# Patient Record
Sex: Male | Born: 1950 | Race: Black or African American | Hispanic: No | Marital: Single | Smoking: Current every day smoker
Health system: Southern US, Community
[De-identification: ages and names within clinical notes are randomized; demographics above are authoritative.]

## PROBLEM LIST (undated history)

## (undated) DIAGNOSIS — E119 Type 2 diabetes mellitus without complications: Secondary | ICD-10-CM

## (undated) DIAGNOSIS — I639 Cerebral infarction, unspecified: Secondary | ICD-10-CM

## (undated) DIAGNOSIS — I10 Essential (primary) hypertension: Secondary | ICD-10-CM

---

## 2020-03-28 ENCOUNTER — Encounter: Payer: Self-pay | Admitting: Critical Care Medicine

## 2020-03-29 NOTE — Progress Notes (Unsigned)
Patient ID: Reginald Rodriguez, male   DOB: February 06, 1951, 69 y.o.   MRN: [id number] This is a 69 year old male who is seen in the [facility] and is a new patient to our area.  [facility] the [facility] ago.  He moved up from [place] where his primary care provider is Dr. Tammi Klippel and he also has a diabetologist Pecola Leisure at [facility].  This patient has had longstanding diabetes hypertension previous stroke.  This patient states his current medications include Tresiba 60 units daily, Humalog 20 units 3 times daily as needed for blood sugars greater than 250, amlodipine 5 mg daily, Lipitor 10 mg daily, hydrocal thiazide 25 mg daily, gabapentin 100 mg twice daily, lactulose 15 mL daily, fenofibrate 54 mg daily.  The patient does not have a glucometer at this time.  He is allergic to penicillin.  He states he has had some headaches and some degree of constipation at this time.  On exam blood sugar is greater than 400 on meter saturation 98% pulse 79 blood pressure is 150/100  Chest is clear abdomen benign benign cardiac exam was unremarkable  Blood sugar greater than 400 on CBG  Impression is that of prior stroke, hypertension, diabetes type 2  Plan will be [facility] and I will obtain records from his clinic and send a record release which she signed at this visit we will also attempt a get him into our clinic for further follow-up

## 2020-04-15 ENCOUNTER — Ambulatory Visit: Payer: Medicare HMO | Attending: Critical Care Medicine | Admitting: Critical Care Medicine

## 2020-04-15 ENCOUNTER — Other Ambulatory Visit: Payer: Self-pay | Admitting: Critical Care Medicine

## 2020-04-15 ENCOUNTER — Ambulatory Visit (HOSPITAL_BASED_OUTPATIENT_CLINIC_OR_DEPARTMENT_OTHER): Payer: Medicare HMO | Admitting: Pharmacist

## 2020-04-15 ENCOUNTER — Encounter: Payer: Self-pay | Admitting: Critical Care Medicine

## 2020-04-15 ENCOUNTER — Other Ambulatory Visit: Payer: Self-pay

## 2020-04-15 VITALS — BP 125/86 | HR 94 | Temp 98.4°F | Resp 16 | Ht 67.0 in | Wt 231.6 lb

## 2020-04-15 DIAGNOSIS — E1165 Type 2 diabetes mellitus with hyperglycemia: Secondary | ICD-10-CM | POA: Diagnosis not present

## 2020-04-15 DIAGNOSIS — K089 Disorder of teeth and supporting structures, unspecified: Secondary | ICD-10-CM | POA: Diagnosis not present

## 2020-04-15 DIAGNOSIS — I10 Essential (primary) hypertension: Secondary | ICD-10-CM | POA: Insufficient documentation

## 2020-04-15 DIAGNOSIS — I6930 Unspecified sequelae of cerebral infarction: Secondary | ICD-10-CM | POA: Insufficient documentation

## 2020-04-15 DIAGNOSIS — Z1159 Encounter for screening for other viral diseases: Secondary | ICD-10-CM

## 2020-04-15 DIAGNOSIS — Z23 Encounter for immunization: Secondary | ICD-10-CM

## 2020-04-15 LAB — POCT GLYCOSYLATED HEMOGLOBIN (HGB A1C): HbA1c, POC (controlled diabetic range): 7.7 % — AB (ref 0.0–7.0)

## 2020-04-15 LAB — GLUCOSE, POCT (MANUAL RESULT ENTRY): POC Glucose: 138 mg/dl — AB (ref 70–99)

## 2020-04-15 MED ORDER — AMLODIPINE BESYLATE 5 MG PO TABS
5.0000 mg | ORAL_TABLET | Freq: Every day | ORAL | 1 refills | Status: DC
Start: 2020-04-15 — End: 2020-04-30

## 2020-04-15 MED ORDER — NOVOLOG FLEXPEN 100 UNIT/ML ~~LOC~~ SOPN
PEN_INJECTOR | SUBCUTANEOUS | Status: DC
Start: 2020-04-15 — End: 2020-05-15

## 2020-04-15 MED ORDER — ATORVASTATIN CALCIUM 10 MG PO TABS
10.0000 mg | ORAL_TABLET | Freq: Every day | ORAL | 2 refills | Status: DC
Start: 2020-04-15 — End: 2020-06-27

## 2020-04-15 MED ORDER — GABAPENTIN 100 MG PO CAPS
100.0000 mg | ORAL_CAPSULE | Freq: Two times a day (BID) | ORAL | 1 refills | Status: DC
Start: 2020-04-15 — End: 2020-06-05

## 2020-04-15 MED ORDER — CHLORTHALIDONE 25 MG PO TABS
25.0000 mg | ORAL_TABLET | Freq: Every day | ORAL | 1 refills | Status: DC
Start: 2020-04-15 — End: 2020-04-30

## 2020-04-15 MED ORDER — FENOFIBRATE 54 MG PO TABS
54.0000 mg | ORAL_TABLET | Freq: Every day | ORAL | 1 refills | Status: DC
Start: 2020-04-15 — End: 2020-06-27

## 2020-04-15 NOTE — Assessment & Plan Note (Signed)
Uncontrolled type 2 diabetes with hyperglycemia we will increase lunch dose to 24 units of NovoLog continue 20 units in the morning in the evening also continue Tresiba 60 units daily

## 2020-04-15 NOTE — Assessment & Plan Note (Signed)
Patient given resources for dental care

## 2020-04-15 NOTE — Progress Notes (Signed)
[id number]

## 2020-04-15 NOTE — Patient Instructions (Addendum)
Future refills on all your medicines will go to our pharmacy  Complete primary care blood draw done today and also urine for microalbumin  Change novolog to 20 units at breakfast, 24 units at lunch  20 units at dinner  Take tresiba at bedtime 60 units  Follow a healthy diet and reduce your tobacco intake  A Rollator will be obtained  Flu vaccine and pneumonia vaccine was given at this visit  Your Covid booster with Pfizer vaccine will be due in mid November  Return to see Dr. Joya Gaskins in this clinic 2 months   Tobacco Use Disorder Tobacco use disorder (TUD) occurs when a person craves, seeks, and uses tobacco, regardless of the consequences. This disorder can cause problems with mental and physical health. It can affect your ability to have healthy relationships, and it can keep you from meeting your responsibilities at work, home, or school. Tobacco may be:  Smoked as a cigarette or cigar.  Inhaled using e-cigarettes.  Smoked in a pipe or hookah.  Chewed as smokeless tobacco.  Inhaled into the nostrils as snuff. Tobacco products contain a dangerous chemical called nicotine, which is very addictive. Nicotine triggers hormones that make the body feel stimulated and works on areas of the brain that make you feel good. These effects can make it hard for people to quit nicotine. Tobacco contains many other unsafe chemicals that can damage almost every organ in the body. Smoking tobacco also puts others in danger due to fire risk and possible health problems caused by breathing in secondhand smoke. What are the signs or symptoms? Symptoms of TUD may include:  Being unable to slow down or stop your tobacco use.  Spending an abnormal amount of time getting or using tobacco.  Craving tobacco. Cravings may last for up to 6 months after quitting.  Tobacco use that: ? Interferes with your work, school, or home life. ? Interferes with your personal and social relationships. ? Makes  you give up activities that you once enjoyed or found important.  Using tobacco even though you know that it is: ? Dangerous or bad for your health or someone else's health. ? Causing problems in your life.  Needing more and more of the substance to get the same effect (developing tolerance).  Experiencing unpleasant symptoms if you do not use the substance (withdrawal). Withdrawal symptoms may include: ? Depressed, anxious, or irritable mood. ? Difficulty concentrating. ? Increased appetite. ? Restlessness or trouble sleeping.  Using the substance to avoid withdrawal. How is this diagnosed? This condition may be diagnosed based on:  Your current and past tobacco use. Your health care provider may ask questions about how your tobacco use affects your life.  A physical exam. You may be diagnosed with TUD if you have at least two symptoms within a 59-month period. How is this treated? This condition is treated by stopping tobacco use. Many people are unable to quit on their own and need help. Treatment may include:  Nicotine replacement therapy (NRT). NRT provides nicotine without the other harmful chemicals in tobacco. NRT gradually lowers the dosage of nicotine in the body and reduces withdrawal symptoms. NRT is available as: ? Over-the-counter gums, lozenges, and skin patches. ? Prescription mouth inhalers and nasal sprays.  Medicine that acts on the brain to reduce cravings and withdrawal symptoms.  A type of talk therapy that examines your triggers for tobacco use, how to avoid them, and how to cope with cravings (behavioral therapy).  Hypnosis. This may  help with withdrawal symptoms.  Joining a support group for others coping with TUD. The best treatment for TUD is usually a combination of medicine, talk therapy, and support groups. Recovery can be a long process. Many people start using tobacco again after stopping (relapse). If you relapse, it does not mean that treatment  will not work. Follow these instructions at home:  Lifestyle  Do not use any products that contain nicotine or tobacco, such as cigarettes and e-cigarettes.  Avoid things that trigger tobacco use as much as you can. Triggers include people and situations that usually cause you to use tobacco.  Avoid drinks that contain caffeine, including coffee. These may worsen some withdrawal symptoms.  Find ways to manage stress. Wanting to smoke may cause stress, and stress can make you want to smoke. Relaxation techniques such as deep breathing, meditation, and yoga may help.  Attend support groups as needed. These groups are an important part of long-term recovery for many people. General instructions  Take over-the-counter and prescription medicines only as told by your health care provider.  Check with your health care provider before taking any new prescription or over-the-counter medicines.  Decide on a friend, family member, or smoking quit-line (such as 1-800-QUIT-NOW in the [place] that you can call or text when you feel the urge to smoke or when you need help coping with cravings.  Keep all follow-up visits as told by your health care provider and therapist. This is important. Contact a health care provider if:  You are not able to take your medicines as prescribed.  Your symptoms get worse, even with treatment. Summary  Tobacco use disorder (TUD) occurs when a person craves, seeks, and uses tobacco regardless of the consequences.  This condition may be diagnosed based on your current and past tobacco use and a physical exam.  Many people are unable to quit on their own and need help. Recovery can be a long process.  The most effective treatment for TUD is usually a combination of medicine, talk therapy, and support groups. This information is not intended to replace advice given to you by your health care provider. Make sure you discuss any questions you have with your health care  provider. Document Revised: 06/23/2017 Document Reviewed: 06/23/2017 Elsevier Patient Education  [place].  Diabetes Mellitus and Nutrition, Adult When you have diabetes (diabetes mellitus), it is very important to have healthy eating habits because your blood sugar (glucose) levels are greatly affected by what you eat and drink. Eating healthy foods in the appropriate amounts, at about the same times every day, can help you:  Control your blood glucose.  Lower your risk of heart disease.  Improve your blood pressure.  Reach or maintain a healthy weight. Every person with diabetes is different, and each person has different needs for a meal plan. Your health care provider may recommend that you work with a diet and nutrition specialist (dietitian) to make a meal plan that is best for you. Your meal plan may vary depending on factors such as:  The calories you need.  The medicines you take.  Your weight.  Your blood glucose, blood pressure, and cholesterol levels.  Your activity level.  Other health conditions you have, such as heart or kidney disease. How do carbohydrates affect me? Carbohydrates, also called carbs, affect your blood glucose level more than any other type of food. Eating carbs naturally raises the amount of glucose in your blood. Carb counting is a method for  keeping track of how many carbs you eat. Counting carbs is important to keep your blood glucose at a healthy level, especially if you use insulin or take certain oral diabetes medicines. It is important to know how many carbs you can safely have in each meal. This is different for every person. Your dietitian can help you calculate how many carbs you should have at each meal and for each snack. Foods that contain carbs include:  Bread, cereal, rice, pasta, and crackers.  Potatoes and corn.  Peas, beans, and lentils.  Milk and yogurt.  Fruit and juice.  Desserts, such as cakes, cookies, ice  cream, and candy. How does alcohol affect me? Alcohol can cause a sudden decrease in blood glucose (hypoglycemia), especially if you use insulin or take certain oral diabetes medicines. Hypoglycemia can be a life-threatening condition. Symptoms of hypoglycemia (sleepiness, dizziness, and confusion) are similar to symptoms of having too much alcohol. If your health care provider says that alcohol is safe for you, follow these guidelines:  Limit alcohol intake to no more than 1 drink per day for nonpregnant women and 2 drinks per day for men. One drink equals 12 oz of beer, 5 oz of wine, or 1 oz of hard liquor.  Do not drink on an empty stomach.  Keep yourself hydrated with water, diet soda, or unsweetened iced tea.  Keep in mind that regular soda, juice, and other mixers may contain a lot of sugar and must be counted as carbs. What are tips for following this plan?  Reading food labels  Start by checking the serving size on the "Nutrition Facts" label of packaged foods and drinks. The amount of calories, carbs, fats, and other nutrients listed on the label is based on one serving of the item. Many items contain more than one serving per package.  Check the total grams (g) of carbs in one serving. You can calculate the number of servings of carbs in one serving by dividing the total carbs by 15. For example, if a food has 30 g of total carbs, it would be equal to 2 servings of carbs.  Check the number of grams (g) of saturated and trans fats in one serving. Choose foods that have low or no amount of these fats.  Check the number of milligrams (mg) of salt (sodium) in one serving. Most people should limit total sodium intake to less than 2,300 mg per day.  Always check the nutrition information of foods labeled as "low-fat" or "nonfat". These foods may be higher in added sugar or refined carbs and should be avoided.  Talk to your dietitian to identify your daily goals for nutrients listed on  the label. Shopping  Avoid buying canned, premade, or processed foods. These foods tend to be high in fat, sodium, and added sugar.  Shop around the outside edge of the grocery store. This includes fresh fruits and vegetables, bulk grains, fresh meats, and fresh dairy. Cooking  Use low-heat cooking methods, such as baking, instead of high-heat cooking methods like deep frying.  Cook using healthy oils, such as olive, canola, or sunflower oil.  Avoid cooking with butter, cream, or high-fat meats. Meal planning  Eat meals and snacks regularly, preferably at the same times every day. Avoid going long periods of time without eating.  Eat foods high in fiber, such as fresh fruits, vegetables, beans, and whole grains. Talk to your dietitian about how many servings of carbs you can eat at each meal.  Eat 4-6 ounces (oz) of lean protein each day, such as lean meat, chicken, fish, eggs, or tofu. One oz of lean protein is equal to: ? 1 oz of meat, chicken, or fish. ? 1 egg. ?  cup of tofu.  Eat some foods each day that contain healthy fats, such as avocado, nuts, seeds, and fish. Lifestyle  Check your blood glucose regularly.  Exercise regularly as told by your health care provider. This may include: ? 150 minutes of moderate-intensity or vigorous-intensity exercise each week. This could be brisk walking, biking, or water aerobics. ? Stretching and doing strength exercises, such as yoga or weightlifting, at least 2 times a week.  Take medicines as told by your health care provider.  Do not use any products that contain nicotine or tobacco, such as cigarettes and e-cigarettes. If you need help quitting, ask your health care provider.  Work with a Social worker or diabetes educator to identify strategies to manage stress and any emotional and social challenges. Questions to ask a health care provider  Do I need to meet with a diabetes educator?  Do I need to meet with a dietitian?  What  number can I call if I have questions?  When are the best times to check my blood glucose? Where to find more information:  American Diabetes Association: diabetes.org  Academy of Nutrition and Dietetics: www.eatright.[employer] of Diabetes and Digestive and Kidney Diseases (NIH): [employer] Summary  A healthy meal plan will help you control your blood glucose and maintain a healthy lifestyle.  Working with a diet and nutrition specialist (dietitian) can help you make a meal plan that is best for you.  Keep in mind that carbohydrates (carbs) and alcohol have immediate effects on your blood glucose levels. It is important to count carbs and to use alcohol carefully. This information is not intended to replace advice given to you by your health care provider. Make sure you discuss any questions you have with your health care provider. Document Revised: 06/18/2017 Document Reviewed: 08/10/2016 Elsevier Patient Education  [place].  Pneumococcal Polysaccharide Vaccine (PPSV23): What You Need to Know 1. Why get vaccinated? Pneumococcal polysaccharide vaccine (PPSV23) can prevent pneumococcal disease. Pneumococcal disease refers to any illness caused by pneumococcal bacteria. These bacteria can cause many types of illnesses, including pneumonia, which is an infection of the lungs. Pneumococcal bacteria are one of the most common causes of pneumonia. Besides pneumonia, pneumococcal bacteria can also cause:  Ear infections  Sinus infections  Meningitis (infection of the tissue covering the brain and spinal cord)  Bacteremia (bloodstream infection) Anyone can get pneumococcal disease, but children under 48 years of age, people with certain medical conditions, adults 72 years or older, and cigarette smokers are at the highest risk. Most pneumococcal infections are mild. However, some can result in long-term problems, such as brain damage or hearing loss.  Meningitis, bacteremia, and pneumonia caused by pneumococcal disease can be fatal. 2. PPSV23 PPSV23 protects against 23 types of bacteria that cause pneumococcal disease. PPSV23 is recommended for:  All adults 13 years or older,  Anyone 2 years or older with certain medical conditions that can lead to an increased risk for pneumococcal disease. Most people need only one dose of PPSV23. A second dose of PPSV23, and another type of pneumococcal vaccine called PCV13, are recommended for certain high-risk groups. Your health care provider can give you more information. People 65 years or older should get a dose of PPSV23 even if  they have already gotten one or more doses of the vaccine before they turned 73. 3. Talk with your health care provider Tell your vaccine provider if the person getting the vaccine:  Has had an allergic reaction after a previous dose of PPSV23, or has any severe, life-threatening allergies. In some cases, your health care provider may decide to postpone PPSV23 vaccination to a future visit. People with minor illnesses, such as a cold, may be vaccinated. People who are moderately or severely ill should usually wait until they recover before getting PPSV23. Your health care provider can give you more information. 4. Risks of a vaccine reaction  Redness or pain where the shot is given, feeling tired, fever, or muscle aches can happen after PPSV23. People sometimes faint after medical procedures, including vaccination. Tell your provider if you feel dizzy or have vision changes or ringing in the ears. As with any medicine, there is a very remote chance of a vaccine causing a severe allergic reaction, other serious injury, or death. 5. What if there is a serious problem? An allergic reaction could occur after the vaccinated person leaves the clinic. If you see signs of a severe allergic reaction (hives, swelling of the face and throat, difficulty breathing, a fast heartbeat,  dizziness, or weakness), call 9-1-1 and get the person to the nearest hospital. For other signs that concern you, call your health care provider. Adverse reactions should be reported to the Vaccine Adverse Event Reporting System (VAERS). Your health care provider will usually file this report, or you can do it yourself. Visit the VAERS website at www.vaers.[employer].es or call 559-261-6610. VAERS is only for reporting reactions, and VAERS staff do not give medical advice. 6. How can I learn more?  Ask your health care provider.  Call your local or state health department.  Contact the Centers for Disease Control and Prevention (CDC): ? Call (712)838-7553 (1-800-CDC-INFO) or ? Visit CDC's website at http://hunter.com/ CDC Vaccine Information Statement PPSV23 Vaccine (05/18/2018) This information is not intended to replace advice given to you by your health care provider. Make sure you discuss any questions you have with your health care provider. Document Revised: 10/25/2018 Document Reviewed: 02/15/2018 Elsevier Patient Education  [place].   Influenza Virus Vaccine injection (Fluarix) What is this medicine? INFLUENZA VIRUS VACCINE (in floo EN zuh VAHY ruhs vak SEEN) helps to reduce the risk of getting influenza also known as the flu. This medicine may be used for other purposes; ask your health care provider or pharmacist if you have questions. COMMON BRAND NAME(S): Fluarix, Fluzone What should I tell my health care provider before I take this medicine? They need to know if you have any of these conditions: bleeding disorder like hemophilia fever or infection Guillain-Barre syndrome or other neurological problems immune system problems infection with the human immunodeficiency virus (HIV) or AIDS low blood platelet counts multiple sclerosis an unusual or allergic reaction to influenza virus vaccine, eggs, chicken proteins, latex, gentamicin, other medicines, foods, dyes or  preservatives pregnant or trying to get pregnant breast-feeding How should I use this medicine? This vaccine is for injection into a muscle. It is given by a health care professional. A copy of Vaccine Information Statements will be given before each vaccination. Read this sheet carefully each time. The sheet may change frequently. Talk to your pediatrician regarding the use of this medicine in children. Special care may be needed. Overdosage: If you think you have taken too much of this medicine contact a  poison control center or emergency room at once. NOTE: This medicine is only for you. Do not share this medicine with others. What if I miss a dose? This does not apply. What may interact with this medicine? chemotherapy or radiation therapy medicines that lower your immune system like etanercept, anakinra, infliximab, and adalimumab medicines that treat or prevent blood clots like warfarin phenytoin steroid medicines like prednisone or cortisone theophylline vaccines This list may not describe all possible interactions. Give your health care provider a list of all the medicines, herbs, non-prescription drugs, or dietary supplements you use. Also tell them if you smoke, drink alcohol, or use illegal drugs. Some items may interact with your medicine. What should I watch for while using this medicine? Report any side effects that do not go away within 3 days to your doctor or health care professional. Call your health care provider if any unusual symptoms occur within 6 weeks of receiving this vaccine. You may still catch the flu, but the illness is not usually as bad. You cannot get the flu from the vaccine. The vaccine will not protect against colds or other illnesses that may cause fever. The vaccine is needed every year. What side effects may I notice from receiving this medicine? Side effects that you should report to your doctor or health care professional as soon as possible: allergic  reactions like skin rash, itching or hives, swelling of the face, lips, or tongue Side effects that usually do not require medical attention (report to your doctor or health care professional if they continue or are bothersome): fever headache muscle aches and pains pain, tenderness, redness, or swelling at site where injected weak or tired This list may not describe all possible side effects. Call your doctor for medical advice about side effects. You may report side effects to FDA at 1-800-FDA-1088. Where should I keep my medicine? This vaccine is only given in a clinic, pharmacy, doctor's office, or other health care setting and will not be stored at home. NOTE: This sheet is a summary. It may not cover all possible information. If you have questions about this medicine, talk to your doctor, pharmacist, or health care provider.  2020 Elsevier/Gold Standard (2008-02-01 09:30:40)

## 2020-04-15 NOTE — Assessment & Plan Note (Signed)
Hypertension well-controlled this time continue current therapy

## 2020-04-15 NOTE — Progress Notes (Signed)
Subjective:    Patient ID: Reginald Rodriguez, male    DOB: [birth year], 68 y.o.   MRN: [id number]  03/28/20 This is a 69 year old male who is seen in the [facility] and is a new patient to our area.  He just arrived at the shelter 2 weeks ago.  He moved up from [place] where his primary care provider is Dr. Tammi Klippel and he also has a diabetologist Pecola Leisure at the [facility].  This patient has had longstanding diabetes hypertension previous stroke.  This patient states his current medications include Tresiba 60 units daily, Humalog 20 units 3 times daily as needed for blood sugars greater than 250, amlodipine 5 mg daily, Lipitor 10 mg daily, hydrocal thiazide 25 mg daily, gabapentin 100 mg twice daily, lactulose 15 mL daily, fenofibrate 54 mg daily.  The patient does not have a glucometer at this time.  He is allergic to penicillin.  He states he has had some headaches and some degree of constipation at this time.  On exam blood sugar is greater than 400 on meter saturation 98% pulse 79 blood pressure is 150/100  Chest is clear abdomen benign benign cardiac exam was unremarkable  Blood sugar greater than 400 on CBG  Impression is that of prior stroke, hypertension, diabetes type 2  Plan will be [facility] and I will obtain records from his clinic and send a record release which she signed at this visit we will also attempt a get him into our clinic for further follow-up  04/15/2020 Patient is seen today in the clinic for the first time having just moved from [place].  On arrival A1c is 7.7 blood sugar 138.  Longstanding history of hypertension diabetes prior stroke 3 years ago with residual defect with right upper and lower extremity weakness.  On arrival blood pressure is good at 125/86.  Patient uses a cane to ambulate is requesting a Rollator possible.  Patient needs primary care assessments including  hepatitis C assay.  He did have a colonoscopy 3 years ago was negative.  He smokes a pack every 2 days at the shelter.  He has no other new complaints.  History reviewed. No pertinent past medical history.   History reviewed. No pertinent family history.   Social History   Socioeconomic History  . Marital status: Single    Spouse name: Not on file  . Number of children: Not on file  . Years of education: Not on file  . Highest education level: Not on file  Occupational History  . Not on file  Tobacco Use  . Smoking status: Current Every Day Smoker  . Smokeless tobacco: Former Network engineer  . Vaping Use: Never used  Substance and Sexual Activity  . Alcohol use: Not Currently  . Drug use: Not Currently  . Sexual activity: Not on file  Other Topics Concern  . Not on file  Social History Narrative  . Not on file   Social Determinants of Health   Financial Resource Strain:   . Difficulty of Paying Living Expenses: Not on file  Food Insecurity:   . Worried About Charity fundraiser in the Last Year: Not on file  . Ran Out of Food in the Last Year: Not on file  Transportation Needs:   . Lack of Transportation (Medical): Not on file  . Lack of Transportation (Non-Medical): Not on file  Physical Activity:   .  Days of Exercise per Week: Not on file  . Minutes of Exercise per Session: Not on file  Stress:   . Feeling of Stress : Not on file  Social Connections:   . Frequency of Communication with Friends and Family: Not on file  . Frequency of Social Gatherings with Friends and Family: Not on file  . Attends Religious Services: Not on file  . Active Member of Clubs or Organizations: Not on file  . Attends Archivist Meetings: Not on file  . Marital Status: Not on file  Intimate Partner Violence:   . Fear of Current or Ex-Partner: Not on file  . Emotionally Abused: Not on file  . Physically Abused: Not on file  . Sexually Abused: Not on file     Allergies   Allergen Reactions  . Bee Venom Swelling  . Penicillins Rash     Outpatient Medications Prior to Visit  Medication Sig Dispense Refill  . ACCU-CHEK GUIDE test strip USE TO check blood sugar 2 TIMES DAILY    . Accu-Chek Softclix Lancets lancets 2 (two) times daily.    Marland Kitchen aspirin 81 MG EC tablet     . Blood Glucose Monitoring Suppl (ACCU-CHEK GUIDE) w/Device KIT SMARTSIG:Via Meter    . lactulose (CHRONULAC) 10 GM/15ML solution Take 10 g by mouth daily.    [name] FLEXTOUCH 100 UNIT/ML FlexTouch Pen Inject 60 Units into the skin at bedtime.     Marland Kitchen UNIFINE PENTIPS PLUS 32G X 4 MM MISC USE 4 TIMES DAILY AS DIRECTED    . amLODipine (NORVASC) 5 MG tablet Take 5 mg by mouth daily.    Marland Kitchen atorvastatin (LIPITOR) 10 MG tablet Take 10 mg by mouth daily.    . chlorthalidone (HYGROTON) 25 MG tablet     . fenofibrate 54 MG tablet Take 54 mg by mouth daily.    Marland Kitchen gabapentin (NEURONTIN) 100 MG capsule Take 100 mg by mouth 2 (two) times daily.    Marland Kitchen NOVOLOG FLEXPEN 100 UNIT/ML FlexPen Inject 20 Units into the skin 3 (three) times daily with meals.     . Dulaglutide (TRULICITY) 6.60 YT/0.1SW SOPN  (Patient not taking: Reported on 04/15/2020)    . hydrochlorothiazide (HYDRODIURIL) 25 MG tablet Take 25 mg by mouth daily. (Patient not taking: Reported on 04/15/2020)    . valsartan (DIOVAN) 320 MG tablet  (Patient not taking: Reported on 04/15/2020)     No facility-administered medications prior to visit.      Review of Systems Constitutional:   No  weight loss, night sweats,  Fevers, chills, fatigue, lassitude. HEENT:   No headaches,  Difficulty swallowing,  Tooth/dental problems,  Sore throat,                No sneezing, itching, ear ache, nasal congestion, post nasal drip,   CV:  No chest pain,  Orthopnea, PND, swelling in lower extremities, anasarca, dizziness, palpitations  GI  No heartburn, indigestion, abdominal pain, nausea, vomiting, diarrhea, change in bowel habits, loss of appetite  Resp: No  shortness of breath with exertion or at rest.  No excess mucus, no productive cough,  No non-productive cough,  No coughing up of blood.  No change in color of mucus.  No wheezing.  No chest wall deformity  Skin: no rash or lesions.  GU: no dysuria, change in color of urine, no urgency or frequency.  No flank pain.  MS:  No joint pain or swelling.  No decreased range of motion.  No back pain.  Psych:  No change in mood or affect. No depression or anxiety.  No memory loss.     Objective:   Physical Exam Vitals:   04/15/20 1031  BP: 125/86  Pulse: 94  Resp: 16  Temp: 98.4 F (36.9 C)  SpO2: 97%  Weight: 231 lb 9.6 oz (105.1 kg)  Height: _0  (1.702 m)    Gen: Pleasant, well-nourished, in no distress,  normal affect  ENT: No lesions,  mouth clear,  oropharynx clear, no postnasal drip  Neck: No JVD, no TMG, no carotid bruits  Lungs: No use of accessory muscles, no dullness to percussion, clear without rales or rhonchi  Cardiovascular: RRR, heart sounds normal, no murmur or gallops, no peripheral edema  Abdomen: soft and NT, no HSM,  BS normal  Musculoskeletal: No deformities, no cyanosis or clubbing  Neuro: alert, non focal  Skin: Warm, no lesions or rashes  No results found.        Assessment & Plan:  I personally reviewed all images and lab data in the [facility] system as well as any outside material available during this office visit and agree with the  radiology impressions.   Essential hypertension Hypertension well-controlled this time continue current therapy  Poor dentition Patient given resources for dental care  Uncontrolled type 2 diabetes mellitus with hyperglycemia ([place]) Uncontrolled type 2 diabetes with hyperglycemia we will increase lunch dose to 24 units of NovoLog continue 20 units in the morning in the evening also continue Tresiba 60 units daily  H/O: stroke with residual effects Still partial weakness on the right side will obtain a Rollator  assist with ambulation   Hope was seen today for new patient (initial visit).  Diagnoses and all orders for this visit:  Uncontrolled type 2 diabetes mellitus with hyperglycemia (HCC) -     POCT glucose (manual entry) -     POCT glycosylated hemoglobin (Hb A1C) -     Microalbumin / creatinine urine ratio -     Comprehensive metabolic panel -     CBC with Differential/Platelet  Need for hepatitis C screening test -     HCV Ab w/Rflx to Verification  H/O: stroke with residual effects -     For home use only DME Other see comment  Poor dentition  Essential hypertension  Other orders -     chlorthalidone (HYGROTON) 25 MG tablet; Take 1 tablet (25 mg total) by mouth daily. -     atorvastatin (LIPITOR) 10 MG tablet; Take 1 tablet (10 mg total) by mouth daily. -     gabapentin (NEURONTIN) 100 MG capsule; Take 1 capsule (100 mg total) by mouth 2 (two) times daily. -     amLODipine (NORVASC) 5 MG tablet; Take 1 tablet (5 mg total) by mouth daily. -     fenofibrate 54 MG tablet; Take 1 tablet (54 mg total) by mouth daily. -     NOVOLOG FLEXPEN 100 UNIT/ML FlexPen; Use 20 units at breakfast   24 units at lunch 20 units at dinner   Refills on all medications given hepatitis C assay given flu shot was also given  Patient is already received his Covid vaccine series

## 2020-04-15 NOTE — Assessment & Plan Note (Signed)
Still partial weakness on the right side will obtain a Rollator assist with ambulation

## 2020-04-15 NOTE — Progress Notes (Signed)
Patient presents for vaccination against strep pneumo and influenza per orders of Dr. Joya Gaskins. Consent given. Counseling provided. No contraindications exists. Vaccine administered without incident.   Benard Halsted, PharmD, [place] [phone]

## 2020-04-16 ENCOUNTER — Encounter: Payer: Self-pay | Admitting: Critical Care Medicine

## 2020-04-16 DIAGNOSIS — N183 Chronic kidney disease, stage 3 unspecified: Secondary | ICD-10-CM | POA: Insufficient documentation

## 2020-04-22 ENCOUNTER — Other Ambulatory Visit: Payer: Self-pay | Admitting: Critical Care Medicine

## 2020-04-22 LAB — COMPREHENSIVE METABOLIC PANEL
ALT: 24 IU/L (ref 0–44)
AST: 19 IU/L (ref 0–40)
Albumin/Globulin Ratio: 1.6 (ref 1.2–2.2)
Albumin: 4.5 g/dL (ref 3.8–4.8)
Alkaline Phosphatase: 51 IU/L (ref 44–121)
BUN/Creatinine Ratio: 14 (ref 10–24)
BUN: 20 mg/dL (ref 8–27)
Bilirubin Total: 0.2 mg/dL (ref 0.0–1.2)
CO2: 28 mmol/L (ref 20–29)
Calcium: 10.1 mg/dL (ref 8.6–10.2)
Chloride: 101 mmol/L (ref 96–106)
Creatinine, Ser: 1.46 mg/dL — ABNORMAL HIGH (ref 0.76–1.27)
GFR calc Af Amer: 57 mL/min/{1.73_m2} — ABNORMAL LOW (ref >59)
GFR calc non Af Amer: 49 mL/min/{1.73_m2} — ABNORMAL LOW (ref >59)
Globulin, Total: 2.8 g/dL (ref 1.5–4.5)
Glucose: 105 mg/dL — ABNORMAL HIGH (ref 65–99)
Potassium: 3.9 mmol/L (ref 3.5–5.2)
Sodium: 140 mmol/L (ref 134–144)
Total Protein: 7.3 g/dL (ref 6.0–8.5)

## 2020-04-22 LAB — CBC WITH DIFFERENTIAL/PLATELET
Basophils Absolute: 0.1 10*3/uL (ref 0.0–0.2)
Basos: 1 %
EOS (ABSOLUTE): 0.4 10*3/uL (ref 0.0–0.4)
Eos: 4 %
Hematocrit: 43.1 % (ref 37.5–51.0)
Hemoglobin: 14.7 g/dL (ref 13.0–17.7)
Immature Grans (Abs): 0 10*3/uL (ref 0.0–0.1)
Immature Granulocytes: 0 %
Lymphocytes Absolute: 4.4 10*3/uL — ABNORMAL HIGH (ref 0.7–3.1)
Lymphs: 43 %
MCH: 30.6 pg (ref 26.6–33.0)
MCHC: 34.1 g/dL (ref 31.5–35.7)
MCV: 90 fL (ref 79–97)
Monocytes Absolute: 0.9 10*3/uL (ref 0.1–0.9)
Monocytes: 9 %
Neutrophils Absolute: 4.4 10*3/uL (ref 1.4–7.0)
Neutrophils: 43 %
Platelets: 333 10*3/uL (ref 150–450)
RBC: 4.8 x10E6/uL (ref 4.14–5.80)
RDW: 13.8 % (ref 11.6–15.4)
WBC: 10.2 10*3/uL (ref 3.4–10.8)

## 2020-04-22 LAB — MICROALBUMIN / CREATININE URINE RATIO
Creatinine, Urine: 151.2 mg/dL
Microalb/Creat Ratio: 3 mg/g creat (ref 0–29)
Microalbumin, Urine: 5.1 ug/mL

## 2020-04-22 LAB — HCV AB W/RFLX TO VERIFICATION: HCV Ab: >11 s/co ratio — ABNORMAL HIGH (ref 0.0–0.9)

## 2020-04-22 LAB — HCV RNA NAA QUALITATIVE: HCV RNA NAA QUALITATIVE: NEGATIVE

## 2020-04-22 LAB — INTERPRETATION

## 2020-04-22 MED ORDER — ACCU-CHEK GUIDE VI STRP
ORAL_STRIP | 1 refills | Status: DC
Start: 2020-04-22 — End: 2020-06-05

## 2020-04-22 MED FILL — GABAPENTIN 100 MG CAPSULE: 100 | 30 days supply | Qty: 60 | Fill #0

## 2020-04-22 MED FILL — CHLORTHALIDONE 25 MG TAB: 25 | 90 days supply | Qty: 90 | Fill #0

## 2020-04-22 MED FILL — ACCU-CHEK GUIDE TEST STRIP: 50 days supply | Qty: 100 | Fill #0

## 2020-04-22 MED FILL — AMLODIPINE BESYLATE 5 MG TA: 5 | 90 days supply | Qty: 90 | Fill #0

## 2020-04-22 MED FILL — FENOFIBRATE 54 MG TABLET: 54 | 90 days supply | Qty: 90 | Fill #0

## 2020-04-22 MED FILL — ATORVASTATIN 10 MG TABLET: 10 | 90 days supply | Qty: 90 | Fill #0

## 2020-04-22 NOTE — Telephone Encounter (Signed)
Pt called and stated that he received refills for his meds but did not receive refill for ACCU-CHEK GUIDE test strip / Pt would like these to pick up today after 2pm with his other meds / please advise

## 2020-04-22 NOTE — Telephone Encounter (Signed)
Please send a currenty prescription by the current provider to pharmacy. Current prescription on file is by historical provider.   LOV  04/15/20  Next OV  06/19/20.

## 2020-04-26 ENCOUNTER — Other Ambulatory Visit: Payer: Self-pay | Admitting: Critical Care Medicine

## 2020-04-26 MED ORDER — TRESIBA FLEXTOUCH 100 UNIT/ML ~~LOC~~ SOPN
60.0000 [IU] | PEN_INJECTOR | Freq: Every day | SUBCUTANEOUS | 0 refills | Status: DC
Start: 2020-04-26 — End: 2020-06-05

## 2020-04-26 MED ORDER — ACCU-CHEK GUIDE W/DEVICE KIT
PACK | 0 refills | Status: DC
Start: 2020-04-26 — End: 2020-10-15

## 2020-04-26 MED FILL — ACCU-CHEK GUIDE W/DEVICE KI: W/DEVICE | 1 days supply | Qty: 1 | Fill #0

## 2020-04-26 MED FILL — TRESIBA FLEXTOUCH 100 UNITS: 100 | 20 days supply | Qty: 12 | Fill #0

## 2020-04-30 ENCOUNTER — Other Ambulatory Visit: Payer: Self-pay | Admitting: Critical Care Medicine

## 2020-04-30 MED ORDER — CHLORTHALIDONE 25 MG PO TABS
25.0000 mg | ORAL_TABLET | Freq: Every day | ORAL | 1 refills | Status: DC
Start: 2020-04-30 — End: 2020-06-27

## 2020-04-30 MED ORDER — AMLODIPINE BESYLATE 5 MG PO TABS
5.0000 mg | ORAL_TABLET | Freq: Every day | ORAL | 1 refills | Status: DC
Start: 2020-04-30 — End: 2020-06-27

## 2020-04-30 NOTE — Progress Notes (Signed)
refills  

## 2020-05-01 ENCOUNTER — Other Ambulatory Visit: Payer: Self-pay | Admitting: Critical Care Medicine

## 2020-05-01 ENCOUNTER — Encounter: Payer: Self-pay | Admitting: Critical Care Medicine

## 2020-05-01 MED ORDER — BUPROPION HCL ER (XL) 150 MG PO TB24: 150.0000 mg | ORAL_TABLET | Freq: Every day | ORAL | 1 refills | Status: DC

## 2020-05-01 MED ORDER — NICOTINE 21 MG/24HR TD PT24: 21.0000 mg | MEDICATED_PATCH | Freq: Every day | TRANSDERMAL | 0 refills | Status: DC

## 2020-05-01 MED FILL — BUPROPION HCL XL 150 MG TAB: 150 | 30 days supply | Qty: 30 | Fill #0

## 2020-05-01 NOTE — Progress Notes (Signed)
Patient ID: Reginald Rodriguez, male   DOB: 10-28-1950, 69 y.o.   MRN: [id number] This patient is seen briefly today at the [facility] clinic I went over his medications and medication reconciliation for him.  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

## 2020-05-13 ENCOUNTER — Encounter: Payer: Self-pay | Admitting: Critical Care Medicine

## 2020-05-13 DIAGNOSIS — B351 Tinea unguium: Secondary | ICD-10-CM | POA: Insufficient documentation

## 2020-05-13 DIAGNOSIS — M1990 Unspecified osteoarthritis, unspecified site: Secondary | ICD-10-CM | POA: Insufficient documentation

## 2020-05-13 DIAGNOSIS — G4733 Obstructive sleep apnea (adult) (pediatric): Secondary | ICD-10-CM | POA: Insufficient documentation

## 2020-05-13 DIAGNOSIS — K760 Fatty (change of) liver, not elsewhere classified: Secondary | ICD-10-CM | POA: Insufficient documentation

## 2020-05-13 DIAGNOSIS — I714 Abdominal aortic aneurysm, without rupture, unspecified: Secondary | ICD-10-CM | POA: Insufficient documentation

## 2020-05-13 DIAGNOSIS — E1169 Type 2 diabetes mellitus with other specified complication: Secondary | ICD-10-CM | POA: Insufficient documentation

## 2020-05-13 DIAGNOSIS — J439 Emphysema, unspecified: Secondary | ICD-10-CM | POA: Insufficient documentation

## 2020-05-13 DIAGNOSIS — F1411 Cocaine abuse, in remission: Secondary | ICD-10-CM | POA: Insufficient documentation

## 2020-05-13 DIAGNOSIS — N529 Male erectile dysfunction, unspecified: Secondary | ICD-10-CM | POA: Insufficient documentation

## 2020-05-13 DIAGNOSIS — G4731 Primary central sleep apnea: Secondary | ICD-10-CM | POA: Insufficient documentation

## 2020-05-15 ENCOUNTER — Other Ambulatory Visit: Payer: Self-pay | Admitting: Critical Care Medicine

## 2020-05-15 ENCOUNTER — Encounter: Payer: Self-pay | Admitting: Critical Care Medicine

## 2020-05-15 MED ORDER — UNIFINE PENTIPS PLUS 32G X 4 MM MISC
1 refills | Status: DC
Start: 2020-05-15 — End: 2020-05-29

## 2020-05-15 MED ORDER — NOVOLOG FLEXPEN 100 UNIT/ML ~~LOC~~ SOPN
PEN_INJECTOR | SUBCUTANEOUS | 3 refills | Status: DC
Start: 2020-05-15 — End: 2020-06-05

## 2020-05-16 MED FILL — UNIFINE PENTIPS 32GX5/32: 32G X 4 MM | 100 days supply | Qty: 100 | Fill #0

## 2020-05-16 MED FILL — NOVOLOG FLEXPEN SYRINGE: 100 | 23 days supply | Qty: 15 | Fill #0

## 2020-05-16 NOTE — Progress Notes (Signed)
Patient ID: Reginald Rodriguez, male   DOB: April 09, 1951, 69 y.o.   MRN: [id number] This is 69 year old male with type 2 diabetes hypertension previous stroke abdominal aneurysm fatty liver prior alcohol use today I seen in the [facility] clinic because he is out of pen needles and running out of insulin  I did not do a full exam on this patient but he has an [facility] for today I sent prescriptions for his insulin refills and pen needle refills

## 2020-05-29 ENCOUNTER — Encounter: Payer: Self-pay | Admitting: Critical Care Medicine

## 2020-05-29 ENCOUNTER — Other Ambulatory Visit (HOSPITAL_COMMUNITY): Payer: Self-pay | Admitting: Critical Care Medicine

## 2020-05-29 ENCOUNTER — Other Ambulatory Visit: Payer: Self-pay | Admitting: Critical Care Medicine

## 2020-05-29 MED ORDER — UNIFINE PENTIPS PLUS 32G X 4 MM MISC
1 refills | Status: DC
Start: 2020-05-29 — End: 2020-06-27

## 2020-05-29 MED FILL — UNIFINE PENTIPS 32GX5/32: 32G X 4 MM | 25 days supply | Qty: 100 | Fill #0

## 2020-05-29 NOTE — Progress Notes (Signed)
This is a 69 year old male with diabetes seen in the [place] clinic.  He states his blood glucoses have been running higher more recently.  He has been running 150 in the morning and 290 in the evening on arrival his blood glucose was 245 at this visit  On exam blood pressure is 120/80 pulse is 102 saturation 97% room air blood glucose is 245  Impression is that of type 2 diabetes with hyperglycemia  Plan is to increase his NovoLog dosing but continue Tresiba at its current dose level  The NovoLog will now be 25 units at breakfast, 30 units at lunch and 20 units at dinner [name] will maintain at 60 units daily no other changes in medications made

## 2020-06-05 ENCOUNTER — Other Ambulatory Visit: Payer: Self-pay | Admitting: Critical Care Medicine

## 2020-06-05 ENCOUNTER — Encounter: Payer: Self-pay | Admitting: Critical Care Medicine

## 2020-06-05 MED ORDER — ACCU-CHEK GUIDE VI STRP: ORAL_STRIP | 1 refills | Status: DC

## 2020-06-05 MED ORDER — TRESIBA FLEXTOUCH 100 UNIT/ML ~~LOC~~ SOPN: 60.0000 [IU] | PEN_INJECTOR | Freq: Every day | SUBCUTANEOUS | 0 refills | Status: DC

## 2020-06-05 MED ORDER — NOVOLOG FLEXPEN 100 UNIT/ML ~~LOC~~ SOPN: PEN_INJECTOR | SUBCUTANEOUS | 3 refills | Status: DC

## 2020-06-05 MED ORDER — GABAPENTIN 100 MG PO CAPS
100.0000 mg | ORAL_CAPSULE | Freq: Two times a day (BID) | ORAL | 1 refills | Status: DC
Start: 2020-06-05 — End: 2020-08-19

## 2020-06-05 MED FILL — GABAPENTIN 100 MG CAPSULE: 100 | 30 days supply | Qty: 60 | Fill #0

## 2020-06-05 MED FILL — TRESIBA FLEXTOUCH 100 UNITS: 100 | 20 days supply | Qty: 12 | Fill #0

## 2020-06-05 MED FILL — ACCU-CHEK GUIDE TEST STRIP: 50 days supply | Qty: 100 | Fill #0

## 2020-06-06 NOTE — Progress Notes (Signed)
This is a 69 year old male seen at the [facility] clinic here to follow-up on his glycemic control.  On exam blood pressure 130/96 glucose is checked and is greater than 200 he has just eaten lunch and had taken his insulin  Plan will be to refill his Accu-Chek strips [place] and gabapentin  Plan will also be to increase his insulin dosing to NovoLog 30 units at breakfast 30 units at lunch 20 units at dinner continue Tresiba at 60 units at bedtime  The patient's not been compliant with diet we gave him some Glucerna supplements and instructed him to not be eating candy and other high glycemic contact foods

## 2020-06-19 ENCOUNTER — Ambulatory Visit: Payer: Medicare HMO | Admitting: Critical Care Medicine

## 2020-06-19 ENCOUNTER — Other Ambulatory Visit: Payer: Self-pay | Admitting: Critical Care Medicine

## 2020-06-19 MED ORDER — NOVOLOG FLEXPEN 100 UNIT/ML ~~LOC~~ SOPN: PEN_INJECTOR | SUBCUTANEOUS | 3 refills | Status: DC

## 2020-06-19 MED FILL — NOVOLOG FLEXPEN SYRINGE: 100 | 18 days supply | Qty: 15 | Fill #0

## 2020-06-19 NOTE — Progress Notes (Deleted)
Subjective:    Patient ID: Reginald Rodriguez, male    DOB: August 04, 1950, 69 y.o.   MRN: [id number]  03/28/20 This is a 69 year old male who is seen in the [place] clinic and is a new patient to our area.  He just arrived at the shelter 2 weeks ago.  He moved up from [place] where his primary care provider is Dr. Tammi Klippel and he also has a diabetologist Pecola Leisure at the [facility].  This patient has had longstanding diabetes hypertension previous stroke.  This patient states his current medications include Tresiba 60 units daily, Humalog 20 units 3 times daily as needed for blood sugars greater than 250, amlodipine 5 mg daily, Lipitor 10 mg daily, hydrocal thiazide 25 mg daily, gabapentin 100 mg twice daily, lactulose 15 mL daily, fenofibrate 54 mg daily.  The patient does not have a glucometer at this time.  He is allergic to penicillin.  He states he has had some headaches and some degree of constipation at this time.  On exam blood sugar is greater than 400 on meter saturation 98% pulse 79 blood pressure is 150/100  Chest is clear abdomen benign benign cardiac exam was unremarkable  Blood sugar greater than 400 on CBG  Impression is that of prior stroke, hypertension, diabetes type 2  Plan will be [facility] and I will obtain records from his clinic and send a record release which she signed at this visit we will also attempt a get him into our clinic for further follow-up  04/15/2020 Patient is seen today in the clinic for the first time having just moved from [place].  On arrival A1c is 7.7 blood sugar 138.  Longstanding history of hypertension diabetes prior stroke 3 years ago with residual defect with right upper and lower extremity weakness.  On arrival blood pressure is good at 125/86.  Patient uses a cane to ambulate is requesting a Rollator possible.  Patient needs primary care assessments including  hepatitis C assay.  He did have a colonoscopy 3 years ago was negative.  He smokes a pack every 2 days at the shelter.  He has no other new complaints.  06/19/2020  Essential hypertension Hypertension well-controlled this time continue current therapy  Poor dentition Patient given resources for dental care  Uncontrolled type 2 diabetes mellitus with hyperglycemia ([place]) Uncontrolled type 2 diabetes with hyperglycemia we will increase lunch dose to 24 units of NovoLog continue 20 units in the morning in the evening also continue Tresiba 60 units daily  H/O: stroke with residual effects Still partial weakness on the right side will obtain a Rollator assist with ambulation   Xzayvion was seen today for new patient (initial visit).  Diagnoses and all orders for this visit:  Uncontrolled type 2 diabetes mellitus with hyperglycemia (HCC) -     POCT glucose (manual entry) -     POCT glycosylated hemoglobin (Hb A1C) -     Microalbumin / creatinine urine ratio -     Comprehensive metabolic panel -     CBC with Differential/Platelet  Need for hepatitis C screening test -     HCV Ab w/Rflx to Verification  H/O: stroke with residual effects -     For home use only DME Other see comment  Poor dentition  Essential hypertension  Other orders -     chlorthalidone (HYGROTON) 25 MG tablet; Take 1 tablet (25 mg total) by mouth daily. -  atorvastatin (LIPITOR) 10 MG tablet; Take 1 tablet (10 mg total) by mouth daily. -     gabapentin (NEURONTIN) 100 MG capsule; Take 1 capsule (100 mg total) by mouth 2 (two) times daily. -     amLODipine (NORVASC) 5 MG tablet; Take 1 tablet (5 mg total) by mouth daily. -     fenofibrate 54 MG tablet; Take 1 tablet (54 mg total) by mouth daily. -     NOVOLOG FLEXPEN 100 UNIT/ML FlexPen; Use 20 units at breakfast   24 units at lunch 20 units at dinner   Refills on all medications given hepatitis C assay given flu shot was also given  Patient is already  received his Covid vaccine series     No past medical history on file.   No family history on file.   Social History   Socioeconomic History  . Marital status: Single    Spouse name: Not on file  . Number of children: Not on file  . Years of education: Not on file  . Highest education level: Not on file  Occupational History  . Not on file  Tobacco Use  . Smoking status: Current Every Day Smoker  . Smokeless tobacco: Former Network engineer  . Vaping Use: Never used  Substance and Sexual Activity  . Alcohol use: Not Currently  . Drug use: Not Currently  . Sexual activity: Not on file  Other Topics Concern  . Not on file  Social History Narrative  . Not on file   Social Determinants of Health   Financial Resource Strain:   . Difficulty of Paying Living Expenses: Not on file  Food Insecurity:   . Worried About Charity fundraiser in the Last Year: Not on file  . Ran Out of Food in the Last Year: Not on file  Transportation Needs:   . Lack of Transportation (Medical): Not on file  . Lack of Transportation (Non-Medical): Not on file  Physical Activity:   . Days of Exercise per Week: Not on file  . Minutes of Exercise per Session: Not on file  Stress:   . Feeling of Stress : Not on file  Social Connections:   . Frequency of Communication with Friends and Family: Not on file  . Frequency of Social Gatherings with Friends and Family: Not on file  . Attends Religious Services: Not on file  . Active Member of Clubs or Organizations: Not on file  . Attends Archivist Meetings: Not on file  . Marital Status: Not on file  Intimate Partner Violence:   . Fear of Current or Ex-Partner: Not on file  . Emotionally Abused: Not on file  . Physically Abused: Not on file  . Sexually Abused: Not on file     Allergies  Allergen Reactions  . Bee Venom Swelling  . Penicillins Rash     Outpatient Medications Prior to Visit  Medication Sig Dispense Refill  .  ACCU-CHEK GUIDE test strip USE TO check blood sugar 2 TIMES DAILY 100 each 1  . Accu-Chek Softclix Lancets lancets 2 (two) times daily.    Marland Kitchen amLODipine (NORVASC) 5 MG tablet Take 1 tablet (5 mg total) by mouth daily. 90 tablet 1  . aspirin 81 MG EC tablet     . atorvastatin (LIPITOR) 10 MG tablet Take 1 tablet (10 mg total) by mouth daily. 90 tablet 2  . Blood Glucose Monitoring Suppl (ACCU-CHEK GUIDE) w/Device KIT Use to test blood sugar 1  kit 0  . chlorthalidone (HYGROTON) 25 MG tablet Take 1 tablet (25 mg total) by mouth daily. 90 tablet 1  . fenofibrate 54 MG tablet Take 1 tablet (54 mg total) by mouth daily. 90 tablet 1  . gabapentin (NEURONTIN) 100 MG capsule Take 1 capsule (100 mg total) by mouth 2 (two) times daily. 60 capsule 1  . lactulose (CHRONULAC) 10 GM/15ML solution Take 10 g by mouth daily.    . nicotine (NICODERM CQ - DOSED IN MG/24 HOURS) 21 mg/24hr patch Place 1 patch (21 mg total) onto the skin daily. 28 patch 0  . NOVOLOG FLEXPEN 100 UNIT/ML FlexPen Use 30 units at breakfast   30 units at lunch 20 units at dinner 15 mL 3  . TRESIBA FLEXTOUCH 100 UNIT/ML FlexTouch Pen Inject 60 Units into the skin at bedtime. 12 mL 0  . UNIFINE PENTIPS PLUS 32G X 4 MM MISC USE 4 TIMES DAILY AS DIRECTED 100 each 1   No facility-administered medications prior to visit.      Review of Systems Constitutional:   No  weight loss, night sweats,  Fevers, chills, fatigue, lassitude. HEENT:   No headaches,  Difficulty swallowing,  Tooth/dental problems,  Sore throat,                No sneezing, itching, ear ache, nasal congestion, post nasal drip,   CV:  No chest pain,  Orthopnea, PND, swelling in lower extremities, anasarca, dizziness, palpitations  GI  No heartburn, indigestion, abdominal pain, nausea, vomiting, diarrhea, change in bowel habits, loss of appetite  Resp: No shortness of breath with exertion or at rest.  No excess mucus, no productive cough,  No non-productive cough,  No  coughing up of blood.  No change in color of mucus.  No wheezing.  No chest wall deformity  Skin: no rash or lesions.  GU: no dysuria, change in color of urine, no urgency or frequency.  No flank pain.  MS:  No joint pain or swelling.  No decreased range of motion.  No back pain.  Psych:  No change in mood or affect. No depression or anxiety.  No memory loss.     Objective:   Physical Exam There were no vitals filed for this visit.  Gen: Pleasant, well-nourished, in no distress,  normal affect  ENT: No lesions,  mouth clear,  oropharynx clear, no postnasal drip  Neck: No JVD, no TMG, no carotid bruits  Lungs: No use of accessory muscles, no dullness to percussion, clear without rales or rhonchi  Cardiovascular: RRR, heart sounds normal, no murmur or gallops, no peripheral edema  Abdomen: soft and NT, no HSM,  BS normal  Musculoskeletal: No deformities, no cyanosis or clubbing  Neuro: alert, non focal  Skin: Warm, no lesions or rashes  No results found.        Assessment & Plan:  I personally reviewed all images and lab data in the [facility] system as well as any outside material available during this office visit and agree with the  radiology impressions.   No problem-specific Assessment & Plan notes found for this encounter.   There are no diagnoses linked to this encounter. Refills on all medications given hepatitis C assay given flu shot was also given  Patient is already received his Covid vaccine series

## 2020-06-19 NOTE — Progress Notes (Signed)
Refills novolog

## 2020-06-26 ENCOUNTER — Encounter: Payer: Self-pay | Admitting: Critical Care Medicine

## 2020-06-26 NOTE — Progress Notes (Signed)
Patient ID: Reginald Rodriguez, male   DOB: 12-11-1950, 69 y.o.   MRN: [id number] Patient is here today for blood pressure check with type 2 diabetes hypertension  On exam blood pressure 128/88 heart rate 108 saturation 98% room air  The patient had questions on transportation to his next appointment I informed her he will be picked up by [facility] for his appointment tomorrow which is December 9 and for future appointments he will be given transportation by [facility] transportation we will up finish his application when he comes to the clinic tomorrow

## 2020-06-27 ENCOUNTER — Other Ambulatory Visit: Payer: Self-pay | Admitting: Pharmacist

## 2020-06-27 ENCOUNTER — Other Ambulatory Visit: Payer: Self-pay

## 2020-06-27 ENCOUNTER — Other Ambulatory Visit: Payer: Self-pay | Admitting: Critical Care Medicine

## 2020-06-27 ENCOUNTER — Encounter: Payer: Self-pay | Admitting: Critical Care Medicine

## 2020-06-27 ENCOUNTER — Telehealth: Payer: Self-pay

## 2020-06-27 ENCOUNTER — Ambulatory Visit: Payer: Medicare HMO | Attending: Critical Care Medicine | Admitting: Critical Care Medicine

## 2020-06-27 VITALS — BP 123/83 | HR 90 | Wt 232.0 lb

## 2020-06-27 DIAGNOSIS — K089 Disorder of teeth and supporting structures, unspecified: Secondary | ICD-10-CM

## 2020-06-27 DIAGNOSIS — N1832 Chronic kidney disease, stage 3b: Secondary | ICD-10-CM

## 2020-06-27 DIAGNOSIS — I6930 Unspecified sequelae of cerebral infarction: Secondary | ICD-10-CM

## 2020-06-27 DIAGNOSIS — E1169 Type 2 diabetes mellitus with other specified complication: Secondary | ICD-10-CM

## 2020-06-27 DIAGNOSIS — I10 Essential (primary) hypertension: Secondary | ICD-10-CM | POA: Diagnosis not present

## 2020-06-27 DIAGNOSIS — Z720 Tobacco use: Secondary | ICD-10-CM | POA: Insufficient documentation

## 2020-06-27 DIAGNOSIS — E1165 Type 2 diabetes mellitus with hyperglycemia: Secondary | ICD-10-CM

## 2020-06-27 DIAGNOSIS — F17200 Nicotine dependence, unspecified, uncomplicated: Secondary | ICD-10-CM | POA: Diagnosis not present

## 2020-06-27 DIAGNOSIS — E785 Hyperlipidemia, unspecified: Secondary | ICD-10-CM

## 2020-06-27 DIAGNOSIS — B351 Tinea unguium: Secondary | ICD-10-CM

## 2020-06-27 LAB — POCT GLYCOSYLATED HEMOGLOBIN (HGB A1C): Hemoglobin A1C: 9.7 % — AB (ref 4.0–5.6)

## 2020-06-27 LAB — GLUCOSE, POCT (MANUAL RESULT ENTRY): POC Glucose: 155 mg/dl — AB (ref 70–99)

## 2020-06-27 MED ORDER — TRULICITY 0.75 MG/0.5ML ~~LOC~~ SOAJ: 0.7500 mg | SUBCUTANEOUS | 4 refills | Status: DC

## 2020-06-27 MED ORDER — CHLORTHALIDONE 25 MG PO TABS
25.0000 mg | ORAL_TABLET | Freq: Every day | ORAL | 1 refills | Status: DC
Start: 2020-06-27 — End: 2020-07-11

## 2020-06-27 MED ORDER — FENOFIBRATE 54 MG PO TABS
54.0000 mg | ORAL_TABLET | Freq: Every day | ORAL | 1 refills | Status: DC
Start: 2020-06-27 — End: 2020-07-11

## 2020-06-27 MED ORDER — UNIFINE PENTIPS PLUS 32G X 4 MM MISC: 1 refills | Status: DC

## 2020-06-27 MED ORDER — ATORVASTATIN CALCIUM 10 MG PO TABS
10.0000 mg | ORAL_TABLET | Freq: Every day | ORAL | 2 refills | Status: DC
Start: 2020-06-27 — End: 2020-07-11

## 2020-06-27 MED ORDER — AMLODIPINE BESYLATE 5 MG PO TABS: 5.0000 mg | ORAL_TABLET | Freq: Every day | ORAL | 1 refills | Status: DC

## 2020-06-27 MED FILL — UNIFINE PENTIPS 32GX5/32: 32G X 4 MM | 25 days supply | Qty: 100 | Fill #0

## 2020-06-27 MED FILL — TRULICITY 0.75 MG/0.5 ML PE: 0.75 | 28 days supply | Qty: 2 | Fill #0

## 2020-06-27 NOTE — Patient Instructions (Signed)
Begin Trulicity 1 weekly  No change in your other medications  Labs today to check on your diabetes status  Refills on your medications will be sent  Foot doctor referral is made  Return to see Dr. Joya Gaskins here in the clinic 3 months

## 2020-06-27 NOTE — Telephone Encounter (Signed)
Met with the patient when he was in the clinic today and assisted him with completing the SCAT application.   His transportation to and from the clinic was arranged by E[place]rea Hussey,RN/Congregational Nurse.  He explained that he is currently staying at [facility] and has been there since 03/2020.  He said that [facility] contacted him and he hopes to move into his own apartment by the end of this month.    SCAt application faxed to [employer] [place] eligibility

## 2020-06-27 NOTE — Assessment & Plan Note (Addendum)
Add Trulicity and continue [place] and NovoLog at current dose levels  Patient to obtain an eye exam

## 2020-06-27 NOTE — Assessment & Plan Note (Addendum)
Referral to podiatry made

## 2020-06-27 NOTE — Progress Notes (Signed)
Lost eye glasses

## 2020-06-27 NOTE — Assessment & Plan Note (Signed)
    Current smoking consumption amount: 1 pack every 2 days . Dicsussion on advise to quit smoking and smoking impacts: Cardiovascular effects  . Patient's willingness to quit: Not yet willing to quit  . Methods to quit smoking discussed: Behavioral modification  . Medication management of smoking session drugs discussed: Nicotine replacement  . Resources provided:  AVS   . Setting quit date not established  . Follow-up arranged 2 months   Time spent counseling the patient: 5 minutes

## 2020-06-27 NOTE — Assessment & Plan Note (Signed)
Continue lipid therapy and aspirin

## 2020-06-27 NOTE — Assessment & Plan Note (Signed)
Hypertension stable at this time continue current medications refills given

## 2020-06-27 NOTE — Assessment & Plan Note (Signed)
Patient encouraged to follow-up for dental care

## 2020-06-27 NOTE — Assessment & Plan Note (Signed)
Follow-up lab data

## 2020-06-27 NOTE — Progress Notes (Signed)
Subjective:    Patient ID: Reginald Rodriguez, male    DOB: 11/27/1950, 69 y.o.   MRN: [id number]  03/28/20 This is a 69 year old male who is seen in the [place] clinic and is a new patient to our area.  He just arrived at the shelter 2 weeks ago.  He moved up from [place] where his primary care provider is Dr. Tammi Klippel and he also has a diabetologist Pecola Leisure at the [facility].  This patient has had longstanding diabetes hypertension previous stroke.  This patient states his current medications include Tresiba 60 units daily, Humalog 20 units 3 times daily as needed for blood sugars greater than 250, amlodipine 5 mg daily, Lipitor 10 mg daily, hydrocal thiazide 25 mg daily, gabapentin 100 mg twice daily, lactulose 15 mL daily, fenofibrate 54 mg daily.  The patient does not have a glucometer at this time.  He is allergic to penicillin.  He states he has had some headaches and some degree of constipation at this time.  On exam blood sugar is greater than 400 on meter saturation 98% pulse 79 blood pressure is 150/100  Chest is clear abdomen benign benign cardiac exam was unremarkable  Blood sugar greater than 400 on CBG  Impression is that of prior stroke, hypertension, diabetes type 2  Plan [facility] and I will obtain records from his clinic and send a record release which she signed at this visit we will also attempt a get him into our clinic for further follow-up  04/15/2020 Patient is seen today in the clinic for the first time having just moved from [place].  On arrival A1c is 7.7 blood sugar 138.  Longstanding history of hypertension diabetes prior stroke 3 years ago with residual defect with right upper and lower extremity weakness.  On arrival blood pressure is good at 125/86.  Patient uses a cane to ambulate is requesting a Rollator possible.  Patient needs primary care assessments including  hepatitis C assay.  He did have a colonoscopy 3 years ago was negative.  He smokes a pack every 2 days at the shelter.  He has no other new complaints.  06/27/2020 This patient is seen in return follow-up is homeless has type 2 diabetes note his A1c is up to 9.7 he has been missing doses of insulin  Patient also lost his eyeglasses and will need a new set of glasses  He has no other specific complaints at this visit he is still smoking 1 pack every 2 days at the homeless shelter  He is due a Covid booster he states he will pursue  History reviewed. No pertinent past medical history.   History reviewed. No pertinent family history.   Social History   Socioeconomic History  . Marital status: Single    Spouse name: Not on file  . Number of children: Not on file  . Years of education: Not on file  . Highest education level: Not on file  Occupational History  . Not on file  Tobacco Use  . Smoking status: Current Every Day Smoker  . Smokeless tobacco: Former Network engineer  . Vaping Use: Never used  Substance and Sexual Activity  . Alcohol use: Not Currently  . Drug use: Not Currently  . Sexual activity: Not on file  Other Topics Concern  . Not on file  Social History Narrative  . Not on file   Social  Determinants of Health   Financial Resource Strain: Not on file  Food Insecurity: Not on file  Transportation Needs: Not on file  Physical Activity: Not on file  Stress: Not on file  Social Connections: Not on file  Intimate Partner Violence: Not on file     Allergies  Allergen Reactions  . Bee Venom Swelling  . Penicillins Rash     Outpatient Medications Prior to Visit  Medication Sig Dispense Refill  . ACCU-CHEK GUIDE test strip USE TO check blood sugar 2 TIMES DAILY 100 each 1  . Accu-Chek Softclix Lancets lancets 2 (two) times daily.    Marland Kitchen aspirin 81 MG EC tablet     . Blood Glucose Monitoring Suppl (ACCU-CHEK GUIDE) w/Device KIT Use to test blood sugar 1 kit 0   . gabapentin (NEURONTIN) 100 MG capsule Take 1 capsule (100 mg total) by mouth 2 (two) times daily. 60 capsule 1  . lactulose (CHRONULAC) 10 GM/15ML solution Take 10 g by mouth daily.    . nicotine (NICODERM CQ - DOSED IN MG/24 HOURS) 21 mg/24hr patch Place 1 patch (21 mg total) onto the skin daily. 28 patch 0  . NOVOLOG FLEXPEN 100 UNIT/ML FlexPen Use 30 units at breakfast   30 units at lunch 20 units at dinner 15 mL 3  . TRESIBA FLEXTOUCH 100 UNIT/ML FlexTouch Pen Inject 60 Units into the skin at bedtime. 12 mL 0  . amLODipine (NORVASC) 5 MG tablet Take 1 tablet (5 mg total) by mouth daily. 90 tablet 1  . atorvastatin (LIPITOR) 10 MG tablet Take 1 tablet (10 mg total) by mouth daily. 90 tablet 2  . chlorthalidone (HYGROTON) 25 MG tablet Take 1 tablet (25 mg total) by mouth daily. 90 tablet 1  . fenofibrate 54 MG tablet Take 1 tablet (54 mg total) by mouth daily. 90 tablet 1  . UNIFINE PENTIPS PLUS 32G X 4 MM MISC USE 4 TIMES DAILY AS DIRECTED 100 each 1   No facility-administered medications prior to visit.      Review of Systems  Constitutional: Negative.   HENT: Positive for dental problem.   Eyes: Positive for visual disturbance.  Cardiovascular: Negative for chest pain.  Gastrointestinal: Negative.   Endocrine: Negative.   Genitourinary: Negative.   Musculoskeletal: Negative.   Skin: Negative.   Neurological: Positive for facial asymmetry, speech difficulty and weakness.       Right leg weak s/p CVA  Psychiatric/Behavioral: The patient is nervous/anxious.    Constitutional:   No  weight loss, night sweats,  Fevers, chills, fatigue, lassitude. HEENT:   No headaches,  Difficulty swallowing,  Tooth/dental problems,  Sore throat,                No sneezing, itching, ear ache, nasal congestion, post nasal drip,   CV:  No chest pain,  Orthopnea, PND, swelling in lower extremities, anasarca, dizziness, palpitations  GI  No heartburn, indigestion, abdominal pain, nausea, vomiting,  diarrhea, change in bowel habits, loss of appetite  Resp: No shortness of breath with exertion or at rest.  No excess mucus, no productive cough,  No non-productive cough,  No coughing up of blood.  No change in color of mucus.  No wheezing.  No chest wall deformity  Skin: no rash or lesions.  GU: no dysuria, change in color of urine, no urgency or frequency.  No flank pain.  MS:  No joint pain or swelling.  No decreased range of motion.  No back pain.  Psych:  No change in mood or affect. No depression or anxiety.  No memory loss.     Objective:   Physical Exam  Vitals:   06/27/20 1108  BP: 123/83  Pulse: 90  SpO2: 99%  Weight: 232 lb (105.2 kg)    Gen: Pleasant, obese, in no distress,  normal affect  ENT: No lesions,  mouth clear,  oropharynx clear, no postnasal drip  Neck: No JVD, no TMG, no carotid bruits  Lungs: No use of accessory muscles, no dullness to percussion, clear without rales or rhonchi  Cardiovascular: RRR, heart sounds normal, no murmur or gallops, no peripheral edema  Abdomen: soft and NT, no HSM,  BS normal  Musculoskeletal: No deformities, no cyanosis or clubbing  Neuro: alert, weak in right upper extremity  Skin: Warm, no lesions or rashes Foot exam shows normal sensation severe onychomycosis bilaterally [place] toenails dry skin no other lesions seen     Assessment & Plan:  I personally reviewed all images and lab data in the [facility] system as well as any outside material available during this office visit and agree with the  radiology impressions.   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 Dicsussion on advise to quit smoking and smoking impacts: Cardiovascular effects  . Patient's willingness to quit: Not yet willing to quit  . Methods to quit smoking discussed: Behavioral modification  . Medication management of smoking session drugs discussed: Nicotine replacement  . Resources provided:  AVS   . Setting quit date not established  . Follow-up arranged 2 months   Time spent counseling the patient: 5 minutes    Deward was seen today for follow-up.  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 -     amLODipine (NORVASC) 5 MG tablet; Take 1 tablet (5 mg total) by mouth daily. -     fenofibrate 54 MG tablet; Take 1 tablet (54 mg total) by mouth daily. -     chlorthalidone (HYGROTON) 25 MG tablet; Take 1 tablet (25 mg total) by mouth daily. -  UNIFINE PENTIPS PLUS 32G X 4 MM MISC; USE 4 TIMES DAILY AS DIRECTED -     atorvastatin (LIPITOR)  10 MG tablet; Take 1 tablet (10 mg total) by mouth daily.

## 2020-06-28 LAB — LIPID PANEL
Chol/HDL Ratio: 4.3 ratio (ref 0.0–5.0)
Cholesterol, Total: 139 mg/dL (ref 100–199)
HDL: 32 mg/dL — ABNORMAL LOW (ref >39)
LDL Chol Calc (NIH): 74 mg/dL (ref 0–99)
Triglycerides: 193 mg/dL — ABNORMAL HIGH (ref 0–149)
VLDL Cholesterol Cal: 33 mg/dL (ref 5–40)

## 2020-06-28 LAB — CBC WITH DIFFERENTIAL/PLATELET
Basophils Absolute: 0.1 10*3/uL (ref 0.0–0.2)
Basos: 1 %
EOS (ABSOLUTE): 0.5 10*3/uL — ABNORMAL HIGH (ref 0.0–0.4)
Eos: 5 %
Hematocrit: 45.1 % (ref 37.5–51.0)
Hemoglobin: 14.9 g/dL (ref 13.0–17.7)
Immature Grans (Abs): 0 10*3/uL (ref 0.0–0.1)
Immature Granulocytes: 0 %
Lymphocytes Absolute: 4.6 10*3/uL — ABNORMAL HIGH (ref 0.7–3.1)
Lymphs: 48 %
MCH: 29.2 pg (ref 26.6–33.0)
MCHC: 33 g/dL (ref 31.5–35.7)
MCV: 88 fL (ref 79–97)
Monocytes Absolute: 0.9 10*3/uL (ref 0.1–0.9)
Monocytes: 10 %
Neutrophils Absolute: 3.3 10*3/uL (ref 1.4–7.0)
Neutrophils: 36 %
Platelets: 323 10*3/uL (ref 150–450)
RBC: 5.11 x10E6/uL (ref 4.14–5.80)
RDW: 13.4 % (ref 11.6–15.4)
WBC: 9.3 10*3/uL (ref 3.4–10.8)

## 2020-06-28 LAB — COMPREHENSIVE METABOLIC PANEL
ALT: 25 IU/L (ref 0–44)
AST: 21 IU/L (ref 0–40)
Albumin/Globulin Ratio: 1.5 (ref 1.2–2.2)
Albumin: 4.5 g/dL (ref 3.8–4.8)
Alkaline Phosphatase: 70 IU/L (ref 44–121)
BUN/Creatinine Ratio: 14 (ref 10–24)
BUN: 18 mg/dL (ref 8–27)
Bilirubin Total: 0.2 mg/dL (ref 0.0–1.2)
CO2: 27 mmol/L (ref 20–29)
Calcium: 10.1 mg/dL (ref 8.6–10.2)
Chloride: 97 mmol/L (ref 96–106)
Creatinine, Ser: 1.31 mg/dL — ABNORMAL HIGH (ref 0.76–1.27)
GFR calc Af Amer: 65 mL/min/{1.73_m2} (ref >59)
GFR calc non Af Amer: 56 mL/min/{1.73_m2} — ABNORMAL LOW (ref >59)
Globulin, Total: 3 g/dL (ref 1.5–4.5)
Glucose: 107 mg/dL — ABNORMAL HIGH (ref 65–99)
Potassium: 3.8 mmol/L (ref 3.5–5.2)
Sodium: 140 mmol/L (ref 134–144)
Total Protein: 7.5 g/dL (ref 6.0–8.5)

## 2020-06-28 LAB — MICROALBUMIN / CREATININE URINE RATIO
Creatinine, Urine: 50.2 mg/dL
Microalb/Creat Ratio: 13 mg/g creat (ref 0–29)
Microalbumin, Urine: 6.5 ug/mL

## 2020-07-02 ENCOUNTER — Telehealth: Payer: Self-pay | Admitting: Pediatric Intensive Care

## 2020-07-02 NOTE — Telephone Encounter (Signed)
Left HIPAA compliant message to return call regarding transportation. Lisette Abu RN BSN CNP [phone]

## 2020-07-03 ENCOUNTER — Encounter: Payer: Self-pay | Admitting: Critical Care Medicine

## 2020-07-03 NOTE — Progress Notes (Signed)
THe patient is seen at [facility] shelter clinic.  CBG 177   BP 138/90  P 110   Pt is ready to move out soon

## 2020-07-04 ENCOUNTER — Ambulatory Visit: Payer: Medicare HMO | Admitting: Podiatry

## 2020-07-10 ENCOUNTER — Telehealth: Payer: Self-pay | Admitting: Pediatric Intensive Care

## 2020-07-10 NOTE — Telephone Encounter (Signed)
Call from client via [facility]- client thought this CN had called him. He states he had gotten a call from [phone]. CN states that is the number for [facility]. Client states that he is low on insulin and "some other pill" he tried to pick up at his appointment on 12/15. CN advised client to call [facility] back. Lisette Abu RN BSN CNP [phone]

## 2020-07-10 NOTE — Telephone Encounter (Signed)
Call this patient and find out what med he is out of now

## 2020-07-11 ENCOUNTER — Telehealth: Payer: Self-pay | Admitting: Critical Care Medicine

## 2020-07-11 ENCOUNTER — Other Ambulatory Visit: Payer: Self-pay | Admitting: Critical Care Medicine

## 2020-07-11 MED ORDER — FENOFIBRATE 54 MG PO TABS
54.0000 mg | ORAL_TABLET | Freq: Every day | ORAL | 1 refills | Status: DC
Start: 2020-07-11 — End: 2020-08-27

## 2020-07-11 MED ORDER — AMLODIPINE BESYLATE 5 MG PO TABS: 5.0000 mg | ORAL_TABLET | Freq: Every day | ORAL | 1 refills | Status: DC

## 2020-07-11 MED ORDER — ATORVASTATIN CALCIUM 10 MG PO TABS
10.0000 mg | ORAL_TABLET | Freq: Every day | ORAL | 2 refills | Status: DC
Start: 2020-07-11 — End: 2020-08-27

## 2020-07-11 MED ORDER — TRESIBA FLEXTOUCH 100 UNIT/ML ~~LOC~~ SOPN
60.0000 [IU] | PEN_INJECTOR | Freq: Every day | SUBCUTANEOUS | 0 refills | Status: DC
Start: 2020-07-11 — End: 2020-08-06

## 2020-07-11 MED ORDER — CHLORTHALIDONE 25 MG PO TABS
25.0000 mg | ORAL_TABLET | Freq: Every day | ORAL | 1 refills | Status: DC
Start: 2020-07-11 — End: 2020-08-27

## 2020-07-11 MED FILL — FENOFIBRATE 54 MG TABLET: 54 | 90 days supply | Qty: 90 | Fill #0

## 2020-07-11 MED FILL — ATORVASTATIN 10 MG TABLET: 10 | 90 days supply | Qty: 90 | Fill #0

## 2020-07-11 MED FILL — CHLORTHALIDONE 25 MG TAB: 25 | 90 days supply | Qty: 90 | Fill #0

## 2020-07-11 MED FILL — TRESIBA FLEXTOUCH 100 UNITS: 100 | 20 days supply | Qty: 12 | Fill #0

## 2020-07-11 MED FILL — AMLODIPINE BESYLATE 5 MG TA: 5 | 90 days supply | Qty: 90 | Fill #0

## 2020-07-11 NOTE — Telephone Encounter (Signed)
Pt needs refills on medications. 

## 2020-07-11 NOTE — Telephone Encounter (Signed)
See other telephone note,  Refills sent in

## 2020-07-15 ENCOUNTER — Telehealth: Payer: Self-pay | Admitting: Pediatric Intensive Care

## 2020-07-15 NOTE — Telephone Encounter (Signed)
Call to client. Left HIPAA complaint message for call back. Shann Medal RN BSN CNP [phone]

## 2020-07-16 ENCOUNTER — Telehealth: Payer: Self-pay | Admitting: Pediatric Intensive Care

## 2020-07-16 NOTE — Telephone Encounter (Signed)
Call from client- CN advised client that ride will pick him up for appointment tomorrow between [phone]. CN will call client when she gets notification of car type. Client will call CN when he is finished with appointment. Shann Medal RN BSN CNP [phone]

## 2020-07-16 NOTE — Telephone Encounter (Signed)
Left message for apartment site manager to call back. Shann Medal Rn BSN CNP [phone]

## 2020-07-16 NOTE — Telephone Encounter (Signed)
Left HIPAA compliant message regarding call back to this CN regarding transportation to eye appointment. Shann Medal RN BSN CNP [phone]

## 2020-07-18 MED FILL — GABAPENTIN 100 MG CAPSULE: 100 | 30 days supply | Qty: 60 | Fill #1

## 2020-07-22 MED FILL — TRULICITY 0.75 MG/0.5 ML PE: 0.75 | 28 days supply | Qty: 2 | Fill #1

## 2020-07-22 MED FILL — NOVOLOG FLEXPEN SYRINGE: 100 | 18 days supply | Qty: 15 | Fill #1

## 2020-07-24 ENCOUNTER — Telehealth: Payer: Self-pay | Admitting: Critical Care Medicine

## 2020-07-24 NOTE — Telephone Encounter (Signed)
Called patient and LVM to schedule an appointment with Dr. Delford Field in the next 4-6 weeks per Delford Field. Advised patient to call [phone] to schedule.

## 2020-07-30 ENCOUNTER — Other Ambulatory Visit: Payer: Self-pay | Admitting: Critical Care Medicine

## 2020-07-30 ENCOUNTER — Telehealth: Payer: Self-pay | Admitting: Critical Care Medicine

## 2020-07-30 ENCOUNTER — Other Ambulatory Visit: Payer: Self-pay | Admitting: Pharmacist

## 2020-07-30 DIAGNOSIS — K089 Disorder of teeth and supporting structures, unspecified: Secondary | ICD-10-CM

## 2020-07-30 DIAGNOSIS — E1165 Type 2 diabetes mellitus with hyperglycemia: Secondary | ICD-10-CM

## 2020-07-30 MED ORDER — ACCU-CHEK SOFTCLIX LANCETS MISC: 2 refills | Status: DC

## 2020-07-30 MED FILL — ACCU-CHEK SOFTCLIX LANCETS: 50 days supply | Qty: 100 | Fill #0

## 2020-07-30 NOTE — Telephone Encounter (Signed)
Dental referral sent

## 2020-07-30 NOTE — Telephone Encounter (Signed)
Called pt made aware of MD message.

## 2020-07-30 NOTE — Telephone Encounter (Signed)
Patient is needing a dental referral. He stated he has [employer] and needs a referral to a dentist that is covered under [employer]. Patient would like a call back from Dr. Joya Gaskins. Please advise.

## 2020-08-06 ENCOUNTER — Other Ambulatory Visit: Payer: Self-pay | Admitting: Critical Care Medicine

## 2020-08-06 MED FILL — TRESIBA FLEXTOUCH 100 UNITS: 100 | 20 days supply | Qty: 12 | Fill #0

## 2020-08-13 MED FILL — ACCU-CHEK GUIDE TEST STRIP: 50 days supply | Qty: 100 | Fill #0

## 2020-08-13 MED FILL — TRULICITY 0.75 MG/0.5 ML PE: 0.75 | 28 days supply | Qty: 2 | Fill #2

## 2020-08-13 MED FILL — NOVOLOG FLEXPEN SYRINGE: 100 | 18 days supply | Qty: 15 | Fill #2

## 2020-08-19 ENCOUNTER — Other Ambulatory Visit: Payer: Self-pay | Admitting: Critical Care Medicine

## 2020-08-19 MED ORDER — UNIFINE PENTIPS PLUS 32G X 4 MM MISC: 1 refills | Status: DC

## 2020-08-19 MED ORDER — GABAPENTIN 100 MG PO CAPS: 100.0000 mg | ORAL_CAPSULE | Freq: Two times a day (BID) | ORAL | 1 refills | Status: DC

## 2020-08-19 MED FILL — GABAPENTIN 100 MG CAPSULE: 100 | 30 days supply | Qty: 60 | Fill #0

## 2020-08-19 MED FILL — TRUEPLUS PEN NDL 32GX5/32: 32G X 4 MM | 25 days supply | Qty: 100 | Fill #0

## 2020-08-19 NOTE — Progress Notes (Signed)
Med refills

## 2020-08-27 ENCOUNTER — Other Ambulatory Visit: Payer: Self-pay

## 2020-08-27 ENCOUNTER — Other Ambulatory Visit: Payer: Self-pay | Admitting: Critical Care Medicine

## 2020-08-27 ENCOUNTER — Ambulatory Visit: Payer: Medicare HMO | Attending: Critical Care Medicine | Admitting: Critical Care Medicine

## 2020-08-27 ENCOUNTER — Encounter: Payer: Self-pay | Admitting: Critical Care Medicine

## 2020-08-27 VITALS — BP 122/90 | HR 84 | Resp 16 | Wt 237.0 lb

## 2020-08-27 DIAGNOSIS — I714 Abdominal aortic aneurysm, without rupture, unspecified: Secondary | ICD-10-CM

## 2020-08-27 DIAGNOSIS — R6889 Other general symptoms and signs: Secondary | ICD-10-CM | POA: Diagnosis not present

## 2020-08-27 DIAGNOSIS — J432 Centrilobular emphysema: Secondary | ICD-10-CM

## 2020-08-27 DIAGNOSIS — Z720 Tobacco use: Secondary | ICD-10-CM | POA: Diagnosis not present

## 2020-08-27 DIAGNOSIS — E1165 Type 2 diabetes mellitus with hyperglycemia: Secondary | ICD-10-CM

## 2020-08-27 DIAGNOSIS — M79661 Pain in right lower leg: Secondary | ICD-10-CM | POA: Insufficient documentation

## 2020-08-27 DIAGNOSIS — I10 Essential (primary) hypertension: Secondary | ICD-10-CM | POA: Diagnosis not present

## 2020-08-27 LAB — GLUCOSE, POCT (MANUAL RESULT ENTRY): POC Glucose: 78 mg/dl (ref 70–99)

## 2020-08-27 MED ORDER — NOVOLOG FLEXPEN 100 UNIT/ML ~~LOC~~ SOPN
PEN_INJECTOR | SUBCUTANEOUS | 3 refills | Status: DC
Start: 2020-08-27 — End: 2020-10-15

## 2020-08-27 MED ORDER — TRESIBA FLEXTOUCH 100 UNIT/ML ~~LOC~~ SOPN
60.0000 [IU] | PEN_INJECTOR | Freq: Every day | SUBCUTANEOUS | 1 refills | Status: DC
Start: 2020-08-27 — End: 2020-10-15

## 2020-08-27 MED ORDER — AMLODIPINE BESYLATE 5 MG PO TABS: 5.0000 mg | ORAL_TABLET | Freq: Every day | ORAL | 1 refills | Status: DC

## 2020-08-27 MED ORDER — ATORVASTATIN CALCIUM 10 MG PO TABS: 10.0000 mg | ORAL_TABLET | Freq: Every day | ORAL | 2 refills | Status: DC

## 2020-08-27 MED ORDER — FENOFIBRATE 54 MG PO TABS: 54.0000 mg | ORAL_TABLET | Freq: Every day | ORAL | 1 refills | Status: DC

## 2020-08-27 MED ORDER — TRESIBA FLEXTOUCH 100 UNIT/ML ~~LOC~~ SOPN
60.0000 [IU] | PEN_INJECTOR | Freq: Every day | SUBCUTANEOUS | 0 refills | Status: DC
Start: 2020-08-27 — End: 2020-08-27

## 2020-08-27 MED ORDER — GABAPENTIN 100 MG PO CAPS: 100.0000 mg | ORAL_CAPSULE | Freq: Two times a day (BID) | ORAL | 1 refills | Status: DC

## 2020-08-27 MED ORDER — CHLORTHALIDONE 25 MG PO TABS: 25.0000 mg | ORAL_TABLET | Freq: Every day | ORAL | 1 refills | Status: DC

## 2020-08-27 MED ORDER — TRESIBA FLEXTOUCH 100 UNIT/ML ~~LOC~~ SOPN: 60.0000 [IU] | PEN_INJECTOR | Freq: Every day | SUBCUTANEOUS | 0 refills | Status: DC

## 2020-08-27 MED ORDER — ASPIRIN 81 MG PO TBEC: 81.0000 mg | DELAYED_RELEASE_TABLET | Freq: Every day | ORAL | 1 refills | Status: DC

## 2020-08-27 MED ORDER — TRULICITY 0.75 MG/0.5ML ~~LOC~~ SOAJ
0.7500 mg | SUBCUTANEOUS | 1 refills | Status: DC
Start: 2020-08-27 — End: 2020-11-25

## 2020-08-27 MED FILL — TRESIBA FLEXTOUCH 100 UNITS: 100 | 45 days supply | Qty: 27 | Fill #0

## 2020-08-27 NOTE — Assessment & Plan Note (Signed)
Hypertension good control no change in meds

## 2020-08-27 NOTE — Assessment & Plan Note (Signed)
History abdominal aneurysm we will watch for now

## 2020-08-27 NOTE — Assessment & Plan Note (Signed)
Improve glycemic control no change in diabetic meds

## 2020-08-27 NOTE — Patient Instructions (Signed)
Labs today as a D-dimer to check for blood clots in the right leg if positive will need to get an ultrasound of the leg  No change in your medications refills on Tresiba sent to our pharmacy today  We will have someone come out to your house to give you your Covid vaccine booster  Continue your healthy diet and your medications and will get refills sent to [facility] mail order  No other medication changes  Return to see Dr. Joya Gaskins 3 months

## 2020-08-27 NOTE — Assessment & Plan Note (Signed)
Right calf pain  Check D-dimer rule out DVT

## 2020-08-27 NOTE — Assessment & Plan Note (Signed)
Tobacco use has been reduced

## 2020-08-27 NOTE — Progress Notes (Signed)
Subjective:    Patient ID: Reginald Rodriguez, male    DOB: 1951-02-24, 70 y.o.   MRN: [id number]  03/28/20 This is a 70 year old male who is seen in the [place] clinic and is a new patient to our area.  He just arrived at the shelter 2 weeks ago.  He moved up from [place] where his primary care provider is Dr. Tammi Klippel and he also has a diabetologist Pecola Leisure at the [facility].  This patient has had longstanding diabetes hypertension previous stroke.  This patient states his current medications include Tresiba 60 units daily, Humalog 20 units 3 times daily as needed for blood sugars greater than 250, amlodipine 5 mg daily, Lipitor 10 mg daily, hydrocal thiazide 25 mg daily, gabapentin 100 mg twice daily, lactulose 15 mL daily, fenofibrate 54 mg daily.  The patient does not have a glucometer at this time.  He is allergic to penicillin.  He states he has had some headaches and some degree of constipation at this time.  On exam blood sugar is greater than 400 on meter saturation 98% pulse 79 blood pressure is 150/100  Chest is clear abdomen benign benign cardiac exam was unremarkable  Blood sugar greater than 400 on CBG  Impression is that of prior stroke, hypertension, diabetes type 2  Plan will [facility] and I will obtain records from his clinic and send a record release which she signed at this visit we will also attempt a get him into our clinic for further follow-up  04/15/2020 Patient is seen today in the clinic for the first time having just moved from [place].  On arrival A1c is 7.7 blood sugar 138.  Longstanding history of hypertension diabetes prior stroke 3 years ago with residual defect with right upper and lower extremity weakness.  On arrival blood pressure is good at 125/86.  Patient uses a cane to ambulate is requesting a Rollator possible.  Patient needs primary care assessments including  hepatitis C assay.  He did have a colonoscopy 3 years ago was negative.  He smokes a pack every 2 days at the shelter.  He has no other new complaints.  06/27/2020 This patient is seen in return follow-up is homeless has type 2 diabetes note his A1c is up to 9.7 he has been missing doses of insulin  Patient also lost his eyeglasses and will need a new set of glasses  He has no other specific complaints at this visit he is still smoking 1 pack every 2 days at the homeless shelter  He is due a Covid booster he states he will pursue  08/27/2020 Pt seen in f/u and doing well so far with diabetes.   The patient has lost some weight his glycemic control is better he brings his meter in blood sugar fasting is 105 postprandial in the 140s.  He now has glasses and able to see better with the glasses.  He complains of some right lower extremity weakness and left back pain.  No other complaints are voiced at this time Patient complains of right lower calf pain  No past medical history on file.   No family history on file.   Social History   Socioeconomic History  . Marital status: Single    Spouse name: Not on file  . Number of children: Not on file  . Years of education: Not on file  . Highest education level: Not on  file  Occupational History  . Not on file  Tobacco Use  . Smoking status: Current Every Day Smoker  . Smokeless tobacco: Former Network engineer  . Vaping Use: Never used  Substance and Sexual Activity  . Alcohol use: Not Currently  . Drug use: Not Currently  . Sexual activity: Not on file  Other Topics Concern  . Not on file  Social History Narrative  . Not on file   Social Determinants of Health   Financial Resource Strain: Not on file  Food Insecurity: Not on file  Transportation Needs: Not on file  Physical Activity: Not on file  Stress: Not on file  Social Connections: Not on file  Intimate Partner Violence: Not on file     Allergies  Allergen Reactions   . Bee Venom Swelling  . Penicillins Rash     Outpatient Medications Prior to Visit  Medication Sig Dispense Refill  . amLODipine (NORVASC) 5 MG tablet Take 1 tablet (5 mg total) by mouth daily. 90 tablet 1  . aspirin 81 MG EC tablet     . Dulaglutide (TRULICITY) 5.09 TO/6.7TI SOPN Inject 0.75 mg into the skin once a week. 2 mL 4  . fenofibrate 54 MG tablet Take 1 tablet (54 mg total) by mouth daily. 90 tablet 1  . gabapentin (NEURONTIN) 100 MG capsule Take 1 capsule (100 mg total) by mouth 2 (two) times daily. 180 capsule 1  . NOVOLOG FLEXPEN 100 UNIT/ML FlexPen Use 30 units at breakfast   30 units at lunch 20 units at dinner 15 mL 3  . TRESIBA FLEXTOUCH 100 UNIT/ML FlexTouch Pen INJECT 60 UNITS INTO THE SKIN AT BEDTIME. 12 mL 0  . ACCU-CHEK GUIDE test strip USE TO check blood sugar 2 TIMES DAILY 100 each 1  . Accu-Chek Softclix Lancets lancets Use to check blood sugar BID. 100 each 2  . atorvastatin (LIPITOR) 10 MG tablet Take 1 tablet (10 mg total) by mouth daily. 90 tablet 2  . Blood Glucose Monitoring Suppl (ACCU-CHEK GUIDE) w/Device KIT Use to test blood sugar 1 kit 0  . chlorthalidone (HYGROTON) 25 MG tablet Take 1 tablet (25 mg total) by mouth daily. 90 tablet 1  . lactulose (CHRONULAC) 10 GM/15ML solution Take 10 g by mouth daily. (Patient not taking: Reported on 08/27/2020)    . nicotine (NICODERM CQ - DOSED IN MG/24 HOURS) 21 mg/24hr patch Place 1 patch (21 mg total) onto the skin daily. 28 patch 0  . UNIFINE PENTIPS PLUS 32G X 4 MM MISC USE 4 TIMES DAILY AS DIRECTED 100 each 1   No facility-administered medications prior to visit.      Review of Systems  Constitutional: Negative.   HENT: Positive for dental problem.   Eyes: Negative for visual disturbance.  Cardiovascular: Negative for chest pain.  Gastrointestinal: Negative.   Endocrine: Negative.   Genitourinary: Negative.   Musculoskeletal:       Right calf pain  Skin: Negative.   Neurological: Positive for facial  asymmetry, speech difficulty and weakness.       Right leg weak s/p CVA  Psychiatric/Behavioral: The patient is not nervous/anxious.       Objective:   Physical Exam Vitals:   08/27/20 0824 08/27/20 0913  BP: (!) 141/98 122/90  Pulse: 84   Resp: 16   SpO2: 98%   Weight: 237 lb (107.5 kg)     Gen: Pleasant, obese, in no distress,  normal affect  ENT: No lesions,  mouth  clear,  oropharynx clear, no postnasal drip  Neck: No JVD, no TMG, no carotid bruits  Lungs: No use of accessory muscles, no dullness to percussion, clear without rales or rhonchi  Cardiovascular: RRR, heart sounds normal, no murmur or gallops, no peripheral edema  Abdomen: soft and NT, no HSM,  BS normal  Musculoskeletal: No deformities, no cyanosis or clubbing  Neuro: alert, weak in right upper extremity  Skin: Warm, no lesions or rashes      Assessment & Plan:  I personally reviewed all images and lab data in the [facility] system as well as any outside material available during this office visit and agree with the  radiology impressions.   AAA (abdominal aortic aneurysm) ([place]) History abdominal aneurysm we will watch for now  Essential hypertension Hypertension good control no change in meds  Uncontrolled type 2 diabetes mellitus with hyperglycemia (HCC) Improve glycemic control no change in diabetic meds  Tobacco use Tobacco use has been reduced  Right calf pain Right calf pain  Check D-dimer rule out DVT   Yomar was seen today for follow-up and medication refill.  Diagnoses and all orders for this visit:  Uncontrolled type 2 diabetes mellitus with hyperglycemia (HCC) -     Glucose (CBG)  Centrilobular emphysema (HCC)  Abdominal aortic aneurysm (AAA) without rupture (HCC)  Right calf pain -     D-dimer, quantitative (not at [facility])  Essential hypertension  Tobacco use  Other orders -     TRESIBA FLEXTOUCH 100 UNIT/ML FlexTouch Pen; Inject 60 Units into the skin at bedtime.  We  will set the patient up with a homebound COVID vaccine for booster

## 2020-08-27 NOTE — Progress Notes (Signed)
Medication RF [name]  New onset of lower back pain Notice x 2 days  Right leg pain

## 2020-08-28 DIAGNOSIS — R6889 Other general symptoms and signs: Secondary | ICD-10-CM | POA: Diagnosis not present

## 2020-08-28 LAB — D-DIMER, QUANTITATIVE: D-DIMER: 0.46 mg/L FEU (ref 0.00–0.49)

## 2020-09-02 DIAGNOSIS — R6889 Other general symptoms and signs: Secondary | ICD-10-CM | POA: Diagnosis not present

## 2020-09-10 ENCOUNTER — Other Ambulatory Visit: Payer: Self-pay | Admitting: Critical Care Medicine

## 2020-09-10 MED ORDER — ACCU-CHEK SOFTCLIX LANCETS MISC: 2 refills | Status: DC

## 2020-09-10 MED ORDER — UNIFINE PENTIPS PLUS 32G X 4 MM MISC: 1 refills | Status: DC

## 2020-09-10 MED ORDER — ACCU-CHEK GUIDE VI STRP: ORAL_STRIP | 1 refills | Status: DC

## 2020-09-11 ENCOUNTER — Ambulatory Visit: Payer: Medicare HMO | Attending: Critical Care Medicine

## 2020-09-11 ENCOUNTER — Other Ambulatory Visit: Payer: Self-pay

## 2020-09-11 DIAGNOSIS — Z23 Encounter for immunization: Secondary | ICD-10-CM

## 2020-09-11 MED FILL — ACCU-CHEK SOFTCLIX LANCETS: 50 days supply | Qty: 100 | Fill #0

## 2020-09-11 MED FILL — TRUEPLUS PEN NDL 32GX5/32: 32G X 4 MM | 25 days supply | Qty: 100 | Fill #0

## 2020-09-11 NOTE — Progress Notes (Signed)
   Covid-19 Vaccination Clinic  Name:  Reginald Rodriguez    MRN: [id number] DOB: [birth year]  09/11/2020  Mr. Sagan was observed post Covid-19 immunization for 15 minutes without incident. He was provided with Vaccine Information Sheet and instruction to access the V-Safe system.   Mr. Madariaga was instructed to call 911 with any severe reactions post vaccine: Marland Kitchen Difficulty breathing  . Swelling of face and throat  . A fast heartbeat  . A bad rash all over body  . Dizziness and weakness   Immunizations Administered    Name Date Dose VIS Date Route   PFIZER Comrnaty(Gray TOP) Covid-19 Vaccine 09/11/2020  3:04 PM 0.3 mL 06/27/2020 Intramuscular   Manufacturer: [place]   Lot: [id number]   NDC: [phone]

## 2020-09-12 ENCOUNTER — Ambulatory Visit: Payer: Medicare HMO

## 2020-09-16 ENCOUNTER — Emergency Department (HOSPITAL_COMMUNITY): Payer: Medicare HMO

## 2020-09-16 ENCOUNTER — Emergency Department (HOSPITAL_COMMUNITY)
Admission: EM | Admit: 2020-09-16 | Discharge: 2020-09-16 | Disposition: A | Discharge status: Home / Self Care | Payer: Medicare HMO | Attending: Emergency Medicine | Admitting: Emergency Medicine

## 2020-09-16 ENCOUNTER — Other Ambulatory Visit: Payer: Self-pay

## 2020-09-16 ENCOUNTER — Other Ambulatory Visit: Payer: Self-pay | Admitting: Emergency Medicine

## 2020-09-16 DIAGNOSIS — F17200 Nicotine dependence, unspecified, uncomplicated: Secondary | ICD-10-CM | POA: Insufficient documentation

## 2020-09-16 DIAGNOSIS — M79641 Pain in right hand: Secondary | ICD-10-CM | POA: Diagnosis not present

## 2020-09-16 DIAGNOSIS — Z794 Long term (current) use of insulin: Secondary | ICD-10-CM | POA: Diagnosis not present

## 2020-09-16 DIAGNOSIS — E1122 Type 2 diabetes mellitus with diabetic chronic kidney disease: Secondary | ICD-10-CM | POA: Insufficient documentation

## 2020-09-16 DIAGNOSIS — M1990 Unspecified osteoarthritis, unspecified site: Secondary | ICD-10-CM

## 2020-09-16 DIAGNOSIS — M79643 Pain in unspecified hand: Secondary | ICD-10-CM | POA: Diagnosis not present

## 2020-09-16 DIAGNOSIS — N183 Chronic kidney disease, stage 3 unspecified: Secondary | ICD-10-CM | POA: Insufficient documentation

## 2020-09-16 DIAGNOSIS — J449 Chronic obstructive pulmonary disease, unspecified: Secondary | ICD-10-CM | POA: Insufficient documentation

## 2020-09-16 DIAGNOSIS — M19041 Primary osteoarthritis, right hand: Secondary | ICD-10-CM | POA: Diagnosis not present

## 2020-09-16 DIAGNOSIS — Z79899 Other long term (current) drug therapy: Secondary | ICD-10-CM | POA: Diagnosis not present

## 2020-09-16 DIAGNOSIS — I10 Essential (primary) hypertension: Secondary | ICD-10-CM | POA: Diagnosis not present

## 2020-09-16 DIAGNOSIS — I129 Hypertensive chronic kidney disease with stage 1 through stage 4 chronic kidney disease, or unspecified chronic kidney disease: Secondary | ICD-10-CM | POA: Insufficient documentation

## 2020-09-16 DIAGNOSIS — Z7982 Long term (current) use of aspirin: Secondary | ICD-10-CM | POA: Insufficient documentation

## 2020-09-16 DIAGNOSIS — E161 Other hypoglycemia: Secondary | ICD-10-CM | POA: Diagnosis not present

## 2020-09-16 DIAGNOSIS — E162 Hypoglycemia, unspecified: Secondary | ICD-10-CM | POA: Diagnosis not present

## 2020-09-16 MED ORDER — LORAZEPAM 1 MG PO TABS
1.0000 mg | ORAL_TABLET | Freq: Once | ORAL | Status: AC
  Administered 2020-09-16: 1 mg via ORAL
  Filled 2020-09-16: qty 1

## 2020-09-16 MED ORDER — LORAZEPAM 0.5 MG PO TABS: 0.5000 mg | ORAL_TABLET | Freq: Three times a day (TID) | ORAL | 0 refills | Status: DC | PRN

## 2020-09-16 MED ORDER — TRAMADOL HCL 50 MG PO TABS: 50.0000 mg | ORAL_TABLET | Freq: Four times a day (QID) | ORAL | 0 refills | Status: DC | PRN

## 2020-09-16 MED ORDER — TRAMADOL HCL 50 MG PO TABS
50.0000 mg | ORAL_TABLET | Freq: Once | ORAL | Status: AC
  Administered 2020-09-16: 50 mg via ORAL
  Filled 2020-09-16: qty 1

## 2020-09-16 NOTE — Discharge Instructions (Signed)
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  Try using heat on the sore area 3-4 times a day.  We gave you 2 prescriptions, 1 for pain and 1 for muscle spasm.  Both can make you sleepy so do not drink alcohol, drive or climb ladders while taking this medicine.  See your doctor for a checkup in a week or 2.

## 2020-09-16 NOTE — ED Provider Notes (Signed)
[facility] EMERGENCY DEPARTMENT Provider Note   CSN: [id number] Arrival date & time: 09/16/20  1921     History Chief Complaint  Patient presents with  . Hand Pain    Reginald Rodriguez is a 70 y.o. male.  HPI He presents for evaluation of sudden onset of pain in his right hand, after grocery shopping today.  He has history of left brain stroke and right-sided weakness.  Pain comes and goes and is sharp and localized at his right second and CP and radiates to the fingers 2 and 3 on the right.  No known trauma.  He was transferred by EMS and during transfer received fentanyl, with partial improvement of his pain, but the pain has worsened.  There are no other known modifying factors.    No past medical history on file.  Patient Active Problem List   Diagnosis Date Noted  . Right calf pain 08/27/2020  . Tobacco use 06/27/2020  . History of cocaine abuse ([place]) 05/13/2020  . Fatty liver 05/13/2020  . 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 Arthritis:Knees, right shoulder 05/13/2020  . 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 Erectile dysfunction 05/13/2020  . OSA (obstructive sleep apnea) 05/13/2020  . Onychomycosis of multiple toenails with type 2 diabetes mellitus ([place]) 05/13/2020  . AAA (abdominal aortic aneurysm) ([place]) 05/13/2020  . CKD (chronic kidney disease) stage 3, GFR 30-59 ml/min (HCC) 04/16/2020  . Poor dentition 04/15/2020  . 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 Uncontrolled type 2 diabetes mellitus with hyperglycemia ([place]) 04/15/2020  . Essential hypertension 04/15/2020    No past surgical history on file.     No family history on file.  Social History   Tobacco Use  . Smoking status: Current Every Day Smoker  . Smokeless tobacco: Former Network engineer  . Vaping Use: Never used  Substance Use Topics  . Alcohol use: Not Currently  . Drug use: Not Currently    Home Medications Prior to Admission  medications   Medication Sig Start Date End Date Taking? Authorizing Provider  LORazepam (ATIVAN) 0.5 MG tablet Take 1 tablet (0.5 mg total) by mouth 3 (three) times daily as needed (Muscle spasm). 09/16/20  Yes Daleen Bo, MD  traMADol (ULTRAM) 50 MG tablet Take 1 tablet (50 mg total) by mouth every 6 (six) hours as needed for moderate pain. 09/16/20  Yes Daleen Bo, MD  ACCU-CHEK GUIDE test strip USE TO check blood sugar 2 TIMES DAILY 09/10/20   Elsie Stain, MD  Accu-Chek Softclix Lancets lancets Use to check blood sugar BID. 09/10/20   Elsie Stain, MD  amLODipine (NORVASC) 5 MG tablet Take 1 tablet (5 mg total) by mouth daily. 08/27/20   Elsie Stain, MD  aspirin 81 MG EC tablet Take 1 tablet (81 mg total) by mouth daily. 08/27/20   Elsie Stain, MD  atorvastatin (LIPITOR) 10 MG tablet Take 1 tablet (10 mg total) by mouth daily. 08/27/20   Elsie Stain, MD  Blood Glucose Monitoring Suppl (ACCU-CHEK GUIDE) w/Device KIT Use to test blood sugar 04/26/20   Elsie Stain, MD  chlorthalidone (HYGROTON) 25 MG tablet Take 1 tablet (25 mg total) by mouth daily. 08/27/20   Elsie Stain, MD  Dulaglutide (TRULICITY) 2.72 ZD/6.6YQ SOPN Inject 0.75 mg into the skin once a week. 08/27/20   Elsie Stain, MD  fenofibrate 54 MG tablet Take 1 tablet (54 mg total) by mouth  daily. 08/27/20   Elsie Stain, MD  gabapentin (NEURONTIN) 100 MG capsule Take 1 capsule (100 mg total) by mouth 2 (two) times daily. 08/27/20 11/25/20  Elsie Stain, MD  lactulose (CHRONULAC) 10 GM/15ML solution Take 10 g by mouth daily. Patient not taking: Reported on 08/27/2020 03/28/20   [provider]  nicotine (NICODERM CQ - DOSED IN MG/24 HOURS) 21 mg/24hr patch Place 1 patch (21 mg total) onto the skin daily. 05/01/20   Elsie Stain, MD  NOVOLOG FLEXPEN 100 UNIT/ML FlexPen Use 30 units at breakfast   30 units at lunch 20 units at dinner 08/27/20   Elsie Stain, MD  TRESIBA FLEXTOUCH 100  UNIT/ML FlexTouch Pen Inject 60 Units into the skin at bedtime. 08/27/20   Elsie Stain, MD  TRESIBA FLEXTOUCH 100 UNIT/ML FlexTouch Pen Inject 60 Units into the skin at bedtime. 08/27/20   Elsie Stain, MD  UNIFINE PENTIPS PLUS 32G X 4 MM MISC USE 4 TIMES DAILY AS DIRECTED 09/10/20   Elsie Stain, MD    Allergies    Bee venom and Penicillins  Review of Systems   Review of Systems  All other systems reviewed and are negative.   Physical Exam Updated Vital Signs BP (!) 124/93 (BP Location: Left Arm)   Pulse 92   Temp 97.9 F (36.6 C) (Oral)   Resp 16   Ht $R'5\' 7"'XD$  (1.702 m)   Wt 106.6 kg   SpO2 94%   BMI 36.81 kg/m   Physical Exam Vitals and nursing note reviewed.  Constitutional:      General: He is not in acute distress.    Appearance: He is well-developed and well-nourished. He is not ill-appearing, toxic-appearing or diaphoretic.  HENT:     Head: Normocephalic and atraumatic.     Right Ear: External ear normal.     Left Ear: External ear normal.  Eyes:     Extraocular Movements: EOM normal.     Conjunctiva/sclera: Conjunctivae normal.     Pupils: Pupils are equal, round, and reactive to light.  Neck:     Trachea: Phonation normal.  Cardiovascular:     Rate and Rhythm: Normal rate and regular rhythm.     Heart sounds: Normal heart sounds.  Pulmonary:     Effort: Pulmonary effort is normal.     Breath sounds: Normal breath sounds.  Chest:     Chest wall: No bony tenderness.  Abdominal:     Palpations: Abdomen is soft.     Tenderness: There is no abdominal tenderness.  Musculoskeletal:        General: Normal range of motion.     Cervical back: Normal range of motion and neck supple.  Skin:    General: Skin is warm, dry and intact.  Neurological:     Mental Status: He is alert and oriented to person, place, and time.     Cranial Nerves: No cranial nerve deficit.     Sensory: No sensory deficit.     Motor: No abnormal muscle tone.     Coordination:  Coordination normal.  Psychiatric:        Mood and Affect: Mood and affect normal.        Behavior: Behavior normal.        Thought Content: Thought content normal.        Judgment: Judgment normal.     ED Results / Procedures / Treatments   Labs (all labs ordered are listed, but only  abnormal results are displayed) Labs Reviewed - No data to display  EKG None  Radiology DG Hand Complete Right  Result Date: 09/16/2020 CLINICAL DATA:  Right hand pain radiating to elbow EXAM: RIGHT HAND - COMPLETE 3+ VIEW COMPARISON:  None. FINDINGS: Frontal, oblique, and lateral views of the right hand are obtained. No acute fracture, subluxation, or dislocation. Mild diffuse interphalangeal joint space narrowing consistent with osteoarthritis. Soft tissues are unremarkable. IMPRESSION: 1. Osteoarthritis.  No acute bony abnormality. Electronically Signed   By: Randa Ngo M.D.   On: 09/16/2020 20:31    Procedures Procedures   Medications Ordered in ED Medications  traMADol (ULTRAM) tablet 50 mg (50 mg Oral Given 09/16/20 2205)  LORazepam (ATIVAN) tablet 1 mg (1 mg Oral Given 09/16/20 2205)    ED Course  I have reviewed the triage vital signs and the nursing notes.  Pertinent labs & imaging results that were available during my care of the patient were reviewed by me and considered in my medical decision making (see chart for details).    MDM Rules/Calculators/A&P                           Patient Vitals for the past 24 hrs:  BP Temp Temp src Pulse Resp SpO2 Height Weight  09/16/20 2008 -- -- -- -- -- -- $Rem'5\' 7"'nxQT$  (1.702 m) 106.6 kg  09/16/20 1923 (!) 124/93 97.9 F (36.6 C) Oral 92 16 94 % -- --    11:05 PM Reevaluation with update and discussion. After initial assessment and treatment, an updated evaluation reveals right hip pain has resolved completely.  Findings discussed and questions answered. Daleen Bo   Medical Decision Making:  This patient is presenting for evaluation of  hand pain, without trauma, which does require a range of treatment options, and is a complaint that involves a moderate risk of morbidity and mortality. The differential diagnoses include spasm, arthritis, neuropathy. I decided to review old records, and in summary Ehly male presenting with atraumatic head pain, with history of left brain stroke and right-sided weakness I did not require additional historical information from anyone.  Radiologic Tests Ordered, included x-ray right hand.  I independently Visualized: Radiograph images, which show mild phalangeal arthritis   Critical Interventions-clinical evaluation, medication treatment, radiography, observation reassess  After These Interventions, the Patient was reevaluated and was found stable for discharge.  Nonspecific hand pain with arthritis, possibly spasm related and associated with prior weakness after stroke  CRITICAL CARE-no Performed by: Daleen Bo  Nursing Notes Reviewed/ Care Coordinated Applicable Imaging Reviewed Interpretation of Laboratory Data incorporated into ED treatment  The patient appears reasonably screened and/or stabilized for discharge and I doubt any other medical condition or other [facility] requiring further screening, evaluation, or treatment in the ED at this time prior to discharge.  Plan: Home Medications-continue usual medication; Home Treatments-heat to affected area; return here if the recommended treatment, does not improve the symptoms; Recommended follow up-PCP, as needed     Final Clinical Impression(s) / ED Diagnoses Final diagnoses:  Pain of right hand  Osteoarthritis, unspecified osteoarthritis type, unspecified site    Rx / DC Orders ED Discharge Orders         Ordered    traMADol (ULTRAM) 50 MG tablet  Every 6 hours PRN        09/16/20 2312    LORazepam (ATIVAN) 0.5 MG tablet  3 times daily PRN  09/16/20 2312           Daleen Bo, MD 09/17/20 [phone]

## 2020-09-16 NOTE — ED Triage Notes (Signed)
Pt reports sudden onset of right hand pain that was "shooting up his elbow," per EMS.  Gave 115mcg of fentanyl on the way to hospital.

## 2020-09-17 ENCOUNTER — Telehealth: Payer: Self-pay | Admitting: Critical Care Medicine

## 2020-09-17 MED FILL — traMADol HCL 50 MG TABS: 50 | 3 days supply | Qty: 15 | Fill #0

## 2020-09-17 NOTE — Telephone Encounter (Signed)
I spoke to the patient who was in the ER last night for hand pain it appears to be arthritis in nature and not a stroke  He was given a prescription for tramadol to our pharmacy I told him to pick that up they also wrote Ativan which would not carry I told the patient to hold off on that I asked for him to be seen sooner with me face-to-face

## 2020-09-17 NOTE — Telephone Encounter (Signed)
Scheduled soonest available double book (10/15/2020).

## 2020-09-18 ENCOUNTER — Telehealth: Payer: Self-pay

## 2020-09-18 NOTE — Telephone Encounter (Signed)
Met with the patient when he came to the clinic pharmacy today. He stated that he never heard from [facility] about transportation and when he contacted them they said they did not receive a referral Explained to him that the referral was faxed to [facility] 06/27/2020.  However, since the application was submitted he has a new phone number and address.  A copy of the application was faxed with the updated demographic information to [facility] [place] eligibility.

## 2020-09-26 ENCOUNTER — Telehealth: Payer: Self-pay | Admitting: Critical Care Medicine

## 2020-09-26 MED ORDER — ACCU-CHEK GUIDE VI STRP: ORAL_STRIP | 1 refills | Status: DC

## 2020-09-26 NOTE — Telephone Encounter (Signed)
Pt needs test strips.   Refilled to [facility]

## 2020-10-15 ENCOUNTER — Telehealth: Payer: Self-pay

## 2020-10-15 ENCOUNTER — Encounter: Payer: Self-pay | Admitting: Critical Care Medicine

## 2020-10-15 ENCOUNTER — Other Ambulatory Visit: Payer: Self-pay | Admitting: Critical Care Medicine

## 2020-10-15 ENCOUNTER — Ambulatory Visit: Payer: Medicare HMO | Attending: Critical Care Medicine | Admitting: Critical Care Medicine

## 2020-10-15 ENCOUNTER — Other Ambulatory Visit: Payer: Self-pay

## 2020-10-15 VITALS — BP 126/96 | HR 101 | Resp 20 | Ht 67.0 in | Wt 242.6 lb

## 2020-10-15 DIAGNOSIS — F17210 Nicotine dependence, cigarettes, uncomplicated: Secondary | ICD-10-CM

## 2020-10-15 DIAGNOSIS — E1165 Type 2 diabetes mellitus with hyperglycemia: Secondary | ICD-10-CM

## 2020-10-15 DIAGNOSIS — I10 Essential (primary) hypertension: Secondary | ICD-10-CM

## 2020-10-15 DIAGNOSIS — N1832 Chronic kidney disease, stage 3b: Secondary | ICD-10-CM

## 2020-10-15 DIAGNOSIS — Z720 Tobacco use: Secondary | ICD-10-CM

## 2020-10-15 LAB — POCT GLYCOSYLATED HEMOGLOBIN (HGB A1C): HbA1c, POC (controlled diabetic range): 8.5 % — AB (ref 0.0–7.0)

## 2020-10-15 LAB — GLUCOSE, POCT (MANUAL RESULT ENTRY): POC Glucose: 194 mg/dl — AB (ref 70–99)

## 2020-10-15 MED ORDER — UNIFINE PENTIPS PLUS 32G X 4 MM MISC: 1 refills | Status: DC

## 2020-10-15 MED ORDER — NOVOLOG FLEXPEN 100 UNIT/ML ~~LOC~~ SOPN
PEN_INJECTOR | SUBCUTANEOUS | 3 refills | Status: DC
Start: 2020-10-15 — End: 2020-11-28

## 2020-10-15 MED ORDER — ACCU-CHEK GUIDE VI STRP
ORAL_STRIP | 1 refills | Status: DC
Start: 2020-10-15 — End: 2020-11-20

## 2020-10-15 MED ORDER — NICOTINE 21 MG/24HR TD PT24
21.0000 mg | MEDICATED_PATCH | Freq: Every day | TRANSDERMAL | 0 refills | Status: DC
Start: 2020-10-15 — End: 2020-12-17

## 2020-10-15 MED FILL — BD PEN NDL NANO 32GX5/32: 32G X 4 MM | 25 days supply | Qty: 100 | Fill #0

## 2020-10-15 NOTE — Patient Instructions (Signed)
Increase NovoLog to 35 units at breakfast and lunch 20 units at dinner will stay the same  No other medication changes  Pick up your testing supplies when you need them scripts were sent to our pharmacy  Nicotine patch was sent to apply daily to our pharmacy  New supply of pen needles sent  Return to see Dr. Joya Gaskins 2 months

## 2020-10-15 NOTE — Assessment & Plan Note (Signed)
  .   Current smoking consumption amount: Several cigarettes a day  . Dicsussion on advise to quit smoking and smoking impacts: Cardiovascular impact  . Patient's willingness to quit: Willing to quit  . Methods to quit smoking discussed: Nicotine replacement  . Medication management of smoking session drugs discussed: Nicotine replacement  . Resources provided:  AVS   . Setting quit date not established  . Follow-up arranged 2 months   Time spent counseling the patient: 5 minutes

## 2020-10-15 NOTE — Assessment & Plan Note (Signed)
Monitor

## 2020-10-15 NOTE — Assessment & Plan Note (Signed)
Plan to increase novolog and  [place] dosing

## 2020-10-15 NOTE — Assessment & Plan Note (Signed)
No change in blood pressure dosing

## 2020-10-15 NOTE — Telephone Encounter (Signed)
Met with the patient when he was in the clinic today.  He said that he has still not heard from [place] about transportation.    Call placed to [facility] , message left with call back requested to this CM.  Need to check the status of the referral.

## 2020-10-15 NOTE — Progress Notes (Signed)
Subjective:    Patient ID: Reginald Rodriguez, male    DOB: 12/14/1950, 70 y.o.   MRN: [id number]  03/28/20 This is a 70 year old male who is seen in the [facility] clinic and is a new patient to our area.  He just arrived at the shelter 2 weeks ago.  He moved up from [place] where his primary care provider is Dr. Tammi Klippel and he also has a diabetologist Pecola Leisure at the [facility].  This patient has had longstanding diabetes hypertension previous stroke.  This patient states his current medications include Tresiba 60 units daily, Humalog 20 units 3 times daily as needed for blood sugars greater than 250, amlodipine 5 mg daily, Lipitor 10 mg daily, hydrocal thiazide 25 mg daily, gabapentin 100 mg twice daily, lactulose 15 mL daily, fenofibrate 54 mg daily.  The patient does not have a glucometer at this time.  He is allergic to penicillin.  He states he has had some headaches and some degree of constipation at this time.  On exam blood sugar is greater than 400 on meter saturation 98% pulse 79 blood pressure is 150/100  Chest is clear abdomen benign benign cardiac exam was unremarkable  Blood sugar greater than 400 on CBG  Impression is that of prior stroke, hypertension, diabetes type 2  Plan will be to [facility] and I will obtain records from his clinic and send a record release which she signed at this visit we will also attempt a get him into our clinic for further follow-up  04/15/2020 Patient is seen today in the clinic for the first time having just moved from [place].  On arrival A1c is 7.7 blood sugar 138.  Longstanding history of hypertension diabetes prior stroke 3 years ago with residual defect with right upper and lower extremity weakness.  On arrival blood pressure is good at 125/86.  Patient uses a cane to ambulate is requesting a Rollator possible.  Patient needs primary care assessments including  hepatitis C assay.  He did have a colonoscopy 3 years ago was negative.  He smokes a pack every 2 days at the shelter.  He has no other new complaints.  06/27/2020 This patient is seen in return follow-up is homeless has type 2 diabetes note his A1c is up to 9.7 he has been missing doses of insulin  Patient also lost his eyeglasses and will need a new set of glasses  He has no other specific complaints at this visit he is still smoking 1 pack every 2 days at the homeless shelter  He is due a Covid booster he states he will pursue  08/27/2020 Pt seen in f/u and doing well so far with diabetes.   The patient has lost some weight his glycemic control is better he brings his meter in blood sugar fasting is 105 postprandial in the 140s.  He now has glasses and able to see better with the glasses.  He complains of some right lower extremity weakness and left back pain.  No other complaints are voiced at this time Patient complains of right lower calf pain  10/15/2020 Patient seen in return follow-up for his diabetes previous history of stroke he complains of left and right thumb pain which improves with motion.  On arrival A1c is 8.5 down from 9.7 last year blood sugar is 193 he normally runs in the 200 range at home and he is on [place] and Trul[place]  Patient has no other complaints at this time    History reviewed. No pertinent past medical history.   History reviewed. No pertinent family history.   Social History   Socioeconomic History  . Marital status: Single    Spouse name: Not on file  . Number of children: Not on file  . Years of education: Not on file  . Highest education level: Not on file  Occupational History  . Not on file  Tobacco Use  . Smoking status: Current Every Day Smoker  . Smokeless tobacco: Former Network engineer  . Vaping Use: Never used  Substance and Sexual Activity  . Alcohol use: Not Currently  . Drug use: Not Currently  . Sexual activity: Not  on file  Other Topics Concern  . Not on file  Social History Narrative  . Not on file   Social Determinants of Health   Financial Resource Strain: Not on file  Food Insecurity: Not on file  Transportation Needs: Not on file  Physical Activity: Not on file  Stress: Not on file  Social Connections: Not on file  Intimate Partner Violence: Not on file     Allergies  Allergen Reactions  . Bee Venom Swelling  . Penicillins Rash     Outpatient Medications Prior to Visit  Medication Sig Dispense Refill  . Accu-Chek Softclix Lancets lancets Use to check blood sugar BID. 100 each 2  . amLODipine (NORVASC) 5 MG tablet Take 1 tablet (5 mg total) by mouth daily. 90 tablet 1  . aspirin 81 MG EC tablet Take 1 tablet (81 mg total) by mouth daily. 100 tablet 1  . atorvastatin (LIPITOR) 10 MG tablet Take 1 tablet (10 mg total) by mouth daily. 90 tablet 2  . chlorthalidone (HYGROTON) 25 MG tablet Take 1 tablet (25 mg total) by mouth daily. 90 tablet 1  . Dulaglutide (TRULICITY) 1.75 ZW/2.5EN SOPN Inject 0.75 mg into the skin once a week. 6 mL 1  . fenofibrate 54 MG tablet Take 1 tablet (54 mg total) by mouth daily. 90 tablet 1  . gabapentin (NEURONTIN) 100 MG capsule Take 1 capsule (100 mg total) by mouth 2 (two) times daily. 180 capsule 1  . lactulose (CHRONULAC) 10 GM/15ML solution Take 10 g by mouth daily.    Marland Kitchen LORazepam (ATIVAN) 0.5 MG tablet Take 1 tablet (0.5 mg total) by mouth 3 (three) times daily as needed (Muscle spasm). 15 tablet 0  . traMADol (ULTRAM) 50 MG tablet Take 1 tablet (50 mg total) by mouth every 6 (six) hours as needed for moderate pain. 15 tablet 0  . TRESIBA FLEXTOUCH 100 UNIT/ML FlexTouch Pen Inject 60 Units into the skin at bedtime. 27 each 0  . ACCU-CHEK GUIDE test strip USE TO check blood sugar 2 TIMES DAILY 100 each 1  . Blood Glucose Monitoring Suppl (ACCU-CHEK GUIDE) w/Device KIT Use to test blood sugar 1 kit 0  . NOVOLOG FLEXPEN 100 UNIT/ML FlexPen Use 30 units at  breakfast   30 units at lunch 20 units at dinner 15 mL 3  . TRESIBA FLEXTOUCH 100 UNIT/ML FlexTouch Pen Inject 60 Units into the skin at bedtime. 54 mL 1  . UNIFINE PENTIPS PLUS 32G X 4 MM MISC USE 4 TIMES DAILY AS DIRECTED 100 each 1  . Blood Glucose Monitoring Suppl (ACCU-CHEK GUIDE) w/Device KIT USE TO TEST BLOOD SUGAR    . nicotine (NICODERM CQ - DOSED IN MG/24 HOURS) 21 mg/24hr patch Place 1 patch (21 mg  total) onto the skin daily. (Patient not taking: Reported on 10/15/2020) 28 patch 0   No facility-administered medications prior to visit.      Review of Systems  Constitutional: Negative.   HENT: Positive for dental problem.   Eyes: Negative for visual disturbance.  Cardiovascular: Negative for chest pain.  Gastrointestinal: Negative.   Endocrine: Negative.   Genitourinary: Negative.   Musculoskeletal: Negative.   Skin: Negative.   Neurological: Positive for numbness. Negative for facial asymmetry, speech difficulty and weakness.       Right leg weak s/p CVA  Psychiatric/Behavioral: The patient is not nervous/anxious.       Objective:   Physical Exam Vitals:   10/15/20 0902  BP: (!) 126/96  Pulse: (!) 101  Resp: 20  SpO2: 98%  Weight: 242 lb 9.6 oz (110 kg)  Height: $Remove'5\' 7"'yFRCFAV$  (1.702 m)    Gen: Pleasant, obese, in no distress,  normal affect  ENT: No lesions,  mouth clear,  oropharynx clear, no postnasal drip  Neck: No JVD, no TMG, no carotid bruits  Lungs: No use of accessory muscles, no dullness to percussion, clear without rales or rhonchi  Cardiovascular: RRR, heart sounds normal, no murmur or gallops, no peripheral edema  Abdomen: soft and NT, no HSM,  BS normal  Musculoskeletal: No deformities, no cyanosis or clubbing  Neuro: alert, weak in right upper extremity  Skin: Warm, no lesions or rashes      Assessment & Plan:  I personally reviewed all images and lab data in the [facility] system as well as any outside material available during this office visit and  agree with the  radiology impressions.   Uncontrolled type 2 diabetes mellitus with hyperglycemia (HCC) Plan to increase novolog and  Tresiba dosing  Essential hypertension No change in blood pressure dosing  CKD (chronic kidney disease) stage 3, GFR 30-59 ml/min Monitor  Tobacco use    . Current smoking consumption amount: Several cigarettes a day  . Dicsussion on advise to quit smoking and smoking impacts: Cardiovascular impact  . Patient's willingness to quit: Willing to quit  . Methods to quit smoking discussed: Nicotine replacement  . Medication management of smoking session drugs discussed: Nicotine replacement  . Resources provided:  AVS   . Setting quit date not established  . Follow-up arranged 2 months   Time spent counseling the patient: 5 minutes    Mikhi was seen today for hand pain.  Diagnoses and all orders for this visit:  Uncontrolled type 2 diabetes mellitus with hyperglycemia (HCC) -     POCT glucose (manual entry) -     POCT glycosylated hemoglobin (Hb A1C)  Essential hypertension  Stage 3b chronic kidney disease (HCC)  Tobacco use  Other orders -     UNIFINE PENTIPS PLUS 32G X 4 MM MISC; USE 4 TIMES DAILY AS DIRECTED -     ACCU-CHEK GUIDE test strip; USE TO check blood sugar 2 TIMES DAILY -     nicotine (NICODERM CQ - DOSED IN MG/24 HOURS) 21 mg/24hr patch; Place 1 patch (21 mg total) onto the skin daily. -     NOVOLOG FLEXPEN 100 UNIT/ML FlexPen; Use 35 units at breakfast   35 units at lunch 20 units at dinner  Patient did get his Covid booster

## 2020-10-24 ENCOUNTER — Other Ambulatory Visit: Payer: Self-pay

## 2020-10-24 MED FILL — Lancets: 50 days supply | Qty: 100 | Fill #0 | Status: AC

## 2020-10-24 MED FILL — Glucose Blood Test Strip: 50 days supply | Qty: 100 | Fill #0 | Status: CN

## 2020-10-25 ENCOUNTER — Other Ambulatory Visit: Payer: Self-pay

## 2020-10-31 ENCOUNTER — Other Ambulatory Visit: Payer: Self-pay | Admitting: Critical Care Medicine

## 2020-10-31 ENCOUNTER — Other Ambulatory Visit: Payer: Self-pay

## 2020-10-31 MED ORDER — ACCU-CHEK SOFTCLIX LANCETS MISC
2 refills | Status: DC
  Filled 2020-10-31: qty 100, 50d supply, fill #0

## 2020-10-31 NOTE — Telephone Encounter (Signed)
Copied from [place] [id number]. Topic: Quick Communication - Rx Refill/Question >> Oct 31, 2020  9:53 AM Tessa Lerner A wrote: Medication: Accu-Chek Softclix Lancets lancets [[id number]]   Has the patient contacted their pharmacy? No. Patient has attempted to make contact with their pharmacy and been unsuccessful.   Preferred Pharmacy (with phone number or street name): [facility] and [place]  Phone:  [phone] Fax:  [phone]  Agent: Please be advised that RX refills may take up to 3 business days. We ask that you follow-up with your pharmacy.

## 2020-11-05 ENCOUNTER — Telehealth: Payer: Self-pay

## 2020-11-05 NOTE — Telephone Encounter (Signed)
Call received from [place] who confirmed that the patient has been approved for services. She spoke to him yesterday and he is approved through 10/2023. She explained to him that he can start to schedule rides.

## 2020-11-18 ENCOUNTER — Other Ambulatory Visit: Payer: Self-pay | Admitting: Critical Care Medicine

## 2020-11-18 ENCOUNTER — Other Ambulatory Visit: Payer: Self-pay

## 2020-11-18 MED ORDER — BD PEN NEEDLE NANO U/F 32G X 4 MM MISC
1 refills | Status: DC
  Filled 2020-11-18: qty 100, 25d supply, fill #0

## 2020-11-18 NOTE — Telephone Encounter (Signed)
Medication Refill - Medication: BD PEN NEEDLE NANO U/F 32G X 4 MM MISC     Preferred Pharmacy (with phone number or street name):  [facility] and [place] Phone:  [phone]  Fax:  [phone]       Agent: Please be advised that RX refills may take up to 3 business days. We ask that you follow-up with your pharmacy.

## 2020-11-19 ENCOUNTER — Other Ambulatory Visit: Payer: Self-pay

## 2020-11-20 ENCOUNTER — Telehealth: Payer: Self-pay | Admitting: Critical Care Medicine

## 2020-11-20 ENCOUNTER — Other Ambulatory Visit: Payer: Self-pay

## 2020-11-20 MED ORDER — ACCU-CHEK GUIDE VI STRP
ORAL_STRIP | 2 refills | Status: DC
  Filled 2020-11-20: qty 100, 50d supply, fill #0
  Filled 2021-01-30: qty 100, 50d supply, fill #1
  Filled 2021-03-19: qty 100, 50d supply, fill #2

## 2020-11-20 MED ORDER — BD PEN NEEDLE NANO U/F 32G X 4 MM MISC
1 refills | Status: DC
  Filled 2020-11-20: qty 100, fill #0
  Filled 2020-11-20: qty 100, 25d supply, fill #0
  Filled 2020-12-17 – 2020-12-20 (×2): qty 100, 25d supply, fill #1

## 2020-11-20 MED ORDER — ACCU-CHEK SOFTCLIX LANCETS MISC
2 refills | Status: DC
  Filled 2020-11-20: qty 100, 50d supply, fill #0
  Filled 2020-11-20: qty 100, fill #0
  Filled 2020-12-11: qty 100, 50d supply, fill #0
  Filled 2021-03-19: qty 100, 50d supply, fill #1

## 2020-11-20 NOTE — Telephone Encounter (Signed)
Pt called , said I cannot get through to our pharmacy to request refills on his pen needles, testing supplies  Luke, pls f/u on this today, I reordered even though refills appear current  He also needs an appt with you in next week to go over his DM  His CBGs have been high  Thanks

## 2020-11-20 NOTE — Telephone Encounter (Signed)
Scheduled Mon May 9th @ 2pm and advised patient refills had been sent.   Patient requested to be transferred to pharmacy to know exactly what time they would be ready due to transportation arrangements. Transferred patient to pharmacy.

## 2020-11-20 NOTE — Telephone Encounter (Signed)
Will forward to scheduling.   Mallory,   Can we get this patient on my schedule for next week for diabetes follow-up per Dr. Joya Gaskins?

## 2020-11-21 ENCOUNTER — Other Ambulatory Visit: Payer: Self-pay

## 2020-11-25 ENCOUNTER — Telehealth: Payer: Self-pay

## 2020-11-25 ENCOUNTER — Other Ambulatory Visit: Payer: Self-pay

## 2020-11-25 ENCOUNTER — Encounter: Payer: Self-pay | Admitting: Pharmacist

## 2020-11-25 ENCOUNTER — Ambulatory Visit: Payer: Medicare HMO | Attending: Critical Care Medicine | Admitting: Pharmacist

## 2020-11-25 DIAGNOSIS — E1165 Type 2 diabetes mellitus with hyperglycemia: Secondary | ICD-10-CM | POA: Diagnosis not present

## 2020-11-25 LAB — GLUCOSE, POCT (MANUAL RESULT ENTRY): POC Glucose: 227 mg/dl — AB (ref 70–99)

## 2020-11-25 MED ORDER — TRULICITY 1.5 MG/0.5ML ~~LOC~~ SOAJ
1.5000 mg | SUBCUTANEOUS | 2 refills | Status: DC
  Filled 2020-11-25: qty 2, 28d supply, fill #0

## 2020-11-25 NOTE — Telephone Encounter (Signed)
Met with the patient when he was at [facility] today. He was upset with [facility].   He received written confirmation that he has been approved for services for 3 years; however, he was not aware that he had to pay for rides.   He tore up the papers that he received and said that he wanted nothing to do with them and he will just use the bus or cab.  This CM reminded him that he can still call [facility] if he changes his mind about the rides. He said he understood but did not want their services.

## 2020-11-25 NOTE — Progress Notes (Signed)
    S:     No chief complaint on file.  Patient arrives in good spirits.  Presents for diabetes evaluation, education, and management Patient was referred and last seen by Primary Care Provider on 10/15/2020.   Patient reports Diabetes was diagnosed in >5 years ago. He is somewhat of a poor historian d/t residual effects of a prior stroke. He reports good medication adherence but continues to experience polyuria, sx of neuropathy, and blurred vision (occasionally). His diet is limited as he sometimes can eat only what he can get. Mobility is limited so he is unable to participate in regular aerobic exercise.   Family/Social History:  - FHx: no pertinent positives  - Tobacco: current everyday smoker  - Alcohol: none currently   Insurance coverage/medication affordability: [employer]  Medication adherence reported.   Current diabetes medications include: Novolog 35/35/20 TID before meals, Tresiba 60 units at bedtime, Trulicity 3.41 mg weekly   Patient denies hypoglycemic events. Hypogylcemia awareness occurs in the 80-90 range.   Patient reported dietary habits:  - Limited based on availability - For example, this morning he ate a ham sandwich with white bread   Patient-reported exercise habits:  - Limited d/t limited availability.    Patient reports nocturia (nighttime urination).  Patient reports neuropathy (nerve pain). Patient reports visual changes. Patient denies self foot exams.     O:  POCT: 227  Lab Results  Component Value Date   HGBA1C 8.5 (A) 10/15/2020   There were no vitals filed for this visit.  Lipid Panel     Component Value Date/Time   CHOL 139 06/27/2020 1223   TRIG 193 (H) 06/27/2020 1223   HDL 32 (L) 06/27/2020 1223   CHOLHDL 4.3 06/27/2020 1223   LDLCALC 74 06/27/2020 1223   Home fasting blood sugars: 80s - 150s   2 hour post-meal/random blood sugars: 200+ (max is 337).  Clinical Atherosclerotic Cardiovascular Disease (ASCVD): Yes  - prior  stroke  The 10-year ASCVD risk score Mikey Bussing DC Jr., et al., 2013) is: 46.3%   Values used to calculate the score:     Age: 70 years     Sex: Male     Is Non-Hispanic African American: Yes     Diabetic: Yes     Tobacco smoker: Yes     Systolic Blood Pressure: 937 mmHg     Is BP treated: Yes     HDL Cholesterol: 32 mg/dL     Total Cholesterol: 139 mg/dL    A/P: Diabetes longstanding currently uncontrolled. Patient is able to verbalize appropriate hypoglycemia management plan. Medication adherence appears appropriate. Control is suboptimal due to limited mobility and dietary indiscretion. Kidney function is not 1005 but not contraindicative to metformin use. He does have a fatty liver but LFTs are WNL from 06/2020. Will address possible addition of metformin to regimen at follow-up.  -Increased dose of Trulicity to 1.5 mg weekly.  -Continue current doses of Humalog and Tresiba for now.  -Extensively discussed pathophysiology of diabetes, recommended lifestyle interventions, dietary effects on blood sugar control -Counseled on s/sx of and management of hypoglycemia -Next A1C anticipated 12/2020.   Written patient instructions provided.  Total time in face to face counseling 30 minutes.   Follow up Pharmacist Clinic Visit in 4 weeks.     Benard Halsted, PharmD, [facility] [place], [place] [phone]

## 2020-11-26 ENCOUNTER — Ambulatory Visit: Payer: Medicare HMO | Admitting: Critical Care Medicine

## 2020-11-27 ENCOUNTER — Other Ambulatory Visit: Payer: Self-pay | Admitting: Critical Care Medicine

## 2020-11-27 NOTE — Telephone Encounter (Signed)
Requested medication (s) are due for refill today- unsure  Requested medication (s) are on the active medication list -yes- listed multiple times  Future visit scheduled - yes  Last refill: 09/27/20  Notes to clinic: Request for RF sent for review- alert on request states Rx discontinued- it is listed multiple times on medication list  Requested Prescriptions  Pending Prescriptions Disp Refills   TRESIBA FLEXTOUCH 100 UNIT/ML FlexTouch Pen [Pharmacy Med Name: TRESIBA FLEXTOUCH 100 UNIT/ML Solution Pen-injector] 60 mL     Sig: INJECT 60 UNITS SUBCUTANEOUSLY AT BEDTIME.      Endocrinology:  Diabetes - Insulins Failed - 11/27/2020  1:29 PM      Failed - HBA1C is between 0 and 7.9 and within 180 days    HbA1c, POC (controlled diabetic range)  Date Value Ref Range Status  10/15/2020 8.5 (A) 0.0 - 7.0 % Final          Passed - Valid encounter within last 6 months    Recent Outpatient Visits           2 days ago Uncontrolled type 2 diabetes mellitus with hyperglycemia [facility])   [place], Annie Main L, RPH-CPP   1 month ago Uncontrolled type 2 diabetes mellitus with hyperglycemia [facility])   [place] Elsie Stain, MD   3 months ago Uncontrolled type 2 diabetes mellitus with hyperglycemia [facility])   [place] Elsie Stain, MD   5 months ago Uncontrolled type 2 diabetes mellitus with hyperglycemia [facility])   [place] Elsie Stain, MD   7 months ago Need for influenza vaccination   [place], RPH-CPP       Future Appointments             In 2 weeks Elsie Stain, MD [place]   In 3 weeks Daisy Blossom, Jarome Matin, [place]                 Requested Prescriptions  Pending Prescriptions Disp Refills    TRESIBA FLEXTOUCH 100 UNIT/ML FlexTouch Pen [Pharmacy Med Name: TRESIBA FLEXTOUCH 100 UNIT/ML Solution Pen-injector] 60 mL     Sig: INJECT 60 UNITS SUBCUTANEOUSLY AT BEDTIME.      Endocrinology:  Diabetes - Insulins Failed - 11/27/2020  1:29 PM      Failed - HBA1C is between 0 and 7.9 and within 180 days    HbA1c, POC (controlled diabetic range)  Date Value Ref Range Status  10/15/2020 8.5 (A) 0.0 - 7.0 % Final          Passed - Valid encounter within last 6 months    Recent Outpatient Visits           2 days ago Uncontrolled type 2 diabetes mellitus with hyperglycemia [facility])   [place], Annie Main L, RPH-CPP   1 month ago Uncontrolled type 2 diabetes mellitus with hyperglycemia [facility])   [place] Elsie Stain, MD   3 months ago Uncontrolled type 2 diabetes mellitus with hyperglycemia [facility])   [place] Elsie Stain, MD   5 months ago Uncontrolled type 2 diabetes mellitus with hyperglycemia [facility])   [place] Elsie Stain, MD   7 months  ago Need for influenza vaccination   [place], RPH-CPP       Future Appointments             In 2 weeks Joya Gaskins Burnett Harry, MD [place]   In 3 weeks Daisy Blossom, Jarome Matin, [place]

## 2020-11-28 ENCOUNTER — Other Ambulatory Visit: Payer: Self-pay

## 2020-11-28 ENCOUNTER — Telehealth: Payer: Self-pay | Admitting: Critical Care Medicine

## 2020-11-28 MED ORDER — NOVOLOG FLEXPEN 100 UNIT/ML ~~LOC~~ SOPN
PEN_INJECTOR | SUBCUTANEOUS | 3 refills | Status: DC
  Filled 2020-11-28: qty 30, 33d supply, fill #0

## 2020-11-28 NOTE — Telephone Encounter (Signed)
His coverage includes dental. The issue is finding a local provider who accepts [facility] and new patients.Let me done some digging

## 2020-11-28 NOTE — Telephone Encounter (Signed)
Pt called and is out of novolog  Will send short term supply to him through our pharmacy and long term rx to [facility]  Also wants a dental referral to someone who takes [place] medicare advantage.  Monica: do you or can you find a dentist that takes [employer]  I doubt one exists unless he has bought a dental supp plan

## 2020-12-05 ENCOUNTER — Telehealth: Payer: Self-pay

## 2020-12-05 NOTE — Telephone Encounter (Signed)
Error

## 2020-12-05 NOTE — Telephone Encounter (Signed)
Following up on a request from PCP for in network dental resources. Case Manager reached out by phone and verified name and date of birth. Patient agreed he is still looking for a dentist accepting new patients in network. Case Manager shared with patient the information for [facility] to schedule. Case Manager confirmed the provider is in network and accepting patients. Patient appreciative of the information and agreed to call soon.

## 2020-12-11 ENCOUNTER — Other Ambulatory Visit: Payer: Self-pay

## 2020-12-12 ENCOUNTER — Other Ambulatory Visit: Payer: Self-pay

## 2020-12-16 NOTE — Progress Notes (Addendum)
Subjective:    Patient ID: Reginald Rodriguez, male    DOB: [birth year], 70 y.o.   MRN: [id number]  03/28/20 This is a 70 year old male who is seen in the [place] clinic and is a new patient to our area.  He just arrived at the shelter 2 weeks ago.  He moved up from [place] where his primary care provider is Dr. Tammi Klippel and he also has a diabetologist Pecola Leisure [facility].  This patient has had longstanding diabetes hypertension previous stroke.  This patient states his current medications include Tresiba 60 units daily, Humalog 20 units 3 times daily as needed for blood sugars greater than 250, amlodipine 5 mg daily, Lipitor 10 mg daily, hydrocal thiazide 25 mg daily, gabapentin 100 mg twice daily, lactulose 15 mL daily, fenofibrate 54 mg daily.  The patient does not have a glucometer at this time.  He is allergic to penicillin.  He states he has had some headaches and some degree of constipation at this time.  On exam blood sugar is greater than 400 on meter saturation 98% pulse 79 blood pressure is 150/100  Chest is clear abdomen benign benign cardiac exam was unremarkable  Blood sugar greater than 400 on CBG  Impression is that of prior stroke, hypertension, diabetes type 2  Plan will be to [facility] and I will obtain records from his clinic and send a record release which she signed at this visit we will also attempt a get him into our clinic for further follow-up  04/15/2020 Patient is seen today in the clinic for the first time having just moved from [place].  On arrival A1c is 7.7 blood sugar 138.  Longstanding history of hypertension diabetes prior stroke 3 years ago with residual defect with right upper and lower extremity weakness.  On arrival blood pressure is good at 125/86.  Patient uses a cane to ambulate is requesting a Rollator possible.  Patient needs primary care assessments including  hepatitis C assay.  He did have a colonoscopy 3 years ago was negative.  He smokes a pack every 2 days at the shelter.  He has no other new complaints.  06/27/2020 This patient is seen in return follow-up is homeless has type 2 diabetes note his A1c is up to 9.7 he has been missing doses of insulin  Patient also lost his eyeglasses and will need a new set of glasses  He has no other specific complaints at this visit he is still smoking 1 pack every 2 days at the homeless shelter  He is due a Covid booster he states he will pursue  08/27/2020 Pt seen in f/u and doing well so far with diabetes.   The patient has lost some weight his glycemic control is better he brings his meter in blood sugar fasting is 105 postprandial in the 140s.  He now has glasses and able to see better with the glasses.  He complains of some right lower extremity weakness and left back pain.  No other complaints are voiced at this time Patient complains of right lower calf pain  10/15/2020 Patient seen in return follow-up for his diabetes previous history of stroke he complains of left and right thumb pain which improves with motion.  On arrival A1c is 8.5 down from 9.7 last year blood sugar is 193 he normally runs in the 200 range at home and he is on [place] and Trulicity  Patient has no other complaints at this time  5/31 Patient seen in return follow-up for diabetes hypertension previous stroke. Patient's blood sugar is 191 blood pressure 115/82.  Patient still smoking 5 to 6 cigarettes daily.  He did finally get into the dentist getting dental work done.  He needs an updated renal panel and lipid panel at this visit.  He states since his Trulicity dose was increased to 1.5 mg weekly by the pharmacist he is having no energy and some weakness and nausea.  He denies any significant constipation or abdominal pain.  Patient is compliant with his [name] at 60 units daily and NovoLog at 35 units in the morning 30  units at lunch 20 units at dinner  Patient has a prior history of chronic kidney disease stage III and has not been on metformin because of this.  Patient has no other specific complaints at this visit.  We discussed the possibility of a shingles vaccine and he declined this   Patient has had his complete COVID-vaccine series including his booster vaccine  History reviewed. No pertinent past medical history.   History reviewed. No pertinent family history.   Social History   Socioeconomic History  . Marital status: Single    Spouse name: Not on file  . Number of children: Not on file  . Years of education: Not on file  . Highest education level: Not on file  Occupational History  . Not on file  Tobacco Use  . Smoking status: Current Every Day Smoker  . Smokeless tobacco: Former Network engineer  . Vaping Use: Never used  Substance and Sexual Activity  . Alcohol use: Not Currently  . Drug use: Not Currently  . Sexual activity: Not on file  Other Topics Concern  . Not on file  Social History Narrative  . Not on file   Social Determinants of Health   Financial Resource Strain: Not on file  Food Insecurity: Not on file  Transportation Needs: Not on file  Physical Activity: Not on file  Stress: Not on file  Social Connections: Not on file  Intimate Partner Violence: Not on file     Allergies  Allergen Reactions  . Bee Venom Swelling  . Penicillins Rash     Outpatient Medications Prior to Visit  Medication Sig Dispense Refill  . ACCU-CHEK GUIDE test strip USE TO CHECK BLOOD SUGAR 2 TIMES DAILY (Patient taking differently: USE TO CHECK BLOOD SUGAR 2 TIMES DAILY) 100 strip 2  . Accu-Chek Softclix Lancets lancets Use to check blood sugar TWICE DAILY. (Patient taking differently: Use to check blood sugar TWICE DAILY.) 100 each 2  . amLODipine (NORVASC) 5 MG tablet TAKE 1 TABLET (5 MG TOTAL) BY MOUTH DAILY. 90 tablet 1  . aspirin 81 MG EC tablet Take 1 tablet (81 mg  total) by mouth daily. 100 tablet 1  . atorvastatin (LIPITOR) 10 MG tablet TAKE 1 TABLET (10 MG TOTAL) BY MOUTH DAILY. 90 tablet 2  . BD PEN NEEDLE NANO U/F 32G X 4 MM MISC USE 4 TIMES DAILY AS DIRECTED 100 each 1  . Blood Glucose Monitoring Suppl (ACCU-CHEK GUIDE) w/Device KIT USE TO TEST BLOOD SUGAR    . Blood Glucose Monitoring Suppl (ACCU-CHEK GUIDE) w/Device KIT USE TO TEST BLOOD SUGAR 1 kit 0  . chlorthalidone (HYGROTON) 25 MG tablet TAKE 1 TABLET (25 MG TOTAL) BY MOUTH DAILY. 90 tablet 1  . fenofibrate 54 MG tablet TAKE 1 TABLET (54 MG TOTAL) BY  MOUTH DAILY. 90 tablet 1  . gabapentin (NEURONTIN) 100 MG capsule TAKE 1 CAPSULE (100 MG TOTAL) BY MOUTH 2 (TWO) TIMES DAILY. 180 capsule 1  . Dulaglutide (TRULICITY) 1.5 TK/1.6WF SOPN Inject 1.5 mg into the skin once a week. 2 mL 2  . NOVOLOG FLEXPEN 100 UNIT/ML FlexPen Inject 35 units into the skin at breakfast , 35 units at lunch, and 20 units at dinner (Patient taking differently: Inject 35 units into the skin at breakfast , 35 units at lunch, and 20 units at dinner) 15 mL 3  . TRESIBA FLEXTOUCH 100 UNIT/ML FlexTouch Pen INJECT 60 UNITS SUBCUTANEOUSLY AT BEDTIME. 60 mL 1  . amLODipine (NORVASC) 5 MG tablet Take 1 tablet (5 mg total) by mouth daily. 90 tablet 1  . amLODipine (NORVASC) 5 MG tablet TAKE 1 TABLET (5 MG TOTAL) BY MOUTH DAILY. 90 tablet 1  . atorvastatin (LIPITOR) 10 MG tablet Take 1 tablet (10 mg total) by mouth daily. 90 tablet 2  . atorvastatin (LIPITOR) 10 MG tablet TAKE 1 TABLET (10 MG TOTAL) BY MOUTH DAILY. 90 tablet 2  . buPROPion (WELLBUTRIN XL) 150 MG 24 hr tablet TAKE 1 TABLET (150 MG TOTAL) BY MOUTH DAILY. (Patient not taking: Reported on 12/17/2020) 30 tablet 1  . chlorthalidone (HYGROTON) 25 MG tablet Take 1 tablet (25 mg total) by mouth daily. 90 tablet 1  . chlorthalidone (HYGROTON) 25 MG tablet TAKE 1 TABLET (25 MG TOTAL) BY MOUTH DAILY. 90 tablet 1  . fenofibrate 54 MG tablet Take 1 tablet (54 mg total) by mouth daily.  90 tablet 1  . fenofibrate 54 MG tablet TAKE 1 TABLET (54 MG TOTAL) BY MOUTH DAILY. 90 tablet 1  . gabapentin (NEURONTIN) 100 MG capsule Take 1 capsule (100 mg total) by mouth 2 (two) times daily. 180 capsule 1  . gabapentin (NEURONTIN) 100 MG capsule TAKE 1 CAPSULE (100 MG TOTAL) BY MOUTH 2 (TWO) TIMES DAILY. 60 capsule 1  . gabapentin (NEURONTIN) 100 MG capsule TAKE 1 CAPSULE (100 MG TOTAL) BY MOUTH 2 (TWO) TIMES DAILY. 60 capsule 1  . lactulose (CHRONULAC) 10 GM/15ML solution Take 10 g by mouth daily. (Patient not taking: Reported on 12/17/2020)    . LORazepam (ATIVAN) 0.5 MG tablet Take 1 tablet (0.5 mg total) by mouth 3 (three) times daily as needed (Muscle spasm). (Patient not taking: Reported on 12/17/2020) 15 tablet 0  . nicotine (NICODERM CQ - DOSED IN MG/24 HOURS) 21 mg/24hr patch Place 1 patch (21 mg total) onto the skin daily. 28 patch 0  . nicotine (NICODERM CQ - DOSED IN MG/24 HOURS) 21 mg/24hr patch PLACE 1 PATCH (21 MG TOTAL) ONTO THE SKIN DAILY. (Patient not taking: Reported on 12/17/2020) 28 patch 0  . traMADol (ULTRAM) 50 MG tablet Take 1 tablet (50 mg total) by mouth every 6 (six) hours as needed for moderate pain. 15 tablet 0  . traMADol (ULTRAM) 50 MG tablet TAKE 1 TABLET (50 MG TOTAL) BY MOUTH EVERY 6 (SIX) HOURS AS NEEDED FOR MODERATE PAIN. (Patient not taking: Reported on 12/17/2020) 15 tablet 0   No facility-administered medications prior to visit.      Review of Systems  Constitutional: Negative.   HENT: Positive for dental problem.   Eyes: Negative for visual disturbance.  Cardiovascular: Negative for chest pain.  Gastrointestinal: Negative.   Endocrine: Negative.   Genitourinary: Negative.   Musculoskeletal: Negative.   Skin: Negative.   Neurological: Positive for numbness. Negative for facial asymmetry, speech difficulty and weakness.  Right leg weak s/p CVA  Psychiatric/Behavioral: The patient is not nervous/anxious.       Objective:   Physical  Exam Vitals:   12/17/20 0840  BP: 115/82  Pulse: (!) 106  SpO2: 98%  Height: $Remove'5\' 7"'sHPtpcF$  (1.702 m)    Gen: Pleasant, obese, in no distress,  normal affect  ENT: No lesions,  mouth clear,  oropharynx clear, no postnasal drip  Neck: No JVD, no TMG, no carotid bruits  Lungs: No use of accessory muscles, no dullness to percussion, clear without rales or rhonchi  Cardiovascular: RRR, heart sounds normal, no murmur or gallops, no peripheral edema  Abdomen: soft and NT, no HSM,  BS normal  Musculoskeletal: No deformities, no cyanosis or clubbing  Neuro: alert, weak in right upper extremity  Skin: Warm, no lesions or rashes  BMP Latest Ref Rng & Units 12/17/2020 06/27/2020 04/15/2020  Glucose 65 - 99 mg/dL 154(H) 107(H) 105(H)  BUN 8 - 27 mg/dL $Remove'18 18 20  'TpmpYeu$ Creatinine 0.76 - 1.27 mg/dL 1.66(H) 1.31(H) 1.46(H)  BUN/Creat Ratio 10 - $Re'24 11 14 14  'rAH$ Sodium 134 - 144 mmol/L 140 140 140  Potassium 3.5 - 5.2 mmol/L 3.8 3.8 3.9  Chloride 96 - 106 mmol/L 97 97 101  CO2 20 - 29 mmol/L $RemoveB'25 27 28  'zSFZOFAa$ Calcium 8.6 - 10.2 mg/dL 10.4(H) 10.1 10.1   Lab Results  Component Value Date   HGBA1C 8.5 (A) 10/15/2020       Assessment & Plan:  I personally reviewed all images and lab data in the [facility] system as well as any outside material available during this office visit and agree with the  radiology impressions.   Essential hypertension Currently at goal we will continue amlodipine at current dose  Hyperlipidemia associated with type 2 diabetes mellitus ([place]) Hyperlipidemia previously at goal recheck lipid panel continue 10 mg daily of atorvastatin  Uncontrolled type 2 diabetes mellitus with hyperglycemia ([place]) Patient shows a pattern where his fasting sugars are in the 1 40-1 60 range in the morning and late afternoon numbers are in the 250 298 range  Discussed case with clinical pharmacy and patient's not tolerating the Trulicity is only been using the 0.75 dose which she receives from [place] is to decrease his documented dose of Trulicity to 6.29 weekly  Will increase Tresiba to 65 units nightly and NovoLog increased to 38 units before breakfast and lunch and 20 units at bedtime to continue  Will check renal function if his renal function is adequate we may start him on low-dose oral metformin at 500 mg daily  Check renal panel at this visit     Tobacco use    . Current smoking consumption amount: Several cigarettes a day  . Dicsussion on advise to quit smoking and smoking impacts: Cardiovascular impact  . Patient's willingness to quit: Willing to quit  . Methods to quit smoking discussed: Nicotine replacement  . Medication management of smoking session drugs discussed: Nicotine replacement  . Resources provided:  AVS   . Setting quit date not established  . 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  Ambulating without walker.  Hemiparesis markedly better  CKD (chronic kidney disease) stage 3, GFR 30-59 ml/min CKD still stage 3  GFR 45  BMI 37.0-37.9, adult Discussed healthy diet and exercise program   Mykle was seen today for diabetes.  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 -     lactulose, encephalopathy, (CHRONULAC) 10 GM/15ML SOLN; Take 15 mLs (10 g total) by mouth daily. -     Dulaglutide (TRULICITY) 7.70 HE/0.3TC SOPN; Inject 0.75 mg into the skin once a week. -     NOVOLOG FLEXPEN 100 UNIT/ML FlexPen; Inject 38 units into the skin at breakfast , 38 units at lunch, and 20 units at dinner -     TRESIBA FLEXTOUCH 100 UNIT/ML FlexTouch Pen; Inject 65 Units into the skin at bedtime. -     Insulin Pen Needle (BD PEN NEEDLE NANO 2ND GEN) 32G X 4 MM MISC; use as directed 4 times daily  I spent 26 minutes on this patient discussing treatment plan    LABS are back eGFR 45  Plan ref to endocrinology  Not a candidate for either Jardiance or metformin.

## 2020-12-17 ENCOUNTER — Other Ambulatory Visit: Payer: Self-pay

## 2020-12-17 ENCOUNTER — Encounter: Payer: Self-pay | Admitting: Critical Care Medicine

## 2020-12-17 ENCOUNTER — Ambulatory Visit: Payer: Medicare HMO | Attending: Critical Care Medicine | Admitting: Critical Care Medicine

## 2020-12-17 VITALS — BP 115/82 | HR 106 | Ht 67.0 in

## 2020-12-17 DIAGNOSIS — I10 Essential (primary) hypertension: Secondary | ICD-10-CM

## 2020-12-17 DIAGNOSIS — F17210 Nicotine dependence, cigarettes, uncomplicated: Secondary | ICD-10-CM

## 2020-12-17 DIAGNOSIS — E1165 Type 2 diabetes mellitus with hyperglycemia: Secondary | ICD-10-CM | POA: Diagnosis not present

## 2020-12-17 DIAGNOSIS — Z6837 Body mass index (BMI) 37.0-37.9, adult: Secondary | ICD-10-CM | POA: Diagnosis not present

## 2020-12-17 DIAGNOSIS — I6930 Unspecified sequelae of cerebral infarction: Secondary | ICD-10-CM

## 2020-12-17 DIAGNOSIS — N1831 Chronic kidney disease, stage 3a: Secondary | ICD-10-CM

## 2020-12-17 DIAGNOSIS — Z720 Tobacco use: Secondary | ICD-10-CM

## 2020-12-17 DIAGNOSIS — E1169 Type 2 diabetes mellitus with other specified complication: Secondary | ICD-10-CM | POA: Diagnosis not present

## 2020-12-17 DIAGNOSIS — E785 Hyperlipidemia, unspecified: Secondary | ICD-10-CM

## 2020-12-17 LAB — GLUCOSE, POCT (MANUAL RESULT ENTRY): POC Glucose: 191 mg/dl — AB (ref 70–99)

## 2020-12-17 MED ORDER — TRULICITY 0.75 MG/0.5ML ~~LOC~~ SOAJ: 0.7500 mg | SUBCUTANEOUS | 4 refills | Status: DC

## 2020-12-17 MED ORDER — BD PEN NEEDLE NANO 2ND GEN 32G X 4 MM MISC
0 refills | Status: DC
  Filled 2020-12-17: qty 100, 25d supply, fill #0

## 2020-12-17 MED ORDER — TRESIBA FLEXTOUCH 100 UNIT/ML ~~LOC~~ SOPN: 65.0000 [IU] | PEN_INJECTOR | Freq: Every day | SUBCUTANEOUS | 1 refills | Status: DC

## 2020-12-17 MED ORDER — LACTULOSE ENCEPHALOPATHY 10 GM/15ML PO SOLN
10.0000 g | Freq: Every day | ORAL | 1 refills | Status: DC
  Filled 2020-12-17: qty 236, 15d supply, fill #0

## 2020-12-17 MED ORDER — GABAPENTIN 100 MG PO CAPS: ORAL_CAPSULE | Freq: Two times a day (BID) | ORAL | 1 refills | Status: DC

## 2020-12-17 MED ORDER — NOVOLOG FLEXPEN 100 UNIT/ML ~~LOC~~ SOPN
PEN_INJECTOR | SUBCUTANEOUS | 3 refills | Status: DC
Start: 2020-12-17 — End: 2021-01-07

## 2020-12-17 MED FILL — Gabapentin Cap 100 MG: ORAL | 90 days supply | Qty: 180 | Fill #0 | Status: AC

## 2020-12-17 NOTE — Patient Instructions (Addendum)
Increase your [name] and Humalog per directions of our clinical pharmacist today:  Tresiba 65 daily and  humalog to 38 units at breakfast and lunch and 20 units dinner  Gabapentin refill sent to our pharmacy in future refill sent to [facility]  Work on smoking cessation as we discussed, consider purchasing the nicotine patches as your insurance will not cover that  Reduce Trulicity to 3.12 mg weekly using the devices you currently have from [facility]  Return to see Dr. Joya Gaskins again in 3 months  Follow-up labs today include metabolic profile blood count lipid panel  Return to see Lurena Joiner our clinical pharmacist in 2 weeks

## 2020-12-17 NOTE — Assessment & Plan Note (Signed)
  .   Current smoking consumption amount: Several cigarettes a day  . Dicsussion on advise to quit smoking and smoking impacts: Cardiovascular impact  . Patient's willingness to quit: Willing to quit  . Methods to quit smoking discussed: Nicotine replacement  . Medication management of smoking session drugs discussed: Nicotine replacement  . Resources provided:  AVS   . Setting quit date not established  . Follow-up arranged 2 months   Time spent counseling the patient: 5 minutes

## 2020-12-17 NOTE — Assessment & Plan Note (Signed)
Hyperlipidemia previously at goal recheck lipid panel continue 10 mg daily of atorvastatin

## 2020-12-17 NOTE — Assessment & Plan Note (Signed)
Patient shows a pattern where his fasting sugars are in the 1 40-1 60 range in the morning and late afternoon numbers are in the 250 298 range  Discussed case with clinical pharmacy and patient's not tolerating the Trulicity is only been using the 0.75 dose which she receives from [place] is to decrease his documented dose of Trulicity to 7.15 weekly  Will increase Tresiba to 65 units nightly and NovoLog increased to 38 units before breakfast and lunch and 20 units at bedtime to continue  Will check renal function if his renal function is adequate we may start him on low-dose oral metformin at 500 mg daily  Check renal panel at this visit

## 2020-12-17 NOTE — Assessment & Plan Note (Signed)
Currently at goal we will continue amlodipine at current dose

## 2020-12-18 ENCOUNTER — Other Ambulatory Visit: Payer: Self-pay

## 2020-12-18 DIAGNOSIS — E66813 Obesity, class 3: Secondary | ICD-10-CM | POA: Insufficient documentation

## 2020-12-18 DIAGNOSIS — Z6837 Body mass index (BMI) 37.0-37.9, adult: Secondary | ICD-10-CM | POA: Insufficient documentation

## 2020-12-18 LAB — LIPID PANEL
Chol/HDL Ratio: 3.4 ratio (ref 0.0–5.0)
Cholesterol, Total: 112 mg/dL (ref 100–199)
HDL: 33 mg/dL — ABNORMAL LOW (ref >39)
LDL Chol Calc (NIH): 50 mg/dL (ref 0–99)
Triglycerides: 175 mg/dL — ABNORMAL HIGH (ref 0–149)
VLDL Cholesterol Cal: 29 mg/dL (ref 5–40)

## 2020-12-18 LAB — COMPREHENSIVE METABOLIC PANEL
ALT: 46 IU/L — ABNORMAL HIGH (ref 0–44)
AST: 38 IU/L (ref 0–40)
Albumin/Globulin Ratio: 1.9 (ref 1.2–2.2)
Albumin: 4.6 g/dL (ref 3.8–4.8)
Alkaline Phosphatase: 72 IU/L (ref 44–121)
BUN/Creatinine Ratio: 11 (ref 10–24)
BUN: 18 mg/dL (ref 8–27)
Bilirubin Total: 0.3 mg/dL (ref 0.0–1.2)
CO2: 25 mmol/L (ref 20–29)
Calcium: 10.4 mg/dL — ABNORMAL HIGH (ref 8.6–10.2)
Chloride: 97 mmol/L (ref 96–106)
Creatinine, Ser: 1.66 mg/dL — ABNORMAL HIGH (ref 0.76–1.27)
Globulin, Total: 2.4 g/dL (ref 1.5–4.5)
Glucose: 154 mg/dL — ABNORMAL HIGH (ref 65–99)
Potassium: 3.8 mmol/L (ref 3.5–5.2)
Sodium: 140 mmol/L (ref 134–144)
Total Protein: 7 g/dL (ref 6.0–8.5)
eGFR: 45 mL/min/{1.73_m2} — ABNORMAL LOW (ref >59)

## 2020-12-18 LAB — CBC WITH DIFFERENTIAL/PLATELET
Basophils Absolute: 0.1 10*3/uL (ref 0.0–0.2)
Basos: 1 %
EOS (ABSOLUTE): 0.3 10*3/uL (ref 0.0–0.4)
Eos: 3 %
Hematocrit: 47.9 % (ref 37.5–51.0)
Hemoglobin: 15.8 g/dL (ref 13.0–17.7)
Immature Grans (Abs): 0 10*3/uL (ref 0.0–0.1)
Immature Granulocytes: 0 %
Lymphocytes Absolute: 3.4 10*3/uL — ABNORMAL HIGH (ref 0.7–3.1)
Lymphs: 39 %
MCH: 30.1 pg (ref 26.6–33.0)
MCHC: 33 g/dL (ref 31.5–35.7)
MCV: 91 fL (ref 79–97)
Monocytes Absolute: 0.7 10*3/uL (ref 0.1–0.9)
Monocytes: 8 %
Neutrophils Absolute: 4.1 10*3/uL (ref 1.4–7.0)
Neutrophils: 49 %
Platelets: 295 10*3/uL (ref 150–450)
RBC: 5.25 x10E6/uL (ref 4.14–5.80)
RDW: 13.7 % (ref 11.6–15.4)
WBC: 8.6 10*3/uL (ref 3.4–10.8)

## 2020-12-18 NOTE — Assessment & Plan Note (Signed)
Pt has improved function.  Ambulating without walker.  Hemiparesis markedly better

## 2020-12-18 NOTE — Assessment & Plan Note (Signed)
CKD still stage 3  GFR 45

## 2020-12-18 NOTE — Assessment & Plan Note (Signed)
Discussed healthy diet and exercise program

## 2020-12-19 MED ORDER — BD PEN NEEDLE NANO 2ND GEN 32G X 4 MM MISC: 0 refills | Status: DC

## 2020-12-19 NOTE — Addendum Note (Signed)
Addended by: Asencion Noble E on: 12/19/2020 09:10 AM   Modules accepted: Orders

## 2020-12-20 ENCOUNTER — Other Ambulatory Visit: Payer: Self-pay

## 2020-12-23 ENCOUNTER — Ambulatory Visit: Payer: Medicare HMO | Admitting: Pharmacist

## 2021-01-07 ENCOUNTER — Other Ambulatory Visit: Payer: Self-pay | Admitting: Critical Care Medicine

## 2021-01-07 ENCOUNTER — Other Ambulatory Visit: Payer: Self-pay

## 2021-01-07 MED ORDER — NOVOLOG FLEXPEN 100 UNIT/ML ~~LOC~~ SOPN
PEN_INJECTOR | SUBCUTANEOUS | 3 refills | Status: DC
  Filled 2021-01-07: qty 30, 30d supply, fill #0

## 2021-01-07 NOTE — Telephone Encounter (Signed)
   Notes to clinic: Review for refill Pharmacy states that script was d/c    Requested Prescriptions  Pending Prescriptions Disp Refills   NOVOLOG FLEXPEN 100 UNIT/ML FlexPen 15 mL 3    Sig: Inject 38 units into the skin at breakfast , 38 units at lunch, and 20 units at dinner      Endocrinology:  Diabetes - Insulins Failed - 01/07/2021  1:57 PM      Failed - HBA1C is between 0 and 7.9 and within 180 days    HbA1c, POC (controlled diabetic range)  Date Value Ref Range Status  10/15/2020 8.5 (A) 0.0 - 7.0 % Final          Passed - Valid encounter within last 6 months    Recent Outpatient Visits           3 weeks ago Uncontrolled type 2 diabetes mellitus with hyperglycemia [facility])   [place] Elsie Stain, MD   1 month ago Uncontrolled type 2 diabetes mellitus with hyperglycemia [facility])   [place], Annie Main L, RPH-CPP   2 months ago Uncontrolled type 2 diabetes mellitus with hyperglycemia [facility])   [place] Elsie Stain, MD   4 months ago Uncontrolled type 2 diabetes mellitus with hyperglycemia [facility])   [place] Elsie Stain, MD   6 months ago Uncontrolled type 2 diabetes mellitus with hyperglycemia [facility])   [place], [place], MD       Future Appointments             Tomorrow Daisy Blossom, Jarome Matin, [place]

## 2021-01-07 NOTE — Telephone Encounter (Signed)
Medication Refill - Medication: NOVOLOG FLEXPEN 100 UNIT/ML FlexPen    Preferred Pharmacy (with phone number or street name):  [facility] and [place] Phone:  [phone]  Fax:  [phone]      Agent: Please be advised that RX refills may take up to 3 business days. We ask that you follow-up with your pharmacy.

## 2021-01-07 NOTE — Progress Notes (Signed)
S:     PCP: Dr. Joya Gaskins PMH: HTN, DM, CVA, CKD-3  Patient arrives in good spirits. Presents for diabetes evaluation, education, and management. Patient was referred and last seen by Primary Care Provider on 12/17/20. At that visit, POCT BG 181. Home fasting sugars ranged 140-160 and afternoon sugars ranged 250-298. Reported lack of energy, weakness, and nausea since increasing Trulicity to 1.5 mg weekly. Trulicity was decreased back to 0.75 mg weekly, Tresiba increased from 60 units to 65 units, and Novolog increased to 38 units before breakfast and lunch, and 20 units continued at bedtime.   Today, patient reports taking Tresiba 60 units at bedtime instead of 65 units as prescribed. Reports he was unaware Novolog was increased from 35 units to 38 units with breakfast and lunch. Reports taking Novolog 20 units with dinner. Reports adherence with Trulicity 9.21 mg weekly on Mondays. Denies GI issues since reducing Trulicity from 1.5 mg to 0.75 mg. Glucometer readings show fasting BG ranging 180-250s and post prandial BG 180-250s up to 275 and 307. Denies BG<70.  Family/Social History:  -Fhx: tobacco smoker 5-6 cigs/day  Insurance coverage/medication affordability: [employer]  Medication adherence reported fair.   Current diabetes medications include: Trulicity 1.94 mg daily, Tresiba 65 units daily (taking 60 units), Novolog 38 units with breakfast and lunch (taking 35 units) and 20 units qPM  Current hypertension medications include: amlodipine 5 mg daily, HCTZ 25 mg daily Current hyperlipidemia medications include: atorvastatin 10 mg daily, fenofibrate 54 mg daily  Patient denies hypoglycemic events.  Patient-reported exercise habits: limited by walker   Patient reports nocturia (nighttime urination).  Patient denies neuropathy (nerve pain). Patient denies visual changes. Patient reports self foot exams.    O:  POCT: 136 (fasting)  6/16 - 6/22 Home fasting blood sugars: 185, 154, 204,  246, 256, 249, 196 2 hour post-meal/random blood sugars: 181, 107, 193, 216, 307, 275, 253  Lab Results  Component Value Date   HGBA1C 8.5 (A) 10/15/2020   There were no vitals filed for this visit.  Lipid Panel     Component Value Date/Time   CHOL 112 12/17/2020 0915   TRIG 175 (H) 12/17/2020 0915   HDL 33 (L) 12/17/2020 0915   CHOLHDL 3.4 12/17/2020 0915   LDLCALC 50 12/17/2020 0915   Clinical Atherosclerotic Cardiovascular Disease (ASCVD): Yes  (hx of stroke) The ASCVD Risk score Mikey Bussing DC Jr., et al., 2013) failed to calculate for the following reasons:   The valid total cholesterol range is 130 to 320 mg/dL   A/P: Diabetes longstanding currently uncontrolled as A1c today increased from 8.5% to 9.2%. Control is suboptimal due to patient's confusion with insulin regimen. Reviewed all medication changes. Patient verbalized how many units of insulin he should be taking. Patient is able to verbalize appropriate hypoglycemia management plan.  -Start Tresiba 65 units daily as prescribed -Start Novolog 38 units with breakfast and lunch as prescribed. Continued Novolog 20 units with dinner -Continued Trulicity 1.74 mg weekly -Extensively discussed pathophysiology of diabetes, recommended lifestyle interventions, dietary effects on blood sugar control -Counseled on s/sx of and management of hypoglycemia -Next A1C anticipated 03/2021.   ASCVD risk - secondary prevention in patient with diabetes. Last LDL is controlled. High intensity statin indication -Continued atorvastatin 10 mg daily, however will benefit from high intensity statin due to history of stroke. Will address at next visit  Written patient instructions provided.  Total time in face to face counseling 30 minutes.   Follow up Pharmacist Clinic  Visit in 4 weeks.   Lorel Monaco, PharmD, [place] PGY2 Ambulatory Care Resident [place]

## 2021-01-08 ENCOUNTER — Other Ambulatory Visit: Payer: Self-pay

## 2021-01-08 ENCOUNTER — Encounter: Payer: Self-pay | Admitting: Pharmacist

## 2021-01-08 ENCOUNTER — Ambulatory Visit: Payer: Medicare HMO | Attending: Critical Care Medicine | Admitting: Pharmacist

## 2021-01-08 DIAGNOSIS — E1165 Type 2 diabetes mellitus with hyperglycemia: Secondary | ICD-10-CM

## 2021-01-08 LAB — POCT GLYCOSYLATED HEMOGLOBIN (HGB A1C): Hemoglobin A1C: 9.2 % — AB (ref 4.0–5.6)

## 2021-01-08 LAB — GLUCOSE, POCT (MANUAL RESULT ENTRY): POC Glucose: 136 mg/dl — AB (ref 70–99)

## 2021-01-08 NOTE — Patient Instructions (Addendum)
It was nice to see you today!  Your goal blood sugar is 80-130 before eating and less than 180 after eating.  Medication Changes: Begin taking Tresiba 65 units daily at night  Begin taking Novolog 38 units with breakfast and lunch, and 20 units with dinner  Continue Trulicity 0.51 mg weekly  Monitor blood sugars at home and keep a log (glucometer or piece of paper) to bring with you to your next visit.  Keep up the good work with diet and exercise. Aim for a diet full of vegetables, fruit and lean meats (chicken, [place], fish). Try to limit salt intake by eating fresh or frozen vegetables (instead of canned), rinse canned vegetables prior to cooking and do not add any additional salt to meals.

## 2021-01-15 ENCOUNTER — Other Ambulatory Visit: Payer: Self-pay | Admitting: Critical Care Medicine

## 2021-01-22 ENCOUNTER — Ambulatory Visit: Payer: Medicare HMO | Attending: Critical Care Medicine

## 2021-01-22 ENCOUNTER — Other Ambulatory Visit: Payer: Self-pay

## 2021-01-22 ENCOUNTER — Other Ambulatory Visit: Payer: Self-pay | Admitting: Critical Care Medicine

## 2021-01-22 DIAGNOSIS — Z23 Encounter for immunization: Secondary | ICD-10-CM

## 2021-01-22 MED ORDER — BD PEN NEEDLE NANO U/F 32G X 4 MM MISC
1 refills | Status: DC
  Filled 2021-01-22: qty 100, 25d supply, fill #0

## 2021-01-22 NOTE — Telephone Encounter (Signed)
Copied from [place] [phone] [id number]. Topic: Quick Communication - Rx Refill/Question >> Jan 22, 2021  8:40 AM Tessa Lerner A wrote: Medication: Insulin Pen Needle (BD PEN NEEDLE NANO 2ND GEN) 32G X 4 MM MISC   Has the patient contacted their pharmacy? No. (Agent: If no, request that the patient contact the pharmacy for the refill.) (Agent: If yes, when and what did the pharmacy advise?)  Preferred Pharmacy (with phone number or street name): [facility] and [place]  Phone:  [phone] Fax:  [phone]     Agent: Please be advised that RX refills may take up to 3 business days. We ask that you follow-up with your pharmacy.

## 2021-01-23 ENCOUNTER — Other Ambulatory Visit: Payer: Self-pay

## 2021-01-23 NOTE — Progress Notes (Signed)
   Covid-19 Vaccination Clinic  Name:  Audley Hinojos    MRN: [id number] DOB: [birth year]  01/23/2021  Mr. Vaughan was observed post Covid-19 immunization for 15 minutes without incident. He was provided with Vaccine Information Sheet and instruction to access the V-Safe system.   Mr. Daffin was instructed to call 911 with any severe reactions post vaccine: Difficulty breathing  Swelling of face and throat  A fast heartbeat  A bad rash all over body  Dizziness and weakness   Immunizations Administered     Name Date Dose VIS Date Route   PFIZER Comrnaty(Gray TOP) Covid-19 Vaccine 01/22/2021  2:18 PM 0.3 mL 06/27/2020 Intramuscular   Manufacturer: [place]   Lot: [id number]   [place]: [phone]

## 2021-01-30 ENCOUNTER — Other Ambulatory Visit: Payer: Self-pay

## 2021-01-31 ENCOUNTER — Other Ambulatory Visit: Payer: Self-pay

## 2021-02-03 NOTE — Progress Notes (Signed)
S:     PCP: Dr. Joya Gaskins PMH: HTN, DM, CVA, CKD-3  Patient arrives in good spirits. Presents for diabetes evaluation, education, and management. Patient was referred and last seen by Primary Care Provider on 12/17/20. At that visit, POCT BG 181. Home fasting sugars ranged 140-160 and afternoon sugars ranged 250-298. Reported lack of energy, weakness, and nausea since increasing Trulicity to 1.5 mg weekly. Trulicity was decreased back to 0.75 mg weekly, Tresiba increased from 60 units to 65 units, and Novolog increased to 38 units before breakfast and lunch, and 20 units continued at bedtime.   At last pharmacy clinic visit on 01/08/21, A1c had increased from 8.5 to 9.2. Patient reported taking Tresiba 60 units at bedtime instead of 65 units as prescribed. He was also still taking Novolog 35 units with breakfast and lunch instead of 38 but was taking Novolog 20 units with dinner as prescribed. Denied GI issues since reducing Trulicity from 1.5 mg to 0.75 mg. Glucometer readings showed fasting BG ranging 180-250s and post prandial BG 180-250s up to 275 and 307. [name] was increased to 65 units daily and Novolog to 38 units with breakfast and lunch.   Today, patient arrives in good spirits and reports he is taking [place] 66 units daily and Novolog 36 units with breakfast and lunch and 20 units with dinner. Fasting BG from his glucometer range from 137 to 270 and post-prandial BG (which he is taking after only 30-45 minutes after eating) range from 194-305. He notes that he prefers to receive his medications from [facility] mail order since they ship it to him and give him 90 day supplies. Otherwise has no concerns regarding his medications today.   Family/Social History:  -Tobacco smoker 5-6 cigs/day  Insurance coverage/medication affordability: [employer] Medicare  Medication adherence reported fair.    Current diabetes medications include:  -Trulicity 1.51 mg weekly (Mondays) -Tresiba 65 units  daily -Novolog 38 units with breakfast and lunch and 20 units qPM   Current hypertension medications include:  -amlodipine 5 mg daily -HCTZ 25 mg daily  Current hyperlipidemia medications include:  -atorvastatin 10 mg daily -fenofibrate 54 mg daily  Patient denies hypoglycemic events.  Patient-reported exercise habits: limited by walker   Patient dnies nocturia (nighttime urination).  Patient denies neuropathy (nerve pain). Patient denies visual changes. Patient reports self foot exams.    O:  POCT: 116 (~3 hours post-prandial)  Fasting: 176, 157, 270, 238, 281, 165, 137, 158 Post-prandial (30-40 mins): 196, 162, 238, 198, 203, 230, 305, 194 Average BG over 7 days 204, 14 days 210  Lab Results  Component Value Date   HGBA1C 9.2 (A) 01/08/2021   There were no vitals filed for this visit.  Lipid Panel     Component Value Date/Time   CHOL 112 12/17/2020 0915   TRIG 175 (H) 12/17/2020 0915   HDL 33 (L) 12/17/2020 0915   CHOLHDL 3.4 12/17/2020 0915   LDLCALC 50 12/17/2020 0915   Clinical Atherosclerotic Cardiovascular Disease (ASCVD): Yes  (hx of stroke) The ASCVD Risk score Mikey Bussing DC Jr., et al., 2013) failed to calculate for the following reasons:   The valid total cholesterol range is 130 to 320 mg/dL   A/P:  Diabetes longstanding currently uncontrolled given home BG not at goal. Control is suboptimal due to patient's confusion with insulin regimen as he still has not increased his Novolog dose. Reviewed all medication changes in detail today. Patient verbalized how many units of insulin he should be taking.  Also re-educated patient that he should check his post-prandial (afternoon) blood glucose 2 hours after eating, rather than 30-45 minutes to give more accurate readings. These values are higher than they would be if he waited a little later. 3 hour post-prandial BG today in clinic is at goal. However, given that patient's morning fasting BG are elevated, will increase  his evening Novolog to help better cover his dinner to improve his fasting BG the next morning. Patient is able to verbalize appropriate hypoglycemia management plan. He is also not currently on a low dose ACE inhibitor to provide renal benefit but this is indicated given his diabetes and concurrent CKD.  -Start lisinopril 2.5 mg daily. Will check BMET in 4 weeks at next visit.  -Continue Tresiba 66 units daily  -Start Novolog 38 units with breakfast and lunch as prescribed. Increase Novolog to 24 units with dinner.  -Continued Trulicity 8.27 mg weekly -Extensively discussed pathophysiology of diabetes, recommended lifestyle interventions, dietary effects on blood sugar control -Counseled on s/sx of and management of hypoglycemia -Next A1C anticipated 03/2021.   ASCVD risk - secondary prevention in patient with diabetes. Last LDL is controlled. High intensity statin indication -Continued atorvastatin 10 mg daily, given that his most recent LDL of 50 is at goal <55 mg/dL.   Have switched over his prescriptions to fill from [facility] mail order pharmacy per patient's request.   Written patient instructions provided.  Total time in face to face counseling 20 minutes.   Follow up Pharmacist Clinic Visit in 4 weeks. Will check BMET at that time. Likely can follow up with endocrinology after that visit as he is scheduled to see them in August.   Rebbeca Paul, PharmD PGY2 Ambulatory Care Pharmacy Resident 02/05/2021 12:40 PM

## 2021-02-05 ENCOUNTER — Ambulatory Visit: Payer: Medicare HMO | Attending: Critical Care Medicine | Admitting: Pharmacist

## 2021-02-05 ENCOUNTER — Other Ambulatory Visit: Payer: Self-pay

## 2021-02-05 DIAGNOSIS — E1165 Type 2 diabetes mellitus with hyperglycemia: Secondary | ICD-10-CM | POA: Diagnosis not present

## 2021-02-05 LAB — GLUCOSE, POCT (MANUAL RESULT ENTRY): POC Glucose: 116 mg/dl — AB (ref 70–99)

## 2021-02-05 MED ORDER — LISINOPRIL 2.5 MG PO TABS: 2.5000 mg | ORAL_TABLET | Freq: Every day | ORAL | 0 refills | Status: DC

## 2021-02-05 MED ORDER — ATORVASTATIN CALCIUM 10 MG PO TABS: ORAL_TABLET | Freq: Every day | ORAL | 2 refills | Status: DC

## 2021-02-05 MED ORDER — AMLODIPINE BESYLATE 5 MG PO TABS: ORAL_TABLET | Freq: Every day | ORAL | 1 refills | Status: DC

## 2021-02-05 MED ORDER — CHLORTHALIDONE 25 MG PO TABS
ORAL_TABLET | Freq: Every day | ORAL | 1 refills | Status: DC
Start: 2021-02-05 — End: 2021-06-16

## 2021-02-05 MED ORDER — FENOFIBRATE 54 MG PO TABS: ORAL_TABLET | ORAL | 1 refills | Status: DC

## 2021-02-05 MED ORDER — NOVOLOG FLEXPEN 100 UNIT/ML ~~LOC~~ SOPN: PEN_INJECTOR | SUBCUTANEOUS | 3 refills | Status: DC

## 2021-02-05 MED ORDER — BD PEN NEEDLE NANO U/F 32G X 4 MM MISC: 1 refills | Status: DC

## 2021-02-05 NOTE — Patient Instructions (Addendum)
It was nice to see you today!  Your goal blood sugar is 80-130 before eating and less than 180 after eating.  Medication Changes: Increase your Novolog to 38/38/24 Continue Tresiba 66 units daily  START lisinopril 2.5 mg daily  Try to start checking your afternoon blood sugar about 2 hours after eating  See [place] back in 4 weeks   Monitor blood sugars at home and keep a log (glucometer or piece of paper) to bring with you to your next visit.  Keep up the good work with diet and exercise. Aim for a diet full of vegetables, fruit and lean meats (chicken, [place], fish). Try to limit salt intake by eating fresh or frozen vegetables (instead of canned), rinse canned vegetables prior to cooking and do not add any additional salt to meals.

## 2021-02-11 ENCOUNTER — Ambulatory Visit: Payer: Medicare HMO

## 2021-02-26 ENCOUNTER — Other Ambulatory Visit: Payer: Self-pay

## 2021-02-27 ENCOUNTER — Emergency Department (HOSPITAL_COMMUNITY)
Admission: EM | Admit: 2021-02-27 | Discharge: 2021-02-27 | Disposition: A | Discharge status: Home / Self Care | Payer: Medicare HMO | Attending: Emergency Medicine | Admitting: Emergency Medicine

## 2021-02-27 ENCOUNTER — Other Ambulatory Visit: Payer: Self-pay

## 2021-02-27 DIAGNOSIS — R112 Nausea with vomiting, unspecified: Secondary | ICD-10-CM | POA: Insufficient documentation

## 2021-02-27 DIAGNOSIS — Z5321 Procedure and treatment not carried out due to patient leaving prior to being seen by health care provider: Secondary | ICD-10-CM | POA: Insufficient documentation

## 2021-02-27 DIAGNOSIS — I10 Essential (primary) hypertension: Secondary | ICD-10-CM | POA: Diagnosis not present

## 2021-02-27 DIAGNOSIS — E119 Type 2 diabetes mellitus without complications: Secondary | ICD-10-CM | POA: Diagnosis not present

## 2021-02-27 DIAGNOSIS — R109 Unspecified abdominal pain: Secondary | ICD-10-CM | POA: Insufficient documentation

## 2021-02-27 DIAGNOSIS — R0789 Other chest pain: Secondary | ICD-10-CM | POA: Diagnosis not present

## 2021-02-27 DIAGNOSIS — R1013 Epigastric pain: Secondary | ICD-10-CM | POA: Diagnosis not present

## 2021-02-27 DIAGNOSIS — R079 Chest pain, unspecified: Secondary | ICD-10-CM | POA: Diagnosis not present

## 2021-02-27 DIAGNOSIS — R1084 Generalized abdominal pain: Secondary | ICD-10-CM | POA: Diagnosis not present

## 2021-02-27 DIAGNOSIS — R739 Hyperglycemia, unspecified: Secondary | ICD-10-CM | POA: Diagnosis not present

## 2021-02-27 LAB — URINALYSIS, ROUTINE W REFLEX MICROSCOPIC
Bacteria, UA: NONE SEEN
Bilirubin Urine: NEGATIVE
Glucose, UA: 150 mg/dL — AB
Hgb urine dipstick: NEGATIVE
Ketones, ur: NEGATIVE mg/dL
Nitrite: NEGATIVE
Protein, ur: NEGATIVE mg/dL
Specific Gravity, Urine: 1.023 (ref 1.005–1.030)
pH: 5 (ref 5.0–8.0)

## 2021-02-27 LAB — COMPREHENSIVE METABOLIC PANEL
ALT: 42 U/L (ref 0–44)
AST: 40 U/L (ref 15–41)
Albumin: 4.1 g/dL (ref 3.5–5.0)
Alkaline Phosphatase: 58 U/L (ref 38–126)
Anion gap: 8 (ref 5–15)
BUN: 23 mg/dL (ref 8–23)
CO2: 26 mmol/L (ref 22–32)
Calcium: 9.4 mg/dL (ref 8.9–10.3)
Chloride: 105 mmol/L (ref 98–111)
Creatinine, Ser: 1.74 mg/dL — ABNORMAL HIGH (ref 0.61–1.24)
GFR, Estimated: 42 mL/min — ABNORMAL LOW (ref >60)
Glucose, Bld: 224 mg/dL — ABNORMAL HIGH (ref 70–99)
Potassium: 3.6 mmol/L (ref 3.5–5.1)
Sodium: 139 mmol/L (ref 135–145)
Total Bilirubin: 0.6 mg/dL (ref 0.3–1.2)
Total Protein: 7.4 g/dL (ref 6.5–8.1)

## 2021-02-27 LAB — CBC
HCT: 44.2 % (ref 39.0–52.0)
Hemoglobin: 14.6 g/dL (ref 13.0–17.0)
MCH: 30.2 pg (ref 26.0–34.0)
MCHC: 33 g/dL (ref 30.0–36.0)
MCV: 91.3 fL (ref 80.0–100.0)
Platelets: 269 10*3/uL (ref 150–400)
RBC: 4.84 MIL/uL (ref 4.22–5.81)
RDW: 14.6 % (ref 11.5–15.5)
WBC: 8.3 10*3/uL (ref 4.0–10.5)
nRBC: 0 % (ref 0.0–0.2)

## 2021-02-27 LAB — LIPASE, BLOOD: Lipase: 44 U/L (ref 11–51)

## 2021-02-27 NOTE — ED Provider Notes (Signed)
Emergency Medicine Provider Triage Evaluation Note  Maurice Morgenroth , a 70 y.o. male  was evaluated in triage.  Pt complains of abd pain that started 2 days ago.  Review of Systems  Positive: Abd pain Negative: Nv, diarrhea, constipation, fevers  Physical Exam  BP (!) 116/95 (BP Location: Left Arm)   Pulse 81   Temp 98.3 F (36.8 C) (Oral)   Resp 17   SpO2 97%  Gen:   Awake, no distress   Resp:  Normal effort  MSK:   Moves extremities without difficulty  Other:  Abd is soft  Medical Decision Making  Medically screening exam initiated at 3:16 PM.  Appropriate orders placed.  [name] was informed that the remainder of the evaluation will be completed by another provider, this initial triage assessment does not replace that evaluation, and the importance of remaining in the ED until their evaluation is complete.     Bishop Dublin 02/27/21 1518    Teressa Lower, MD 02/27/21 1806

## 2021-02-27 NOTE — ED Notes (Signed)
Patient reports to staff that he is leaving

## 2021-02-27 NOTE — ED Triage Notes (Signed)
[name] c/o abdominal pain  x2 days. Reports taking a laxative today w/ no relief. Patient has multiple complaints. Also reports n/v. Hx HTN, AAA, DM 2  VS WDL  CBG 296  Hx

## 2021-03-03 ENCOUNTER — Other Ambulatory Visit: Payer: Self-pay | Admitting: Critical Care Medicine

## 2021-03-03 NOTE — Telephone Encounter (Signed)
Future OV 03/05/21 Approved per protocol. Requested Prescriptions  Pending Prescriptions Disp Refills  . lisinopril (ZESTRIL) 2.5 MG tablet [Pharmacy Med Name: LISINOPRIL 2.5 MG Tablet] 30 tablet 0    Sig: TAKE 1 TABLET EVERY DAY     Cardiovascular:  ACE Inhibitors Failed - 03/03/2021  4:31 PM      Failed - Cr in normal range and within 180 days    Creatinine, Ser  Date Value Ref Range Status  02/27/2021 1.74 (H) 0.61 - 1.24 mg/dL Final         Failed - Last BP in normal range    BP Readings from Last 1 Encounters:  02/27/21 (!) 116/95         Passed - K in normal range and within 180 days    Potassium  Date Value Ref Range Status  02/27/2021 3.6 3.5 - 5.1 mmol/L Final         Passed - Patient is not pregnant      Passed - Valid encounter within last 6 months    Recent Outpatient Visits          3 weeks ago Uncontrolled type 2 diabetes mellitus with hyperglycemia [facility])   [place], Annie Main L, RPH-CPP   1 month ago Uncontrolled type 2 diabetes mellitus with hyperglycemia [facility])   [place], Jarome Matin, RPH-CPP   2 months ago Uncontrolled type 2 diabetes mellitus with hyperglycemia [facility])   [place] Elsie Stain, MD   3 months ago Uncontrolled type 2 diabetes mellitus with hyperglycemia [facility])   [place], Annie Main L, RPH-CPP   4 months ago Uncontrolled type 2 diabetes mellitus with hyperglycemia [facility])   [place] Elsie Stain, MD      Future Appointments            In 2 days Daisy Blossom, Jarome Matin, [place]

## 2021-03-04 NOTE — Progress Notes (Addendum)
S:    PCP: Dr. Joya Gaskins PMH: HTN, DM, CVA, CKD-3  Patient arrives in good spirits. Presents for diabetes evaluation, education, and management. Patient was referred and last seen by Primary Care Provider on 12/17/20. At last CPP visit on 02/05/21, patient continued to have confusion with insulin regimen. Had him continue Tresiba 66 units daily and increase Novolog to 38 units with breakfast and lunch and increase to 24 units with dinner. Started lisinopril 2.5 mg daily for renal protection in presence of T2DM and CKD.   Today, patient has been compliant with the insulin changes made at last visit and is taking lisinopril daily with no issues reported. Reports that his abdominal pain, which he went to [facility] ED for on 8/11 but left before being seen, resolved after he had a bowel movement. He did get labs when he was in the ED which showed Scr 1.74 and K 3.6. Average 7 day BG on glucometer is 224.   Family/Social History:  -Tobacco smoker 5-6 cigs/day  Insurance coverage/medication affordability: [employer] Medicare  Medication adherence reported is appropriate.    Current diabetes medications include:  -Trulicity [id number] mg weekly on Mondays (previously had GI upset with 1.5 mg dose) -Tresiba 66 units daily - takes at 9pm -Novolog 38 units before breakfast (takes ~7am) and lunch (~3-4pm) and 24 units qPM (~7pm)  Current hypertension medications include:  -amlodipine 5 mg daily -HCTZ 25 mg daily -lisinopril 2.5 mg daily  Current hyperlipidemia medications include:  -atorvastatin 10 mg daily -fenofibrate 54 mg daily  Patient denies hypoglycemic events.  Patient-reported exercise habits: limited by walker   Patient denies nocturia (nighttime urination).  Patient denies neuropathy (nerve pain). Patient denies visual changes. Patient reports self foot exams.    O:  POCT: 136, baloney sandwich at 7am (2h before)  AM fasting: 192, 240, 247, 200, 214, 226, 206, 160, 156 PM fasting: 173, 423,  197, 215, 235, 142, 213, 151 Average BG over 7 days 224, 14 days 215  Lab Results  Component Value Date   HGBA1C 9.2 (A) 01/08/2021   There were no vitals filed for this visit.  Lipid Panel     Component Value Date/Time   CHOL 112 12/17/2020 0915   TRIG 175 (H) 12/17/2020 0915   HDL 33 (L) 12/17/2020 0915   CHOLHDL 3.4 12/17/2020 0915   LDLCALC 50 12/17/2020 0915   Clinical Atherosclerotic Cardiovascular Disease (ASCVD): Yes  (hx of stroke) The ASCVD Risk score Mikey Bussing DC Jr., et al., 2013) failed to calculate for the following reasons:   The valid total cholesterol range is 130 to 320 mg/dL   A/P:  Diabetes longstanding currently uncontrolled given none of home BGs are at goal and average from last 7 days is 224. He has made the medication changes as discussed at last visit. 2h post-prandial BG in clinic today is at goal which indicates appropriate mealtime coverage for breakfast. We will increase Tresiba and his evening Novolog to help improve his AM fasting BG and control throughout the day. Reviewed all medication changes in detail today. He is scheduled to establish with endocrinology on 8/24. He would likely benefit from a CGM given 4 insulin injections per day. Patient is able to verbalize appropriate hypoglycemia management plan. He is doing well on low dose lisinopril to provide renal benefit in setting of diabetes and concurrent CKD. CMET was checked in the ED 8/11, which showed stable Scr 1.74 (1.66 2 months ago, represents 5% increase which is acceptable).  -  Increased Tresiba to 70 units daily  -Continued Novolog 38 units before breakfast and lunch as prescribed. Increase Novolog to 26 units before dinner.  -Continued Trulicity [id number] mg weekly -Continued lisinopril 2.5 mg daily -Extensively discussed pathophysiology of diabetes, recommended lifestyle interventions, dietary effects on blood sugar control -Counseled on s/sx of and management of hypoglycemia -Next A1C anticipated  03/2021.   ASCVD risk - secondary prevention in patient with diabetes. Last LDL is controlled. High intensity statin indication -Continued atorvastatin 10 mg daily, given that his most recent LDL of 50 is at goal <55 mg/dL.   Written patient instructions provided.  Total time in face to face counseling 20 minutes.    Follow up with endocrinology 8/24, then with PCP at next available appointment.   Rebbeca Paul, PharmD PGY2 Ambulatory Care Pharmacy Resident 03/05/2021 11:12 AM

## 2021-03-05 ENCOUNTER — Other Ambulatory Visit: Payer: Self-pay

## 2021-03-05 ENCOUNTER — Ambulatory Visit: Payer: Medicare HMO | Attending: Critical Care Medicine | Admitting: Pharmacist

## 2021-03-05 DIAGNOSIS — E1165 Type 2 diabetes mellitus with hyperglycemia: Secondary | ICD-10-CM | POA: Diagnosis not present

## 2021-03-05 LAB — GLUCOSE, POCT (MANUAL RESULT ENTRY): POC Glucose: 136 mg/dl — AB (ref 70–99)

## 2021-03-05 MED ORDER — TRESIBA FLEXTOUCH 100 UNIT/ML ~~LOC~~ SOPN: 70.0000 [IU] | PEN_INJECTOR | Freq: Every day | SUBCUTANEOUS | 2 refills | Status: DC

## 2021-03-05 MED ORDER — NOVOLOG FLEXPEN 100 UNIT/ML ~~LOC~~ SOPN
PEN_INJECTOR | SUBCUTANEOUS | 3 refills | Status: DC
  Filled 2021-03-05: qty 15, 15d supply, fill #0

## 2021-03-12 ENCOUNTER — Encounter: Payer: Self-pay | Admitting: Internal Medicine

## 2021-03-12 ENCOUNTER — Other Ambulatory Visit: Payer: Self-pay

## 2021-03-12 ENCOUNTER — Ambulatory Visit (INDEPENDENT_AMBULATORY_CARE_PROVIDER_SITE_OTHER): Payer: Medicare HMO | Admitting: Internal Medicine

## 2021-03-12 VITALS — BP 118/86 | HR 94 | Ht 67.0 in | Wt 240.6 lb

## 2021-03-12 DIAGNOSIS — E1122 Type 2 diabetes mellitus with diabetic chronic kidney disease: Secondary | ICD-10-CM | POA: Diagnosis not present

## 2021-03-12 DIAGNOSIS — E785 Hyperlipidemia, unspecified: Secondary | ICD-10-CM | POA: Diagnosis not present

## 2021-03-12 DIAGNOSIS — E1169 Type 2 diabetes mellitus with other specified complication: Secondary | ICD-10-CM | POA: Diagnosis not present

## 2021-03-12 DIAGNOSIS — E1142 Type 2 diabetes mellitus with diabetic polyneuropathy: Secondary | ICD-10-CM | POA: Diagnosis not present

## 2021-03-12 DIAGNOSIS — Z794 Long term (current) use of insulin: Secondary | ICD-10-CM | POA: Diagnosis not present

## 2021-03-12 DIAGNOSIS — N1832 Chronic kidney disease, stage 3b: Secondary | ICD-10-CM

## 2021-03-12 DIAGNOSIS — E1165 Type 2 diabetes mellitus with hyperglycemia: Secondary | ICD-10-CM | POA: Diagnosis not present

## 2021-03-12 MED ORDER — DEXCOM G6 SENSOR MISC: 1.0000 | 3 refills | Status: DC

## 2021-03-12 MED ORDER — DEXCOM G6 TRANSMITTER MISC: 1.0000 | 3 refills | Status: DC

## 2021-03-12 MED ORDER — DAPAGLIFLOZIN PROPANEDIOL 5 MG PO TABS
5.0000 mg | ORAL_TABLET | Freq: Every day | ORAL | 1 refills | Status: DC
  Filled 2021-03-12: qty 90, 90d supply, fill #0

## 2021-03-12 MED ORDER — FENOFIBRATE 134 MG PO CAPS
134.0000 mg | ORAL_CAPSULE | Freq: Every day | ORAL | 3 refills | Status: DC
  Filled 2021-03-12: qty 30, 30d supply, fill #0

## 2021-03-12 NOTE — Patient Instructions (Addendum)
-   Start farxiga 1 tablet every morning  - Continue Trulicity [id number] mg weekly  - Continue Tresiba 70 units once daily  - Continue Novolog 38 units with Breakfast, 38 units with Lunch and 26 units with Supper    - I have increased Fenofibrate to 134 mg daily  - Continue Lipitor 10 mg daily    HOW TO TREAT LOW BLOOD SUGARS (Blood sugar LESS THAN 70 MG/DL) Please follow the RULE OF 15 for the treatment of hypoglycemia treatment (when your (blood sugars are less than 70 mg/dL)   STEP 1: Take 15 grams of carbohydrates when your blood sugar is low, which includes:  3-4 GLUCOSE TABS  OR 3-4 OZ OF JUICE OR REGULAR SODA OR ONE TUBE OF GLUCOSE GEL    STEP 2: RECHECK blood sugar in 15 MINUTES STEP 3: If your blood sugar is still low at the 15 minute recheck --> then, go back to STEP 1 and treat AGAIN with another 15 grams of carbohydrates.

## 2021-03-12 NOTE — Progress Notes (Signed)
Name: Reginald Rodriguez  MRN/ DOB: [id number], 09/27/1950   Age/ Sex: 70 y.o., male    PCP: Elsie Stain, MD   Reason for Endocrinology Evaluation: Type 2 Diabetes Mellitus     Date of Initial Endocrinology Visit: 03/12/2021     PATIENT IDENTIFIER: Reginald Rodriguez is a 70 y.o. male with a past medical history of DM, HTN , COPD , Hx of cocaine abuse and Dyslipidemia. The patient presented for initial endocrinology clinic visit on 03/12/2021 for consultative assistance with his diabetes management.    HPI: Mr. Louque was    Diagnosed with DM yes ago  Prior Medications tried/Intolerance: Metformin- made him feel funny . Intolerant to Trulicity 1.5 mg weekly dose  Currently checking blood sugars 1-3 x / day Hypoglycemia episodes : no                Hemoglobin A1c has ranged from 7.7%, peaking at 9.7% in 2021. Patient required assistance for hypoglycemia: no Patient has required hospitalization within the last 1 year from hyper or hypoglycemia: no  In terms of diet, the patient eats 5 meal a day . Avoids sugar sweetened beverages    HOME DIABETES REGIMEN: Tresiba 70 units daily at night  Novolog 38 units  with breakfast  and Lunch and 26 units with supper  Trulicity 0.93 mg weekly    Statin: yes ACE-I/ARB: yes    METER DOWNLOAD SUMMARY: Did not bring       DIABETIC COMPLICATIONS: Microvascular complications:  CKD III, neuropathy  Denies: retinopathy  Last eye exam: Completed 2022  Macrovascular complications:  CVA ( Right hemiparesis )  Denies: CAD, PVD   PAST HISTORY: Past Medical History: No past medical history on file. Past Surgical History: No past surgical history on file.  Social History:  reports that he has been smoking. He has quit using smokeless tobacco. He reports that he does not currently use alcohol. He reports that he does not currently use drugs. Family History: No family history on file.   HOME MEDICATIONS: Allergies as of 03/12/2021        Reactions   Bee Venom Swelling   Penicillins Rash        Medication List        Accurate as of March 12, 2021  9:58 AM. If you have any questions, ask your nurse or doctor.          STOP taking these medications    fenofibrate 54 MG tablet Stopped by: Dorita Sciara, MD       TAKE these medications    Accu-Chek Guide test strip Generic drug: glucose blood USE TO CHECK BLOOD SUGAR 2 TIMES DAILY   Accu-Chek Guide w/Device Kit USE TO TEST BLOOD SUGAR   Accu-Chek Guide w/Device Kit USE TO TEST BLOOD SUGAR   Accu-Chek Softclix Lancets lancets Use to check blood sugar TWICE DAILY.   amLODipine 5 MG tablet Commonly known as: NORVASC TAKE 1 TABLET (5 MG TOTAL) BY MOUTH DAILY.   aspirin 81 MG EC tablet Take 1 tablet (81 mg total) by mouth daily.   atorvastatin 10 MG tablet Commonly known as: LIPITOR TAKE 1 TABLET (10 MG TOTAL) BY MOUTH DAILY.   BD Pen Needle Nano 2nd Gen 32G X 4 MM Misc Generic drug: Insulin Pen Needle use as directed 4 times daily   BD Pen Needle Nano U/F 32G X 4 MM Misc Generic drug: Insulin Pen Needle USE 4 TIMES DAILY AS DIRECTED   chlorthalidone 25  MG tablet Commonly known as: HYGROTON TAKE 1 TABLET (25 MG TOTAL) BY MOUTH DAILY.   dapagliflozin propanediol 5 MG Tabs tablet Commonly known as: Farxiga Take 1 tablet (5 mg total) by mouth daily before breakfast. Started by: Dorita Sciara, MD   Dexcom G6 Sensor Misc 1 Device by Does not apply route as directed. Started by: Dorita Sciara, MD   Dexcom G6 Transmitter Misc 1 Device by Does not apply route as directed. Started by: Dorita Sciara, MD   fenofibrate micronized 134 MG capsule Commonly known as: LOFIBRA Take 1 capsule (134 mg total) by mouth daily before breakfast. Started by: Dorita Sciara, MD   gabapentin 100 MG capsule Commonly known as: NEURONTIN TAKE 1 CAPSULE (100 MG TOTAL) BY MOUTH 2 (TWO) TIMES DAILY.   gabapentin 100 MG  capsule Commonly known as: NEURONTIN TAKE 1 CAPSULE (100 MG TOTAL) BY MOUTH 2 (TWO) TIMES DAILY.   lactulose (encephalopathy) 10 GM/15ML Soln Commonly known as: CHRONULAC Take 15 mLs (10 g total) by mouth daily.   lisinopril 2.5 MG tablet Commonly known as: ZESTRIL TAKE 1 TABLET EVERY DAY   NovoLOG FlexPen 100 UNIT/ML FlexPen Generic drug: insulin aspart Inject 38 units into the skin at breakfast , 38 units at lunch, and 26 units at dinner   Tresiba FlexTouch 100 UNIT/ML FlexTouch Pen Generic drug: insulin degludec Inject 70 Units into the skin at bedtime.   Trulicity 1.60 FU/9.3AT Sopn Generic drug: Dulaglutide INJECT 0.75MG (1 PEN) SUBCUTANEOUSLY EVERY WEEK         ALLERGIES: Allergies  Allergen Reactions   Bee Venom Swelling   Penicillins Rash     REVIEW OF SYSTEMS: A comprehensive ROS was conducted with the patient and is negative except as per HPI and below:  Review of Systems  Gastrointestinal:  Negative for diarrhea, nausea and vomiting.  Neurological:  Positive for tingling.     OBJECTIVE:   VITAL SIGNS: BP 118/86   Pulse 94   Ht _0  (1.702 m)   Wt 240 lb 9.6 oz (109.1 kg)   SpO2 97%   BMI 37.68 kg/m    PHYSICAL EXAM:  General: Pt appears well and is in NAD with a walker  Neck: General: Supple without adenopathy or carotid bruits. Thyroid: Thyroid size normal.  No goiter or nodules appreciated.  Lungs: Clear with good BS bilat with no rales, rhonchi, or wheezes  Heart: RRR with normal S1 and S2 and no gallops; no murmurs; no rub  Abdomen: Normoactive bowel sounds, soft, nontender, without masses or organomegaly palpable  Extremities:  Lower extremities - No pretibial edema. No lesions.  Skin: Normal texture and temperature to palpation. No rash noted. No Acanthosis nigricans/skin tags. No lipohypertrophy.  Neuro: MS is good with appropriate affect, pt is alert and Ox3     DATA REVIEWED:  Lab Results  Component Value Date   HGBA1C 9.2  (A) 01/08/2021   HGBA1C 8.5 (A) 10/15/2020   HGBA1C 9.7 (A) 06/27/2020   Lab Results  Component Value Date   LDLCALC 50 12/17/2020   CREATININE 1.74 (H) 02/27/2021   Lab Results  Component Value Date   MICRALBCREAT 13 06/27/2020    Lab Results  Component Value Date   CHOL 112 12/17/2020   HDL 33 (L) 12/17/2020   LDLCALC 50 12/17/2020   TRIG 175 (H) 12/17/2020   CHOLHDL 3.4 12/17/2020        ASSESSMENT / PLAN / RECOMMENDATIONS:   1) Type 2 Diabetes Mellitus,  Poorly controlled, With Neuropathic, CKD III and macrovascular  complications - Most recent A1c of 9.2 %. Goal A1c < 7.0 %.    Plan: GENERAL: I have discussed with the patient the pathophysiology of diabetes. We went over the natural progression of the disease. We talked about both insulin resistance and insulin deficiency. We stressed the importance of lifestyle changes including diet and exercise. I explained the complications associated with diabetes including retinopathy, nephropathy, neuropathy as well as increased risk of cardiovascular disease. We went over the benefit seen with glycemic control.   I explained to the patient that diabetic patients are at higher than normal risk for amputations. Discussed add-on therapy with SGLT2 inhibitors, I have cautioned him against genital infections He is intolerant to higher doses of Trulicity, apparently was on 1.5 mg at some point but this had to be decreased. I would not change his insulin regimen at this time due to lack of glucose data I have prescribed Dexcom, patient to notify my office when he receives it so we can refer him for training  MEDICATIONS: Start farxiga 1 tablet every morning  - Continue Trulicity 8.41 mg weekly  - Continue Tresiba 70 units once daily  - Continue Novolog 38 units with Breakfast, 38 units with Lunch and 26 units with Supper    EDUCATION / INSTRUCTIONS: BG monitoring instructions: Patient is instructed to check his blood sugars 3 times a  day, before meals. Call [facility] if: BG persistently < 70  I reviewed the Rule of 15 for the treatment of hypoglycemia in detail with the patient. Literature supplied.   2) Diabetic complications:  Eye: Does not have known diabetic retinopathy.  Neuro/ Feet: Does  have known diabetic peripheral neuropathy. Renal: Patient does  have known baseline CKD. He is  on an ACEI/ARB at present.   3)Dyslipidemia :   - LDL at goal at 29 mg/dL , Tg elevated  -I am going to increase his fenofibrate  Continue atorvastatin 10 mg daily  Increase fenofibrate 134 mg        Signed electronically by: Mack Guise, MD  [facility]  [facility] [address] [place], [place] [address] Phone: [phone] FAX: [phone]   CC: Elsie Stain, MD [address] [place] [address] Phone: [phone]  Fax: [phone]    Return to Endocrinology clinic as below: No future appointments.

## 2021-03-13 ENCOUNTER — Other Ambulatory Visit: Payer: Self-pay

## 2021-03-13 DIAGNOSIS — Z794 Long term (current) use of insulin: Secondary | ICD-10-CM | POA: Insufficient documentation

## 2021-03-13 DIAGNOSIS — N1832 Chronic kidney disease, stage 3b: Secondary | ICD-10-CM | POA: Insufficient documentation

## 2021-03-13 DIAGNOSIS — E1165 Type 2 diabetes mellitus with hyperglycemia: Secondary | ICD-10-CM | POA: Insufficient documentation

## 2021-03-13 DIAGNOSIS — E1142 Type 2 diabetes mellitus with diabetic polyneuropathy: Secondary | ICD-10-CM | POA: Insufficient documentation

## 2021-03-13 DIAGNOSIS — E1122 Type 2 diabetes mellitus with diabetic chronic kidney disease: Secondary | ICD-10-CM | POA: Insufficient documentation

## 2021-03-17 ENCOUNTER — Other Ambulatory Visit: Payer: Self-pay | Admitting: Critical Care Medicine

## 2021-03-17 ENCOUNTER — Other Ambulatory Visit: Payer: Self-pay

## 2021-03-17 NOTE — Progress Notes (Addendum)
Subjective:   Reginald Rodriguez is a 70 y.o. male who presents for an Initial Medicare Annual Wellness Visit. I connected with  [name] on 03/22/21 by an audio only telemedicine application and verified that I am speaking with the correct person using two identifiers.   I discussed the limitations, risks, security and privacy concerns of performing an evaluation and management service by telephone and the availability of in person appointments. I also discussed with the patient that there may be a patient responsible charge related to this service. The patient expressed understanding and verbally consented to this telephonic visit.  Location of Patient: Home  Location of Provider: Office  List any persons and their role that are participating in the visit with the patient.   Kristen Cardinal  Review of Systems    Defer to PCP Cardiac Risk Factors include: smoking/ tobacco exposure     Objective:    There were no vitals filed for this visit. There is no height or weight on file to calculate BMI.  Advanced Directives 03/22/2021 02/27/2021  Does Patient Have a Medical Advance Directive? No No  Would patient like information on creating a medical advance directive? No - Patient declined -    Current Medications (verified) Outpatient Encounter Medications as of 03/22/2021  Medication Sig   ACCU-CHEK GUIDE test strip USE TO CHECK BLOOD SUGAR 2 TIMES DAILY   Accu-Chek Softclix Lancets lancets Use to check blood sugar TWICE DAILY.   amLODipine (NORVASC) 5 MG tablet TAKE 1 TABLET (5 MG TOTAL) BY MOUTH DAILY.   aspirin 81 MG EC tablet Take 1 tablet (81 mg total) by mouth daily.   atorvastatin (LIPITOR) 10 MG tablet TAKE 1 TABLET (10 MG TOTAL) BY MOUTH DAILY.   BD PEN NEEDLE NANO U/F 32G X 4 MM MISC USE 4 TIMES DAILY AS DIRECTED   Blood Glucose Monitoring Suppl (ACCU-CHEK GUIDE) w/Device KIT USE TO TEST BLOOD SUGAR   chlorthalidone (HYGROTON) 25 MG tablet TAKE 1 TABLET (25 MG  TOTAL) BY MOUTH DAILY.   dapagliflozin propanediol (FARXIGA) 5 MG TABS tablet Take 1 tablet (5 mg total) by mouth daily before breakfast.   fenofibrate micronized (LOFIBRA) 134 MG capsule Take 1 capsule (134 mg total) by mouth daily before breakfast.   gabapentin (NEURONTIN) 100 MG capsule TAKE 1 CAPSULE (100 MG TOTAL) BY MOUTH 2 (TWO) TIMES DAILY.   lactulose, encephalopathy, (CHRONULAC) 10 GM/15ML SOLN Take 15 mLs (10 g total) by mouth daily.   lisinopril (ZESTRIL) 2.5 MG tablet Take 1 tablet (2.5 mg total) by mouth daily.   NOVOLOG FLEXPEN 100 UNIT/ML FlexPen Inject 38 units into the skin at breakfast , 38 units at lunch, and 26 units at dinner   TRESIBA FLEXTOUCH 100 UNIT/ML FlexTouch Pen Inject 70 Units into the skin at bedtime.   TRULICITY 3.64 WO/0.3OZ SOPN INJECT 0.75MG (1 PEN) SUBCUTANEOUSLY EVERY WEEK   [DISCONTINUED] ACCU-CHEK GUIDE test strip USE TO CHECK BLOOD SUGAR 2 TIMES DAILY (Patient taking differently: USE TO CHECK BLOOD SUGAR 2 TIMES DAILY)   [DISCONTINUED] Accu-Chek Softclix Lancets lancets Use to check blood sugar TWICE DAILY. (Patient taking differently: Use to check blood sugar TWICE DAILY.)   [DISCONTINUED] BD PEN NEEDLE NANO U/F 32G X 4 MM MISC USE 4 TIMES DAILY AS DIRECTED   [DISCONTINUED] Blood Glucose Monitoring Suppl (ACCU-CHEK GUIDE) w/Device KIT USE TO TEST BLOOD SUGAR   [DISCONTINUED] Continuous Blood Gluc Sensor (DEXCOM G6 SENSOR) MISC 1 Device by Does not apply route as directed.   [  DISCONTINUED] Continuous Blood Gluc Transmit (DEXCOM G6 TRANSMITTER) MISC 1 Device by Does not apply route as directed.   [DISCONTINUED] gabapentin (NEURONTIN) 100 MG capsule TAKE 1 CAPSULE (100 MG TOTAL) BY MOUTH 2 (TWO) TIMES DAILY.   [DISCONTINUED] gabapentin (NEURONTIN) 100 MG capsule TAKE 1 CAPSULE (100 MG TOTAL) BY MOUTH 2 (TWO) TIMES DAILY.   [DISCONTINUED] Insulin Pen Needle (BD PEN NEEDLE NANO 2ND GEN) 32G X 4 MM MISC use as directed 4 times daily   [DISCONTINUED] lisinopril  (ZESTRIL) 2.5 MG tablet TAKE 1 TABLET EVERY DAY   No facility-administered encounter medications on file as of 03/22/2021.    Allergies (verified) Bee venom and Penicillins   History: History reviewed. No pertinent past medical history. History reviewed. No pertinent surgical history. History reviewed. No pertinent family history. Social History   Socioeconomic History   Marital status: Single    Spouse name: Not on file   Number of children: Not on file   Years of education: Not on file   Highest education level: Not on file  Occupational History   Not on file  Tobacco Use   Smoking status: Every Day   Smokeless tobacco: Former  Scientific laboratory technician Use: Never used  Substance and Sexual Activity   Alcohol use: Not Currently   Drug use: Not Currently   Sexual activity: Not on file  Other Topics Concern   Not on file  Social History Narrative   Not on file   Social Determinants of Health   Financial Resource Strain: Not on file  Food Insecurity: Not on file  Transportation Needs: Not on file  Physical Activity: Not on file  Stress: Not on file  Social Connections: Not on file    Tobacco Counseling Ready to quit: Not Answered Counseling given: Not Answered   Clinical Intake:  Pre-visit preparation completed: Yes  Pain : No/denies pain     Nutritional Risks: None Diabetes: Yes CBG done?: No Did pt. bring in CBG monitor from home?: No     Diabetic? Yes  Interpreter Needed?: No      Activities of Daily Living In your present state of health, do you have any difficulty performing the following activities: 03/22/2021 06/27/2020  Hearing? N N  Vision? N N  Difficulty concentrating or making decisions? N N  Walking or climbing stairs? N Y  Dressing or bathing? Y N  Doing errands, shopping? N N  Preparing Food and eating ? N -  Using the Toilet? N -  In the past six months, have you accidently leaked urine? N -  Do you have problems with loss of  bowel control? N -  Managing your Medications? N -  Managing your Finances? N -  Housekeeping or managing your Housekeeping? N -    Patient Care Team: Elsie Stain, MD as PCP - General (Pulmonary Disease)  Indicate any recent Medical Services you may have received from other than [facility] providers in the past year (date may be approximate).     Assessment:   This is a routine wellness examination for Jiles.  Hearing/Vision screen No results found.  Dietary issues and exercise activities discussed: Current Exercise Habits: Home exercise routine, Type of exercise: strength training/weights, Frequency (Times/Week): 3, Exercise limited by: None identified   Goals Addressed   None    Depression Screen PHQ 2/9 Scores 10/15/2020 06/27/2020 04/15/2020  PHQ - 2 Score 0 0 0  PHQ- 9 Score 2 - -    Fall Risk  Fall Risk  03/22/2021 03/19/2021 10/15/2020 08/27/2020 06/27/2020  Falls in the past year? 0 0 0 0 0  Number falls in past yr: 0 0 0 0 0  Injury with Fall? 0 0 0 0 0  Risk for fall due to : - No Fall Risks - Impaired mobility;Impaired balance/gait No Fall Risks  Follow up - - - - Falls evaluation completed    FALL RISK PREVENTION PERTAINING TO THE HOME:  Any stairs in or around the home? No  If so, are there any without handrails? No  Home free of loose throw rugs in walkways, pet beds, electrical cords, etc? No  Adequate lighting in your home to reduce risk of falls? Yes   ASSISTIVE DEVICES UTILIZED TO PREVENT FALLS:  Life alert? No  Use of a cane, walker or w/c? Yes  Grab bars in the bathroom? Yes  Shower chair or bench in shower? No  Elevated toilet seat or a handicapped toilet? No   TIMED UP AND GO:  Was the test performed?  N/A .  Length of time to ambulate 10 feet: N/A sec.   *These questions cannot be answered via Telephone Encounter  Cognitive Function:     6CIT Screen 03/22/2021  What Year? 0 points  What month? 0 points  What time? 0 points  Count back  from 20 0 points  Months in reverse 0 points    Immunizations Immunization History  Administered Date(s) Administered   Influenza,inj,Quad PF,6+ Mos 04/15/2020, 03/19/2021   PFIZER Comirnaty(Gray Top)Covid-19 Tri-Sucrose Vaccine 09/11/2020, 01/22/2021   PFIZER(Purple Top)SARS-COV-2 Vaccination 11/09/2019, 12/07/2019   Pneumococcal Conjugate-13 04/15/2020   Tdap 04/15/2017    TDAP status: Up to date  Flu Vaccine status: Up to date  Pneumococcal vaccine status: Up to date  Covid-19 vaccine status: Completed vaccines  Qualifies for Shingles Vaccine? Yes   Zostavax completed No   Shingrix Completed?: No.    Education has been provided regarding the importance of this vaccine. Patient has been advised to call insurance company to determine out of pocket expense if they have not yet received this vaccine. Advised may also receive vaccine at local pharmacy or Health Dept. Verbalized acceptance and understanding.  Screening Tests Health Maintenance  Topic Date Due   Zoster Vaccines- Shingrix (1 of 2) Never done   PNA vac Low Risk Adult (2 of 2 - PPSV23) 04/15/2021   FOOT EXAM  06/27/2021   HEMOGLOBIN A1C  07/10/2021   OPHTHALMOLOGY EXAM  08/07/2021   TETANUS/TDAP  04/16/2027   COLONOSCOPY (Pts 45-57yr Insurance coverage will need to be confirmed)  07/24/2029   INFLUENZA VACCINE  Completed   COVID-19 Vaccine  Completed   Hepatitis C Screening  Completed   HPV VACCINES  Aged Out    Health Maintenance  Health Maintenance Due  Topic Date Due   Zoster Vaccines- Shingrix (1 of 2) Never done    Colorectal cancer screening: Type of screening: Colonoscopy. Completed 07/25/19. Repeat every 10 years  Lung Cancer Screening: (Low Dose CT Chest recommended if Age 68-80years, 30 pack-year currently smoking OR have quit w/in 15years.) does qualify.   Lung Cancer Screening Referral: Placed order on 03/22/21  Additional Screening:  Hepatitis C Screening: does qualify; Completed  04/15/20  Vision Screening: Recommended annual ophthalmology exams for early detection of glaucoma and other disorders of the eye. Is the patient up to date with their annual eye exam?  Yes  Who is the provider or what is the name of the office  in which the patient attends annual eye exams? N/A If pt is not established with a provider, would they like to be referred to a provider to establish care? No .   Dental Screening: Recommended annual dental exams for proper oral hygiene  Community Resource Referral / Chronic Care Management: CRR required this visit?  No   CCM required this visit?  Yes      Plan:     I have personally reviewed and noted the following in the patient's chart:   Medical and social history Use of alcohol, tobacco or illicit drugs  Current medications and supplements including opioid prescriptions. Patient is not currently taking opioid prescriptions. Functional ability and status Nutritional status Physical activity Advanced directives List of other physicians Hospitalizations, surgeries, and ER visits in previous 12 months Vitals Screenings to include cognitive, depression, and falls Referrals and appointments  In addition, I have reviewed and discussed with patient certain preventive protocols, quality metrics, and best practice recommendations. A written personalized care plan for preventive services as well as general preventive health recommendations were provided to patient.     Octaviano Glow, CMA   03/22/2021   Nurse Notes: Non-Face to Face 20 minute visit Encounter.   Mr. Bakos , Thank you for taking time to come for your Medicare Wellness Visit. I appreciate your ongoing commitment to your health goals. Please review the following plan we discussed and let me know if I can assist you in the future.   These are the goals we discussed:  Goals   None     This is a list of the screening recommended for you and due dates:  Health Maintenance   Topic Date Due   Zoster (Shingles) Vaccine (1 of 2) Never done   Pneumonia vaccines (2 of 2 - PPSV23) 04/15/2021   Complete foot exam   06/27/2021   Hemoglobin A1C  07/10/2021   Eye exam for diabetics  08/07/2021   Tetanus Vaccine  04/16/2027   Colon Cancer Screening  07/24/2029   Flu Shot  Completed   COVID-19 Vaccine  Completed   Hepatitis C Screening: USPSTF Recommendation to screen - Ages 18-79 yo.  Completed   HPV Vaccine  Aged Out

## 2021-03-19 ENCOUNTER — Encounter: Payer: Self-pay | Admitting: Critical Care Medicine

## 2021-03-19 ENCOUNTER — Other Ambulatory Visit: Payer: Self-pay

## 2021-03-19 ENCOUNTER — Ambulatory Visit: Payer: Medicare HMO | Attending: Critical Care Medicine | Admitting: Critical Care Medicine

## 2021-03-19 VITALS — BP 123/89 | HR 104 | Resp 16 | Wt 239.4 lb

## 2021-03-19 DIAGNOSIS — F17210 Nicotine dependence, cigarettes, uncomplicated: Secondary | ICD-10-CM | POA: Diagnosis not present

## 2021-03-19 DIAGNOSIS — E785 Hyperlipidemia, unspecified: Secondary | ICD-10-CM | POA: Diagnosis not present

## 2021-03-19 DIAGNOSIS — Z794 Long term (current) use of insulin: Secondary | ICD-10-CM

## 2021-03-19 DIAGNOSIS — N1832 Chronic kidney disease, stage 3b: Secondary | ICD-10-CM | POA: Diagnosis not present

## 2021-03-19 DIAGNOSIS — Z23 Encounter for immunization: Secondary | ICD-10-CM

## 2021-03-19 DIAGNOSIS — E1122 Type 2 diabetes mellitus with diabetic chronic kidney disease: Secondary | ICD-10-CM | POA: Diagnosis not present

## 2021-03-19 DIAGNOSIS — E1165 Type 2 diabetes mellitus with hyperglycemia: Secondary | ICD-10-CM

## 2021-03-19 DIAGNOSIS — E1169 Type 2 diabetes mellitus with other specified complication: Secondary | ICD-10-CM

## 2021-03-19 DIAGNOSIS — I10 Essential (primary) hypertension: Secondary | ICD-10-CM | POA: Diagnosis not present

## 2021-03-19 DIAGNOSIS — Z720 Tobacco use: Secondary | ICD-10-CM

## 2021-03-19 MED ORDER — GABAPENTIN 100 MG PO CAPS
ORAL_CAPSULE | Freq: Two times a day (BID) | ORAL | 1 refills | Status: DC
  Filled 2021-03-19: qty 60, 30d supply, fill #0

## 2021-03-19 MED ORDER — ACCU-CHEK SOFTCLIX LANCETS MISC
2 refills | Status: DC
Start: 2021-03-19 — End: 2021-06-16
  Filled 2021-03-19: qty 100, 25d supply, fill #0
  Filled 2021-05-01: qty 100, 25d supply, fill #1

## 2021-03-19 MED ORDER — BD PEN NEEDLE NANO U/F 32G X 4 MM MISC
3 refills | Status: DC
  Filled 2021-03-19: qty 100, 25d supply, fill #0

## 2021-03-19 MED ORDER — ACCU-CHEK GUIDE VI STRP
ORAL_STRIP | 2 refills | Status: DC
  Filled 2021-03-19: qty 100, 25d supply, fill #0
  Filled 2021-05-01: qty 100, 25d supply, fill #1

## 2021-03-19 MED ORDER — LISINOPRIL 2.5 MG PO TABS
2.5000 mg | ORAL_TABLET | Freq: Every day | ORAL | 1 refills | Status: DC
  Filled 2021-03-19: qty 90, 90d supply, fill #0

## 2021-03-19 NOTE — Assessment & Plan Note (Signed)
  .   Current smoking consumption amount: Several cigarettes a day  . Dicsussion on advise to quit smoking and smoking impacts: Cardiovascular impact  . Patient's willingness to quit: Willing to quit  . Methods to quit smoking discussed: Nicotine replacement  . Medication management of smoking session drugs discussed: Nicotine replacement  . Resources provided:  AVS   . Setting quit date not established  . Follow-up arranged 2 months   Time spent counseling the patient: 5 minutes

## 2021-03-19 NOTE — Assessment & Plan Note (Signed)
Care per endocrinology no changes made refills given

## 2021-03-19 NOTE — Assessment & Plan Note (Signed)
Continue with cholesterol therapy

## 2021-03-19 NOTE — Assessment & Plan Note (Signed)
Maintain current blood pressure medication profile

## 2021-03-19 NOTE — Assessment & Plan Note (Signed)
Renal function stable.

## 2021-03-19 NOTE — Patient Instructions (Signed)
Refill sent to our pharmacy  Flu vaccine given  Keep up your excellent blood sugar control no change in medications  work on smoking cessation

## 2021-03-19 NOTE — Progress Notes (Signed)
Established Patient Office Visit  Subjective:  Patient ID: Reginald Rodriguez, male    DOB: [birth year]  Age: 70 y.o. MRN: [id number]  CC:  Chief Complaint  Patient presents with   Hypertension    HPI Reginald Rodriguez presents for follow-up of type 2 diabetes.  Patient recently saw endocrinology and had Farxiga added to his program.  Patient maintains his insulin as well as Trulicity.  Patient needs refills on several medications at this visit.  His abdominal pain is better with bowel program.  He has no other real complaints at this time.  Patient had fenofibrate dosing increased.  He is maintaining same dose of Tresiba and Humalog  Patient is still living in transitional housing he is still smoking 7 to 8 cigarettes daily     History reviewed. No pertinent past medical history.  History reviewed. No pertinent surgical history.  History reviewed. No pertinent family history.  Social History   Socioeconomic History   Marital status: Single    Spouse name: Not on file   Number of children: Not on file   Years of education: Not on file   Highest education level: Not on file  Occupational History   Not on file  Tobacco Use   Smoking status: Every Day   Smokeless tobacco: Former  Scientific laboratory technician Use: Never used  Substance and Sexual Activity   Alcohol use: Not Currently   Drug use: Not Currently   Sexual activity: Not on file  Other Topics Concern   Not on file  Social History Narrative   Not on file   Social Determinants of Health   Financial Resource Strain: Not on file  Food Insecurity: Not on file  Transportation Needs: Not on file  Physical Activity: Not on file  Stress: Not on file  Social Connections: Not on file  Intimate Partner Violence: Not on file    Outpatient Medications Prior to Visit  Medication Sig Dispense Refill   amLODipine (NORVASC) 5 MG tablet TAKE 1 TABLET (5 MG TOTAL) BY MOUTH DAILY. 90 tablet 1   aspirin 81 MG EC tablet Take 1 tablet  (81 mg total) by mouth daily. 100 tablet 1   atorvastatin (LIPITOR) 10 MG tablet TAKE 1 TABLET (10 MG TOTAL) BY MOUTH DAILY. 90 tablet 2   Blood Glucose Monitoring Suppl (ACCU-CHEK GUIDE) w/Device KIT USE TO TEST BLOOD SUGAR 1 kit 0   chlorthalidone (HYGROTON) 25 MG tablet TAKE 1 TABLET (25 MG TOTAL) BY MOUTH DAILY. 90 tablet 1   dapagliflozin propanediol (FARXIGA) 5 MG TABS tablet Take 1 tablet (5 mg total) by mouth daily before breakfast. 90 tablet 1   fenofibrate micronized (LOFIBRA) 134 MG capsule Take 1 capsule (134 mg total) by mouth daily before breakfast. 90 capsule 3   lactulose, encephalopathy, (CHRONULAC) 10 GM/15ML SOLN Take 15 mLs (10 g total) by mouth daily. 236 mL 1   NOVOLOG FLEXPEN 100 UNIT/ML FlexPen Inject 38 units into the skin at breakfast , 38 units at lunch, and 26 units at dinner 15 mL 3   TRESIBA FLEXTOUCH 100 UNIT/ML FlexTouch Pen Inject 70 Units into the skin at bedtime. 30 mL 2   TRULICITY 6.43 PI/9.5JO SOPN INJECT 0.75MG (1 PEN) SUBCUTANEOUSLY EVERY WEEK 2 mL 4   ACCU-CHEK GUIDE test strip USE TO CHECK BLOOD SUGAR 2 TIMES DAILY (Patient taking differently: USE TO CHECK BLOOD SUGAR 2 TIMES DAILY) 100 strip 2   Accu-Chek Softclix Lancets lancets Use to check blood sugar TWICE  DAILY. (Patient taking differently: Use to check blood sugar TWICE DAILY.) 100 each 2   BD PEN NEEDLE NANO U/F 32G X 4 MM MISC USE 4 TIMES DAILY AS DIRECTED 200 each 0   Blood Glucose Monitoring Suppl (ACCU-CHEK GUIDE) w/Device KIT USE TO TEST BLOOD SUGAR     Continuous Blood Gluc Sensor (DEXCOM G6 SENSOR) MISC 1 Device by Does not apply route as directed. 9 each 3   Continuous Blood Gluc Transmit (DEXCOM G6 TRANSMITTER) MISC 1 Device by Does not apply route as directed. 1 each 3   gabapentin (NEURONTIN) 100 MG capsule TAKE 1 CAPSULE (100 MG TOTAL) BY MOUTH 2 (TWO) TIMES DAILY. 180 capsule 1   gabapentin (NEURONTIN) 100 MG capsule TAKE 1 CAPSULE (100 MG TOTAL) BY MOUTH 2 (TWO) TIMES DAILY. 180 capsule  1   Insulin Pen Needle (BD PEN NEEDLE NANO 2ND GEN) 32G X 4 MM MISC use as directed 4 times daily 100 each 0   lisinopril (ZESTRIL) 2.5 MG tablet TAKE 1 TABLET EVERY DAY 30 tablet 0   No facility-administered medications prior to visit.    Allergies  Allergen Reactions   Bee Venom Swelling   Penicillins Rash    ROS Review of Systems  HENT: Negative.    Eyes: Negative.   Respiratory: Negative.    Gastrointestinal: Negative.   Genitourinary: Negative.   Musculoskeletal: Negative.      Objective:    Physical Exam Constitutional:      Appearance: He is obese.  HENT:     Head: Normocephalic and atraumatic.     Nose: Nose normal.     Mouth/Throat:     Mouth: Mucous membranes are moist.     Pharynx: Oropharynx is clear.  Eyes:     Extraocular Movements: Extraocular movements intact.     Conjunctiva/sclera: Conjunctivae normal.     Pupils: Pupils are equal, round, and reactive to light.  Cardiovascular:     Rate and Rhythm: Normal rate and regular rhythm.     Pulses: Normal pulses.     Heart sounds: Normal heart sounds.  Pulmonary:     Effort: Pulmonary effort is normal.     Breath sounds: Normal breath sounds.  Abdominal:     General: Abdomen is flat. Bowel sounds are normal.  Musculoskeletal:        General: Normal range of motion.     Cervical back: Normal range of motion.  Skin:    General: Skin is warm.  Neurological:     General: No focal deficit present.     Mental Status: He is alert. Mental status is at baseline.  Psychiatric:        Mood and Affect: Mood normal.        Behavior: Behavior normal.    BP 123/89   Pulse (!) 104   Resp 16   Wt 239 lb 6.4 oz (108.6 kg)   SpO2 98%   BMI 37.50 kg/m  Wt Readings from Last 3 Encounters:  03/19/21 239 lb 6.4 oz (108.6 kg)  03/12/21 240 lb 9.6 oz (109.1 kg)  10/15/20 242 lb 9.6 oz (110 kg)     There are no preventive care reminders to display for this patient.   There are no preventive care reminders  to display for this patient.  No results found for: TSH Lab Results  Component Value Date   WBC 8.3 02/27/2021   HGB 14.6 02/27/2021   HCT 44.2 02/27/2021   MCV 91.3 02/27/2021  PLT 269 02/27/2021   Lab Results  Component Value Date   NA 139 02/27/2021   K 3.6 02/27/2021   CO2 26 02/27/2021   GLUCOSE 224 (H) 02/27/2021   BUN 23 02/27/2021   CREATININE 1.74 (H) 02/27/2021   BILITOT 0.6 02/27/2021   ALKPHOS 58 02/27/2021   AST 40 02/27/2021   ALT 42 02/27/2021   PROT 7.4 02/27/2021   ALBUMIN 4.1 02/27/2021   CALCIUM 9.4 02/27/2021   ANIONGAP 8 02/27/2021   EGFR 45 (L) 12/17/2020   Lab Results  Component Value Date   CHOL 112 12/17/2020   Lab Results  Component Value Date   HDL 33 (L) 12/17/2020   Lab Results  Component Value Date   LDLCALC 50 12/17/2020   Lab Results  Component Value Date   TRIG 175 (H) 12/17/2020   Lab Results  Component Value Date   CHOLHDL 3.4 12/17/2020   Lab Results  Component Value Date   HGBA1C 9.2 (A) 01/08/2021      Assessment & Plan:   Problem List Items Addressed This Visit       Cardiovascular and Mediastinum   Essential hypertension    Maintain current blood pressure medication profile      Relevant Medications   lisinopril (ZESTRIL) 2.5 MG tablet     Endocrine   Uncontrolled type 2 diabetes mellitus with hyperglycemia (HCC) - Primary    Care per endocrinology no changes made refills given      Relevant Medications   lisinopril (ZESTRIL) 2.5 MG tablet   Hyperlipidemia associated with type 2 diabetes mellitus ([place])    Continue with cholesterol therapy      Relevant Medications   lisinopril (ZESTRIL) 2.5 MG tablet   Type 2 diabetes mellitus with stage 3b chronic kidney disease, with long-term current use of insulin (HCC)    Renal function stable      Relevant Medications   lisinopril (ZESTRIL) 2.5 MG tablet     Other   Tobacco use       Current smoking consumption amount: Several cigarettes a  day  Dicsussion on advise to quit smoking and smoking impacts: Cardiovascular impact  Patient's willingness to quit: Willing to quit  Methods to quit smoking discussed: Nicotine replacement  Medication management of smoking session drugs discussed: Nicotine replacement  Resources provided:  AVS   Setting quit date not established  Follow-up arranged 2 months   Time spent counseling the patient: 5 minutes       Other Visit Diagnoses     Need for immunization against influenza       Relevant Orders   Flu Vaccine QUAD 34moIM (Fluarix, Fluzone & Alfiuria Quad PF) (Completed)       Meds ordered this encounter  Medications   lisinopril (ZESTRIL) 2.5 MG tablet    Sig: Take 1 tablet (2.5 mg total) by mouth daily.    Dispense:  90 tablet    Refill:  1   ACCU-CHEK GUIDE test strip    Sig: USE TO CHECK BLOOD SUGAR 2 TIMES DAILY    Dispense:  100 strip    Refill:  2    Needs this filled today   Accu-Chek Softclix Lancets lancets    Sig: Use to check blood sugar TWICE DAILY.    Dispense:  100 each    Refill:  2    Needs filled today   gabapentin (NEURONTIN) 100 MG capsule    Sig: TAKE 1 CAPSULE (100 MG TOTAL) BY MOUTH 2 (  TWO) TIMES DAILY.    Dispense:  180 capsule    Refill:  1   BD PEN NEEDLE NANO U/F 32G X 4 MM MISC    Sig: USE 4 TIMES DAILY AS DIRECTED    Dispense:  200 each    Refill:  3    For future refill    Follow-up: Return in about 3 months (around 06/18/2021).    Asencion Noble, MD

## 2021-03-22 ENCOUNTER — Ambulatory Visit (HOSPITAL_BASED_OUTPATIENT_CLINIC_OR_DEPARTMENT_OTHER): Payer: Medicare HMO

## 2021-03-22 DIAGNOSIS — Z0000 Encounter for general adult medical examination without abnormal findings: Secondary | ICD-10-CM | POA: Diagnosis not present

## 2021-03-29 ENCOUNTER — Other Ambulatory Visit: Payer: Self-pay | Admitting: Critical Care Medicine

## 2021-03-29 NOTE — Telephone Encounter (Signed)
Requested Prescriptions  Pending Prescriptions Disp Refills  . NOVOLOG FLEXPEN 100 UNIT/ML FlexPen [Pharmacy Med Name: NOVOLOG FLEXPEN 100 UNIT/ML Solution Pen-injector] 60 mL 0    Sig: INJECT 38 UNITS INTO THE SKIN AT BREAKFAST, 38 UNITS AT LUNCH AND 24 UNITS AT DINNER     Endocrinology:  Diabetes - Insulins Failed - 03/29/2021  3:46 AM      Failed - HBA1C is between 0 and 7.9 and within 180 days    Hemoglobin A1C  Date Value Ref Range Status  01/08/2021 9.2 (A) 4.0 - 5.6 % Final   HbA1c, POC (controlled diabetic range)  Date Value Ref Range Status  10/15/2020 8.5 (A) 0.0 - 7.0 % Final         Passed - Valid encounter within last 6 months    Recent Outpatient Visits          1 week ago Uncontrolled type 2 diabetes mellitus with hyperglycemia [facility])   [place] Elsie Stain, MD   3 weeks ago Uncontrolled type 2 diabetes mellitus with hyperglycemia [facility])   [place], Annie Main L, RPH-CPP   1 month ago Uncontrolled type 2 diabetes mellitus with hyperglycemia [facility])   [place], Jarome Matin, RPH-CPP   2 months ago Uncontrolled type 2 diabetes mellitus with hyperglycemia [facility])   [place], Jarome Matin, RPH-CPP   3 months ago Uncontrolled type 2 diabetes mellitus with hyperglycemia [facility])   [place] Elsie Stain, MD      Future Appointments            In 2 months Joya Gaskins Burnett Harry, MD [place]

## 2021-04-01 ENCOUNTER — Encounter: Payer: Self-pay | Admitting: Podiatry

## 2021-04-01 ENCOUNTER — Other Ambulatory Visit: Payer: Self-pay

## 2021-04-01 ENCOUNTER — Ambulatory Visit (INDEPENDENT_AMBULATORY_CARE_PROVIDER_SITE_OTHER): Payer: Medicare HMO | Admitting: Podiatry

## 2021-04-01 DIAGNOSIS — E1122 Type 2 diabetes mellitus with diabetic chronic kidney disease: Secondary | ICD-10-CM | POA: Diagnosis not present

## 2021-04-01 DIAGNOSIS — M79675 Pain in left toe(s): Secondary | ICD-10-CM | POA: Diagnosis not present

## 2021-04-01 DIAGNOSIS — Z794 Long term (current) use of insulin: Secondary | ICD-10-CM | POA: Diagnosis not present

## 2021-04-01 DIAGNOSIS — M79674 Pain in right toe(s): Secondary | ICD-10-CM

## 2021-04-01 DIAGNOSIS — B351 Tinea unguium: Secondary | ICD-10-CM | POA: Diagnosis not present

## 2021-04-01 DIAGNOSIS — N1831 Chronic kidney disease, stage 3a: Secondary | ICD-10-CM

## 2021-04-01 DIAGNOSIS — N1832 Chronic kidney disease, stage 3b: Secondary | ICD-10-CM

## 2021-04-01 NOTE — Progress Notes (Signed)
This patient presents  to my office for at risk foot care.  This patient requires this care by a professional since this patient will be at risk due to having CKD and CVA and diabetes with kidney disease.  This patient presents to the office for diabetic foot exam and nail care.  This patient is unable to cut nails himself since the patient cannot reach his nails.These nails are painful walking and wearing shoes.  This patient presents for at risk foot care today.  General Appearance  Alert, conversant and in no acute stress.  Vascular  Dorsalis pedis pulses are palpable  bilaterally. Posterior tibial pulses are absent  B/L. Capillary return is within normal limits  bilaterally. Temperature is within normal limits  bilaterally.  Neurologic  Senn-Weinstein monofilament wire test within normal limits  bilaterally. Muscle power within normal limits bilaterally.  Nails Thick disfigured discolored nails with subungual debris  from hallux to fifth toes bilaterally. No evidence of bacterial infection or drainage bilaterally.  Orthopedic  No limitations of motion  feet .  No crepitus or effusions noted.  No bony pathology or digital deformities noted. Mild  HAV  B/L.Muscle power and ROM  WNL.  Skin  normotropic skin with no porokeratosis noted bilaterally.  No signs of infections or ulcers noted.     Onychomycosis  Pain in right toes  Pain in left toes  Consent was obtained for treatment procedures.   Mechanical debridement of nails 1-5  bilaterally performed with a nail nipper.  Filed with dremel without incident.    Return office visit    1 year.                 Told patient to return for periodic foot care and evaluation due to potential at risk complications.   Gardiner Barefoot DPM

## 2021-04-16 ENCOUNTER — Other Ambulatory Visit: Payer: Self-pay | Admitting: Critical Care Medicine

## 2021-04-17 NOTE — Telephone Encounter (Signed)
Rx transmission did not go through to [employer].   I resent it.

## 2021-04-25 ENCOUNTER — Emergency Department (HOSPITAL_COMMUNITY)
Admission: EM | Admit: 2021-04-25 | Discharge: 2021-04-26 | Disposition: A | Discharge status: Home / Self Care | Payer: Medicare HMO | Attending: Emergency Medicine | Admitting: Emergency Medicine

## 2021-04-25 ENCOUNTER — Other Ambulatory Visit: Payer: Self-pay

## 2021-04-25 DIAGNOSIS — Z7984 Long term (current) use of oral hypoglycemic drugs: Secondary | ICD-10-CM | POA: Insufficient documentation

## 2021-04-25 DIAGNOSIS — E876 Hypokalemia: Secondary | ICD-10-CM | POA: Insufficient documentation

## 2021-04-25 DIAGNOSIS — E1142 Type 2 diabetes mellitus with diabetic polyneuropathy: Secondary | ICD-10-CM | POA: Insufficient documentation

## 2021-04-25 DIAGNOSIS — F17200 Nicotine dependence, unspecified, uncomplicated: Secondary | ICD-10-CM | POA: Insufficient documentation

## 2021-04-25 DIAGNOSIS — E1122 Type 2 diabetes mellitus with diabetic chronic kidney disease: Secondary | ICD-10-CM | POA: Insufficient documentation

## 2021-04-25 DIAGNOSIS — I10 Essential (primary) hypertension: Secondary | ICD-10-CM | POA: Diagnosis not present

## 2021-04-25 DIAGNOSIS — Z7982 Long term (current) use of aspirin: Secondary | ICD-10-CM | POA: Insufficient documentation

## 2021-04-25 DIAGNOSIS — Z794 Long term (current) use of insulin: Secondary | ICD-10-CM | POA: Insufficient documentation

## 2021-04-25 DIAGNOSIS — Z79899 Other long term (current) drug therapy: Secondary | ICD-10-CM | POA: Diagnosis not present

## 2021-04-25 DIAGNOSIS — J449 Chronic obstructive pulmonary disease, unspecified: Secondary | ICD-10-CM | POA: Diagnosis not present

## 2021-04-25 DIAGNOSIS — L0231 Cutaneous abscess of buttock: Secondary | ICD-10-CM | POA: Insufficient documentation

## 2021-04-25 DIAGNOSIS — I129 Hypertensive chronic kidney disease with stage 1 through stage 4 chronic kidney disease, or unspecified chronic kidney disease: Secondary | ICD-10-CM | POA: Insufficient documentation

## 2021-04-25 DIAGNOSIS — N1832 Chronic kidney disease, stage 3b: Secondary | ICD-10-CM | POA: Insufficient documentation

## 2021-04-25 NOTE — ED Triage Notes (Signed)
Pt with boil on buttocks x 2 days. Has grown from pea size to size of baseball in one day. Hx diabetes.

## 2021-04-26 DIAGNOSIS — L0231 Cutaneous abscess of buttock: Secondary | ICD-10-CM | POA: Diagnosis not present

## 2021-04-26 LAB — CBC WITH DIFFERENTIAL/PLATELET
Abs Immature Granulocytes: 0.05 10*3/uL (ref 0.00–0.07)
Basophils Absolute: 0.1 10*3/uL (ref 0.0–0.1)
Basophils Relative: 1 %
Eosinophils Absolute: 0.2 10*3/uL (ref 0.0–0.5)
Eosinophils Relative: 2 %
HCT: 46.9 % (ref 39.0–52.0)
Hemoglobin: 15.3 g/dL (ref 13.0–17.0)
Immature Granulocytes: 0 %
Lymphocytes Relative: 32 %
Lymphs Abs: 4.2 10*3/uL — ABNORMAL HIGH (ref 0.7–4.0)
MCH: 30.2 pg (ref 26.0–34.0)
MCHC: 32.6 g/dL (ref 30.0–36.0)
MCV: 92.7 fL (ref 80.0–100.0)
Monocytes Absolute: 1.5 10*3/uL — ABNORMAL HIGH (ref 0.1–1.0)
Monocytes Relative: 11 %
Neutro Abs: 7.3 10*3/uL (ref 1.7–7.7)
Neutrophils Relative %: 54 %
Platelets: 284 10*3/uL (ref 150–400)
RBC: 5.06 MIL/uL (ref 4.22–5.81)
RDW: 14.8 % (ref 11.5–15.5)
WBC: 13.4 10*3/uL — ABNORMAL HIGH (ref 4.0–10.5)
nRBC: 0 % (ref 0.0–0.2)

## 2021-04-26 LAB — COMPREHENSIVE METABOLIC PANEL
ALT: 27 U/L (ref 0–44)
AST: 25 U/L (ref 15–41)
Albumin: 3.9 g/dL (ref 3.5–5.0)
Alkaline Phosphatase: 51 U/L (ref 38–126)
Anion gap: 11 (ref 5–15)
BUN: 22 mg/dL (ref 8–23)
CO2: 27 mmol/L (ref 22–32)
Calcium: 9.6 mg/dL (ref 8.9–10.3)
Chloride: 99 mmol/L (ref 98–111)
Creatinine, Ser: 1.82 mg/dL — ABNORMAL HIGH (ref 0.61–1.24)
GFR, Estimated: 40 mL/min — ABNORMAL LOW (ref >60)
Glucose, Bld: 216 mg/dL — ABNORMAL HIGH (ref 70–99)
Potassium: 3.3 mmol/L — ABNORMAL LOW (ref 3.5–5.1)
Sodium: 137 mmol/L (ref 135–145)
Total Bilirubin: 0.8 mg/dL (ref 0.3–1.2)
Total Protein: 7.6 g/dL (ref 6.5–8.1)

## 2021-04-26 LAB — LACTIC ACID, PLASMA: Lactic Acid, Venous: 1.4 mmol/L (ref 0.5–1.9)

## 2021-04-26 LAB — CBG MONITORING, ED: Glucose-Capillary: 223 mg/dL — ABNORMAL HIGH (ref 70–99)

## 2021-04-26 MED ORDER — LIDOCAINE HCL (PF) 1 % IJ SOLN
30.0000 mL | Freq: Once | INTRAMUSCULAR | Status: AC
  Administered 2021-04-26: 30 mL
  Filled 2021-04-26: qty 30

## 2021-04-26 MED ORDER — HYDROMORPHONE HCL 1 MG/ML IJ SOLN
1.0000 mg | Freq: Once | INTRAMUSCULAR | Status: AC
  Administered 2021-04-26: 1 mg via INTRAMUSCULAR
  Filled 2021-04-26: qty 1

## 2021-04-26 MED ORDER — SULFAMETHOXAZOLE-TRIMETHOPRIM 800-160 MG PO TABS
1.0000 | ORAL_TABLET | Freq: Two times a day (BID) | ORAL | 0 refills | Status: AC
  Filled 2021-04-28: qty 14, 7d supply, fill #0

## 2021-04-26 MED ORDER — LORAZEPAM 2 MG/ML IJ SOLN
1.0000 mg | Freq: Once | INTRAMUSCULAR | Status: AC
  Administered 2021-04-26: 1 mg via INTRAMUSCULAR
  Filled 2021-04-26: qty 1

## 2021-04-26 MED ORDER — POTASSIUM CHLORIDE CRYS ER 20 MEQ PO TBCR
20.0000 meq | EXTENDED_RELEASE_TABLET | Freq: Every day | ORAL | 0 refills | Status: DC
  Filled 2021-04-28: qty 5, 5d supply, fill #0

## 2021-04-26 NOTE — Discharge Instructions (Addendum)
The abscess on your right buttocks was drained after incision.  It will likely bleed and drain for a few days.  To help recovery, soak in a warm tub once or twice a day.  A small amount of packing, about 5 inches was placed into the wound.  This will help the wound to drain.  Try not to remove the packing for at least 5 days.  If the packing does fall out, do not worry but continue soaking until better.  We are prescribing an antibiotic to take to improve healing.  Your potassium was low so we will give you a few days worth of potassium.  See your doctor in a week for a checkup and repeat potassium testing.

## 2021-04-26 NOTE — ED Provider Notes (Signed)
[facility] Provider Note   CSN: [id number] Arrival date & time: 04/25/21  1802     History No chief complaint on file.   [facility] is a 70 y.o. male.  HPI He presents for evaluation of tender spot on his right buttock, present for several days.  He states his blood sugars have been doing well.  He denies fever, chills, nausea or vomiting.  He does not have more trouble than usual while getting around.  He has had a prior stroke and uses a walking roller.  There are no other known active modifying factors    No past medical history on file.  Patient Active Problem List   Diagnosis Date Noted   Pain due to onychomycosis of toenails of both feet 04/01/2021   Type 2 diabetes mellitus with diabetic polyneuropathy, with long-term current use of insulin ([place]) 03/13/2021   Type 2 diabetes mellitus with hyperglycemia, with long-term current use of insulin ([place]) 03/13/2021   Type 2 diabetes mellitus with stage 3b chronic kidney disease, with long-term current use of insulin ([place]) 03/13/2021   BMI 37.0-37.9, adult 12/18/2020   Hyperlipidemia associated with type 2 diabetes mellitus ([place]) 12/17/2020   Right calf pain 08/27/2020   Tobacco use 06/27/2020   History of cocaine abuse ([place]) 05/13/2020   Fatty liver 05/13/2020   Central sleep apnea 05/13/2020   Arthritis:Knees, right shoulder 05/13/2020   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     No family history on file.  Social History   Tobacco Use   Smoking status: Every Day   Smokeless tobacco: Former  Scientific laboratory technician Use: Never used  Substance Use Topics   Alcohol use: Not Currently   Drug use: Not Currently    Home Medications Prior to Admission medications   Medication Sig Start Date End Date Taking? Authorizing Provider  amLODipine (NORVASC) 5 MG tablet TAKE 1 TABLET (5 MG TOTAL) BY MOUTH DAILY. Patient taking differently: Take 5 mg by mouth daily. 02/05/21 02/05/22 Yes Elsie Stain, MD  aspirin 81 MG EC tablet Take 1 tablet (81 mg total) by mouth daily. 08/27/20  Yes Elsie Stain, MD  atorvastatin (LIPITOR) 10 MG tablet TAKE 1 TABLET (10 MG TOTAL) BY MOUTH DAILY. Patient taking differently: Take 10 mg by mouth daily. 02/05/21 02/05/22 Yes Elsie Stain, MD  chlorthalidone (HYGROTON) 25 MG tablet TAKE 1 TABLET (25 MG TOTAL) BY MOUTH DAILY. Patient taking differently: Take 25 mg by mouth daily. 02/05/21 02/05/22 Yes Elsie Stain, MD  dapagliflozin propanediol (FARXIGA) 5 MG TABS tablet Take 1 tablet (5 mg total) by mouth daily before breakfast. 03/12/21  Yes Shamleffer, Melanie Crazier, MD  fenofibrate 54 MG tablet Take 54 mg by mouth daily. 04/15/21  Yes [provider]  gabapentin (NEURONTIN) 100 MG capsule TAKE 1 CAPSULE (100 MG TOTAL) BY MOUTH 2 (TWO) TIMES DAILY. Patient taking differently: Take 100 mg by mouth 2 (two) times daily. 03/19/21 03/19/22  Yes Elsie Stain, MD  lactulose, encephalopathy, (CHRONULAC) 10 GM/15ML SOLN Take 15 mLs (10 g total) by mouth daily. Patient taking differently: Take 10 g by mouth daily as needed (constipation). 12/17/20  Yes Elsie Stain, MD  lisinopril (ZESTRIL) 2.5 MG tablet TAKE 1 TABLET EVERY DAY Patient taking differently: Take 2.5 mg by mouth daily. 04/17/21  Yes Elsie Stain, MD  NOVOLOG FLEXPEN 100 UNIT/ML FlexPen INJECT 38 UNITS INTO THE SKIN AT BREAKFAST, 38 UNITS  AT LUNCH AND 24 UNITS AT [facility] Patient taking differently: Inject 24-38 Units into the skin See admin instructions. INJECT 38 UNITS INTO THE SKIN AT BREAKFAST, 38 UNITS AT LUNCH AND 24 UNITS AT [facility] 03/29/21  Yes Elsie Stain, MD  potassium chloride SA (KLOR-CON) 20 MEQ tablet Take 1 tablet (20 mEq total) by mouth daily. 04/26/21  Yes Daleen Bo, MD  sulfamethoxazole-trimethoprim (BACTRIM DS) 800-160 MG tablet Take 1 tablet by mouth 2 (two) times daily for 7 days. 04/26/21 05/03/21 Yes Daleen Bo, MD  TRESIBA FLEXTOUCH 100 UNIT/ML FlexTouch Pen Inject 70 Units into the skin at bedtime. 03/05/21  Yes Elsie Stain, MD  TRULICITY 0.38 UE/2.8MK SOPN INJECT 0.75MG (1 PEN) SUBCUTANEOUSLY EVERY WEEK Patient taking differently: Inject 0.75 mg into the skin once a week. Monday 01/15/21  Yes Elsie Stain, MD  ACCU-CHEK GUIDE test strip USE TO CHECK BLOOD SUGAR 2 TIMES DAILY 03/19/21 03/19/22  Elsie Stain, MD  Accu-Chek Softclix Lancets lancets Use to check blood sugar TWICE DAILY. 03/19/21   Elsie Stain, MD  BD PEN NEEDLE NANO U/F 32G X 4 MM MISC USE 4 TIMES DAILY AS DIRECTED 03/19/21   Elsie Stain, MD  Blood Glucose Monitoring Suppl (ACCU-CHEK GUIDE) w/Device KIT USE TO TEST BLOOD SUGAR 04/26/20 04/26/21  Elsie Stain, MD  fenofibrate micronized (LOFIBRA) 134 MG capsule Take 1 capsule (134 mg total) by mouth daily before breakfast. Patient not taking: Reported on 04/26/2021 03/12/21 03/07/22  Shamleffer, Melanie Crazier, MD    Allergies    Bee venom and Penicillins  Review of Systems   Review of Systems  All other systems reviewed and are negative.  Physical Exam Updated Vital Signs BP 101/71   Pulse 87   Temp 98.4 F (36.9 C) (Oral)   Resp 19   SpO2 98%   Physical Exam Vitals and nursing note reviewed.  Constitutional:      Appearance: He is well-developed. He is not ill-appearing.  HENT:     Head: Normocephalic and atraumatic.     Right Ear: External  ear normal.     Left Ear: External ear normal.  Eyes:     Conjunctiva/sclera: Conjunctivae normal.     Pupils: Pupils are equal, round, and reactive to light.  Neck:     Trachea: Phonation normal.  Cardiovascular:     Rate and Rhythm: Normal rate.  Pulmonary:     Effort: Pulmonary effort is normal.  Abdominal:     General: There is no distension.     Tenderness: There is no abdominal tenderness.  Musculoskeletal:        General: Normal range of motion.     Cervical back: Normal range of motion and neck supple.  Skin:    General: Skin is warm and dry.     Comments: Subcutaneous abscess mid lower medial right buttock.  This is approximately 3 x 4 cm, fluctuant and tender to palpation.  No associated cellulitis  Neurological:  Mental Status: He is alert and oriented to person, place, and time.     Cranial Nerves: No cranial nerve deficit.     Sensory: No sensory deficit.     Motor: No abnormal muscle tone.     Coordination: Coordination normal.  Psychiatric:        Mood and Affect: Mood normal.        Behavior: Behavior normal.        Thought Content: Thought content normal.        Judgment: Judgment normal.    ED Results / Procedures / Treatments   Labs (all labs ordered are listed, but only abnormal results are displayed) Labs Reviewed  CBC WITH DIFFERENTIAL/PLATELET - Abnormal; Notable for the following components:      Result Value   WBC 13.4 (*)    Lymphs Abs 4.2 (*)    Monocytes Absolute 1.5 (*)    All other components within normal limits  COMPREHENSIVE METABOLIC PANEL - Abnormal; Notable for the following components:   Potassium 3.3 (*)    Glucose, Bld 216 (*)    Creatinine, Ser 1.82 (*)    GFR, Estimated 40 (*)    All other components within normal limits  CBG MONITORING, ED - Abnormal; Notable for the following components:   Glucose-Capillary 223 (*)    All other components within normal limits  LACTIC ACID, PLASMA    EKG None  Radiology No results  found.  Procedures Procedures   Medications Ordered in ED Medications  lidocaine (PF) (XYLOCAINE) 1 % injection 30 mL (30 mLs Infiltration Given 04/26/21 1300)  HYDROmorphone (DILAUDID) injection 1 mg (1 mg Intramuscular Given 04/26/21 1154)  LORazepam (ATIVAN) injection 1 mg (1 mg Intramuscular Given 04/26/21 1154)    ED Course  I have reviewed the triage vital signs and the nursing notes.  Pertinent labs & imaging results that were available during my care of the patient were reviewed by me and considered in my medical decision making (see chart for details).    MDM Rules/Calculators/A&P                            Patient Vitals for the past 24 hrs:  BP Temp Temp src Pulse Resp SpO2  04/26/21 1300 101/71 -- -- 87 19 98 %  04/26/21 1245 116/80 -- -- 88 (!) 21 100 %  04/26/21 1233 -- -- -- 92 (!) 22 98 %  04/26/21 1200 134/87 -- -- 99 16 100 %  04/26/21 1145 (!) 120/91 -- -- 88 (!) 21 99 %  04/26/21 1130 (!) 126/91 -- -- 93 19 100 %  04/26/21 1120 (!) 123/92 -- -- 98 17 100 %  04/26/21 1115 (!) 123/92 -- -- 98 -- 100 %  04/26/21 0858 (!) 106/92 98.4 F (36.9 C) Oral 97 18 97 %  04/26/21 0320 (!) 145/92 98 F (36.7 C) Oral (!) 108 19 100 %  04/25/21 2250 (!) 129/97 -- -- (!) 112 16 100 %    2:06 PM Reevaluation with update and discussion. After initial assessment and treatment, an updated evaluation reveals he is comfortable now has no further complaints.  Findings discussed and questions answered. Daleen Bo   Medical Decision Making:   This patient is presenting for evaluation of buttocks or, which does require a range of treatment options, and is a complaint that involves a moderate risk of morbidity and mortality. The differential diagnoses include abscess, cellulitis, subcutaneous tumor.  I decided to review old records, and in summary elderly male with diabetes, and prior stroke, presenting for evaluation of a tender nodule.  I did not require additional historical  information from anyone.  Clinical Laboratory Tests Ordered, included CBC, Metabolic panel, and lactate . Review indicates normal except white count high, potassium low, glucose high, creatinine high.   Critical Interventions-clinical evaluation, laboratory testing, medication treatment, I&D abscess, by me, observation and reassessment  After These Interventions, the Patient was reevaluated and was found stable for discharge.  Abscess drained.  Nontoxic appearance.  Doubt serious bacterial infection or requirement for hospitalization.  Incidental mild hypokalemia, likely from diuretic use.  Will prescribe oral antibiotics since the abscess is rather large and he has diabetes, and a prescription for prednisone.  He is advised to follow-up with his PCP in a week or so for checkup.  The packing/wick that was placed will likely fall out but if not out in 5 days he is instructed to remove it.  I have encouraged wound soaking, to improve recovery.  CRITICAL CARE-no Performed by: Daleen Bo  Nursing Notes Reviewed/ Care Coordinated Applicable Imaging Reviewed Interpretation of Laboratory Data incorporated into ED treatment  The patient appears reasonably screened and/or stabilized for discharge and I doubt any other medical condition or other [facility] requiring further screening, evaluation, or treatment in the ED at this time prior to discharge.  Plan: Home Medications-continue usual; Home Treatments-wound care with soaking, remove packing in 5 days; return here if the recommended treatment, does not improve the symptoms; Recommended follow up-PCP follow-up 1 week and PRN     Final Clinical Impression(s) / ED Diagnoses Final diagnoses:  Abscess of buttock, right  Hypokalemia    Rx / DC Orders ED Discharge Orders          Ordered    potassium chloride SA (KLOR-CON) 20 MEQ tablet  Daily        04/26/21 1405    sulfamethoxazole-trimethoprim (BACTRIM DS) 800-160 MG tablet  2 times daily         04/26/21 1405             Daleen Bo, MD 04/26/21 1407

## 2021-04-26 NOTE — ED Provider Notes (Signed)
Emergency Medicine Provider Triage Evaluation Note  Sanuel Ladnier , a 70 y.o. male  was evaluated in triage.  Pt complains of boil to right buttocks he states that he first noticed it 2 days ago he states that yesterday it was pea-sized today felt larger. Denies any fevers chills nausea vomiting diarrhea cough congestion.  He is a diabetic.  Review of Systems  Positive: Boil Negative: Fever  Physical Exam  BP (!) 145/92 (BP Location: Right Arm)   Pulse (!) 108   Temp 98 F (36.7 C) (Oral)   Resp 19   SpO2 100%  Gen:   Awake, no distress   Resp:  Normal effort  MSK:   Moves extremities without difficulty  Other:  Tachycardia with a rate of 105-110 Afebrile.  Not ill-appearing. Difficulty assessed patient in triage.  Seems that he has area of fluctuance on the inside of his right buttocks however given triage chair I am unable to fully evaluate this area.  He is not able to sit comfortably in the chair  Medical Decision Making  Medically screening exam initiated at 3:31 AM.  Appropriate orders placed.  [name] was informed that the remainder of the evaluation will be completed by another provider, this initial triage assessment does not replace that evaluation, and the importance of remaining in the ED until their evaluation is complete.  Patient arrived at 9 and half hours ago.  I assessed patient at this time 3:32 AM  Given the prolonged wait times in the ER today and because of his symptoms and tachycardia will obtain basic labs.  He states that he has not used his antihyperglycemic's today.  Some concern for dehydration causing some of his tachycardia.   Pati Gallo [place], [place] 04/26/21 2947    Fatima Blank, MD 04/27/21 1723

## 2021-04-28 ENCOUNTER — Other Ambulatory Visit: Payer: Self-pay

## 2021-05-01 ENCOUNTER — Other Ambulatory Visit: Payer: Self-pay

## 2021-05-02 ENCOUNTER — Other Ambulatory Visit: Payer: Self-pay

## 2021-05-07 ENCOUNTER — Other Ambulatory Visit: Payer: Self-pay | Admitting: *Deleted

## 2021-05-07 DIAGNOSIS — Z87891 Personal history of nicotine dependence: Secondary | ICD-10-CM

## 2021-05-07 DIAGNOSIS — F17210 Nicotine dependence, cigarettes, uncomplicated: Secondary | ICD-10-CM

## 2021-05-14 ENCOUNTER — Encounter: Payer: Medicare HMO | Admitting: Acute Care

## 2021-05-21 ENCOUNTER — Other Ambulatory Visit: Payer: Self-pay | Admitting: Critical Care Medicine

## 2021-05-23 ENCOUNTER — Emergency Department (HOSPITAL_COMMUNITY)
Admission: EM | Admit: 2021-05-23 | Discharge: 2021-05-23 | Disposition: A | Discharge status: Home / Self Care | Payer: Medicare HMO | Attending: Emergency Medicine | Admitting: Emergency Medicine

## 2021-05-23 ENCOUNTER — Encounter (HOSPITAL_COMMUNITY): Payer: Self-pay | Admitting: Emergency Medicine

## 2021-05-23 ENCOUNTER — Emergency Department (HOSPITAL_COMMUNITY): Payer: Medicare HMO

## 2021-05-23 ENCOUNTER — Other Ambulatory Visit: Payer: Self-pay

## 2021-05-23 DIAGNOSIS — R1011 Right upper quadrant pain: Secondary | ICD-10-CM | POA: Diagnosis not present

## 2021-05-23 DIAGNOSIS — J449 Chronic obstructive pulmonary disease, unspecified: Secondary | ICD-10-CM | POA: Insufficient documentation

## 2021-05-23 DIAGNOSIS — N1832 Chronic kidney disease, stage 3b: Secondary | ICD-10-CM | POA: Diagnosis not present

## 2021-05-23 DIAGNOSIS — R079 Chest pain, unspecified: Secondary | ICD-10-CM | POA: Diagnosis not present

## 2021-05-23 DIAGNOSIS — E1142 Type 2 diabetes mellitus with diabetic polyneuropathy: Secondary | ICD-10-CM | POA: Diagnosis not present

## 2021-05-23 DIAGNOSIS — R1084 Generalized abdominal pain: Secondary | ICD-10-CM | POA: Diagnosis not present

## 2021-05-23 DIAGNOSIS — E1122 Type 2 diabetes mellitus with diabetic chronic kidney disease: Secondary | ICD-10-CM | POA: Insufficient documentation

## 2021-05-23 DIAGNOSIS — I10 Essential (primary) hypertension: Secondary | ICD-10-CM | POA: Diagnosis not present

## 2021-05-23 DIAGNOSIS — Z79899 Other long term (current) drug therapy: Secondary | ICD-10-CM | POA: Diagnosis not present

## 2021-05-23 DIAGNOSIS — Z87891 Personal history of nicotine dependence: Secondary | ICD-10-CM | POA: Insufficient documentation

## 2021-05-23 DIAGNOSIS — Z7984 Long term (current) use of oral hypoglycemic drugs: Secondary | ICD-10-CM | POA: Insufficient documentation

## 2021-05-23 DIAGNOSIS — I129 Hypertensive chronic kidney disease with stage 1 through stage 4 chronic kidney disease, or unspecified chronic kidney disease: Secondary | ICD-10-CM | POA: Insufficient documentation

## 2021-05-23 DIAGNOSIS — Z794 Long term (current) use of insulin: Secondary | ICD-10-CM | POA: Diagnosis not present

## 2021-05-23 DIAGNOSIS — Z7982 Long term (current) use of aspirin: Secondary | ICD-10-CM | POA: Diagnosis not present

## 2021-05-23 DIAGNOSIS — R109 Unspecified abdominal pain: Secondary | ICD-10-CM | POA: Diagnosis present

## 2021-05-23 DIAGNOSIS — R739 Hyperglycemia, unspecified: Secondary | ICD-10-CM | POA: Diagnosis not present

## 2021-05-23 HISTORY — DX: Essential (primary) hypertension: I10

## 2021-05-23 HISTORY — DX: Type 2 diabetes mellitus without complications: E11.9

## 2021-05-23 HISTORY — DX: Cerebral infarction, unspecified: I63.9

## 2021-05-23 LAB — COMPREHENSIVE METABOLIC PANEL
ALT: 25 U/L (ref 0–44)
AST: 40 U/L (ref 15–41)
Albumin: 3.6 g/dL (ref 3.5–5.0)
Alkaline Phosphatase: 51 U/L (ref 38–126)
Anion gap: 8 (ref 5–15)
BUN: 21 mg/dL (ref 8–23)
CO2: 27 mmol/L (ref 22–32)
Calcium: 9.2 mg/dL (ref 8.9–10.3)
Chloride: 99 mmol/L (ref 98–111)
Creatinine, Ser: 1.59 mg/dL — ABNORMAL HIGH (ref 0.61–1.24)
GFR, Estimated: 47 mL/min — ABNORMAL LOW (ref >60)
Glucose, Bld: 216 mg/dL — ABNORMAL HIGH (ref 70–99)
Potassium: 4.2 mmol/L (ref 3.5–5.1)
Sodium: 134 mmol/L — ABNORMAL LOW (ref 135–145)
Total Bilirubin: 1.1 mg/dL (ref 0.3–1.2)
Total Protein: 6.4 g/dL — ABNORMAL LOW (ref 6.5–8.1)

## 2021-05-23 LAB — CBC WITH DIFFERENTIAL/PLATELET
Abs Immature Granulocytes: 0.02 10*3/uL (ref 0.00–0.07)
Basophils Absolute: 0.1 10*3/uL (ref 0.0–0.1)
Basophils Relative: 1 %
Eosinophils Absolute: 0.5 10*3/uL (ref 0.0–0.5)
Eosinophils Relative: 5 %
HCT: 42.1 % (ref 39.0–52.0)
Hemoglobin: 14.5 g/dL (ref 13.0–17.0)
Immature Granulocytes: 0 %
Lymphocytes Relative: 43 %
Lymphs Abs: 3.8 10*3/uL (ref 0.7–4.0)
MCH: 31.1 pg (ref 26.0–34.0)
MCHC: 34.4 g/dL (ref 30.0–36.0)
MCV: 90.3 fL (ref 80.0–100.0)
Monocytes Absolute: 1 10*3/uL (ref 0.1–1.0)
Monocytes Relative: 11 %
Neutro Abs: 3.5 10*3/uL (ref 1.7–7.7)
Neutrophils Relative %: 40 %
Platelets: 248 10*3/uL (ref 150–400)
RBC: 4.66 MIL/uL (ref 4.22–5.81)
RDW: 14.9 % (ref 11.5–15.5)
WBC: 8.8 10*3/uL (ref 4.0–10.5)
nRBC: 0 % (ref 0.0–0.2)

## 2021-05-23 LAB — TROPONIN I (HIGH SENSITIVITY)
Troponin I (High Sensitivity): 11 ng/L (ref <18)
Troponin I (High Sensitivity): 13 ng/L (ref <18)

## 2021-05-23 LAB — LIPASE, BLOOD: Lipase: 49 U/L (ref 11–51)

## 2021-05-23 MED ORDER — MORPHINE SULFATE (PF) 4 MG/ML IV SOLN
4.0000 mg | Freq: Once | INTRAVENOUS | Status: AC
  Administered 2021-05-23: 4 mg via INTRAVENOUS
  Filled 2021-05-23: qty 1

## 2021-05-23 NOTE — ED Provider Notes (Signed)
[facility] EMERGENCY DEPARTMENT Provider Note   CSN: [id number] Arrival date & time: 05/23/21  1743     History Chief Complaint  Patient presents with   Abdominal Pain    Tristain Daily is a 70 y.o. male.  HPI  Patient is a 70 year old male with a history of diabetes mellitus, hypertension, stroke, who presents to the emergency department due to abdominal pain.  Patient brought in by [facility] due to abdominal pain that started last night.  Patient states that he was lying in bed when it began.  He was initially mild but he woke up this morning it was more severe.  Pain is intermittent.  No modifying factors.  He was given 50 mcg of fentanyl in route by EMS.  Also given 324 mg of ASA in route.  Patient denies any chest pain, shortness of breath, nausea, vomiting, diarrhea, URI symptoms.  Denies a surgical history to his abdomen.     Past Medical History:  Diagnosis Date   Diabetes mellitus without complication ([place])    Hypertension    Stroke [facility])     Patient Active Problem List   Diagnosis Date Noted   Pain due to onychomycosis of toenails of both feet 04/01/2021   Type 2 diabetes mellitus with diabetic polyneuropathy, with long-term current use of insulin ([place]) 03/13/2021   Type 2 diabetes mellitus with hyperglycemia, with long-term current use of insulin ([place]) 03/13/2021   Type 2 diabetes mellitus with stage 3b chronic kidney disease, with long-term current use of insulin ([place]) 03/13/2021   BMI 37.0-37.9, adult 12/18/2020   Hyperlipidemia associated with type 2 diabetes mellitus ([place]) 12/17/2020   Right calf pain 08/27/2020   Tobacco use 06/27/2020   History of cocaine abuse ([place]) 05/13/2020   Fatty liver 05/13/2020   Central sleep apnea 05/13/2020   Arthritis:Knees, right shoulder 05/13/2020   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     No family history on file.  Social History   Tobacco Use   Smoking status: Every Day   Smokeless tobacco: Former  Scientific laboratory technician Use: Never used  Substance Use Topics   Alcohol use: Not Currently   Drug use: Not Currently    Home Medications Prior to Admission medications   Medication Sig Start Date End Date Taking? Authorizing Provider  ACCU-CHEK GUIDE test strip USE TO CHECK BLOOD SUGAR 2 TIMES DAILY 03/19/21 03/19/22  Elsie Stain, MD  Accu-Chek Softclix Lancets lancets Use to check blood sugar TWICE DAILY. 03/19/21   Elsie Stain, MD  amLODipine (NORVASC) 5 MG tablet TAKE 1 TABLET (5 MG TOTAL) BY MOUTH DAILY. Patient taking differently: Take 5 mg by mouth daily. 02/05/21 02/05/22  Elsie Stain, MD  aspirin 81 MG EC tablet Take 1 tablet (81 mg total) by mouth daily. 08/27/20   Elsie Stain, MD  atorvastatin (LIPITOR) 10 MG tablet TAKE 1 TABLET (10 MG TOTAL) BY MOUTH DAILY. Patient taking differently: Take 10 mg by  mouth daily. 02/05/21 02/05/22  Elsie Stain, MD  BD PEN NEEDLE NANO U/F 32G X 4 MM MISC USE 4 TIMES DAILY AS DIRECTED 05/21/21   Elsie Stain, MD  chlorthalidone (HYGROTON) 25 MG tablet TAKE 1 TABLET (25 MG TOTAL) BY MOUTH DAILY. Patient taking differently: Take 25 mg by mouth daily. 02/05/21 02/05/22  Elsie Stain, MD  dapagliflozin propanediol (FARXIGA) 5 MG TABS tablet Take 1 tablet (5 mg total) by mouth daily before breakfast. 03/12/21   Shamleffer, Melanie Crazier, MD  fenofibrate 54 MG tablet Take 54 mg by mouth daily. 04/15/21    [provider]  fenofibrate micronized (LOFIBRA) 134 MG capsule Take 1 capsule (134 mg total) by mouth daily before breakfast. Patient not taking: Reported on 04/26/2021 03/12/21 03/07/22  Shamleffer, Melanie Crazier, MD  gabapentin (NEURONTIN) 100 MG capsule TAKE 1 CAPSULE (100 MG TOTAL) BY MOUTH 2 (TWO) TIMES DAILY. Patient taking differently: Take 100 mg by mouth 2 (two) times daily. 03/19/21 03/19/22  Elsie Stain, MD  lactulose, encephalopathy, (CHRONULAC) 10 GM/15ML SOLN Take 15 mLs (10 g total) by mouth daily. Patient taking differently: Take 10 g by mouth daily as needed (constipation). 12/17/20   Elsie Stain, MD  lisinopril (ZESTRIL) 2.5 MG tablet TAKE 1 TABLET EVERY DAY Patient taking differently: Take 2.5 mg by mouth daily. 04/17/21   Elsie Stain, MD  NOVOLOG FLEXPEN 100 UNIT/ML FlexPen INJECT 38 UNITS INTO THE SKIN AT BREAKFAST, 38 UNITS AT LUNCH AND 24 UNITS AT [facility] Patient taking differently: Inject 24-38 Units into the skin See admin instructions. INJECT 38 UNITS INTO THE SKIN AT BREAKFAST, 38 UNITS AT LUNCH AND 24 UNITS AT [facility] 03/29/21   Elsie Stain, MD  potassium chloride SA (KLOR-CON) 20 MEQ tablet Take 1 tablet (20 mEq total) by mouth daily. 04/26/21   Daleen Bo, MD  TRESIBA FLEXTOUCH 100 UNIT/ML FlexTouch Pen Inject 70 Units into the skin at bedtime. 03/05/21   Elsie Stain, MD  TRULICITY 5.63 SL/3.7DS SOPN INJECT 0.75MG  (1 PEN) SUBCUTANEOUSLY EVERY WEEK Patient taking differently: Inject 0.75 mg into the skin once a week. Monday 01/15/21   Elsie Stain, MD    Allergies    Bee venom and Penicillins  Review of Systems   Review of Systems  All other systems reviewed and are negative. Ten systems reviewed and are negative for acute change, except as noted in the HPI.   Physical Exam Updated Vital Signs BP 103/71   Pulse 82   Temp 97.8 F (36.6 C)   Resp 20   SpO2 94%   Physical Exam Vitals and nursing note reviewed.   Constitutional:      General: He is not in acute distress.    Appearance: Normal appearance. He is well-developed. He is not ill-appearing, toxic-appearing or diaphoretic.  HENT:     Head: Normocephalic and atraumatic.     Right Ear: External ear normal.     Left Ear: External ear normal.     Nose: Nose normal.     Mouth/Throat:     Mouth: Mucous membranes are moist.     Pharynx: Oropharynx is clear. No oropharyngeal exudate or posterior oropharyngeal erythema.  Eyes:     Extraocular Movements: Extraocular movements intact.  Cardiovascular:     Rate and Rhythm: Normal rate and regular rhythm.     Pulses: Normal pulses.     Heart sounds: Normal heart sounds. No murmur heard.   No friction rub. No gallop.  Pulmonary:     Effort: Pulmonary effort is normal. No respiratory distress.     Breath sounds: Normal breath sounds. No stridor. No wheezing, rhonchi or rales.  Abdominal:     General: Abdomen is flat and protuberant.     Palpations: Abdomen is soft.     Tenderness: There is abdominal tenderness in the right upper quadrant. Positive signs include Murphy's sign. Negative signs include McBurney's sign.     Comments: Protuberant abdomen that is soft.  Moderate tenderness noted in the right upper quadrant.  Positive Murphy sign.  Musculoskeletal:        General: Normal range of motion.     Cervical back: Normal range of motion and neck supple. No tenderness.  Skin:    General: Skin is warm and dry.  Neurological:     General: No focal deficit present.     Mental Status: He is alert and oriented to person, place, and time.  Psychiatric:        Mood and Affect: Mood normal.        Behavior: Behavior normal.   ED Results / Procedures / Treatments   Labs (all labs ordered are listed, but only abnormal results are displayed) Labs Reviewed  COMPREHENSIVE METABOLIC PANEL - Abnormal; Notable for the following components:      Result Value   Sodium 134 (*)    Glucose, Bld 216 (*)     Creatinine, Ser 1.59 (*)    Total Protein 6.4 (*)    GFR, Estimated 47 (*)    All other components within normal limits  CBC WITH DIFFERENTIAL/PLATELET  LIPASE, BLOOD  TROPONIN I (HIGH SENSITIVITY)  TROPONIN I (HIGH SENSITIVITY)   EKG EKG Interpretation  Date/Time:  Friday May 23 2021 17:44:49 EDT Ventricular Rate:  88 PR Interval:  166 QRS Duration: 97 QT Interval:  344 QTC Calculation: 417 R Axis:   48 Text Interpretation: Sinus rhythm Atrial premature complex Abnormal R-wave progression, early transition Borderline T wave abnormalities Confirmed by Lavenia Atlas [phone]) on 05/23/2021 6:54:53 PM  Radiology DG Chest Portable 1 View  Result Date: 05/23/2021 CLINICAL DATA:  Chest pain. EXAM: PORTABLE CHEST 1 VIEW COMPARISON:  None. FINDINGS: The heart size and mediastinal contours are within normal limits. Both lungs are clear. The visualized skeletal structures are unremarkable. IMPRESSION: No active disease. Electronically Signed   By: Ronney Asters M.D.   On: 05/23/2021 18:29   US Abdomen Limited RUQ (LIVER/GB)  Result Date: 05/23/2021 CLINICAL DATA:  Right upper quadrant abdominal pain. EXAM: ULTRASOUND ABDOMEN LIMITED RIGHT UPPER QUADRANT COMPARISON:  None. FINDINGS: Gallbladder: No gallstones or wall thickening visualized. No sonographic Murphy sign noted by sonographer. Common bile duct: Diameter: 3 mm. Liver: No focal lesion identified. Increase in parenchymal echogenicity. Questionable nodular liver contour in the left lobe. Portal vein is patent on color Doppler imaging with normal direction of blood flow towards the liver. Other: Small amount of perihepatic free fluid. IMPRESSION: 1. No evidence for cholelithiasis. 2. Increased echogenicity with questionable nodular liver contour. Please correlate clinically for cirrhosis. 3. Trace free fluid in the right upper quadrant. Electronically Signed   By: Ronney Asters M.D.   On: 05/23/2021 18:31    Procedures Procedures    Medications Ordered in ED Medications  morphine 4 MG/ML injection 4 mg (4 mg Intravenous Given 05/23/21 1842)    ED Course  I have reviewed the triage vital signs and the nursing notes.  Pertinent labs & imaging results that were available during  my care of the patient were reviewed by me and considered in my medical decision making (see chart for details).    MDM Rules/Calculators/A&P                          Pt is a 70 y.o. male who presents to the emergency department due to right upper quadrant abdominal pain that started last night.  Symptoms have been intermittent.  Labs: CBC without abnormalities. Lipase within normal limits at 49. CMP with a sodium of 134, glucose of 216, creatinine of 1.59, total protein of 6.4, GFR of 47. Troponin of 11 with a repeat of 13.  Imaging: Chest x-ray shows no active disease. Ultrasound of the right upper quadrant shows no evidence of cholelithiasis.  Increased echogenicity with questionable nodular liver contour.  Recommend correlation clinically for cirrhosis.  Trace free fluid in the right upper quadrant.  I, Rayna Sexton, PA-C, personally reviewed and evaluated these images and lab results as part of my medical decision-making.  Unsure the source of the patient's symptoms.  There was a questionable nodular liver contour on ultrasound.  LFTs all within normal limits.  No elevation in total bilirubin at 1.1.  Doubt cirrhosis at this time.  Kidney function appears to be at patient's baseline.  CBC without leukocytosis.  Patient afebrile and not tachycardic.  Doubt infectious etiology.  Lipase within normal limits at 49.  Doubt pancreatitis.  Given the initial concern for chest pain I obtained a basic cardiac work-up.  This was also reassuring.  Chest x-ray is negative.  Reassuring troponins.  Doubt ACS.  Feel that the patient is stable for discharge at this time and he is agreeable.  Discussed return precautions.  Recommended follow-up with  his PCP.  His questions were answered and he was amicable at the time of discharge.  Note: Portions of this report may have been transcribed using voice recognition software. Every effort was made to ensure accuracy; however, inadvertent computerized transcription errors may be present.   Final Clinical Impression(s) / ED Diagnoses Final diagnoses:  RUQ abdominal pain   Rx / DC Orders ED Discharge Orders     None        Rayna Sexton, PA-C 05/23/21 2200    Lorelle Gibbs, DO 05/24/21 1845

## 2021-05-23 NOTE — Discharge Instructions (Signed)
Please continue to monitor your symptoms closely.  If you develop any new or worsening symptoms please come back to the emergency department.  It was a pleasure to meet you.

## 2021-05-23 NOTE — ED Notes (Signed)
E-signature pad unavailable at time of pt discharge. This RN discussed discharge materials with pt and answered all pt questions. Pt stated understanding of discharge material. ? ?

## 2021-05-23 NOTE — ED Triage Notes (Signed)
Pt BIB [facility] c/o RUQ pain that started last PM. Radiates to mid-epigastric region. Denies CP, SOB, N/V/D. States the pain comes and goes. Hx HTN, DM, stroke with residual R sided weakness. A/o x4. Uses a walker to ambulate. Endorses intermittent nausea. Given 91mcg fentanyl by EMS. Pt took 4 baby aspirin prior to EMS arrival.   EMS VS- 128/80, HR 94, RR 18, 98% RA, CBG 263

## 2021-05-28 ENCOUNTER — Other Ambulatory Visit: Payer: Self-pay

## 2021-05-28 ENCOUNTER — Encounter: Payer: Self-pay | Admitting: Acute Care

## 2021-05-28 ENCOUNTER — Ambulatory Visit (INDEPENDENT_AMBULATORY_CARE_PROVIDER_SITE_OTHER): Payer: Medicare HMO | Admitting: Acute Care

## 2021-05-28 DIAGNOSIS — F17210 Nicotine dependence, cigarettes, uncomplicated: Secondary | ICD-10-CM | POA: Diagnosis not present

## 2021-05-28 NOTE — Progress Notes (Signed)
Virtual Visit via Telephone Note  I connected with Reginald Rodriguez on 05/28/21 at 11:00 AM EST by telephone and verified that I am speaking with the correct person using two identifiers.  Location: Patient: At home Provider: [address], [place], [place], [address]    I discussed the limitations, risks, security and privacy concerns of performing an evaluation and management service by telephone and the availability of in person appointments. I also discussed with the patient that there may be a patient responsible charge related to this service. The patient expressed understanding and agreed to proceed.   Shared Decision Making Visit Lung Cancer Screening Program [phone])   Eligibility: Age 70 y.o. Pack Years Smoking History Calculation 46 pack year smoking history (# packs/per year x # years smoked) Recent History of coughing up blood  no Unexplained weight loss? no ( >Than 15 pounds within the last 6 months ) Prior History Lung / other cancer no (Diagnosis within the last 5 years already requiring surveillance chest CT Scans). Smoking Status Current Smoker Former Smokers: Years since quit: NA  Quit Date: NA  Visit Components: Discussion included one or more decision making aids. yes Discussion included risk/benefits of screening. yes Discussion included potential follow up diagnostic testing for abnormal scans. yes Discussion included meaning and risk of over diagnosis. yes Discussion included meaning and risk of False Positives. yes Discussion included meaning of total radiation exposure. yes  Counseling Included: Importance of adherence to annual lung cancer LDCT screening. yes Impact of comorbidities on ability to participate in the program. yes Ability and willingness to under diagnostic treatment. yes  Smoking Cessation Counseling: Current Smokers:  Discussed importance of smoking cessation. yes Information about tobacco cessation classes and interventions  provided to patient. yes Patient provided with "ticket" for LDCT Scan. yes Symptomatic Patient. no  Counseling NA Diagnosis Code: Tobacco Use Z72.0 Asymptomatic Patient yes  Counseling (Intermediate counseling: > three minutes counseling) [id number] Former Smokers:  Discussed the importance of maintaining cigarette abstinence. yes Diagnosis Code: Personal History of Nicotine Dependence. J03.009 Information about tobacco cessation classes and interventions provided to patient. Yes Patient provided with "ticket" for LDCT Scan. yes Written Order for Lung Cancer Screening with LDCT placed in Epic. Yes (CT Chest Lung Cancer Screening Low Dose W/O CM) [id number] Z12.2-Screening of respiratory organs Z87.891-Personal history of nicotine dependence  I have spent 25 minutes of face to face time with Reginald Rodriguez discussing the risks and benefits of lung cancer screening. We viewed a power point together that explained in detail the above noted topics. We paused at intervals to allow for questions to be asked and answered to ensure understanding.We discussed that the single most powerful action that he can take to decrease his risk of developing lung cancer is to quit smoking. We discussed whether or not he is ready to commit to setting a quit date. We discussed options for tools to aid in quitting smoking including nicotine replacement therapy, non-nicotine medications, support groups, Quit Smart classes, and behavior modification. We discussed that often times setting smaller, more achievable goals, such as eliminating 1 cigarette a day for a week and then 2 cigarettes a day for a week can be helpful in slowly decreasing the number of cigarettes smoked. This allows for a sense of accomplishment as well as providing a clinical benefit. I gave him the " Be Stronger Than Your Excuses" card with contact information for community resources, classes, free nicotine replacement therapy, and access to mobile apps, text  messaging,  and on-line smoking cessation help. I have also given him my card and contact information in the event he needs to contact me. We discussed the time and location of the scan, and that either Doroteo Glassman RN or I will call with the results within 24-48 hours of receiving them. I have offered him  a copy of the power point we viewed  as a resource in the event they need reinforcement of the concepts we discussed today in the office. The patient verbalized understanding of all of  the above and had no further questions upon leaving the office. They have my contact information in the event they have any further questions.  I spent 3 minutes counseling on smoking cessation and the health risks of continued tobacco abuse.  I explained to the patient that there has been a high incidence of coronary artery disease noted on these exams. I explained that this is a non-gated exam therefore degree or severity cannot be determined. This patient is on statin therapy. I have asked the patient to follow-up with their PCP regarding any incidental finding of coronary artery disease and management with diet or medication as their PCP  feels is clinically indicated. The patient verbalized understanding of the above and had no further questions upon completion of the visit.      Magdalen Spatz, NP 05/28/2021

## 2021-05-28 NOTE — Patient Instructions (Signed)
Thank you for participating in the [place] Lung Cancer Screening Program. It was our pleasure to meet you today. We will call you with the results of your scan within the next few days. Your scan will be assigned a Lung RADS category score by the physicians reading the scans.  This Lung RADS score determines follow up scanning.  See below for description of categories, and follow up screening recommendations. We will be in touch to schedule your follow up screening annually or based on recommendations of our providers. We will fax a copy of your scan results to your Primary Care Physician, or the physician who referred you to the program, to ensure they have the results. Please call the office if you have any questions or concerns regarding your scanning experience or results.  Our office number is [phone]. Please speak with Denise Phelps, RN. She is our Lung Cancer Screening RN. If she is unavailable when you call, please have the office staff send her a message. She will return your call at her earliest convenience. Remember, if your scan is normal, we will scan you annually as long as you continue to meet the criteria for the program. (Age 55-77, Current smoker or smoker who has quit within the last 15 years). If you are a smoker, remember, quitting is the single most powerful action that you can take to decrease your risk of lung cancer and other pulmonary, breathing related problems. We know quitting is hard, and we are here to help.  Please let us know if there is anything we can do to help you meet your goal of quitting. If you are a former smoker, congratulations. We are proud of you! Remain smoke free! Remember you can refer friends or family members through the number above.  We will screen them to make sure they meet criteria for the program. Thank you for helping us take better care of you by participating in Lung Screening.  Lung RADS Categories:  Lung RADS 1: no nodules  or definitely non-concerning nodules.  Recommendation is for a repeat annual scan in 12 months.  Lung RADS 2:  nodules that are non-concerning in appearance and behavior with a very low likelihood of becoming an active cancer. Recommendation is for a repeat annual scan in 12 months.  Lung RADS 3: nodules that are probably non-concerning , includes nodules with a low likelihood of becoming an active cancer.  Recommendation is for a 6-month repeat screening scan. Often noted after an upper respiratory illness. We will be in touch to make sure you have no questions, and to schedule your 6-month scan.  Lung RADS 4 A: nodules with concerning findings, recommendation is most often for a follow up scan in 3 months or additional testing based on our provider's assessment of the scan. We will be in touch to make sure you have no questions and to schedule the recommended 3 month follow up scan.  Lung RADS 4 B:  indicates findings that are concerning. We will be in touch with you to schedule additional diagnostic testing based on our provider's  assessment of the scan.   

## 2021-05-29 ENCOUNTER — Ambulatory Visit
Admission: RE | Admit: 2021-05-29 | Discharge: 2021-05-29 | Disposition: A | Discharge status: Home / Self Care | Payer: Medicare HMO | Source: Ambulatory Visit | Attending: Acute Care | Admitting: Acute Care

## 2021-05-29 DIAGNOSIS — I2510 Atherosclerotic heart disease of native coronary artery without angina pectoris: Secondary | ICD-10-CM | POA: Diagnosis not present

## 2021-05-29 DIAGNOSIS — F17210 Nicotine dependence, cigarettes, uncomplicated: Secondary | ICD-10-CM

## 2021-05-29 DIAGNOSIS — J432 Centrilobular emphysema: Secondary | ICD-10-CM | POA: Diagnosis not present

## 2021-05-29 DIAGNOSIS — Z87891 Personal history of nicotine dependence: Secondary | ICD-10-CM

## 2021-05-29 DIAGNOSIS — D3500 Benign neoplasm of unspecified adrenal gland: Secondary | ICD-10-CM | POA: Diagnosis not present

## 2021-06-04 ENCOUNTER — Telehealth: Payer: Self-pay | Admitting: Critical Care Medicine

## 2021-06-04 ENCOUNTER — Other Ambulatory Visit: Payer: Self-pay | Admitting: Critical Care Medicine

## 2021-06-04 ENCOUNTER — Other Ambulatory Visit: Payer: Self-pay

## 2021-06-04 NOTE — Telephone Encounter (Signed)
Pt was advised that when he received a card to call office to get him some pills, gum or patches to help quit smoking/ pt received the card from [place] and would like an Rx for something to help quit smoking / he was advised that this would be free / Pt was advised to do a lung test last week and to call after he received a card / pt is on a fixed income and insurance will only cover part / please advise

## 2021-06-04 NOTE — Telephone Encounter (Signed)
Call returned to patient to obtain more information about his request.  Message left with all back requested to this CM.

## 2021-06-04 NOTE — Telephone Encounter (Signed)
Requested Prescriptions  Pending Prescriptions Disp Refills  . TRULICITY 4.36 GO/7.7CH SOPN [Pharmacy Med Name: TRULICITY 4.03 TC/4.8LY Solution Pen-injector] 2 mL 0    Sig: INJECT 0.75MG  (1 PEN) [place]     Endocrinology:  Diabetes - GLP-1 Receptor Agonists Failed - 06/04/2021  3:55 AM      Failed - HBA1C is between 0 and 7.9 and within 180 days    Hemoglobin A1C  Date Value Ref Range Status  01/08/2021 9.2 (A) 4.0 - 5.6 % Final   HbA1c, POC (controlled diabetic range)  Date Value Ref Range Status  10/15/2020 8.5 (A) 0.0 - 7.0 % Final         Passed - Valid encounter within last 6 months    Recent Outpatient Visits          2 months ago Uncontrolled type 2 diabetes mellitus with hyperglycemia [facility])   [place] Elsie Stain, MD   3 months ago Uncontrolled type 2 diabetes mellitus with hyperglycemia [facility])   [place], Annie Main L, RPH-CPP   3 months ago Uncontrolled type 2 diabetes mellitus with hyperglycemia [facility])   [place], Annie Main L, RPH-CPP   4 months ago Uncontrolled type 2 diabetes mellitus with hyperglycemia [facility])   [place], Annie Main L, RPH-CPP   5 months ago Uncontrolled type 2 diabetes mellitus with hyperglycemia [facility])   [place], MD      Future Appointments            In 1 week Joya Gaskins Burnett Harry, MD [place]

## 2021-06-09 ENCOUNTER — Other Ambulatory Visit: Payer: Self-pay | Admitting: Acute Care

## 2021-06-09 DIAGNOSIS — Z87891 Personal history of nicotine dependence: Secondary | ICD-10-CM

## 2021-06-09 DIAGNOSIS — F17210 Nicotine dependence, cigarettes, uncomplicated: Secondary | ICD-10-CM

## 2021-06-09 DIAGNOSIS — F17200 Nicotine dependence, unspecified, uncomplicated: Secondary | ICD-10-CM

## 2021-06-13 ENCOUNTER — Other Ambulatory Visit: Payer: Self-pay | Admitting: Critical Care Medicine

## 2021-06-15 NOTE — Telephone Encounter (Signed)
Requested Prescriptions  Pending Prescriptions Disp Refills  . NOVOLOG FLEXPEN 100 UNIT/ML FlexPen [Pharmacy Med Name: NOVOLOG FLEXPEN 100 UNIT/ML Solution Pen-injector] 60 mL 0    Sig: INJECT UNDER THE SKIN - 38 UNITS AT BREAKFAST, 38 UNITS AT LUNCH AND 24 UNITS AT DINNER     Endocrinology:  Diabetes - Insulins Failed - 06/13/2021  3:38 AM      Failed - HBA1C is between 0 and 7.9 and within 180 days    Hemoglobin A1C  Date Value Ref Range Status  01/08/2021 9.2 (A) 4.0 - 5.6 % Final   HbA1c, POC (controlled diabetic range)  Date Value Ref Range Status  10/15/2020 8.5 (A) 0.0 - 7.0 % Final         Passed - Valid encounter within last 6 months    Recent Outpatient Visits          2 months ago Uncontrolled type 2 diabetes mellitus with hyperglycemia [facility])   [place] Elsie Stain, MD   3 months ago Uncontrolled type 2 diabetes mellitus with hyperglycemia [facility])   [place], Annie Main L, RPH-CPP   4 months ago Uncontrolled type 2 diabetes mellitus with hyperglycemia [facility])   [place], Annie Main L, RPH-CPP   5 months ago Uncontrolled type 2 diabetes mellitus with hyperglycemia [facility])   [place], Annie Main L, RPH-CPP   6 months ago Uncontrolled type 2 diabetes mellitus with hyperglycemia [facility])   [place], MD      Future Appointments            Tomorrow Elsie Stain, MD [place]

## 2021-06-16 ENCOUNTER — Telehealth: Payer: Self-pay

## 2021-06-16 ENCOUNTER — Telehealth: Payer: Self-pay | Admitting: Critical Care Medicine

## 2021-06-16 ENCOUNTER — Encounter: Payer: Self-pay | Admitting: Critical Care Medicine

## 2021-06-16 ENCOUNTER — Other Ambulatory Visit: Payer: Self-pay

## 2021-06-16 ENCOUNTER — Ambulatory Visit (INDEPENDENT_AMBULATORY_CARE_PROVIDER_SITE_OTHER): Payer: Medicare HMO | Admitting: Internal Medicine

## 2021-06-16 ENCOUNTER — Ambulatory Visit: Payer: Medicare HMO | Attending: Critical Care Medicine | Admitting: Critical Care Medicine

## 2021-06-16 ENCOUNTER — Encounter: Payer: Self-pay | Admitting: Internal Medicine

## 2021-06-16 VITALS — BP 124/85 | HR 99 | Resp 16 | Wt 244.0 lb

## 2021-06-16 VITALS — BP 126/80 | HR 80 | Ht 67.0 in | Wt 244.0 lb

## 2021-06-16 DIAGNOSIS — E785 Hyperlipidemia, unspecified: Secondary | ICD-10-CM | POA: Diagnosis not present

## 2021-06-16 DIAGNOSIS — E1122 Type 2 diabetes mellitus with diabetic chronic kidney disease: Secondary | ICD-10-CM

## 2021-06-16 DIAGNOSIS — I7143 Infrarenal abdominal aortic aneurysm, without rupture: Secondary | ICD-10-CM | POA: Diagnosis not present

## 2021-06-16 DIAGNOSIS — J439 Emphysema, unspecified: Secondary | ICD-10-CM | POA: Diagnosis not present

## 2021-06-16 DIAGNOSIS — Z720 Tobacco use: Secondary | ICD-10-CM

## 2021-06-16 DIAGNOSIS — K760 Fatty (change of) liver, not elsewhere classified: Secondary | ICD-10-CM | POA: Diagnosis not present

## 2021-06-16 DIAGNOSIS — Z6837 Body mass index (BMI) 37.0-37.9, adult: Secondary | ICD-10-CM

## 2021-06-16 DIAGNOSIS — Z23 Encounter for immunization: Secondary | ICD-10-CM

## 2021-06-16 DIAGNOSIS — N1832 Chronic kidney disease, stage 3b: Secondary | ICD-10-CM

## 2021-06-16 DIAGNOSIS — E1142 Type 2 diabetes mellitus with diabetic polyneuropathy: Secondary | ICD-10-CM | POA: Diagnosis not present

## 2021-06-16 DIAGNOSIS — I10 Essential (primary) hypertension: Secondary | ICD-10-CM | POA: Diagnosis not present

## 2021-06-16 DIAGNOSIS — E1165 Type 2 diabetes mellitus with hyperglycemia: Secondary | ICD-10-CM | POA: Diagnosis not present

## 2021-06-16 DIAGNOSIS — J432 Centrilobular emphysema: Secondary | ICD-10-CM | POA: Diagnosis not present

## 2021-06-16 DIAGNOSIS — I6930 Unspecified sequelae of cerebral infarction: Secondary | ICD-10-CM

## 2021-06-16 DIAGNOSIS — Z794 Long term (current) use of insulin: Secondary | ICD-10-CM | POA: Diagnosis not present

## 2021-06-16 DIAGNOSIS — E11628 Type 2 diabetes mellitus with other skin complications: Secondary | ICD-10-CM | POA: Diagnosis not present

## 2021-06-16 DIAGNOSIS — E1169 Type 2 diabetes mellitus with other specified complication: Secondary | ICD-10-CM | POA: Diagnosis not present

## 2021-06-16 DIAGNOSIS — I714 Abdominal aortic aneurysm, without rupture, unspecified: Secondary | ICD-10-CM | POA: Diagnosis not present

## 2021-06-16 LAB — POCT GLYCOSYLATED HEMOGLOBIN (HGB A1C): Hemoglobin A1C: 8.4 % — AB (ref 4.0–5.6)

## 2021-06-16 MED ORDER — DAPAGLIFLOZIN PROPANEDIOL 10 MG PO TABS
10.0000 mg | ORAL_TABLET | Freq: Every day | ORAL | 3 refills | Status: DC
  Filled 2021-06-16: qty 90, 90d supply, fill #0

## 2021-06-16 MED ORDER — ATORVASTATIN CALCIUM 10 MG PO TABS
10.0000 mg | ORAL_TABLET | Freq: Every day | ORAL | 3 refills | Status: DC
  Filled 2021-06-16: qty 90, 90d supply, fill #0

## 2021-06-16 MED ORDER — CHLORTHALIDONE 25 MG PO TABS
25.0000 mg | ORAL_TABLET | Freq: Every day | ORAL | 3 refills | Status: DC
Start: 2021-06-16 — End: 2021-06-27
  Filled 2021-06-16: qty 90, 90d supply, fill #0

## 2021-06-16 MED ORDER — FENOFIBRATE 134 MG PO CAPS
134.0000 mg | ORAL_CAPSULE | Freq: Every day | ORAL | 3 refills | Status: DC
  Filled 2021-06-16: qty 90, 90d supply, fill #0

## 2021-06-16 MED ORDER — LISINOPRIL 2.5 MG PO TABS
2.5000 mg | ORAL_TABLET | Freq: Every day | ORAL | 3 refills | Status: DC
  Filled 2021-06-16 – 2021-08-11 (×2): qty 90, 90d supply, fill #0

## 2021-06-16 MED ORDER — ACCU-CHEK SOFTCLIX LANCETS MISC
2 refills | Status: DC
  Filled 2021-06-16 – 2021-08-11 (×2): qty 100, 50d supply, fill #0
  Filled 2021-09-25: qty 100, 50d supply, fill #1

## 2021-06-16 MED ORDER — NOVOLOG FLEXPEN 100 UNIT/ML ~~LOC~~ SOPN
PEN_INJECTOR | SUBCUTANEOUS | 3 refills | Status: DC
  Filled 2021-06-16: qty 75, 81d supply, fill #0

## 2021-06-16 MED ORDER — LACTULOSE ENCEPHALOPATHY 10 GM/15ML PO SOLN
10.0000 g | Freq: Every day | ORAL | 3 refills | Status: DC | PRN
  Filled 2021-06-16: qty 473, 31d supply, fill #0

## 2021-06-16 MED ORDER — INSULIN PEN NEEDLE 31G X 5 MM MISC
1.0000 | Freq: Four times a day (QID) | 3 refills | Status: DC
  Filled 2021-06-16: qty 200, 50d supply, fill #0

## 2021-06-16 MED ORDER — GABAPENTIN 100 MG PO CAPS
100.0000 mg | ORAL_CAPSULE | Freq: Two times a day (BID) | ORAL | 2 refills | Status: DC
  Filled 2021-06-16 – 2021-09-10 (×2): qty 180, 90d supply, fill #0

## 2021-06-16 MED ORDER — AMLODIPINE BESYLATE 5 MG PO TABS
ORAL_TABLET | Freq: Every day | ORAL | 1 refills | Status: DC
  Filled 2021-06-16: qty 90, 90d supply, fill #0

## 2021-06-16 MED ORDER — TRESIBA FLEXTOUCH 200 UNIT/ML ~~LOC~~ SOPN
70.0000 [IU] | PEN_INJECTOR | Freq: Every day | SUBCUTANEOUS | 3 refills | Status: DC
  Filled 2021-06-16: qty 27, 77d supply, fill #0

## 2021-06-16 MED ORDER — TRULICITY 0.75 MG/0.5ML ~~LOC~~ SOAJ
0.7500 mg | SUBCUTANEOUS | 3 refills | Status: DC
  Filled 2021-06-16: qty 6, 84d supply, fill #0

## 2021-06-16 MED ORDER — ACCU-CHEK GUIDE VI STRP
ORAL_STRIP | 2 refills | Status: DC
Start: 2021-06-16 — End: 2022-04-15
  Filled 2021-06-16 – 2021-08-11 (×2): qty 100, 50d supply, fill #0
  Filled 2021-09-25: qty 100, 50d supply, fill #1

## 2021-06-16 NOTE — Assessment & Plan Note (Signed)
Obtain a cane for the patient

## 2021-06-16 NOTE — Assessment & Plan Note (Signed)
As per endocrinology increased dose of Farxiga, continue Trulicity, continue [place] and NovoLog as prescribed  Follow healthy diet

## 2021-06-16 NOTE — Assessment & Plan Note (Signed)
Lung function stable at this time smoking cessation advised

## 2021-06-16 NOTE — Progress Notes (Signed)
Name: Reginald Rodriguez  MRN/ DOB: [id number], [birth year]   Age/ Sex: 70 y.o., male    PCP: Elsie Stain, MD   Reason for Endocrinology Evaluation: Type 2 Diabetes Mellitus     Date of Initial Endocrinology Visit: 03/12/2021    PATIENT IDENTIFIER: Reginald Rodriguez is a 70 y.o. male with a past medical history of DM, HTN , COPD , Hx of cocaine abuse and Dyslipidemia. The patient presented for initial endocrinology clinic visit on 03/12/2021 for consultative assistance with his diabetes management.     DIABETIC HISTORY:  [name] was diagnosed with DM many years ago. His hemoglobin A1c has ranged from 7.7%, peaking at 9.7% in 2021  He is intolerant to higher doses of Trulicity   On his initial visit to our clinic his A1c was 9.2 % we started farxiga, continued Trulicity and Basal/ Prandial regimen     SUBJECTIVE:   During the last visit (03/12/2021): A1c 9.2% We started [place], continued Trulicity, and MID regimen      Today (06/16/21): Mr. Burmester is here for a follow up on diabetes management.   He checks his sugars blood sugars 2 times daily. The patient has not had hypoglycemic episodes since the last clinic visit.  Dexcom is cost prohibitive  He ran out [place] a few days ago  Denies nausea vomiting, or vomiting     HOME DIABETES REGIMEN: Tresiba 70 units daily at night  Novolog 38 units  with breakfast  and Lunch and 26 units with supper  Trulicity 3.38 mg weekly  Farxiga 5 mg daily       Statin: yes ACE-I/ARB: yes   METER DOWNLOAD SUMMARY: 11/14-11/28/2022 Average Number Tests/Day = 1.9 Overall Mean FS Glucose = 198 Standard Deviation = 53  BG Ranges: Low = 115 High = 318   Hypoglycemic Events/30 Days: BG < 50 = 0 Episodes of symptomatic severe hypoglycemia = 0     DIABETIC COMPLICATIONS: Microvascular complications:  CKD III, neuropathy  Denies: retinopathy  Last eye exam: Completed 2022  Macrovascular complications:  CVA ( Right  hemiparesis )  Denies: CAD, PVD   PAST HISTORY: Past Medical History:  Past Medical History:  Diagnosis Date   Diabetes mellitus without complication ([place])    Hypertension    Stroke [facility])    Past Surgical History: No past surgical history on file.  Social History:  reports that he has been smoking cigarettes. He has a 46.00 pack-year smoking history. He has quit using smokeless tobacco. He reports that he does not currently use alcohol. He reports that he does not currently use drugs. Family History: No family history on file.   HOME MEDICATIONS: Allergies as of 06/16/2021       Reactions   Bee Venom Swelling   Penicillins Rash        Medication List        Accurate as of June 16, 2021  9:05 AM. If you have any questions, ask your nurse or doctor.          Accu-Chek Guide test strip Generic drug: glucose blood USE TO CHECK BLOOD SUGAR 2 TIMES DAILY   Accu-Chek Softclix Lancets lancets Use to check blood sugar TWICE DAILY.   amLODipine 5 MG tablet Commonly known as: NORVASC TAKE 1 TABLET (5 MG TOTAL) BY MOUTH DAILY. What changed: how much to take   aspirin 81 MG EC tablet Take 1 tablet (81 mg total) by mouth daily.   atorvastatin 10 MG tablet Commonly known  as: LIPITOR TAKE 1 TABLET (10 MG TOTAL) BY MOUTH DAILY. What changed: how much to take   BD Pen Needle Nano U/F 32G X 4 MM Misc Generic drug: Insulin Pen Needle USE 4 TIMES DAILY AS DIRECTED   chlorthalidone 25 MG tablet Commonly known as: HYGROTON TAKE 1 TABLET (25 MG TOTAL) BY MOUTH DAILY. What changed: how much to take   Farxiga 5 MG Tabs tablet Generic drug: dapagliflozin propanediol Take 1 tablet (5 mg total) by mouth daily before breakfast.   fenofibrate 54 MG tablet Take 54 mg by mouth daily.   fenofibrate micronized 134 MG capsule Commonly known as: LOFIBRA Take 1 capsule (134 mg total) by mouth daily before breakfast.   gabapentin 100 MG capsule Commonly known as:  NEURONTIN TAKE 1 CAPSULE (100 MG TOTAL) BY MOUTH 2 (TWO) TIMES DAILY. What changed: how much to take   lactulose (encephalopathy) 10 GM/15ML Soln Commonly known as: CHRONULAC Take 15 mLs (10 g total) by mouth daily. What changed:  when to take this reasons to take this   lisinopril 2.5 MG tablet Commonly known as: ZESTRIL TAKE 1 TABLET EVERY DAY   NovoLOG FlexPen 100 UNIT/ML FlexPen Generic drug: insulin aspart INJECT UNDER THE SKIN - 38 UNITS AT BREAKFAST, 38 UNITS AT LUNCH AND 24 UNITS AT DINNER   potassium chloride SA 20 MEQ tablet Commonly known as: KLOR-CON Take 1 tablet (20 mEq total) by mouth daily.   [name] FlexTouch 100 UNIT/ML FlexTouch Pen Generic drug: insulin degludec Inject 70 Units into the skin at bedtime.   Trulicity 6.60 YT/0.1SW Sopn Generic drug: Dulaglutide INJECT 0.75MG  (1 PEN) SUBCUTANEOUSLY EVERY WEEK         ALLERGIES: Allergies  Allergen Reactions   Bee Venom Swelling   Penicillins Rash     REVIEW OF SYSTEMS: A comprehensive ROS was conducted with the patient and is negative except as per HPI and below:  Review of Systems  Gastrointestinal:  Negative for diarrhea, nausea and vomiting.  Neurological:  Positive for tingling.     OBJECTIVE:   VITAL SIGNS: BP 126/80 (BP Location: Left Arm, Patient Position: Sitting, Cuff Size: Small)   Pulse 80   Ht 5\' 7"  (1.702 m)   Wt 244 lb (110.7 kg)   SpO2 95%   BMI 38.22 kg/m    PHYSICAL EXAM:  General: Pt appears well and is in NAD with a walker  Neck: General: Supple without adenopathy or carotid bruits. Thyroid: Thyroid size normal.  No goiter or nodules appreciated.  Lungs: Clear with good BS bilat with no rales, rhonchi, or wheezes  Heart: RRR with normal S1 and S2 and no gallops; no murmurs; no rub  Extremities:  Lower extremities - No pretibial edema. No lesions.  Neuro: MS is good with appropriate affect, pt is alert and Ox3    DM Foot Exam 06/16/2021 The skin of the feet is  ithout sores or ulcerations. The pedal pulses are 1+ on right and 1+ on left. The sensation is intact to a screening 5.07, 10 gram monofilament bilaterally   DATA REVIEWED:  Lab Results  Component Value Date   HGBA1C 9.2 (A) 01/08/2021   HGBA1C 8.5 (A) 10/15/2020   HGBA1C 9.7 (A) 06/27/2020   Lab Results  Component Value Date   LDLCALC 50 12/17/2020   CREATININE 1.59 (H) 05/23/2021    Latest Reference Range & Units 05/23/21 17:55  Sodium 135 - 145 mmol/L 134 (L)  Potassium 3.5 - 5.1 mmol/L 4.2  Chloride 98 -  111 mmol/L 99  CO2 22 - 32 mmol/L 27  Glucose 70 - 99 mg/dL 216 (H)  BUN 8 - 23 mg/dL 21  Creatinine 0.61 - 1.24 mg/dL 1.59 (H)  Calcium 8.9 - 10.3 mg/dL 9.2  Anion gap 5 - 15  8  Alkaline Phosphatase 38 - 126 U/L 51  Albumin 3.5 - 5.0 g/dL 3.6  Lipase 11 - 51 U/L 49  AST 15 - 41 U/L 40  ALT 0 - 44 U/L 25  Total Protein 6.5 - 8.1 g/dL 6.4 (L)  Total Bilirubin 0.3 - 1.2 mg/dL 1.1  GFR, Estimated >60 mL/min 47 (L)  (L): Data is abnormally low (H): Data is abnormally high   ASSESSMENT / PLAN / RECOMMENDATIONS:   1) Type 2 Diabetes Mellitus, Poorly controlled, With Neuropathic, CKD III and macrovascular  complications - Most recent A1c of 8.4  %. Goal A1c < 7.0 %.    - A1c down from 9.2%  - I have praised the pt on improved glycemic control  - Dexcom has been cost prohibitive  - Tends to forget the Trulicity at times  -  Tolerating farxiga, will increase the dose as below , will reduce prandial insulin as below  - He is intolerant to higher doses of Trulicity, apparently was on 1.5 mg at some point but this had to be decreased.   MEDICATIONS: Increase  farxiga to 10 tablet every morning  Continue Trulicity 2.50 mg weekly  Continue Tresiba 70 units once daily  Decrease Novolog 34 units with Breakfast, 34 units with Lunch and 24 units with Supper    EDUCATION / INSTRUCTIONS: BG monitoring instructions: Patient is instructed to check his blood sugars 3 times a  day, before meals. Call [facility] if: BG persistently < 70  I reviewed the Rule of 15 for the treatment of hypoglycemia in detail with the patient. Literature supplied.   2) Diabetic complications:  Eye: Does not have known diabetic retinopathy.  Neuro/ Feet: Does  have known diabetic peripheral neuropathy. Renal: Patient does  have known baseline CKD. He is  on an ACEI/ARB at present.   3)Dyslipidemia :   - LDL at goal at 29 mg/dL , Tg elevated  - NO changes   Continue atorvastatin 10 mg daily  Continue fenofibrate 134 mg        Signed electronically by: Mack Guise, MD  [facility]  [address] [place], [place] [address] Phone: [phone] FAX: [phone]   CC: Elsie Stain, MD [address] [place] [address] Phone: [phone]  Fax: [phone]    Return to Endocrinology clinic as below: Future Appointments  Date Time Provider [place]  06/16/2021  9:10 AM Valery Amedee, Melanie Crazier, MD [facility] None  06/16/2021 10:30 AM Elsie Stain, MD [facility] None

## 2021-06-16 NOTE — Assessment & Plan Note (Signed)
History of fatty liver which would explain the right upper quadrant lobulations in the left lobe of the liver on recent ultrasound

## 2021-06-16 NOTE — Assessment & Plan Note (Signed)
Blood pressure at goal no plans to change medications at this time refill sent to pharmacy

## 2021-06-16 NOTE — Assessment & Plan Note (Signed)
As per aortic aneurysm assessment

## 2021-06-16 NOTE — Telephone Encounter (Signed)
Met with the patient when he was in the clinic this morning. Order was received for cane. He had not preference for DME companies and order was faxed to [place]. Also informed him that [employer] [place] has donated canes available for free if he is interested. Provided him with the address and phone number for [employer].

## 2021-06-16 NOTE — Patient Instructions (Signed)
Pneumonia vaccine was given  Refills on her medications sent to our pharmacy  The prescription for a cane was sent to the homecare company they will contact you  Refills on lactulose for your bowel was sent to our pharmacy  Our clinical social worker will contact you to discuss counseling for alcohol use and also your anxiety  Follow directions per endocrinology on recent changes in your medications  See Lurena Joiner, our clinical pharmacist for your diabetes and follow-up in 3 to 4 weeks  See Dr. Joya Gaskins in 3 months

## 2021-06-16 NOTE — Assessment & Plan Note (Signed)
Continue observation and dual preventive therapy with atorvastatin and fenofibrate

## 2021-06-16 NOTE — Progress Notes (Signed)
Established Patient Office Visit  Subjective:  Patient ID: Reginald Rodriguez, male    DOB: December 31, 1950  Age: 70 y.o. MRN: [id number]  CC:  Chief Complaint  Patient presents with   Diabetes    HPI Reginald Rodriguez presents for diabetes follow-up.  Patient just saw endocrinology earlier and did have A1c 8.4 which is down from prior 9.1 level.  Upon review of the patient's glucose meter he averages fasting sugars in the low 200 range and postprandial in the 300 range.  The patient did not tolerate high-dose Trulicity.  Plan is for the patient to increase his dosing of Farxiga to 10 mg daily and his [name] and NovoLog dosing remains the same.  Patient remains on the same dose of Trulicity  Patient denies any alcohol use.  He is smoking about 6 to 7 cigarettes daily.  He states he is somewhat stressed at times with his condition.  Patient is agreeable to having clinical social work for consultation for behavioral therapy.  Patient maintains some constipation with elevated blood sugars and does use lactulose for this.  Patient did agree to receive the pneumonia vaccine at this visit.  He is already had his flu vaccine. Patient does maintain atorvastatin and fenofibrate.  Patient is requesting a cane to assist with ambulation.  Note patient did have low-dose CT scanning of the chest which was negative for cancer. Past Medical History:  Diagnosis Date   Diabetes mellitus without complication ([place])    Hypertension    Stroke [facility])     History reviewed. No pertinent surgical history.  History reviewed. No pertinent family history.  Social History   Socioeconomic History   Marital status: Single    Spouse name: Not on file   Number of children: Not on file   Years of education: Not on file   Highest education level: Not on file  Occupational History   Not on file  Tobacco Use   Smoking status: Every Day    Packs/day: 1.00    Years: 46.00    Pack years: 46.00    Types: Cigarettes    Smokeless tobacco: Former  Scientific laboratory technician Use: Never used  Substance and Sexual Activity   Alcohol use: Not Currently   Drug use: Not Currently   Sexual activity: Not on file  Other Topics Concern   Not on file  Social History Narrative   Not on file   Social Determinants of Health   Financial Resource Strain: Not on file  Food Insecurity: Not on file  Transportation Needs: Not on file  Physical Activity: Not on file  Stress: Not on file  Social Connections: Not on file  Intimate Partner Violence: Not on file    Outpatient Medications Prior to Visit  Medication Sig Dispense Refill   aspirin 81 MG EC tablet Take 1 tablet (81 mg total) by mouth daily. 100 tablet 1   atorvastatin (LIPITOR) 10 MG tablet Take 1 tablet (10 mg total) by mouth daily. 90 tablet 3   dapagliflozin propanediol (FARXIGA) 10 MG TABS tablet Take 1 tablet (10 mg total) by mouth daily before breakfast. 90 tablet 3   Dulaglutide (TRULICITY) 2.95 MW/4.1LK SOPN Inject 0.75 mg into the skin once a week. 6 mL 3   fenofibrate micronized (LOFIBRA) 134 MG capsule Take 1 capsule (134 mg total) by mouth daily before breakfast. 90 capsule 3   insulin degludec (TRESIBA FLEXTOUCH) 200 UNIT/ML FlexTouch Pen Inject 70 Units into the skin daily in the  afternoon. 45 mL 3   Insulin Pen Needle 31G X 5 MM MISC Use in the morning, at noon, in the evening, and at bedtime. 400 each 3   NOVOLOG FLEXPEN 100 UNIT/ML FlexPen INJECT UNDER THE SKIN - 34 UNITS AT BREAKFAST, 34 UNITS AT LUNCH AND 24 UNITS AT DINNER 90 mL 3   ACCU-CHEK GUIDE test strip USE TO CHECK BLOOD SUGAR 2 TIMES DAILY 100 strip 2   Accu-Chek Softclix Lancets lancets Use to check blood sugar TWICE DAILY. 100 each 2   amLODipine (NORVASC) 5 MG tablet TAKE 1 TABLET (5 MG TOTAL) BY MOUTH DAILY. (Patient taking differently: Take 5 mg by mouth daily.) 90 tablet 1   chlorthalidone (HYGROTON) 25 MG tablet TAKE 1 TABLET (25 MG TOTAL) BY MOUTH DAILY. (Patient taking  differently: Take 25 mg by mouth daily.) 90 tablet 1   gabapentin (NEURONTIN) 100 MG capsule TAKE 1 CAPSULE (100 MG TOTAL) BY MOUTH 2 (TWO) TIMES DAILY. (Patient taking differently: Take 100 mg by mouth 2 (two) times daily.) 180 capsule 1   lactulose, encephalopathy, (CHRONULAC) 10 GM/15ML SOLN Take 15 mLs (10 g total) by mouth daily. (Patient taking differently: Take 10 g by mouth daily as needed (constipation).) 236 mL 1   lisinopril (ZESTRIL) 2.5 MG tablet TAKE 1 TABLET EVERY DAY (Patient taking differently: Take 2.5 mg by mouth daily.) 90 tablet 1   potassium chloride SA (KLOR-CON) 20 MEQ tablet Take 1 tablet (20 mEq total) by mouth daily. 5 tablet 0   No facility-administered medications prior to visit.    Allergies  Allergen Reactions   Bee Venom Swelling   Penicillins Rash    ROS Review of Systems  Constitutional:  Negative for chills, diaphoresis and fever.  HENT:  Negative for congestion, hearing loss, nosebleeds, sore throat and tinnitus.   Eyes:  Negative for photophobia and redness.  Respiratory:  Negative for cough, shortness of breath, wheezing and stridor.   Cardiovascular:  Negative for chest pain, palpitations and leg swelling.  Gastrointestinal:  Negative for abdominal pain, blood in stool, constipation, diarrhea, nausea and vomiting.  Endocrine: Negative for polydipsia.  Genitourinary:  Negative for dysuria, flank pain, frequency, hematuria and urgency.  Musculoskeletal:  Positive for gait problem. Negative for back pain, myalgias and neck pain.  Skin:  Negative for rash.  Allergic/Immunologic: Negative for environmental allergies.  Neurological:  Negative for dizziness, tremors, seizures, weakness and headaches.  Hematological:  Does not bruise/bleed easily.  Psychiatric/Behavioral:  Negative for suicidal ideas. The patient is nervous/anxious.      Objective:    Physical Exam Vitals reviewed.  Constitutional:      Appearance: Normal appearance. He is  well-developed. He is obese. He is not diaphoretic.  HENT:     Head: Normocephalic and atraumatic.     Nose: No nasal deformity, septal deviation, mucosal edema or rhinorrhea.     Right Sinus: No maxillary sinus tenderness or frontal sinus tenderness.     Left Sinus: No maxillary sinus tenderness or frontal sinus tenderness.     Mouth/Throat:     Pharynx: No oropharyngeal exudate.  Eyes:     General: No scleral icterus.    Conjunctiva/sclera: Conjunctivae normal.     Pupils: Pupils are equal, round, and reactive to light.  Neck:     Thyroid: No thyromegaly.     Vascular: No carotid bruit or JVD.     Trachea: Trachea normal. No tracheal tenderness or tracheal deviation.  Cardiovascular:     Rate and  Rhythm: Normal rate and regular rhythm.     Chest Wall: PMI is not displaced.     Pulses: Normal pulses. No decreased pulses.     Heart sounds: Normal heart sounds, S1 normal and S2 normal. Heart sounds not distant. No murmur heard. No systolic murmur is present.  No diastolic murmur is present.    No friction rub. No gallop. No S3 or S4 sounds.  Pulmonary:     Effort: Pulmonary effort is normal. No tachypnea, accessory muscle usage or respiratory distress.     Breath sounds: No stridor. No decreased breath sounds, wheezing, rhonchi or rales.     Comments: Distant breath sounds Chest:     Chest wall: No tenderness.  Abdominal:     General: Bowel sounds are normal. There is no distension.     Palpations: Abdomen is soft. Abdomen is not rigid.     Tenderness: There is no abdominal tenderness. There is no guarding or rebound.  Musculoskeletal:        General: Normal range of motion.     Cervical back: Normal range of motion and neck supple. No edema, erythema or rigidity. No muscular tenderness. Normal range of motion.  Lymphadenopathy:     Head:     Right side of head: No submental or submandibular adenopathy.     Left side of head: No submental or submandibular adenopathy.      Cervical: No cervical adenopathy.  Skin:    General: Skin is warm and dry.     Coloration: Skin is not pale.     Findings: No rash.     Nails: There is no clubbing.  Neurological:     Mental Status: He is alert and oriented to person, place, and time. Mental status is at baseline.     Sensory: No sensory deficit.     Gait: Gait abnormal.     Comments: Gait disturbance right lower extremity which is unchanged from prior exams  Psychiatric:        Speech: Speech normal.        Behavior: Behavior normal.    BP 124/85   Pulse 99   Resp 16   Wt 244 lb (110.7 kg)   SpO2 97%   BMI 38.22 kg/m  Wt Readings from Last 3 Encounters:  06/16/21 244 lb (110.7 kg)  06/16/21 244 lb (110.7 kg)  03/19/21 239 lb 6.4 oz (108.6 kg)     Health Maintenance Due  Topic Date Due   Zoster Vaccines- Shingrix (1 of 2) Never done   COVID-19 Vaccine (5 - Booster for Pfizer series) 03/19/2021    There are no preventive care reminders to display for this patient.  No results found for: TSH Lab Results  Component Value Date   WBC 8.8 05/23/2021   HGB 14.5 05/23/2021   HCT 42.1 05/23/2021   MCV 90.3 05/23/2021   PLT 248 05/23/2021   Lab Results  Component Value Date   NA 134 (L) 05/23/2021   K 4.2 05/23/2021   CO2 27 05/23/2021   GLUCOSE 216 (H) 05/23/2021   BUN 21 05/23/2021   CREATININE 1.59 (H) 05/23/2021   BILITOT 1.1 05/23/2021   ALKPHOS 51 05/23/2021   AST 40 05/23/2021   ALT 25 05/23/2021   PROT 6.4 (L) 05/23/2021   ALBUMIN 3.6 05/23/2021   CALCIUM 9.2 05/23/2021   ANIONGAP 8 05/23/2021   EGFR 45 (L) 12/17/2020   Lab Results  Component Value Date   CHOL 112 12/17/2020  Lab Results  Component Value Date   HDL 33 (L) 12/17/2020   Lab Results  Component Value Date   LDLCALC 50 12/17/2020   Lab Results  Component Value Date   TRIG 175 (H) 12/17/2020   Lab Results  Component Value Date   CHOLHDL 3.4 12/17/2020   Lab Results  Component Value Date   HGBA1C 8.4 (A)  06/16/2021      Assessment & Plan:   Problem List Items Addressed This Visit       Cardiovascular and Mediastinum   Essential hypertension    Blood pressure at goal no plans to change medications at this time refill sent to pharmacy      Relevant Medications   amLODipine (NORVASC) 5 MG tablet   chlorthalidone (HYGROTON) 25 MG tablet   lisinopril (ZESTRIL) 2.5 MG tablet   AAA (abdominal aortic aneurysm)    Continue observation and dual preventive therapy with atorvastatin and fenofibrate      Relevant Medications   amLODipine (NORVASC) 5 MG tablet   chlorthalidone (HYGROTON) 25 MG tablet   lisinopril (ZESTRIL) 2.5 MG tablet     Respiratory   COPD with emphysema (HCC)    Lung function stable at this time smoking cessation advised        Digestive   Fatty liver    History of fatty liver which would explain the right upper quadrant lobulations in the left lobe of the liver on recent ultrasound        Endocrine   Uncontrolled type 2 diabetes mellitus with hyperglycemia ([place])    As per endocrinology increased dose of Farxiga, continue Trulicity, continue Tresiba and NovoLog as prescribed  Follow healthy diet      Relevant Medications   lisinopril (ZESTRIL) 2.5 MG tablet   Onychomycosis of multiple toenails with type 2 diabetes mellitus ([place])    Continue with plan of care per podiatry      Relevant Medications   lisinopril (ZESTRIL) 2.5 MG tablet   Hyperlipidemia associated with type 2 diabetes mellitus ([place])    As per aortic aneurysm assessment      Relevant Medications   amLODipine (NORVASC) 5 MG tablet   chlorthalidone (HYGROTON) 25 MG tablet   lisinopril (ZESTRIL) 2.5 MG tablet   Type 2 diabetes mellitus with hyperglycemia, with long-term current use of insulin (HCC)    As per hyperglycemic type 2 diabetes assessment      Relevant Medications   lisinopril (ZESTRIL) 2.5 MG tablet   Type 2 diabetes mellitus with stage 3b chronic kidney disease, with  long-term current use of insulin (HCC)    Monitor renal function      Relevant Medications   lisinopril (ZESTRIL) 2.5 MG tablet     Other   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 - Primary    Obtain a cane for the patient      Relevant Orders   For home use only DME Other see comment   Tobacco use       Current smoking consumption amount: Several cigarettes a day  Dicsussion on advise to quit smoking and smoking impacts: Cardiovascular impact  Patient's willingness to quit: Willing to quit  Methods to quit smoking discussed: Nicotine replacement  Medication management of smoking session drugs discussed: Nicotine replacement  Resources provided:  AVS   Setting quit date not established  Follow-up arranged 2 months, referral to licensed clinical social work for behavioral therapy given   Time spent counseling the patient: 5  minutes       BMI 37.0-37.9, adult    Dietary counseling given       Meds ordered this encounter  Medications   Accu-Chek Softclix Lancets lancets    Sig: Use to check blood sugar TWICE DAILY.    Dispense:  100 each    Refill:  2    Mail to pt home   ACCU-CHEK GUIDE test strip    Sig: USE TO CHECK BLOOD SUGAR 2 TIMES DAILY    Dispense:  100 strip    Refill:  2    Mail to patient home   amLODipine (NORVASC) 5 MG tablet    Sig: TAKE 1 TABLET (5 MG TOTAL) BY MOUTH DAILY.    Dispense:  90 tablet    Refill:  1   chlorthalidone (HYGROTON) 25 MG tablet    Sig: Take 1 tablet (25 mg total) by mouth daily.    Dispense:  90 tablet    Refill:  3   gabapentin (NEURONTIN) 100 MG capsule    Sig: Take 1 capsule (100 mg total) by mouth 2 (two) times daily.    Dispense:  180 capsule    Refill:  2   lactulose, encephalopathy, (CHRONULAC) 10 GM/15ML SOLN    Sig: Take 15 mLs (10 g total) by mouth daily as needed (constipation).    Dispense:  480 mL    Refill:  3    pls fill today   lisinopril (ZESTRIL) 2.5 MG tablet    Sig:  Take 1 tablet (2.5 mg total) by mouth daily.    Dispense:  90 tablet    Refill:  3  36 minutes spent with taking history and physical performing exam educating patient collaborating with other providers complex medical decision making is high  Follow-up: Return in about 3 months (around 09/16/2021).    Asencion Noble, MD

## 2021-06-16 NOTE — Assessment & Plan Note (Signed)
Continue with plan of care per podiatry

## 2021-06-16 NOTE — Telephone Encounter (Signed)
Prior history of cocaine and alcohol abuse currently not using but does have generalized anxiety disorder please assess and provide counseling  Formally was homeless currently now has housing

## 2021-06-16 NOTE — Assessment & Plan Note (Signed)
Monitor renal function 

## 2021-06-16 NOTE — Assessment & Plan Note (Addendum)
  .   Current smoking consumption amount: Several cigarettes a day  . Dicsussion on advise to quit smoking and smoking impacts: Cardiovascular impact  . Patient's willingness to quit: Willing to quit  . Methods to quit smoking discussed: Nicotine replacement  . Medication management of smoking session drugs discussed: Nicotine replacement  . Resources provided:  AVS   . Setting quit date not established  . Follow-up arranged 2 months, referral to licensed clinical social work for behavioral therapy given   Time spent counseling the patient: 5 minutes

## 2021-06-16 NOTE — Patient Instructions (Addendum)
-   Keep Up the Good Work ! A1c down from 9.2% to 8.4 %  - Increase farxiga 10 tablet every morning  - Continue Trulicity 6.48 mg weekly  - Continue Tresiba 70 units once daily  - Decrease  Novolog 34 units with Breakfast, 34 units with Lunch and 24 units with Supper      HOW TO TREAT LOW BLOOD SUGARS (Blood sugar LESS THAN 70 MG/DL) Please follow the RULE OF 15 for the treatment of hypoglycemia treatment (when your (blood sugars are less than 70 mg/dL)   STEP 1: Take 15 grams of carbohydrates when your blood sugar is low, which includes:  3-4 GLUCOSE TABS  OR 3-4 OZ OF JUICE OR REGULAR SODA OR ONE TUBE OF GLUCOSE GEL    STEP 2: RECHECK blood sugar in 15 MINUTES STEP 3: If your blood sugar is still low at the 15 minute recheck --> then, go back to STEP 1 and treat AGAIN with another 15 grams of carbohydrates.

## 2021-06-16 NOTE — Assessment & Plan Note (Signed)
Dietary counseling given

## 2021-06-16 NOTE — Assessment & Plan Note (Signed)
As per hyperglycemic type 2 diabetes assessment

## 2021-06-17 ENCOUNTER — Other Ambulatory Visit: Payer: Self-pay

## 2021-06-18 ENCOUNTER — Other Ambulatory Visit: Payer: Self-pay

## 2021-06-27 ENCOUNTER — Other Ambulatory Visit: Payer: Self-pay | Admitting: Critical Care Medicine

## 2021-06-27 MED ORDER — FENOFIBRATE 134 MG PO CAPS: 134.0000 mg | ORAL_CAPSULE | Freq: Every day | ORAL | 3 refills | Status: DC

## 2021-06-30 ENCOUNTER — Other Ambulatory Visit: Payer: Self-pay | Admitting: Critical Care Medicine

## 2021-06-30 ENCOUNTER — Other Ambulatory Visit: Payer: Self-pay

## 2021-06-30 NOTE — Telephone Encounter (Signed)
Pharmacy received refill 06/16/21.

## 2021-07-23 ENCOUNTER — Other Ambulatory Visit: Payer: Self-pay | Admitting: Critical Care Medicine

## 2021-07-24 ENCOUNTER — Other Ambulatory Visit: Payer: Self-pay | Admitting: Critical Care Medicine

## 2021-07-24 NOTE — Telephone Encounter (Signed)
Medication Refill - Medication:Dulaglutide (TRULICITY) 3.30 QT/6.2UQ  Has the patient contacted their pharmacy? yes (Agent: If no, request that the patient contact the pharmacy for the refill. If patient does not wish to contact the pharmacy document the reason why and proceed with request.) (Agent: If yes, when and what did the pharmacy advise?)contact pcp  Preferred [facility] (with phone number or street [facility]  Has the patient been seen for an appointment in the last year OR does the patient have an upcoming appointment? yes  Agent: Please be advised that RX refills may take up to 3 business days. We ask that you follow-up with your pharmacy.

## 2021-07-25 MED ORDER — TRULICITY 0.75 MG/0.5ML ~~LOC~~ SOAJ
0.7500 mg | SUBCUTANEOUS | 3 refills | Status: DC
  Filled 2021-07-25: qty 6, 84d supply, fill #0

## 2021-07-25 NOTE — Telephone Encounter (Signed)
Requested medication (s) are due for refill today:   Provider to review  Requested medication (s) are on the active medication list:   Yes  Future visit scheduled:   No Seen by Dr. Joya Gaskins a month ago   Last ordered: 06/16/2021 6 ml, 3 refills  Returned because prescribed by another provider.   Requested Prescriptions  Pending Prescriptions Disp Refills   Dulaglutide (TRULICITY) 0.38 UE/2.8MK SOPN 6 mL 3    Sig: Inject 0.75 mg into the skin once a week.     Endocrinology:  Diabetes - GLP-1 Receptor Agonists Failed - 07/24/2021  2:41 PM      Failed - HBA1C is between 0 and 7.9 and within 180 days    Hemoglobin A1C  Date Value Ref Range Status  06/16/2021 8.4 (A) 4.0 - 5.6 % Final   HbA1c, POC (controlled diabetic range)  Date Value Ref Range Status  10/15/2020 8.5 (A) 0.0 - 7.0 % Final          Passed - Valid encounter within last 6 months    Recent Outpatient Visits           1 month ago Type 2 diabetes mellitus with hyperglycemia, with long-term current use of insulin [facility])   [place] Elsie Stain, MD   4 months ago Uncontrolled type 2 diabetes mellitus with hyperglycemia [facility])   [place] Elsie Stain, MD   4 months ago Uncontrolled type 2 diabetes mellitus with hyperglycemia [facility])   [place], Annie Main L, RPH-CPP   5 months ago Uncontrolled type 2 diabetes mellitus with hyperglycemia [facility])   [place], Annie Main L, RPH-CPP   6 months ago Uncontrolled type 2 diabetes mellitus with hyperglycemia [facility])   [place], RPH-CPP

## 2021-07-25 NOTE — Telephone Encounter (Signed)
Spoke with pt and scheduled appt for 08/06/21 at 8:30

## 2021-07-26 ENCOUNTER — Other Ambulatory Visit: Payer: Self-pay

## 2021-08-01 ENCOUNTER — Other Ambulatory Visit: Payer: Self-pay

## 2021-08-06 ENCOUNTER — Ambulatory Visit: Payer: Medicare HMO | Attending: Critical Care Medicine | Admitting: Clinical

## 2021-08-06 ENCOUNTER — Other Ambulatory Visit: Payer: Self-pay

## 2021-08-06 DIAGNOSIS — F4323 Adjustment disorder with mixed anxiety and depressed mood: Secondary | ICD-10-CM | POA: Diagnosis not present

## 2021-08-11 ENCOUNTER — Other Ambulatory Visit: Payer: Self-pay

## 2021-08-12 ENCOUNTER — Other Ambulatory Visit: Payer: Self-pay

## 2021-08-13 NOTE — BH Specialist Note (Signed)
Integrated Behavioral Health Initial In-Person Visit  MRN: [id number] Name: Reginald Rodriguez  Number of [place] Clinician visits:: 1/6 Session Start time: 8:48am  Session End time: 9:30am Total time:  42  minutes  Types of Service: Individual psychotherapy  Interpretor:No. Interpretor Name and Language: N/A   Warm Hand Off Completed.        Subjective: Reginald Rodriguez is a 71 y.o. male accompanied by  self Patient was referred by Reginald Noble, MD for anxiety. Patient reports the following symptoms/concerns: Reports feeling anxious, worrying, restlessness, irritability, decreased energy, and fidgeting. Reports that he often feels alone. Reports a hx of cocaine use. Reports that her has not used cocaine in several years. Reports that he moved from [place] and away from friends who engaged in substance use. Reports that he frequently stays at home and wants a partner.  Duration of problem: 1+ year; Severity of problem: moderate  Objective: Mood: Anxious and Affect: Appropriate Risk of harm to self or others: No plan to harm self or others  Life Context: Family and Social: Pt receives support from children and grandchildren. School/Work: Pt receives Fish farm manager. Self-Care: Pt has hx of cocaine use. Pt goes to bible study and prays as coping skills. Life Changes: Pt has not used cocaine in several years and often feels alone.   Patient and/or Family's Strengths/Protective Factors: Concrete supports in place (healthy food, safe environments, etc.) and Sense of purpose  Goals Addressed: Patient will: Reduce symptoms of: anxiety Increase knowledge and/or ability of: coping skills  Demonstrate ability to: Increase healthy adjustment to current life circumstances  Progress towards Goals: Ongoing  Interventions: Interventions utilized: Mindfulness or Psychologist, educational, CBT Cognitive Behavioral Therapy, and Supportive Counseling  Standardized Assessments  completed: GAD-7 and PHQ 9 GAD 7 : Generalized Anxiety Score 08/06/2021 06/16/2021 10/15/2020 04/15/2020  Nervous, Anxious, on Edge 3 0 0 0  Control/stop worrying 1 0 0 0  Worry too much - different things 3 0 0 0  Trouble relaxing 0 0 0 0  Restless 2 0 0 0  Easily annoyed or irritable 2 0 0 0  Afraid - awful might happen 0 0 0 3  Total GAD 7 Score 11 0 0 3  Anxiety Difficulty - - Not difficult at all -     Depression screen [facility] 2/9 08/06/2021 06/16/2021 10/15/2020 06/27/2020 04/15/2020  Decreased Interest 0 0 0 0 0  Down, Depressed, Hopeless 1 0 0 0 0  PHQ - 2 Score 1 0 0 0 0  Altered sleeping 0 - 0 - -  Tired, decreased energy 2 - 0 - -  Change in appetite 2 - 0 - -  Feeling bad or failure about yourself  0 - 0 - -  Trouble concentrating 0 - 0 - -  Moving slowly or fidgety/restless 3 - 2 - -  Suicidal thoughts 0 - 0 - -  PHQ-9 Score 8 - 2 - -  Difficult doing work/chores - - Not difficult at all - -    Patient and/or Family Response: Pt receptive to tx. Pt receptive to psychoeducation provided on anxiety and depression. Pt receptive to affirmation provided on pt's progress.  Pt receptive to cognitive restructuring. Pt receptive to establishing healthy coping skills.   Patient Centered Plan: Patient is on the following Treatment Plan(s):  Anxiety  Assessment: Denies SI/HI. Patient currently experiencing anxiety. Pt has a hx of cocaine use and became tearful when discussing how he overcame his addiction. Pt appears to have difficulty  with feeling alone and needing new healthy coping skills.   Patient may benefit from establishing new healthy coping skills and enjoyable activities. LCSWA provided psychoeducation on anxiety and depression. LCSWA provided affirmation on pt's progress. LCSWA utilized Adult nurse. LCSWA provided information on resources in the community to increase activity.  Plan: Follow up with behavioral health clinician on : 09/10/21 Behavioral  recommendations: Increase healthy coping skills. Referral(s): [place] (In Clinic) "From scale of 1-10, how likely are you to follow plan?": 10  Mccabe Gloria C Nancylee Gaines, LCSW

## 2021-08-21 IMAGING — DX DG HAND COMPLETE 3+V*R*
3 series · 3 of 3 positions shown · non-contrast
Comparison: None.

CLINICAL DATA: Right hand pain radiating to elbow

EXAM:
RIGHT HAND - COMPLETE 3+ VIEW

[hand pa]
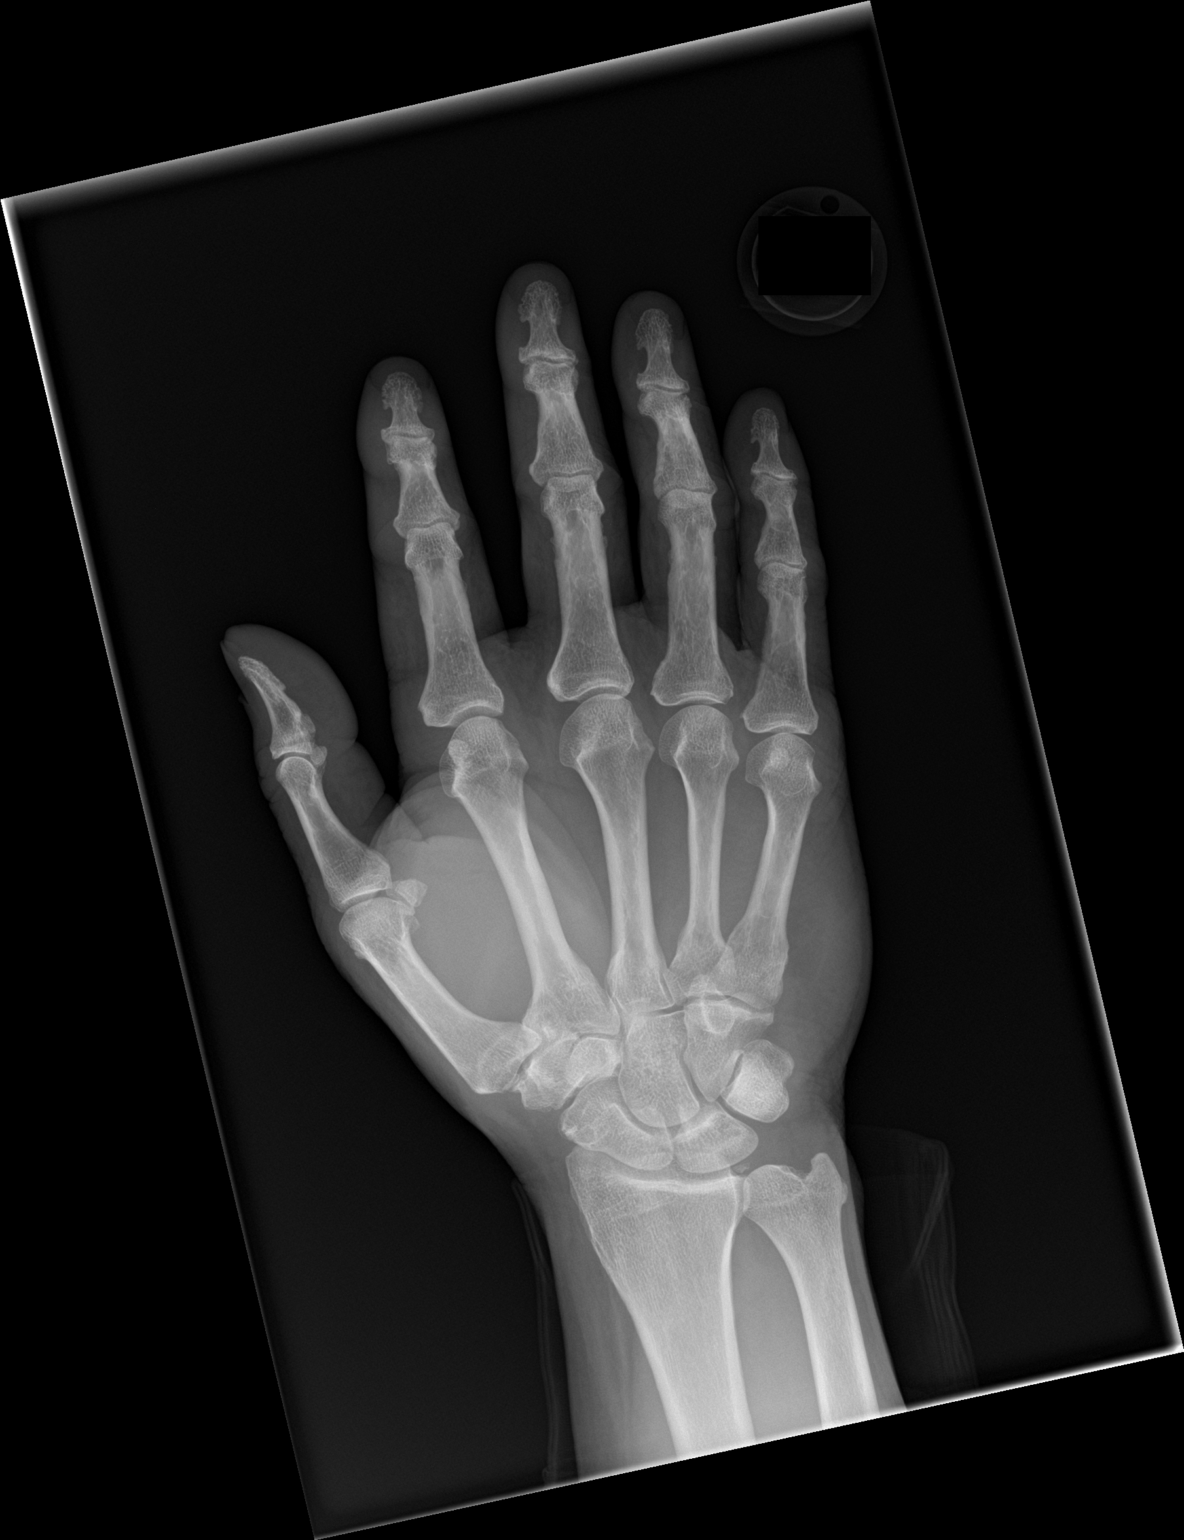

[hand obl]
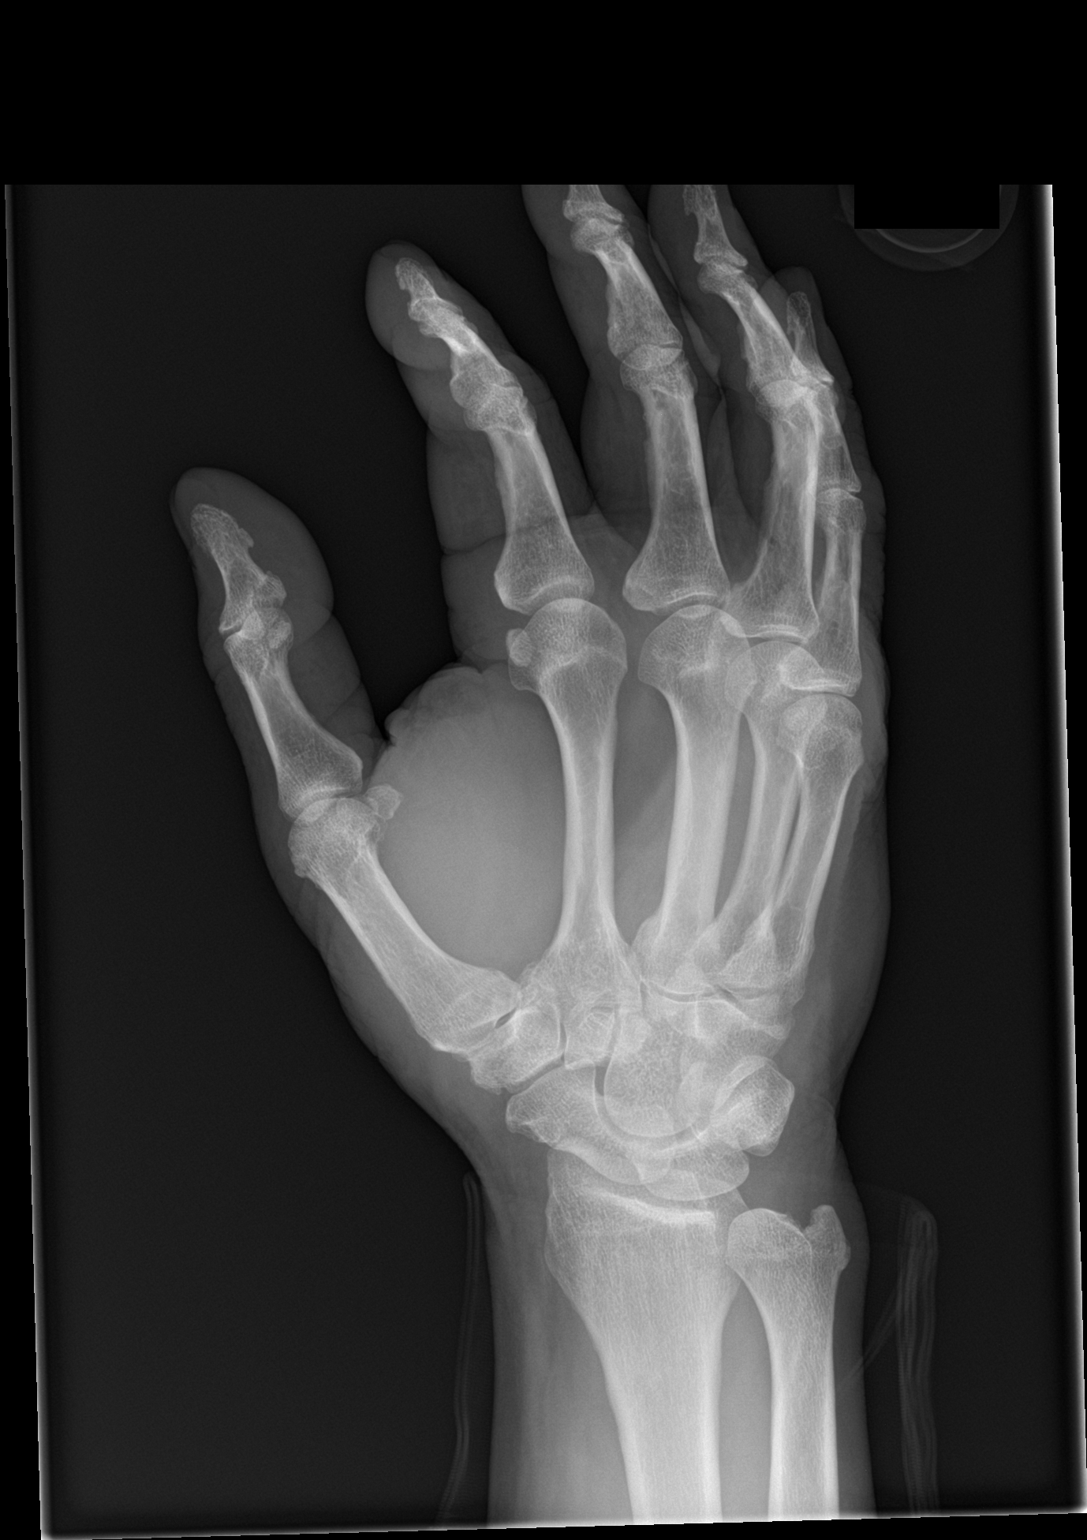

[hand lat]
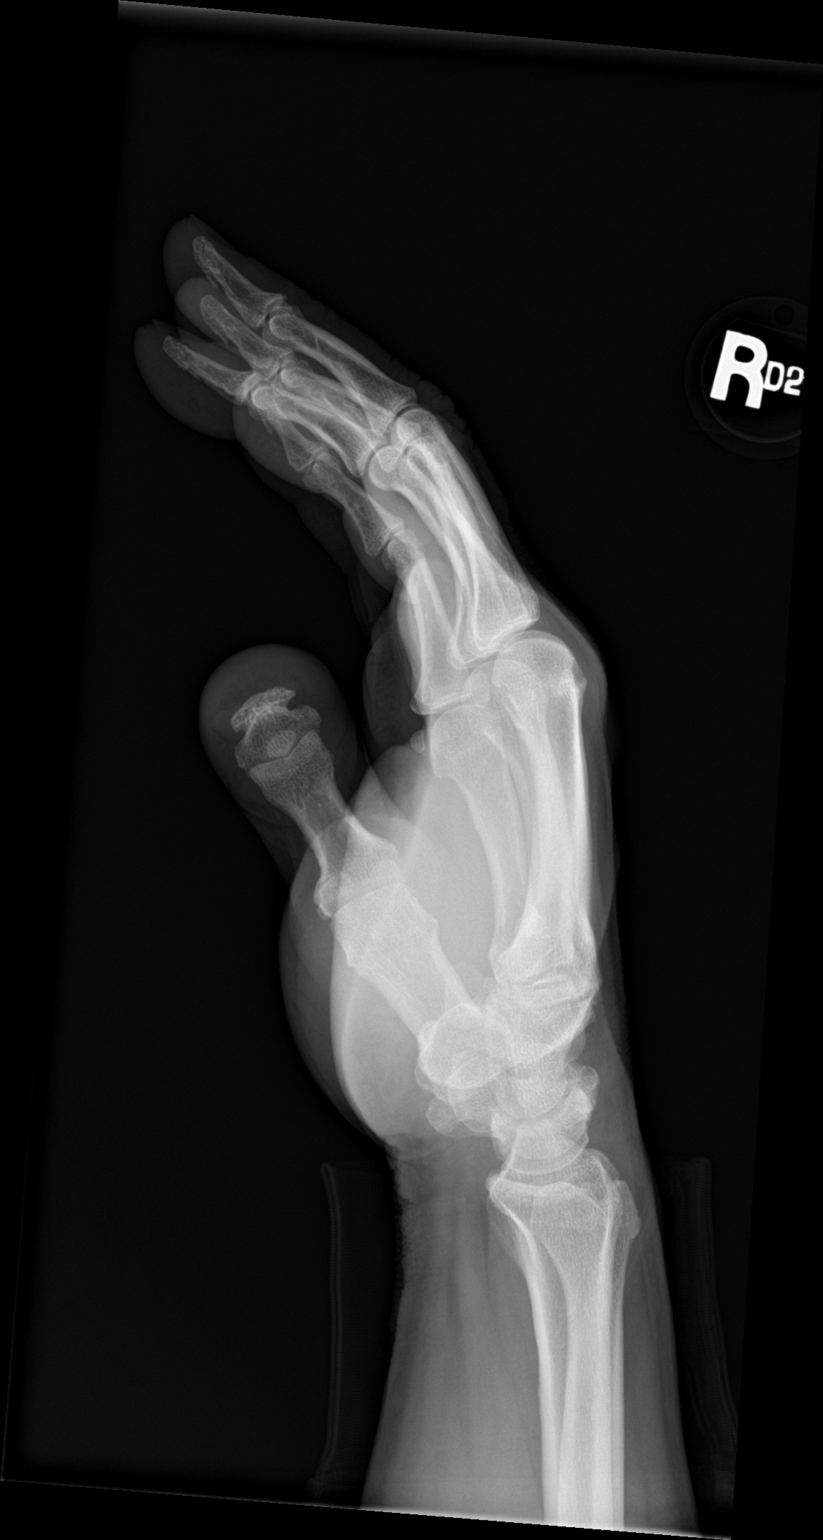

[3 of 3 positions shown; findings below may reference images not displayed]

FINDINGS: Frontal, oblique, and lateral views of the right hand are obtained.
No acute fracture, subluxation, or dislocation. Mild diffuse
interphalangeal joint space narrowing consistent with
osteoarthritis. Soft tissues are unremarkable.
IMPRESSION: 1. Osteoarthritis.  No acute bony abnormality.

## 2021-09-08 ENCOUNTER — Other Ambulatory Visit: Payer: Self-pay | Admitting: Critical Care Medicine

## 2021-09-09 NOTE — Telephone Encounter (Signed)
/  Requested medication (s) are due for refill today: Refills available Requested medication (s) are on the active medication list: yes    Last refill: 06/16/21 #90  3 refills  Future visit scheduled: No  Notes to clinic:Historical provider, please review. Thank you  Requested Prescriptions  Pending Prescriptions Disp Refills   atorvastatin (LIPITOR) 10 MG tablet [Pharmacy Med Name: ATORVASTATIN CALCIUM 10 MG Tablet] 90 tablet 3    Sig: TAKE 1 TABLET EVERY DAY     Cardiovascular:  Antilipid - Statins Failed - 09/08/2021  6:06 AM      Failed - Lipid Panel in normal range within the last 12 months    Cholesterol, Total  Date Value Ref Range Status  12/17/2020 112 100 - 199 mg/dL Final   LDL Chol Calc (NIH)  Date Value Ref Range Status  12/17/2020 50 0 - 99 mg/dL Final   HDL  Date Value Ref Range Status  12/17/2020 33 (L) >39 mg/dL Final   Triglycerides  Date Value Ref Range Status  12/17/2020 175 (H) 0 - 149 mg/dL Final         Passed - Patient is not pregnant      Passed - Valid encounter within last 12 months    Recent Outpatient Visits           2 months ago Type 2 diabetes mellitus with hyperglycemia, with long-term current use of insulin ([place])   [place] Elsie Stain, MD   5 months ago Uncontrolled type 2 diabetes mellitus with hyperglycemia [facility])   [place] Elsie Stain, MD   6 months ago Uncontrolled type 2 diabetes mellitus with hyperglycemia [facility])   [place], Annie Main L, RPH-CPP   7 months ago Uncontrolled type 2 diabetes mellitus with hyperglycemia [facility])   [place], Annie Main L, RPH-CPP   8 months ago Uncontrolled type 2 diabetes mellitus with hyperglycemia [facility])   [place], RPH-CPP

## 2021-09-10 ENCOUNTER — Other Ambulatory Visit: Payer: Self-pay

## 2021-09-10 ENCOUNTER — Ambulatory Visit: Payer: Medicare HMO | Attending: Critical Care Medicine | Admitting: Clinical

## 2021-09-10 DIAGNOSIS — F4323 Adjustment disorder with mixed anxiety and depressed mood: Secondary | ICD-10-CM

## 2021-09-12 NOTE — BH Specialist Note (Signed)
Integrated Behavioral Health Follow Up In-Person Visit  MRN: [id number] Name: Reginald Rodriguez  Number of [place] Clinician visits: 2- Second Visit  Session Start time: [phone]   Session End time: 1191  Total time in minutes: 15   Types of Service: Individual psychotherapy  Interpretor:No. Interpretor Name and Language: N/A  Subjective: Reginald Rodriguez is a 71 y.o. male accompanied by  self Patient was referred by Asencion Noble, MD for anxiety. Patient reports the following symptoms/concerns: Reports that he does not feel anxious or depressed. Reports that he has been spending time outside of the home. Reports that he becoming okay with being at home. Duration of problem: 1+ year; Severity of problem: moderate  Objective: Mood: Euthymic and Affect: Appropriate Risk of harm to self or others: No plan to harm self or others  Life Context: Family and Social: Pt receives support from children and grandchildren. School/Work: Pt receives Fish farm manager. Self-Care: Pt has hx of cocaine use. Pt goes to bible study and prays as coping skills. Life Changes: Pt has not used cocaine in several years and often feels alone.  (No changes to life context)  Patient and/or Family's Strengths/Protective Factors: Concrete supports in place (healthy food, safe environments, etc.)  Goals Addressed: Patient will:  Reduce symptoms of: anxiety   Increase knowledge and/or ability of: coping skills   Demonstrate ability to: Increase healthy adjustment to current life circumstances  Progress towards Goals: Ongoing  Interventions: Interventions utilized:  Mindfulness or Relaxation Training and Supportive Counseling Standardized Assessments completed: Not Needed  Patient and/or Family Response: Pt receptive to continuing to spend time outside of the home.   Patient Centered Plan: Patient is on the following Treatment Plan(s): Anxiety  Assessment: Denies SI/HI. Patient  currently experiencing a hx of anxiety. Pt appears to be adjusting to living alone. Pt still talks to family.   Patient may benefit from continuing to spend time outside of the home. LCSW encouraged pt to fu if needed..  Plan: Follow up with behavioral health clinician on : PRN Behavioral recommendations: Continue spending time outside of the home Referral(s): [place] (In Clinic) "From scale of 1-10, how likely are you to follow plan?": 10  Aki Abalos C Leelah Hanna, LCSW

## 2021-09-25 ENCOUNTER — Other Ambulatory Visit: Payer: Self-pay

## 2021-09-29 NOTE — Progress Notes (Incomplete)
Established Patient Office Visit  Subjective:  Patient ID: Reginald Rodriguez, male    DOB: 02/12/1951  Age: 71 y.o. MRN: [id number]  CC:  No chief complaint on file.   HPI 06/16/21 Reginald Rodriguez presents for diabetes follow-up.  Patient just saw endocrinology earlier and did have A1c 8.4 which is down from prior 9.1 level.  Upon review of the patient's glucose meter he averages fasting sugars in the low 200 range and postprandial in the 300 range.  The patient did not tolerate high-dose Trulicity.  Plan is for the patient to increase his dosing of Farxiga to 10 mg daily and his [name] and NovoLog dosing remains the same.  Patient remains on the same dose of Trulicity  Patient denies any alcohol use.  He is smoking about 6 to 7 cigarettes daily.  He states he is somewhat stressed at times with his condition.  Patient is agreeable to having clinical social work for consultation for behavioral therapy.  Patient maintains some constipation with elevated blood sugars and does use lactulose for this.  Patient did agree to receive the pneumonia vaccine at this visit.  He is already had his flu vaccine. Patient does maintain atorvastatin and fenofibrate.  Patient is requesting a cane to assist with ambulation.  Note patient did have low-dose CT scanning of the chest which was negative for cancer.  09/30/21 Eye Past Medical History:  Diagnosis Date   Diabetes mellitus without complication ([place])    Hypertension    Stroke ([place])     No past surgical history on file.  No family history on file.  Social History   Socioeconomic History   Marital status: Single    Spouse name: Not on file   Number of children: Not on file   Years of education: Not on file   Highest education level: Not on file  Occupational History   Not on file  Tobacco Use   Smoking status: Every Day    Packs/day: 1.00    Years: 46.00    Pack years: 46.00    Types: Cigarettes   Smokeless tobacco: Former   Scientific laboratory technician Use: Never used  Substance and Sexual Activity   Alcohol use: Not Currently   Drug use: Not Currently   Sexual activity: Not on file  Other Topics Concern   Not on file  Social History Narrative   Not on file   Social Determinants of Health   Financial Resource Strain: Not on file  Food Insecurity: Not on file  Transportation Needs: Not on file  Physical Activity: Not on file  Stress: Not on file  Social Connections: Not on file  Intimate Partner Violence: Not on file    Outpatient Medications Prior to Visit  Medication Sig Dispense Refill   ACCU-CHEK GUIDE test strip USE TO CHECK BLOOD SUGAR 2 TIMES DAILY 100 strip 2   Accu-Chek Softclix Lancets lancets Use to check blood sugar TWICE DAILY. 100 each 2   amLODipine (NORVASC) 5 MG tablet TAKE 1 TABLET EVERY DAY 90 tablet 2   aspirin 81 MG EC tablet Take 1 tablet (81 mg total) by mouth daily. 100 tablet 1   atorvastatin (LIPITOR) 10 MG tablet TAKE 1 TABLET EVERY DAY 90 tablet 0   chlorthalidone (HYGROTON) 25 MG tablet TAKE 1 TABLET EVERY DAY 90 tablet 2   dapagliflozin propanediol (FARXIGA) 10 MG TABS tablet Take 1 tablet (10 mg total) by mouth daily before breakfast. 90 tablet 3   Dulaglutide (  TRULICITY) 8.46 NG/2.9BM SOPN Inject 0.75 mg into the skin once a week. 6 mL 3   fenofibrate micronized (LOFIBRA) 134 MG capsule Take 1 capsule (134 mg total) by mouth daily before breakfast. 90 capsule 3   gabapentin (NEURONTIN) 100 MG capsule Take 1 capsule (100 mg total) by mouth 2 (two) times daily. 180 capsule 2   insulin degludec (TRESIBA FLEXTOUCH) 200 UNIT/ML FlexTouch Pen Inject 70 Units into the skin daily in the afternoon. 45 mL 3   Insulin Pen Needle 31G X 5 MM MISC Use in the morning, at noon, in the evening, and at bedtime. 400 each 3   lactulose, encephalopathy, (CHRONULAC) 10 GM/15ML SOLN Take 15 mLs (10 g total) by mouth daily as needed (constipation). 480 mL 3   lisinopril (ZESTRIL)  2.5 MG tablet Take 1 tablet (2.5 mg total) by mouth daily. 90 tablet 3   NOVOLOG FLEXPEN 100 UNIT/ML FlexPen INJECT UNDER THE SKIN - 34 UNITS AT BREAKFAST, 34 UNITS AT LUNCH AND 24 UNITS AT DINNER 90 mL 3   No facility-administered medications prior to visit.    Allergies  Allergen Reactions   Bee Venom Swelling   Penicillins Rash    ROS Review of Systems  Constitutional:  Negative for chills, diaphoresis and fever.  HENT:  Negative for congestion, hearing loss, nosebleeds, sore throat and tinnitus.   Eyes:  Negative for photophobia and redness.  Respiratory:  Negative for cough, shortness of breath, wheezing and stridor.   Cardiovascular:  Negative for chest pain, palpitations and leg swelling.  Gastrointestinal:  Negative for abdominal pain, blood in stool, constipation, diarrhea, nausea and vomiting.  Endocrine: Negative for polydipsia.  Genitourinary:  Negative for dysuria, flank pain, frequency, hematuria and urgency.  Musculoskeletal:  Positive for gait problem. Negative for back pain, myalgias and neck pain.  Skin:  Negative for rash.  Allergic/Immunologic: Negative for environmental allergies.  Neurological:  Negative for dizziness, tremors, seizures, weakness and headaches.  Hematological:  Does not bruise/bleed easily.  Psychiatric/Behavioral:  Negative for suicidal ideas. The patient is nervous/anxious.      Objective:    Physical Exam Vitals reviewed.  Constitutional:      Appearance: Normal appearance. He is well-developed. He is obese. He is not diaphoretic.  HENT:     Head: Normocephalic and atraumatic.     Nose: No nasal deformity, septal deviation, mucosal edema or rhinorrhea.     Right Sinus: No maxillary sinus tenderness or frontal sinus tenderness.     Left Sinus: No maxillary sinus tenderness or frontal sinus tenderness.     Mouth/Throat:     Pharynx: No oropharyngeal exudate.  Eyes:     General: No scleral icterus.    Conjunctiva/sclera:  Conjunctivae normal.     Pupils: Pupils are equal, round, and reactive to light.  Neck:     Thyroid: No thyromegaly.     Vascular: No carotid bruit or JVD.     Trachea: Trachea normal. No tracheal tenderness or tracheal deviation.  Cardiovascular:     Rate and Rhythm: Normal rate and regular rhythm.     Chest Wall: PMI is not displaced.     Pulses: Normal pulses. No decreased pulses.     Heart sounds: Normal heart sounds, S1 normal and S2 normal. Heart sounds not distant. No murmur heard. No systolic murmur is present.  No diastolic murmur is present.    No friction rub. No gallop. No S3 or S4 sounds.  Pulmonary:     Effort: Pulmonary  effort is normal. No tachypnea, accessory muscle usage or respiratory distress.     Breath sounds: No stridor. No decreased breath sounds, wheezing, rhonchi or rales.     Comments: Distant breath sounds Chest:     Chest wall: No tenderness.  Abdominal:     General: Bowel sounds are normal. There is no distension.     Palpations: Abdomen is soft. Abdomen is not rigid.     Tenderness: There is no abdominal tenderness. There is no guarding or rebound.  Musculoskeletal:        General: Normal range of motion.     Cervical back: Normal range of motion and neck supple. No edema, erythema or rigidity. No muscular tenderness. Normal range of motion.  Lymphadenopathy:     Head:     Right side of head: No submental or submandibular adenopathy.     Left side of head: No submental or submandibular adenopathy.     Cervical: No cervical adenopathy.  Skin:    General: Skin is warm and dry.     Coloration: Skin is not pale.     Findings: No rash.     Nails: There is no clubbing.  Neurological:     Mental Status: He is alert and oriented to person, place, and time. Mental status is at baseline.     Sensory: No sensory deficit.     Gait: Gait abnormal.     Comments: Gait disturbance right lower extremity which is unchanged from prior exams  Psychiatric:         Speech: Speech normal.        Behavior: Behavior normal.    There were no vitals taken for this visit. Wt Readings from Last 3 Encounters:  06/16/21 244 lb (110.7 kg)  06/16/21 244 lb (110.7 kg)  03/19/21 239 lb 6.4 oz (108.6 kg)     Health Maintenance Due  Topic Date Due   Zoster Vaccines- Shingrix (1 of 2) Never done   COVID-19 Vaccine (5 - Booster for Pfizer series) 03/19/2021   OPHTHALMOLOGY EXAM  08/07/2021    There are no preventive care reminders to display for this patient.  No results found for: TSH Lab Results  Component Value Date   WBC 8.8 05/23/2021   HGB 14.5 05/23/2021   HCT 42.1 05/23/2021   MCV 90.3 05/23/2021   PLT 248 05/23/2021   Lab Results  Component Value Date   NA 134 (L) 05/23/2021   K 4.2 05/23/2021   CO2 27 05/23/2021   GLUCOSE 216 (H) 05/23/2021   BUN 21 05/23/2021   CREATININE 1.59 (H) 05/23/2021   BILITOT 1.1 05/23/2021   ALKPHOS 51 05/23/2021   AST 40 05/23/2021   ALT 25 05/23/2021   PROT 6.4 (L) 05/23/2021   ALBUMIN 3.6 05/23/2021   CALCIUM 9.2 05/23/2021   ANIONGAP 8 05/23/2021   EGFR 45 (L) 12/17/2020   Lab Results  Component Value Date   CHOL 112 12/17/2020   Lab Results  Component Value Date   HDL 33 (L) 12/17/2020   Lab Results  Component Value Date   LDLCALC 50 12/17/2020   Lab Results  Component Value Date   TRIG 175 (H) 12/17/2020   Lab Results  Component Value Date   CHOLHDL 3.4 12/17/2020   Lab Results  Component Value Date   HGBA1C 8.4 (A) 06/16/2021      Assessment & Plan:   Problem List Items Addressed This Visit   None  No orders of the defined  types were placed in this encounter. 36 minutes spent with taking history and physical performing exam educating patient collaborating with other providers complex medical decision making is high  Follow-up: No follow-ups on file.    Asencion Noble, MD

## 2021-09-30 ENCOUNTER — Ambulatory Visit: Payer: Medicare HMO | Attending: Critical Care Medicine | Admitting: Critical Care Medicine

## 2021-09-30 ENCOUNTER — Encounter: Payer: Self-pay | Admitting: Critical Care Medicine

## 2021-09-30 ENCOUNTER — Other Ambulatory Visit: Payer: Self-pay

## 2021-09-30 VITALS — BP 123/92 | HR 96 | Wt 242.6 lb

## 2021-09-30 DIAGNOSIS — Z6837 Body mass index (BMI) 37.0-37.9, adult: Secondary | ICD-10-CM

## 2021-09-30 DIAGNOSIS — Z794 Long term (current) use of insulin: Secondary | ICD-10-CM | POA: Diagnosis not present

## 2021-09-30 DIAGNOSIS — J432 Centrilobular emphysema: Secondary | ICD-10-CM | POA: Diagnosis not present

## 2021-09-30 DIAGNOSIS — Z720 Tobacco use: Secondary | ICD-10-CM

## 2021-09-30 DIAGNOSIS — E1142 Type 2 diabetes mellitus with diabetic polyneuropathy: Secondary | ICD-10-CM

## 2021-09-30 DIAGNOSIS — E1122 Type 2 diabetes mellitus with diabetic chronic kidney disease: Secondary | ICD-10-CM | POA: Diagnosis not present

## 2021-09-30 DIAGNOSIS — E1165 Type 2 diabetes mellitus with hyperglycemia: Secondary | ICD-10-CM

## 2021-09-30 DIAGNOSIS — I7143 Infrarenal abdominal aortic aneurysm, without rupture: Secondary | ICD-10-CM | POA: Diagnosis not present

## 2021-09-30 DIAGNOSIS — I10 Essential (primary) hypertension: Secondary | ICD-10-CM

## 2021-09-30 DIAGNOSIS — F17210 Nicotine dependence, cigarettes, uncomplicated: Secondary | ICD-10-CM

## 2021-09-30 DIAGNOSIS — N1832 Chronic kidney disease, stage 3b: Secondary | ICD-10-CM

## 2021-09-30 LAB — GLUCOSE, POCT (MANUAL RESULT ENTRY): POC Glucose: 116 mg/dl — AB (ref 70–99)

## 2021-09-30 MED ORDER — DAPAGLIFLOZIN PROPANEDIOL 10 MG PO TABS: 10.0000 mg | ORAL_TABLET | Freq: Every day | ORAL | 3 refills | Status: DC

## 2021-09-30 MED ORDER — GABAPENTIN 100 MG PO CAPS: 100.0000 mg | ORAL_CAPSULE | Freq: Two times a day (BID) | ORAL | 2 refills | Status: DC

## 2021-09-30 MED ORDER — TRULICITY 0.75 MG/0.5ML ~~LOC~~ SOAJ: 0.7500 mg | SUBCUTANEOUS | 3 refills | Status: DC

## 2021-09-30 MED ORDER — TRESIBA FLEXTOUCH 200 UNIT/ML ~~LOC~~ SOPN: 72.0000 [IU] | PEN_INJECTOR | Freq: Every day | SUBCUTANEOUS | 3 refills | Status: DC

## 2021-09-30 MED ORDER — ATORVASTATIN CALCIUM 10 MG PO TABS: 10.0000 mg | ORAL_TABLET | Freq: Every day | ORAL | 3 refills | Status: DC

## 2021-09-30 MED ORDER — LISINOPRIL 2.5 MG PO TABS: 2.5000 mg | ORAL_TABLET | Freq: Every day | ORAL | 3 refills | Status: DC

## 2021-09-30 MED ORDER — NOVOLOG FLEXPEN 100 UNIT/ML ~~LOC~~ SOPN: PEN_INJECTOR | SUBCUTANEOUS | 3 refills | Status: DC

## 2021-09-30 NOTE — Patient Instructions (Signed)
All medications sent to your mail order pharmacy and were refilled including the [place] ? ?Increase the Tresiba to 72 units daily ? ?Focus on stopping smoking using the nicotine gum see attachment ? ?Follow a healthy diet per the handout we gave you ? ?Walk for 30 minutes straight 4 times a week to improve your exercise capacity ? ?Keep your appointment with Dr. Leonette Monarch of endocrine on March 29 ? ?Get an eye exam this year ? ?Return to Dr. Joya Gaskins 3 months ?

## 2021-09-30 NOTE — Assessment & Plan Note (Signed)
Continue gabapentin.

## 2021-09-30 NOTE — Assessment & Plan Note (Signed)
Blood pressure well controlled at this visit refills given we will check metabolic panel ?

## 2021-09-30 NOTE — Assessment & Plan Note (Signed)
Reassess renal function 

## 2021-09-30 NOTE — Assessment & Plan Note (Addendum)
Still not at goal with diabetes will increase Tresiba to 72 units daily and continue NovoLog 3 times daily ? ?Patient given a lifestyle medicine handout and went over dietary measures with the patient ?

## 2021-09-30 NOTE — Assessment & Plan Note (Signed)
Need to work on smoking cessation ?

## 2021-09-30 NOTE — Assessment & Plan Note (Signed)
Continue statin therapy.

## 2021-09-30 NOTE — Assessment & Plan Note (Signed)
? ? ??   Current smoking consumption amount: Several cigarettes a day ? ?? Dicsussion on advise to quit smoking and smoking impacts: Cardiovascular impact ? ?? Patient's willingness to quit: Willing to quit ? ?? Methods to quit smoking discussed: Nicotine replacement ? ?? Medication management of smoking session drugs discussed: Nicotine replacement ? ?? Resources provided:  AVS  ? ?? Setting quit date not established ? ?? Follow-up arranged 2 months, referral to licensed clinical social work for behavioral therapy given ? ? ?Time spent counseling the patient: 5 minutes ? ?

## 2021-09-30 NOTE — Assessment & Plan Note (Signed)
Given lifestyle medicine handout ?

## 2021-10-01 ENCOUNTER — Telehealth: Payer: Self-pay

## 2021-10-01 LAB — BASIC METABOLIC PANEL
BUN/Creatinine Ratio: 16 (ref 10–24)
BUN: 25 mg/dL (ref 8–27)
CO2: 26 mmol/L (ref 20–29)
Calcium: 10.8 mg/dL — ABNORMAL HIGH (ref 8.6–10.2)
Chloride: 103 mmol/L (ref 96–106)
Creatinine, Ser: 1.59 mg/dL — ABNORMAL HIGH (ref 0.76–1.27)
Glucose: 94 mg/dL (ref 70–99)
Potassium: 4 mmol/L (ref 3.5–5.2)
Sodium: 143 mmol/L (ref 134–144)
eGFR: 47 mL/min/{1.73_m2} — ABNORMAL LOW (ref >59)

## 2021-10-01 NOTE — Telephone Encounter (Signed)
Pt was called and is aware of results, DOB was confirmed.  ?

## 2021-10-01 NOTE — Telephone Encounter (Signed)
-----   Message from Elsie Stain, MD sent at 10/01/2021  5:54 AM EDT ----- ?Let pt know kidney function is stable potassium is normal  no changes ?

## 2021-10-08 DIAGNOSIS — H524 Presbyopia: Secondary | ICD-10-CM | POA: Diagnosis not present

## 2021-10-08 DIAGNOSIS — H5203 Hypermetropia, bilateral: Secondary | ICD-10-CM | POA: Diagnosis not present

## 2021-10-15 ENCOUNTER — Ambulatory Visit (INDEPENDENT_AMBULATORY_CARE_PROVIDER_SITE_OTHER): Payer: Medicare HMO | Admitting: Internal Medicine

## 2021-10-15 ENCOUNTER — Encounter: Payer: Self-pay | Admitting: Internal Medicine

## 2021-10-15 ENCOUNTER — Other Ambulatory Visit: Payer: Self-pay

## 2021-10-15 VITALS — BP 130/82 | HR 96 | Wt 246.0 lb

## 2021-10-15 DIAGNOSIS — E1122 Type 2 diabetes mellitus with diabetic chronic kidney disease: Secondary | ICD-10-CM | POA: Diagnosis not present

## 2021-10-15 DIAGNOSIS — E1142 Type 2 diabetes mellitus with diabetic polyneuropathy: Secondary | ICD-10-CM

## 2021-10-15 DIAGNOSIS — E785 Hyperlipidemia, unspecified: Secondary | ICD-10-CM | POA: Diagnosis not present

## 2021-10-15 DIAGNOSIS — N1832 Chronic kidney disease, stage 3b: Secondary | ICD-10-CM

## 2021-10-15 DIAGNOSIS — E1165 Type 2 diabetes mellitus with hyperglycemia: Secondary | ICD-10-CM | POA: Diagnosis not present

## 2021-10-15 DIAGNOSIS — Z794 Long term (current) use of insulin: Secondary | ICD-10-CM | POA: Diagnosis not present

## 2021-10-15 DIAGNOSIS — E1169 Type 2 diabetes mellitus with other specified complication: Secondary | ICD-10-CM | POA: Diagnosis not present

## 2021-10-15 LAB — POCT GLYCOSYLATED HEMOGLOBIN (HGB A1C): Hemoglobin A1C: 8.1 % — AB (ref 4.0–5.6)

## 2021-10-15 MED ORDER — NOVOLOG FLEXPEN 100 UNIT/ML ~~LOC~~ SOPN: PEN_INJECTOR | SUBCUTANEOUS | 3 refills | Status: DC

## 2021-10-15 MED ORDER — TRESIBA FLEXTOUCH 200 UNIT/ML ~~LOC~~ SOPN: 70.0000 [IU] | PEN_INJECTOR | Freq: Every day | SUBCUTANEOUS | 3 refills | Status: DC

## 2021-10-15 MED ORDER — DAPAGLIFLOZIN PROPANEDIOL 10 MG PO TABS: 10.0000 mg | ORAL_TABLET | Freq: Every day | ORAL | 3 refills | Status: DC

## 2021-10-15 MED ORDER — PIOGLITAZONE HCL 15 MG PO TABS: 15.0000 mg | ORAL_TABLET | Freq: Every day | ORAL | 2 refills | Status: DC

## 2021-10-15 MED ORDER — INSULIN PEN NEEDLE 31G X 5 MM MISC: 1.0000 | Freq: Four times a day (QID) | 3 refills | Status: DC

## 2021-10-15 NOTE — Progress Notes (Signed)
?Name: Reginald Rodriguez  ?MRN/ DOB: [id number], [birth year]   ?Age/ Sex: 71 y.o., male   ? ?PCP: Elsie Stain, MD   ?Reason for Endocrinology Evaluation: Type 2 Diabetes Mellitus  ?   ?Date of Initial Endocrinology Visit: 03/12/2021  ? ? ?PATIENT IDENTIFIER: [name] is a 71 y.o. male with a past medical history of DM, HTN , COPD , Hx of cocaine abuse and Dyslipidemia. The patient presented for initial endocrinology clinic visit on 03/12/2021 for consultative assistance with his diabetes management.  ? ? ? ?DIABETIC HISTORY:  ?[name] was diagnosed with DM many years ago. His hemoglobin A1c has ranged from 7.7%, peaking at 9.7% in 2021 ? ?He is intolerant to higher doses of Trulicity  ? ?On his initial visit to our clinic his A1c was 9.2 % we started farxiga, continued Trulicity and Basal/ Prandial regimen  ? ?Trulicity was stopped 08/8784 as he could only tolerate the smallest dose and we opted to start pioglitazone ? ?SUBJECTIVE:  ? ?During the last visit (06/16/2021): A1c 8.4 % We increased Farxiga, continued Trulicity, and MDI regimen  ? ? ? ? ?Today (10/15/21): [name] is here for a follow up on diabetes management.   He checks his sugars blood sugars 2 times daily. The patient has not had hypoglycemic episodes since the last clinic visit. ? ?Dexcom is cost prohibitive  ?Denies nausea vomiting, or diarrhea  ?No LE edema  ? ?HOME DIABETES REGIMEN: ?Tresiba 70 units daily at night  ?Novolog 34 units with breakfast, 34 units with lunch, and 24 units with supper ?Trulicity 7.67 mg weekly  ?Farxiga 10 mg daily  ? ? ? ? ? ?Statin: yes ?ACE-I/ARB: yes ? ? ?METER DOWNLOAD SUMMARY: 11/14-11/28/2022 ?This am 136 ? ? ?151-235 ? ? ? ? ?DIABETIC COMPLICATIONS: ?Microvascular complications:  ?CKD III, neuropathy  ?Denies: retinopathy  ?Last eye exam: Completed 2022 ? ?Macrovascular complications:  ?CVA ( Right hemiparesis )  ?Denies: CAD, PVD ? ? ?PAST HISTORY: ?Past Medical History:  ?Past Medical History:   ?Diagnosis Date  ? Diabetes mellitus without complication ([place])   ? Hypertension   ? Stroke [facility])   ? ?Past Surgical History: No past surgical history on file.  ?Social History:  reports that he has been smoking cigarettes. He has a 46.00 pack-year smoking history. He has quit using smokeless tobacco. He reports that he does not currently use alcohol. He reports that he does not currently use drugs. ?Family History: No family history on file. ? ? ?HOME MEDICATIONS: ?Allergies as of 10/15/2021   ? ?   Reactions  ? Bee Venom Swelling  ? Penicillins Rash  ? ?  ? ?  ?Medication List  ?  ? ?  ? Accurate as of October 15, 2021  9:26 AM. If you have any questions, ask your nurse or doctor.  ?  ?  ? ?  ? ?Accu-Chek Guide test strip ?Generic drug: glucose blood ?USE TO CHECK BLOOD SUGAR 2 TIMES DAILY ?  ?Accu-Chek Softclix Lancets lancets ?Use to check blood sugar TWICE DAILY. ?  ?amLODipine 5 MG tablet ?Commonly known as: NORVASC ?TAKE 1 TABLET EVERY DAY ?  ?aspirin 81 MG EC tablet ?Take 1 tablet (81 mg total) by mouth daily. ?  ?atorvastatin 10 MG tablet ?Commonly known as: LIPITOR ?Take 1 tablet (10 mg total) by mouth daily. ?  ?chlorthalidone 25 MG tablet ?Commonly known as: HYGROTON ?TAKE 1 TABLET EVERY DAY ?  ?dapagliflozin propanediol 10 MG Tabs tablet ?Commonly  known as: [place] ?Take 1 tablet (10 mg total) by mouth daily before breakfast. ?  ?fenofibrate micronized 134 MG capsule ?Commonly known as: LOFIBRA ?Take 1 capsule (134 mg total) by mouth daily before breakfast. ?  ?gabapentin 100 MG capsule ?Commonly known as: NEURONTIN ?Take 1 capsule (100 mg total) by mouth 2 (two) times daily. ?  ?lactulose (encephalopathy) 10 GM/15ML Soln ?Commonly known as: [place] ?Take 15 mLs (10 g total) by mouth daily as needed (constipation). ?  ?lisinopril 2.5 MG tablet ?Commonly known as: ZESTRIL ?Take 1 tablet (2.5 mg total) by mouth daily. ?  ?NovoLOG FlexPen 100 UNIT/ML FlexPen ?Generic drug: insulin aspart ?INJECT UNDER THE  SKIN - 34 UNITS AT BREAKFAST, 34 UNITS AT LUNCH AND 24 UNITS AT DINNER ?  ?TechLite Pen Needles 31G X 5 MM Misc ?Generic drug: Insulin Pen Needle ?Use in the morning, at noon, in the evening, and at bedtime. ?  ?BD Pen Needle Nano U/F 32G X 4 MM Misc ?Generic drug: Insulin Pen Needle ?Inject into the skin. ?  ?[name] FlexTouch 200 UNIT/ML FlexTouch Pen ?Generic drug: insulin degludec ?Inject 72 Units into the skin daily in the afternoon. ?  ?Trulicity 7.41 OI/7.8MV Sopn ?Generic drug: Dulaglutide ?Inject 0.75 mg into the skin once a week. ?  ? ?  ? ? ? ?ALLERGIES: ?Allergies  ?Allergen Reactions  ? Bee Venom Swelling  ? Penicillins Rash  ? ?  ?OBJECTIVE:  ? ?VITAL SIGNS: BP 130/82 (BP Location: Left Arm, Patient Position: Sitting, Cuff Size: Large)   Pulse 96   Wt 246 lb (111.6 kg)   SpO2 99%   BMI 38.53 kg/m?   ? ?PHYSICAL EXAM:  ?General: Pt appears well and is in NAD with a walker  ?Neck: General: Supple without adenopathy or carotid bruits. ?Thyroid: Thyroid size normal.  No goiter or nodules appreciated.  ?Lungs: Clear with good BS bilat with no rales, rhonchi, or wheezes  ?Heart: RRR with normal S1 and S2 and no gallops; no murmurs; no rub  ?Extremities:  ?Lower extremities - No pretibial edema. No lesions.  ?Neuro: MS is good with appropriate affect, pt is alert and Ox3  ? ? ?DM Foot Exam 06/16/2021 ?The skin of the feet is ithout sores or ulcerations. ?The pedal pulses are 1+ on right and 1+ on left. ?The sensation is intact to a screening 5.07, 10 gram monofilament bilaterally ? ? ?DATA REVIEWED: ? ?Lab Results  ?Component Value Date  ? HGBA1C 8.1 (A) 10/15/2021  ? HGBA1C 8.4 (A) 06/16/2021  ? HGBA1C 9.2 (A) 01/08/2021  ? ?Lab Results  ?Component Value Date  ? [place] 50 12/17/2020  ? CREATININE 1.59 (H) 09/30/2021  ? ?Elevated ? ?ASSESSMENT / PLAN / RECOMMENDATIONS:  ? ?1) Type 2 Diabetes Mellitus, Poorly controlled, With Neuropathic, CKD III and macrovascular  complications - Most recent A1c of 8.1   %. Goal A1c < 7.0 %.   ? ?- A1c down from 9.2%  ?- I have praised the pt on improved glycemic control  ?- Dexcom has been cost prohibitive  ?- Tends to forget the Trulicity at times,  he is intolerant to higher doses of Trulicity, and given he is on a small dose we have opted to stop it, and we will start pioglitazone ? ?MEDICATIONS: ?Stop Trulicity ?Start pioglitazone 15 mg daily ?Continue farxiga  10 tablet every morning  ?Continue Tresiba 70 units once daily  ?Continue Novolog 34 units with Breakfast, 34 units with Lunch and 24 units with Supper  ? ? ?  EDUCATION / INSTRUCTIONS: ?BG monitoring instructions: Patient is instructed to check his blood sugars 3 times a day, before meals. ?Call [facility] if: BG persistently < 70  ?I reviewed the Rule of 15 for the treatment of hypoglycemia in detail with the patient. Literature supplied. ? ? ?2) Diabetic complications:  ?Eye: Does not have known diabetic retinopathy.  ?Neuro/ Feet: Does  have known diabetic peripheral neuropathy. ?Renal: Patient does  have known baseline CKD. He is  on an ACEI/ARB at present. ? ? ?3)Dyslipidemia : ? ? ?- LDL at goal at 29 mg/dL , Tg elevated  ?- NO changes  ? ?Continue atorvastatin 10 mg daily  ?Continue fenofibrate 134 mg  ? ? ? ? ? ? ?Signed electronically by: ?Abby Nena Jordan, MD ? ?[facility]  ?[facility] ?[address] ?[place], [place] [address] ?Phone: [phone] ?FAX: [phone]  ? ?CC: ?Elsie Stain, MD ?[address] ?[place] [address] ?Phone: [phone]  ?Fax: [phone] ? ? ? ?Return to Endocrinology clinic as below: ?Future Appointments  ?Date Time Provider [place]  ?12/31/2021  8:30 AM Elsie Stain, MD [id number] None  ? ?  ? ?

## 2021-10-15 NOTE — Patient Instructions (Addendum)
-   STOP Trulicity  ?- Start Pioglitazone (Actos) 15 mg daily  ?- Continue farxiga 10 tablet every morning  ?- Continue Tresiba 70 units once daily  ?- Continue   Novolog 34 units with Breakfast, 34 units with Lunch and 24 units with Supper  ? ? ? ? ?HOW TO TREAT LOW BLOOD SUGARS (Blood sugar LESS THAN 70 MG/DL) ?Please follow the RULE OF 15 for the treatment of hypoglycemia treatment (when your (blood sugars are less than 70 mg/dL)  ? ?STEP 1: Take 15 grams of carbohydrates when your blood sugar is low, which includes:  ?3-4 GLUCOSE TABS  OR ?3-4 OZ OF JUICE OR REGULAR SODA OR ?ONE TUBE OF GLUCOSE GEL   ? ?STEP 2: RECHECK blood sugar in 15 MINUTES ?STEP 3: If your blood sugar is still low at the 15 minute recheck --> then, go back to STEP 1 and treat AGAIN with another 15 grams of carbohydrates. ? ? ? ?

## 2021-10-17 ENCOUNTER — Telehealth: Payer: Self-pay | Admitting: Critical Care Medicine

## 2021-10-17 NOTE — Telephone Encounter (Signed)
Callie calling from [facility] is calling to request OV notes from the recent OV. ? ?CB- [phone] ?Fax- [phone] ?

## 2021-10-18 NOTE — Telephone Encounter (Signed)
Will route to Dr. Bettina Gavia nurse.  ?

## 2021-10-21 NOTE — Telephone Encounter (Signed)
Paperwork faxed °

## 2021-10-24 ENCOUNTER — Other Ambulatory Visit: Payer: Self-pay | Admitting: Critical Care Medicine

## 2021-10-28 DIAGNOSIS — E1165 Type 2 diabetes mellitus with hyperglycemia: Secondary | ICD-10-CM | POA: Diagnosis not present

## 2021-11-13 ENCOUNTER — Telehealth: Payer: Self-pay | Admitting: Critical Care Medicine

## 2021-11-13 NOTE — Telephone Encounter (Signed)
Copied from [facility] [id number]. Topic: General - Other ?>> Nov 13, 2021 12:53 PM Yvette Rack wrote: ?Reason for CRM: Pt requests that Dr. Joya Gaskins call him regarding an old [facility] card that he was issued. Pt declined to provide further information. Cb# [phone] ?

## 2021-11-13 NOTE — Telephone Encounter (Signed)
Called pt and he said that he has been trying to call you but is not able to get through. He just wanted a call from you with the new number(if you changed it) ?

## 2021-11-13 NOTE — Telephone Encounter (Signed)
That is my back office number as it is no longer monitored by me I prefer he call front office as all pts do for any requests ?

## 2021-11-14 NOTE — Telephone Encounter (Signed)
Called and left vm  °

## 2021-12-31 ENCOUNTER — Encounter: Payer: Self-pay | Admitting: Critical Care Medicine

## 2021-12-31 ENCOUNTER — Ambulatory Visit: Payer: Medicare HMO | Attending: Critical Care Medicine | Admitting: Critical Care Medicine

## 2021-12-31 VITALS — BP 123/88 | HR 82 | Wt 249.8 lb

## 2021-12-31 DIAGNOSIS — Z794 Long term (current) use of insulin: Secondary | ICD-10-CM

## 2021-12-31 DIAGNOSIS — E162 Hypoglycemia, unspecified: Secondary | ICD-10-CM

## 2021-12-31 DIAGNOSIS — N1831 Chronic kidney disease, stage 3a: Secondary | ICD-10-CM | POA: Diagnosis not present

## 2021-12-31 DIAGNOSIS — E1165 Type 2 diabetes mellitus with hyperglycemia: Secondary | ICD-10-CM

## 2021-12-31 DIAGNOSIS — E1122 Type 2 diabetes mellitus with diabetic chronic kidney disease: Secondary | ICD-10-CM

## 2021-12-31 DIAGNOSIS — N1832 Chronic kidney disease, stage 3b: Secondary | ICD-10-CM

## 2021-12-31 DIAGNOSIS — E1169 Type 2 diabetes mellitus with other specified complication: Secondary | ICD-10-CM

## 2021-12-31 DIAGNOSIS — N393 Stress incontinence (female) (male): Secondary | ICD-10-CM | POA: Insufficient documentation

## 2021-12-31 DIAGNOSIS — Z6837 Body mass index (BMI) 37.0-37.9, adult: Secondary | ICD-10-CM | POA: Diagnosis not present

## 2021-12-31 DIAGNOSIS — I6930 Unspecified sequelae of cerebral infarction: Secondary | ICD-10-CM

## 2021-12-31 DIAGNOSIS — I10 Essential (primary) hypertension: Secondary | ICD-10-CM

## 2021-12-31 DIAGNOSIS — Z720 Tobacco use: Secondary | ICD-10-CM | POA: Diagnosis not present

## 2021-12-31 DIAGNOSIS — B351 Tinea unguium: Secondary | ICD-10-CM

## 2021-12-31 DIAGNOSIS — E785 Hyperlipidemia, unspecified: Secondary | ICD-10-CM

## 2021-12-31 MED ORDER — AMLODIPINE BESYLATE 5 MG PO TABS
5.0000 mg | ORAL_TABLET | Freq: Every day | ORAL | 2 refills | Status: DC
Start: 2021-12-31 — End: 2022-08-28

## 2021-12-31 MED ORDER — CHLORTHALIDONE 25 MG PO TABS
25.0000 mg | ORAL_TABLET | Freq: Every day | ORAL | 2 refills | Status: DC
Start: 2021-12-31 — End: 2022-08-28

## 2021-12-31 MED ORDER — FENOFIBRATE 134 MG PO CAPS: 134.0000 mg | ORAL_CAPSULE | Freq: Every day | ORAL | 3 refills | Status: DC

## 2021-12-31 MED ORDER — GLUCOSE 4 G PO CHEW: 1.0000 | CHEWABLE_TABLET | Freq: Once | ORAL | Status: AC

## 2021-12-31 NOTE — Assessment & Plan Note (Signed)
The following Lifestyle Medicine recommendations according to [employer] of Lifestyle [employer] (ACLM) were discussed and offered to patient who agrees to start the journey:  A. Whole Foods, Plant-based plate comprising of fruits and vegetables, plant-based proteins, whole-grain carbohydrates was discussed in detail with the patient.   A list for source of those nutrients were also provided to the patient.  Patient will use only water or unsweetened tea for hydration. B.  The need to stay away from risky substances including alcohol, smoking; obtaining 7 to 9 hours of restorative sleep, at least 150 minutes of moderate intensity exercise weekly, the importance of healthy social connections,  and stress reduction techniques were discussed. 

## 2021-12-31 NOTE — Progress Notes (Signed)
Established Patient Office Visit  Subjective:  Patient ID: Reginald Rodriguez, male    DOB: July 25, 1950  Age: 71 y.o. MRN: [id number]  CC:  DM f/u  HPI 06/16/21 Reginald Rodriguez presents for diabetes follow-up.  Patient just saw endocrinology earlier and did have A1c 8.4 which is down from prior 9.1 level.  Upon review of the patient's glucose meter he averages fasting sugars in the low 200 range and postprandial in the 300 range.  The patient did not tolerate high-dose Trulicity.  Plan is for the patient to increase his dosing of Farxiga to 10 mg daily and his [name] and NovoLog dosing remains the same.  Patient remains on the same dose of Trulicity  Patient denies any alcohol use.  He is smoking about 6 to 7 cigarettes daily.  He states he is somewhat stressed at times with his condition.  Patient is agreeable to having clinical social work for consultation for behavioral therapy.  Patient maintains some constipation with elevated blood sugars and does use lactulose for this.  Patient did agree to receive the pneumonia vaccine at this visit.  He is already had his flu vaccine. Patient does maintain atorvastatin and fenofibrate.  Patient is requesting a cane to assist with ambulation.  Note patient did have low-dose CT scanning of the chest which was negative for cancer.  09/30/21 Patient is seen in return follow-up for type 2 diabetes.  Patient brings his glucometer with him and his blood sugars are averaging 179 fasting in the morning and 193 to greater than 200 in the evening.  On arrival today blood sugars 116.  The patient has been had difficulty following a low-carb diet.  Patient has no other  complaints. Patient is due an eye exam in this calendar year.  And needs refills on his medications.  6/14 This patient returns in follow-up and did see endocrinology after the last visit with me in March.  At that visit Trulicity was stopped and pioglitazone was started.  In addition the patient  was maintained on fixed dose NovoLog 3 times daily.  Patient now has a Dexcom meter and has been able to monitor his blood sugars more accurately.  He typically runs in the low 90s in the morning only has a sandwich for breakfast and then 10 sometimes runs in the 50s to 60 range 2 hours later after receiving a 34 unit dose of the NovoLog.  He then has to take an extra meal of some type or some type of carbohydrate.  He is not on a sliding scale program currently is on fixed dosing.  Another issue is that he is frequently urinating on himself but tickly at night with some stress incontinence.  Another problem is that he is not ambulating well with his walker.  He is having increased weakness in the right lower extremity and some pain in the lower extremity as well with burning in the feet.  He is interested in applying for a [employer] type electronic wheelchair motorized.  Patient is still smoking about a pack a day but does not drink alcohol.  On arrival blood pressure is 123/88.  A1c was 8.12 months ago with endocrinology he has follow-up with endocrinology pending.  Patient needs refills on his blood pressure medication he is compliant with same.  He is due a urine microalbumin at this visit.  Below is documentation from the last endocrine visit.  Endo 3/29  1) Type 2 Diabetes Mellitus, Poorly controlled, With Neuropathic, CKD III and  macrovascular  complications - Most recent A1c of 8.1  %. Goal A1c < 7.0 %.     - A1c down from 9.2%  - I have praised the pt on improved glycemic control  - Dexcom has been cost prohibitive  - Tends to forget the Trulicity at times,  he is intolerant to higher doses of Trulicity, and given he is on a small dose we have opted to stop it, and we will start pioglitazone   MEDICATIONS: Stop Trulicity Start pioglitazone 15 mg daily Continue farxiga  10 tablet every morning  Continue Tresiba 70 units once daily  Continue Novolog 34 units with Breakfast, 34 units with Lunch  and 24 units with Supper       2) Diabetic complications:  Eye: Does not have known diabetic retinopathy.  Neuro/ Feet: Does  have known diabetic peripheral neuropathy. Renal: Patient does  have known baseline CKD. He is  on an ACEI/ARB at present.     3)Dyslipidemia :     - LDL at goal at 29 mg/dL , Tg elevated  - NO changes     Past Medical History:  Diagnosis Date   Diabetes mellitus without complication ([place])    Hypertension    Stroke [facility])     History reviewed. No pertinent surgical history.  History reviewed. No pertinent family history.  Social History   Socioeconomic History   Marital status: Single    Spouse name: Not on file   Number of children: Not on file   Years of education: Not on file   Highest education level: Not on file  Occupational History   Not on file  Tobacco Use   Smoking status: Every Day    Packs/day: 1.00    Years: 46.00    Total pack years: 46.00    Types: Cigarettes   Smokeless tobacco: Former  Scientific laboratory technician Use: Never used  Substance and Sexual Activity   Alcohol use: Not Currently   Drug use: Not Currently   Sexual activity: Not on file  Other Topics Concern   Not on file  Social History Narrative   Not on file   Social Determinants of Health   Financial Resource Strain: Not on file  Food Insecurity: Not on file  Transportation Needs: Not on file  Physical Activity: Not on file  Stress: Not on file  Social Connections: Not on file  Intimate Partner Violence: Not on file    Outpatient Medications Prior to Visit  Medication Sig Dispense Refill   ACCU-CHEK GUIDE test strip USE TO CHECK BLOOD SUGAR 2 TIMES DAILY 100 strip 2   Accu-Chek Softclix Lancets lancets Use to check blood sugar TWICE DAILY. 100 each 2   aspirin 81 MG EC tablet Take 1 tablet (81 mg total) by mouth daily. 100 tablet 1   atorvastatin (LIPITOR) 10 MG tablet Take 1 tablet (10 mg total) by mouth daily. 90 tablet 3   dapagliflozin propanediol  (FARXIGA) 10 MG TABS tablet Take 1 tablet (10 mg total) by mouth daily before breakfast. 90 tablet 3   gabapentin (NEURONTIN) 100 MG capsule Take 1 capsule (100 mg total) by mouth 2 (two) times daily. 180 capsule 2   insulin degludec (TRESIBA FLEXTOUCH) 200 UNIT/ML FlexTouch Pen Inject 70 Units into the skin daily in the afternoon. 45 mL 3   Insulin Pen Needle 31G X 5 MM MISC Use in the morning, at noon, in the evening, and at bedtime. 400 each 3  lactulose, encephalopathy, (CHRONULAC) 10 GM/15ML SOLN Take 15 mLs (10 g total) by mouth daily as needed (constipation). 480 mL 3   lisinopril (ZESTRIL) 2.5 MG tablet Take 1 tablet (2.5 mg total) by mouth daily. 90 tablet 3   NOVOLOG FLEXPEN 100 UNIT/ML FlexPen INJECT UNDER THE SKIN - 34 UNITS AT BREAKFAST, 34 UNITS AT LUNCH AND 24 UNITS AT DINNER 90 mL 3   pioglitazone (ACTOS) 15 MG tablet Take 1 tablet (15 mg total) by mouth daily. 90 tablet 2   amLODipine (NORVASC) 5 MG tablet TAKE 1 TABLET EVERY DAY 90 tablet 2   chlorthalidone (HYGROTON) 25 MG tablet TAKE 1 TABLET EVERY DAY 90 tablet 2   fenofibrate micronized (LOFIBRA) 134 MG capsule Take 1 capsule (134 mg total) by mouth daily before breakfast. 90 capsule 3   TRULICITY 3.47 QQ/5.9DG SOPN Inject into the skin.     No facility-administered medications prior to visit.    Allergies  Allergen Reactions   Bee Venom Swelling   Penicillins Rash    ROS Review of Systems  Constitutional:  Negative for chills, diaphoresis and fever.  HENT:  Negative for congestion, hearing loss, nosebleeds, sore throat and tinnitus.   Eyes:  Negative for photophobia and redness.  Respiratory:  Negative for cough, shortness of breath, wheezing and stridor.   Cardiovascular:  Negative for chest pain, palpitations and leg swelling.  Gastrointestinal:  Negative for abdominal pain, blood in stool, constipation, diarrhea, nausea and vomiting.  Endocrine: Negative for polydipsia.  Genitourinary:  Negative for dysuria,  flank pain, frequency, hematuria and urgency.  Musculoskeletal:  Positive for gait problem. Negative for back pain, myalgias and neck pain.  Skin:  Negative for rash.  Allergic/Immunologic: Negative for environmental allergies.  Neurological:  Negative for dizziness, tremors, seizures, weakness and headaches.  Hematological:  Does not bruise/bleed easily.  Psychiatric/Behavioral:  Negative for suicidal ideas. The patient is not nervous/anxious.       Objective:    Physical Exam Vitals reviewed.  Constitutional:      Appearance: Normal appearance. He is well-developed. He is obese. He is not diaphoretic.  HENT:     Head: Normocephalic and atraumatic.     Nose: No nasal deformity, septal deviation, mucosal edema or rhinorrhea.     Right Sinus: No maxillary sinus tenderness or frontal sinus tenderness.     Left Sinus: No maxillary sinus tenderness or frontal sinus tenderness.     Mouth/Throat:     Pharynx: No oropharyngeal exudate.  Eyes:     General: No scleral icterus.    Conjunctiva/sclera: Conjunctivae normal.     Pupils: Pupils are equal, round, and reactive to light.  Neck:     Thyroid: No thyromegaly.     Vascular: No carotid bruit or JVD.     Trachea: Trachea normal. No tracheal tenderness or tracheal deviation.  Cardiovascular:     Rate and Rhythm: Normal rate and regular rhythm.     Chest Wall: PMI is not displaced.     Pulses: Normal pulses. No decreased pulses.     Heart sounds: Normal heart sounds, S1 normal and S2 normal. Heart sounds not distant. No murmur heard.    No systolic murmur is present.     No diastolic murmur is present.     No friction rub. No gallop. No S3 or S4 sounds.  Pulmonary:     Effort: Pulmonary effort is normal. No tachypnea, accessory muscle usage or respiratory distress.     Breath sounds: No stridor.  No decreased breath sounds, wheezing, rhonchi or rales.     Comments: Distant breath sounds Chest:     Chest wall: No tenderness.   Abdominal:     General: Bowel sounds are normal. There is no distension.     Palpations: Abdomen is soft. Abdomen is not rigid.     Tenderness: There is no abdominal tenderness. There is no guarding or rebound.  Musculoskeletal:        General: Normal range of motion.     Cervical back: Normal range of motion and neck supple. No edema, erythema or rigidity. No muscular tenderness. Normal range of motion.  Lymphadenopathy:     Head:     Right side of head: No submental or submandibular adenopathy.     Left side of head: No submental or submandibular adenopathy.     Cervical: No cervical adenopathy.  Skin:    General: Skin is warm and dry.     Coloration: Skin is not pale.     Findings: No rash.     Nails: There is no clubbing.  Neurological:     Mental Status: He is alert and oriented to person, place, and time. Mental status is at baseline.     Sensory: No sensory deficit.     Motor: Weakness present.     Coordination: Coordination abnormal.     Gait: Gait abnormal.     Comments: Gait disturbance right lower extremity which is unchanged from prior exams  Psychiatric:        Speech: Speech normal.        Behavior: Behavior normal.     BP 123/88   Pulse 82   Wt 249 lb 12.8 oz (113.3 kg)   SpO2 98%   BMI 39.12 kg/m  Wt Readings from Last 3 Encounters:  12/31/21 249 lb 12.8 oz (113.3 kg)  10/15/21 246 lb (111.6 kg)  09/30/21 242 lb 9.6 oz (110 kg)     Health Maintenance Due  Topic Date Due   Zoster Vaccines- Shingrix (1 of 2) Never done   COVID-19 Vaccine (5 - Pfizer series) 03/19/2021    There are no preventive care reminders to display for this patient.  No results found for: "TSH" Lab Results  Component Value Date   WBC 8.8 05/23/2021   HGB 14.5 05/23/2021   HCT 42.1 05/23/2021   MCV 90.3 05/23/2021   PLT 248 05/23/2021   Lab Results  Component Value Date   NA 143 09/30/2021   K 4.0 09/30/2021   CO2 26 09/30/2021   GLUCOSE 94 09/30/2021   BUN 25  09/30/2021   CREATININE 1.59 (H) 09/30/2021   BILITOT 1.1 05/23/2021   ALKPHOS 51 05/23/2021   AST 40 05/23/2021   ALT 25 05/23/2021   PROT 6.4 (L) 05/23/2021   ALBUMIN 3.6 05/23/2021   CALCIUM 10.8 (H) 09/30/2021   ANIONGAP 8 05/23/2021   EGFR 47 (L) 09/30/2021   Lab Results  Component Value Date   CHOL 112 12/17/2020   Lab Results  Component Value Date   HDL 33 (L) 12/17/2020   Lab Results  Component Value Date   LDLCALC 50 12/17/2020   Lab Results  Component Value Date   TRIG 175 (H) 12/17/2020   Lab Results  Component Value Date   CHOLHDL 3.4 12/17/2020   Lab Results  Component Value Date   HGBA1C 8.1 (A) 10/15/2021      Assessment & Plan:   Problem List Items Addressed This Visit  Cardiovascular and Mediastinum   Essential hypertension    Blood pressure well controlled no change in medications at this time  Refills given      Relevant Medications   amLODipine (NORVASC) 5 MG tablet   chlorthalidone (HYGROTON) 25 MG tablet   fenofibrate micronized (LOFIBRA) 134 MG capsule     Endocrine   Uncontrolled type 2 diabetes mellitus with hyperglycemia (HCC)    Blood sugar in the low 60s on arrival he was given a glucose tablet and immediately went up into the 70 range.  Wondering if he would benefit from a sliding scale of his NovoLog now that he has a Dexcom device.  He seems to be very engaged and I believe he would be able to adjust his insulin dosing.  I will confer with his endocrinologist on this matter.  Otherwise no change in medications      Relevant Medications   glucose chewable tablet 4 g   Other Relevant Orders   Urine microalbumin-creatinine with uACR   Onychomycosis of multiple toenails with type 2 diabetes mellitus ([place])    Stable with foot therapy with podiatry      Relevant Medications   glucose chewable tablet 4 g   Hyperlipidemia associated with type 2 diabetes mellitus (HCC)    Lipids at goal no change in dosing of statin       Relevant Medications   glucose chewable tablet 4 g   amLODipine (NORVASC) 5 MG tablet   chlorthalidone (HYGROTON) 25 MG tablet   fenofibrate micronized (LOFIBRA) 134 MG capsule   Type 2 diabetes mellitus with stage 3b chronic kidney disease, with long-term current use of insulin (HCC)    Renal function stable at this time we will monitor      Relevant Medications   glucose chewable tablet 4 g     Genitourinary   CKD (chronic kidney disease) stage 3, GFR 30-59 ml/min (HCC)    Stable renal function we will monitor        Other   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    Patient struggling with right-sided hemiparesis he does ambulate with the rollator but would benefit from motorized wheelchair will place order we will likely need to bring him in for an official face-to-face visit to go through all the requirements for this and I told him this would take some time with Medicare      Relevant Orders   DME Wheelchair electric   Tobacco use       Current smoking consumption amount: Several cigarettes a day  Dicsussion on advise to quit smoking and smoking impacts: Cardiovascular impact  Patient's willingness to quit: Willing to quit  Methods to quit smoking discussed: Nicotine replacement  Medication management of smoking session drugs discussed: Nicotine replacement  Resources provided:  AVS   Setting quit date not established  Follow-up arranged 4 months   Time spent counseling the patient: 5 minutes       BMI 37.0-37.9, adult    The following Lifestyle Medicine recommendations according to [place] of Lifestyle Medicine [facility]) were discussed and offered to patient who agrees to start the journey:  A. Whole Foods, Plant-based plate comprising of fruits and vegetables, plant-based proteins, whole-grain carbohydrates was discussed in detail with the patient.   A list for source of those nutrients were also provided to the patient.   Patient will use only water or unsweetened tea for hydration. B.  The need to stay away from  risky substances including alcohol, smoking; obtaining 7 to 9 hours of restorative sleep, at least 150 minutes of moderate intensity exercise weekly, the importance of healthy social connections,  and stress reduction techniques were discussed. .       Stress incontinence    Likely due to use Farxiga and sugar variations  Would benefit from depends  Order placed      Relevant Orders   For home use only DME Other see comment   Other Visit Diagnoses     Hypoglycemia    -  Primary   Relevant Medications   glucose chewable tablet 4 g      Meds ordered this encounter  Medications   glucose chewable tablet 4 g   amLODipine (NORVASC) 5 MG tablet    Sig: Take 1 tablet (5 mg total) by mouth daily.    Dispense:  90 tablet    Refill:  2   chlorthalidone (HYGROTON) 25 MG tablet    Sig: Take 1 tablet (25 mg total) by mouth daily.    Dispense:  90 tablet    Refill:  2   fenofibrate micronized (LOFIBRA) 134 MG capsule    Sig: Take 1 capsule (134 mg total) by mouth daily before breakfast.    Dispense:  90 capsule    Refill:  3   Follow-up: Return in about 3 months (around 04/02/2022).    Asencion Noble, MD

## 2021-12-31 NOTE — Patient Instructions (Signed)
We will bring you back in for an assessment for a [employer] I will communicate with my nurse case manager  We will get to the depends for your incontinence this to be shipped to your home  No change in medications for now refills on some year long-term medicines were [facility]  Continue to work on reducing your tobacco intake  Dr Joya Gaskins to communicate with your endocrinologist about adjusting your insulin dose and perhaps putting you on a sliding scale for the short acting insulin so you do not drop your blood sugar so quickly in the morning  We discussed you obtaining Glucerna or low carbohydrate Ensure to take as a drink in the morning  Return to see Dr. Joya Gaskins 3 months

## 2021-12-31 NOTE — Assessment & Plan Note (Signed)
Renal function stable at this time we will monitor

## 2021-12-31 NOTE — Assessment & Plan Note (Signed)
Lipids at goal no change in dosing of statin

## 2021-12-31 NOTE — Assessment & Plan Note (Signed)
Patient struggling with right-sided hemiparesis he does ambulate with the rollator but would benefit from motorized wheelchair will place order we will likely need to bring him in for an official face-to-face visit to go through all the requirements for this and I told him this would take some time with Medicare

## 2021-12-31 NOTE — Assessment & Plan Note (Signed)
Stable renal function we will monitor

## 2021-12-31 NOTE — Assessment & Plan Note (Signed)
Blood pressure well controlled no change in medications at this time  Refills given

## 2021-12-31 NOTE — Assessment & Plan Note (Signed)
Blood sugar in the low 60s on arrival he was given a glucose tablet and immediately went up into the 70 range.  Wondering if he would benefit from a sliding scale of his NovoLog now that he has a Dexcom device.  He seems to be very engaged and I believe he would be able to adjust his insulin dosing.  I will confer with his endocrinologist on this matter.  Otherwise no change in medications

## 2021-12-31 NOTE — Assessment & Plan Note (Signed)
Stable with foot therapy with podiatry

## 2021-12-31 NOTE — Assessment & Plan Note (Addendum)
  .   Current smoking consumption amount: Several cigarettes a day  Dicsussion on advise to quit smoking and smoking impacts: Cardiovascular impact  Patient's willingness to quit: Willing to quit  Methods to quit smoking discussed: Nicotine replacement  Medication management of smoking session drugs discussed: Nicotine replacement  Resources provided:  AVS   Setting quit date not established  Follow-up arranged 4 months   Time spent counseling the patient: 5 minutes  

## 2021-12-31 NOTE — Assessment & Plan Note (Signed)
Likely due to use Farxiga and sugar variations  Would benefit from depends  Order placed

## 2022-01-01 ENCOUNTER — Telehealth: Payer: Self-pay | Admitting: Critical Care Medicine

## 2022-01-01 ENCOUNTER — Other Ambulatory Visit: Payer: Self-pay | Admitting: Critical Care Medicine

## 2022-01-01 ENCOUNTER — Telehealth: Payer: Self-pay

## 2022-01-01 DIAGNOSIS — N393 Stress incontinence (female) (male): Secondary | ICD-10-CM

## 2022-01-01 LAB — MICROALBUMIN / CREATININE URINE RATIO
Creatinine, Urine: 56 mg/dL
Microalb/Creat Ratio: <5 mg/g creat (ref 0–29)
Microalbumin, Urine: <3 ug/mL

## 2022-01-01 NOTE — Telephone Encounter (Signed)
Pt was called and is aware of results, DOB was confirmed.  ?

## 2022-01-01 NOTE — Telephone Encounter (Signed)
-----   Message from Elsie Stain, MD sent at 01/01/2022  1:48 PM EDT ----- Let pt know urine for albumin is normal

## 2022-01-01 NOTE — Telephone Encounter (Signed)
Order for large pull ups faxed to [facility].   I called patient back and he said he spoke to [facility] and they will be contacting this PCP office for more information.

## 2022-01-01 NOTE — Telephone Encounter (Signed)
I spoke to the patient and he informed me that he contacted [employer] and they would be requesting documentation from PCP.  The office visit note for 12/31/2021 was faxed to [facility] as requested

## 2022-01-01 NOTE — Progress Notes (Signed)
Let pt know urine for albumin is normal

## 2022-01-01 NOTE — Telephone Encounter (Signed)
Pt called back, wants a fu call, gave pt [employer] #. Just wanting to touch base/ [phone] if can provide any assistance.

## 2022-01-01 NOTE — Telephone Encounter (Signed)
Order received for incontinence products, need provider to clarify if this is for pull ups or briefs and confirm size.  Order also received for electric wheelchair.  I called patient regarding the new orders. He has no preference for incontinence supply provider and was in agreement to sending the order to [facility].  Regarding the electric wheelchair, he would prefer a scooter from [facility]. I explained to him that he will need to call [employer] to initiate the order. They will explain to him what his insurance will cover. He said he could not talk now and would call me back to get the phone number for [employer]: [phone].

## 2022-01-01 NOTE — Telephone Encounter (Signed)
Copied from [place] [phone]. Topic: General - Other >> Jan 01, 2022  1:40 PM Leitha Schuller wrote: Reason for CRM: Caren Griffins w/ [facility] requesting 12-31-2021 OV progress notes addressing patients mobility faxed to [phone]  ref # [id number]  Please document once fax is sent

## 2022-01-02 NOTE — Telephone Encounter (Signed)
Noted,  Thank you!

## 2022-01-09 DIAGNOSIS — Z880 Allergy status to penicillin: Secondary | ICD-10-CM | POA: Diagnosis not present

## 2022-01-09 DIAGNOSIS — M542 Cervicalgia: Secondary | ICD-10-CM | POA: Diagnosis not present

## 2022-01-09 DIAGNOSIS — F17210 Nicotine dependence, cigarettes, uncomplicated: Secondary | ICD-10-CM | POA: Diagnosis not present

## 2022-01-09 DIAGNOSIS — I69398 Other sequelae of cerebral infarction: Secondary | ICD-10-CM | POA: Diagnosis not present

## 2022-01-09 DIAGNOSIS — M25551 Pain in right hip: Secondary | ICD-10-CM | POA: Diagnosis not present

## 2022-01-09 DIAGNOSIS — Z79899 Other long term (current) drug therapy: Secondary | ICD-10-CM | POA: Diagnosis not present

## 2022-01-09 DIAGNOSIS — S199XXA Unspecified injury of neck, initial encounter: Secondary | ICD-10-CM | POA: Diagnosis not present

## 2022-01-09 DIAGNOSIS — S79911A Unspecified injury of right hip, initial encounter: Secondary | ICD-10-CM | POA: Diagnosis not present

## 2022-01-09 DIAGNOSIS — M79604 Pain in right leg: Secondary | ICD-10-CM | POA: Diagnosis not present

## 2022-01-09 DIAGNOSIS — R079 Chest pain, unspecified: Secondary | ICD-10-CM | POA: Diagnosis not present

## 2022-01-09 DIAGNOSIS — M546 Pain in thoracic spine: Secondary | ICD-10-CM | POA: Diagnosis not present

## 2022-01-22 DIAGNOSIS — E1165 Type 2 diabetes mellitus with hyperglycemia: Secondary | ICD-10-CM | POA: Diagnosis not present
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
## 2022-03-02 ENCOUNTER — Encounter: Payer: Self-pay | Admitting: Critical Care Medicine

## 2022-03-02 ENCOUNTER — Ambulatory Visit: Payer: Medicare HMO | Attending: Critical Care Medicine | Admitting: Critical Care Medicine

## 2022-03-02 ENCOUNTER — Telehealth: Payer: Self-pay | Admitting: Critical Care Medicine

## 2022-03-02 VITALS — BP 117/87 | HR 103 | Temp 98.1°F | Ht 67.0 in | Wt 257.2 lb

## 2022-03-02 DIAGNOSIS — E1169 Type 2 diabetes mellitus with other specified complication: Secondary | ICD-10-CM | POA: Diagnosis not present

## 2022-03-02 DIAGNOSIS — N1831 Chronic kidney disease, stage 3a: Secondary | ICD-10-CM

## 2022-03-02 DIAGNOSIS — Z720 Tobacco use: Secondary | ICD-10-CM | POA: Diagnosis not present

## 2022-03-02 DIAGNOSIS — E785 Hyperlipidemia, unspecified: Secondary | ICD-10-CM

## 2022-03-02 DIAGNOSIS — Z7409 Other reduced mobility: Secondary | ICD-10-CM | POA: Diagnosis not present

## 2022-03-02 DIAGNOSIS — E1165 Type 2 diabetes mellitus with hyperglycemia: Secondary | ICD-10-CM

## 2022-03-02 DIAGNOSIS — Z789 Other specified health status: Secondary | ICD-10-CM

## 2022-03-02 DIAGNOSIS — B351 Tinea unguium: Secondary | ICD-10-CM | POA: Diagnosis not present

## 2022-03-02 DIAGNOSIS — I10 Essential (primary) hypertension: Secondary | ICD-10-CM | POA: Diagnosis not present

## 2022-03-02 DIAGNOSIS — I6930 Unspecified sequelae of cerebral infarction: Secondary | ICD-10-CM

## 2022-03-02 DIAGNOSIS — G4733 Obstructive sleep apnea (adult) (pediatric): Secondary | ICD-10-CM

## 2022-03-02 DIAGNOSIS — J432 Centrilobular emphysema: Secondary | ICD-10-CM | POA: Diagnosis not present

## 2022-03-02 NOTE — Telephone Encounter (Signed)
I will complete the PCS order for you to sign. thanks
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
## 2022-03-02 NOTE — Patient Instructions (Signed)
No change in medications  To work on reducing your tobacco intake as we discussed  We will see if we can get you personal care services  The [facility] paperwork will be processed hopefully this will get processed soon for you  Return to see Dr. Joya Gaskins first week of December

## 2022-03-02 NOTE — Assessment & Plan Note (Signed)
Continue with CPAP does not need oxygen at night

## 2022-03-02 NOTE — Assessment & Plan Note (Signed)
BMI is increased discussed lifestyle medicine approach

## 2022-03-02 NOTE — Assessment & Plan Note (Signed)
  .   Current smoking consumption amount: Several cigarettes a day  Dicsussion on advise to quit smoking and smoking impacts: Cardiovascular impact  Patient's willingness to quit: Willing to quit  Methods to quit smoking discussed: Nicotine replacement  Medication management of smoking session drugs discussed: Nicotine replacement  Resources provided:  AVS   Setting quit date not established  Follow-up arranged 4 months   Time spent counseling the patient: 5 minutes  

## 2022-03-02 NOTE — Assessment & Plan Note (Signed)
Blood pressure well controlled no change in medications ?

## 2022-03-02 NOTE — Assessment & Plan Note (Signed)
Will need another referral to podiatry

## 2022-03-02 NOTE — Assessment & Plan Note (Signed)
Improved glycemic control with diabetes no change in medicines

## 2022-03-02 NOTE — Assessment & Plan Note (Signed)
The patient's impairment in his mobility is multifactorial.  This relates to his lung condition with desaturation, progressive weight gain, diabetic neuropathy, prior stroke with weakness in right upper and lower extremities that have progressed.  A scooter or manual wheelchair are not sufficient and he has failed the walker.  He is high risk of fall.  He can safely operate a power mobility device is mentally and physically.

## 2022-03-02 NOTE — Assessment & Plan Note (Signed)
Continue current dose of statin

## 2022-03-02 NOTE — Telephone Encounter (Signed)
Patient seen today as you know Opal Sidles and he was requesting personal care service 1 to 2 days a week with ADLs he was requesting [facility] he is spoken to Berle Mull [phone] he does have Medicaid

## 2022-03-02 NOTE — Assessment & Plan Note (Signed)
Will monitor

## 2022-03-02 NOTE — Assessment & Plan Note (Signed)
COPD playing a role in this patient's mobility he has desaturated easily with exertion he cannot use a rollator without desaturating this contributes to his weakness

## 2022-03-03 ENCOUNTER — Telehealth: Payer: Self-pay

## 2022-03-03 NOTE — Telephone Encounter (Signed)
Completed [facility]CS application faxed to [place]

## 2022-03-03 NOTE — Telephone Encounter (Signed)
I called [facility] # [phone] as instructed on order check list and spoke to Deb to inform her that clinical documentation is ready and it has been faxed to [phone]

## 2022-03-04 ENCOUNTER — Telehealth: Payer: Self-pay | Admitting: Critical Care Medicine

## 2022-03-04 DIAGNOSIS — Z7409 Other reduced mobility: Secondary | ICD-10-CM

## 2022-03-04 NOTE — Telephone Encounter (Signed)
I contacted [facility] and they are requesting an order for PT eval for power wheelchair seating to determine the chair that best meets  his needs.

## 2022-03-04 NOTE — Telephone Encounter (Signed)
[facility] called to determine the best type of chair for the pt and Needs a prescription for the PT eval / please advise / ref# [id number] Fax# [phone]

## 2022-03-04 NOTE — Telephone Encounter (Signed)
Done

## 2022-03-05 ENCOUNTER — Other Ambulatory Visit: Payer: Self-pay | Admitting: Critical Care Medicine

## 2022-03-05 ENCOUNTER — Telehealth: Payer: Self-pay | Admitting: Emergency Medicine

## 2022-03-05 NOTE — Telephone Encounter (Signed)
Patient returning your call and stated [facility] reached out to him yesterday and that Dr. Joya Gaskins sent paperwork and additional information is needed regarding what  kind of [facility] PCP would recommend. Patient would like PCP to make that decision

## 2022-03-05 NOTE — Telephone Encounter (Signed)
Copied from [place] [id number]. Topic: General - Other >> Mar 05, 2022 10:37 AM Leitha Schuller wrote: Pt states he was contacted by [facility] and inquiring why a referral was made   Please fu w/ pt

## 2022-03-05 NOTE — Telephone Encounter (Signed)
Order for PT eval-power wheelchair sitting faxed to [facility].   I called patient to explain to him that [facility] will be contacting him about the PT eval, he can disregard the order that was sent to [facility]. Message left with call back requested.

## 2022-03-05 NOTE — Telephone Encounter (Signed)
I called the patient and explained that I just sent the order to [facility] for the PT seating eval.  He explained that [facility] contacted him yesterday and he answered a lot of questions for them. I said that [facility] will call him to schedule an in person PT eval if needed. He does not have to go to [facility]. He said he understood. I also explained that [facility] will determine the best power wheelchair for him that is covered by his insurance

## 2022-03-06 NOTE — Telephone Encounter (Signed)
Called patient, Patient stated that someone called him yesterday and helped him get everything straight. Patient at this time has no question or concerns about recent referrals

## 2022-03-13 ENCOUNTER — Ambulatory Visit (INDEPENDENT_AMBULATORY_CARE_PROVIDER_SITE_OTHER): Payer: Medicare HMO | Admitting: Podiatrist

## 2022-03-13 ENCOUNTER — Encounter: Payer: Self-pay | Admitting: Podiatrist

## 2022-03-13 DIAGNOSIS — M79674 Pain in right toe(s): Secondary | ICD-10-CM

## 2022-03-13 DIAGNOSIS — B351 Tinea unguium: Secondary | ICD-10-CM

## 2022-03-13 DIAGNOSIS — M79675 Pain in left toe(s): Secondary | ICD-10-CM

## 2022-03-13 NOTE — Progress Notes (Signed)
Chief Complaint  Patient presents with   Nail Problem    Diabetic routine foot care- BS running between 120-150. Does not know latest A1C.      Subjective: Reginald Rodriguez is a 71 y.o. male patient with history of diabetes who presents to office today with a chief concern of long,mildly painful nails  while ambulating in shoes; unable to trim.  Patient relates some tingling in the right foot at the level of the toes.   Elsie Stain, MD  is his primary care provider.  Patient Active Problem List   Diagnosis Date Noted   Impaired mobility and activities of daily living 03/02/2022   Stress incontinence 12/31/2021   Type 2 diabetes mellitus with diabetic polyneuropathy, with long-term current use of insulin ([place]) 03/13/2021   Type 2 diabetes mellitus with stage 3b chronic kidney disease, with long-term current use of insulin ([place]) 03/13/2021   Obesity, Class III, BMI 40-49.9 (morbid obesity) ([place]) 12/18/2020   Hyperlipidemia associated with type 2 diabetes mellitus ([place]) 12/17/2020   Tobacco use 06/27/2020   History of cocaine abuse ([place]) 05/13/2020   Fatty liver 05/13/2020   Central sleep apnea 05/13/2020   Arthritis:Knees, right shoulder 05/13/2020   COPD with emphysema ([place]) 05/13/2020   Erectile dysfunction 05/13/2020   OSA (obstructive sleep apnea) 05/13/2020   Onychomycosis of multiple toenails with type 2 diabetes mellitus ([place]) 05/13/2020   AAA (abdominal aortic aneurysm) ([place]) 05/13/2020   CKD (chronic kidney disease) stage 3, GFR 30-59 ml/min ([place]) 04/16/2020   Poor dentition 04/15/2020   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 100 each 2   amLODipine (NORVASC) 5 MG tablet Take 1 tablet (5 mg total) by mouth daily. 90 tablet 2   aspirin 81 MG EC tablet Take 1 tablet (81 mg total) by mouth daily. 100 tablet 1   atorvastatin (LIPITOR) 10 MG tablet Take 1 tablet (10 mg total) by mouth daily. 90 tablet 3   chlorthalidone (HYGROTON) 25 MG tablet Take 1 tablet (25 mg total) by mouth daily. 90 tablet 2   dapagliflozin propanediol (FARXIGA) 10 MG TABS tablet Take 1 tablet (10 mg total) by mouth daily before breakfast. 90 tablet 3   fenofibrate micronized (LOFIBRA) 134 MG capsule Take 1 capsule (134 mg total) by mouth daily before breakfast. 90 capsule 3   gabapentin (NEURONTIN) 100 MG capsule Take 1 capsule (100 mg total) by mouth 2 (two) times daily. 180 capsule 2   insulin degludec (TRESIBA FLEXTOUCH) 200 UNIT/ML FlexTouch Pen Inject 70 Units into the skin daily in the afternoon. 45 mL 3   Insulin Pen Needle 31G X 5 MM MISC Use in the morning, at noon, in the evening, and at bedtime. 400 each 3   lactulose, encephalopathy, (CHRONULAC) 10 GM/15ML SOLN Take 15 mLs (10 g total) by mouth daily as needed (constipation). 480 mL 3   lisinopril (ZESTRIL) 2.5 MG tablet Take 1 tablet (2.5 mg total) by mouth daily. 90 tablet 3   NOVOLOG FLEXPEN 100 UNIT/ML FlexPen INJECT UNDER THE SKIN - 34 UNITS AT BREAKFAST, 34 UNITS AT LUNCH AND 24 UNITS AT [facility] (Patient  taking differently: INJECT UNDER THE SKIN - 32 UNITS AT BREAKFAST, 32 UNITS AT LUNCH AND 24 UNITS AT DINNER) 90 mL 3   pioglitazone (ACTOS) 15 MG tablet Take 1 tablet (15 mg total) by mouth daily. 90 tablet 2   Current Facility-Administered Medications on File Prior to Visit  Medication Dose Route Frequency Provider Last Rate Last Admin   glucose chewable tablet 4 g  1 tablet Oral Once Elsie Stain, MD       Allergies  Allergen Reactions   Bee Venom Swelling   Penicillins Rash      Objective: General: Patient is awake, alert, and  oriented x 3 and in no acute distress.  General Appearance  Alert, conversant and in no acute stress.   Vascular  Dorsalis pedis pulses are palpable  bilaterally. Posterior tibial pulses are absent  B/L. Capillary return is within normal limits  bilaterally. Temperature is within normal limits  bilaterally.   Neurologic  Senn-Weinstein monofilament wire test within normal limits  bilaterally. Muscle power within normal limits bilaterally.   Nails Thick disfigured discolored nails with subungual debris  from hallux to fifth toes bilaterally. No evidence of bacterial infection or drainage bilaterally.   Orthopedic  No limitations of motion  feet .  No crepitus or effusions noted.  No bony pathology or digital deformities noted. Mild  HAV  B/L.Muscle power and ROM  WNL.   Skin  normotropic skin with no porokeratosis noted bilaterally.  No signs of infections or ulcers noted.     Assessment and Plan:   ICD-10-CM   1. Pain due to onychomycosis of toenails of both feet  B35.1    M79.675    M79.674        -Examined patient. -Mechanically debrided all nails 1-5 bilateral using sterile nail nipper and filed with dremel without incident  -Answered all patient questions -Patient to return  in 3 months for at risk foot care -Patient advised to call the office if any problems or questions arise in the meantime.  Bronson Ing, DPM

## 2022-04-01 ENCOUNTER — Telehealth: Payer: Self-pay

## 2022-04-01 NOTE — Telephone Encounter (Signed)
Opened in error

## 2022-04-01 NOTE — Telephone Encounter (Signed)
I spoke to [employer] who stated that the patient has been approved for [facility] and is receiving care through [employer]

## 2022-04-02 ENCOUNTER — Encounter: Payer: Self-pay | Admitting: Critical Care Medicine

## 2022-04-02 ENCOUNTER — Ambulatory Visit: Payer: Medicare HMO | Attending: Critical Care Medicine | Admitting: Critical Care Medicine

## 2022-04-02 ENCOUNTER — Other Ambulatory Visit: Payer: Self-pay

## 2022-04-02 VITALS — BP 110/66 | HR 85 | Temp 98.1°F | Ht 67.0 in | Wt 257.4 lb

## 2022-04-02 DIAGNOSIS — J432 Centrilobular emphysema: Secondary | ICD-10-CM

## 2022-04-02 DIAGNOSIS — Z23 Encounter for immunization: Secondary | ICD-10-CM | POA: Diagnosis not present

## 2022-04-02 DIAGNOSIS — I10 Essential (primary) hypertension: Secondary | ICD-10-CM | POA: Diagnosis not present

## 2022-04-02 DIAGNOSIS — E1165 Type 2 diabetes mellitus with hyperglycemia: Secondary | ICD-10-CM | POA: Diagnosis not present

## 2022-04-02 DIAGNOSIS — N1831 Chronic kidney disease, stage 3a: Secondary | ICD-10-CM

## 2022-04-02 LAB — POCT GLYCOSYLATED HEMOGLOBIN (HGB A1C): Hemoglobin A1C: 7.6 % — AB (ref 4.0–5.6)

## 2022-04-02 MED ORDER — PIOGLITAZONE HCL 15 MG PO TABS: 15.0000 mg | ORAL_TABLET | Freq: Every day | ORAL | 2 refills | Status: DC

## 2022-04-02 MED ORDER — GABAPENTIN 100 MG PO CAPS: 100.0000 mg | ORAL_CAPSULE | Freq: Two times a day (BID) | ORAL | 2 refills | Status: DC

## 2022-04-02 MED ORDER — RSVPREF3 VAC RECOMB ADJUVANTED 120 MCG/0.5ML IM SUSR
0.5000 mL | Freq: Once | INTRAMUSCULAR | 0 refills | Status: AC
  Filled 2022-04-02: qty 0.5, 1d supply, fill #0

## 2022-04-02 MED ORDER — TRESIBA FLEXTOUCH 200 UNIT/ML ~~LOC~~ SOPN: 70.0000 [IU] | PEN_INJECTOR | Freq: Every day | SUBCUTANEOUS | 3 refills | Status: DC

## 2022-04-02 MED ORDER — INSULIN PEN NEEDLE 31G X 5 MM MISC: 1.0000 | Freq: Four times a day (QID) | 3 refills | Status: DC

## 2022-04-02 MED ORDER — DAPAGLIFLOZIN PROPANEDIOL 10 MG PO TABS: 10.0000 mg | ORAL_TABLET | Freq: Every day | ORAL | 3 refills | Status: DC

## 2022-04-02 MED ORDER — ATORVASTATIN CALCIUM 10 MG PO TABS: 10.0000 mg | ORAL_TABLET | Freq: Every day | ORAL | 3 refills | Status: DC

## 2022-04-02 MED ORDER — LISINOPRIL 2.5 MG PO TABS: 2.5000 mg | ORAL_TABLET | Freq: Every day | ORAL | 3 refills | Status: DC

## 2022-04-02 NOTE — Assessment & Plan Note (Signed)
Hypertension well controlled no changes made

## 2022-04-02 NOTE — Assessment & Plan Note (Signed)
Stable renal function.

## 2022-04-02 NOTE — Assessment & Plan Note (Signed)
Stable on inhaled medicines

## 2022-04-02 NOTE — Assessment & Plan Note (Signed)
Improved glycemic control no changes
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
## 2022-04-02 NOTE — Patient Instructions (Signed)
RSV vaccine will be given downstairs at the pharmacy  Flu vaccine was given  Return in 2 weeks for your first shingles vaccine and you will have a second vaccine 6 weeks later these will be nurse visits  Return to Dr. Joya Gaskins in December keep existing appointment  No change in medications refill sent on your prescriptions that are overdue

## 2022-04-04 DIAGNOSIS — R32 Unspecified urinary incontinence: Secondary | ICD-10-CM | POA: Diagnosis not present

## 2022-04-13 DIAGNOSIS — E1165 Type 2 diabetes mellitus with hyperglycemia: Secondary | ICD-10-CM | POA: Diagnosis not present

## 2022-04-14 DIAGNOSIS — E1165 Type 2 diabetes mellitus with hyperglycemia: Secondary | ICD-10-CM

## 2022-04-14 LAB — GLUCOSE, POCT (MANUAL RESULT ENTRY): POC Glucose: 115 mg/dl — AB (ref 70–99)

## 2022-04-14 NOTE — Congregational Nurse Program (Signed)
  Dept: [phone]   Congregational Nurse Program Note  Date of Encounter: 04/14/2022  Clinic visit at [facility] for blood pressure and blood glucose check.  BP 135/88, blood glucose 115.  States that he had not checked blood glucose this week due to continuous glucose monitor last supply being defective and he didn't have any strips for regular glucometer. Past Medical History: Past Medical History:  Diagnosis Date   Diabetes mellitus without complication ([place])    Hypertension    Stroke [facility])     Encounter Details:  CNP Questionnaire - 04/14/22 1700       Questionnaire   Do you give verbal consent to treat you today? Yes    Location Patient [place] or Organization    Patient Status Unknown   Has [facility]memt at [facility];Medicare    Insurance Referral N/A    Medication N/A    Medical Provider Yes    Screening Referrals N/A    Medical Referral N/A    Medical Appointment Made N/A    Food N/A    Transportation N/A    Housing/Utilities N/A    Interpersonal Safety N/A    Intervention Blood pressure;Blood glucose;Counsel;Support;Spiritual Care    ED Visit Averted N/A    Life-Saving Intervention Made N/A

## 2022-04-15 ENCOUNTER — Other Ambulatory Visit: Payer: Self-pay | Admitting: Critical Care Medicine

## 2022-04-15 ENCOUNTER — Other Ambulatory Visit: Payer: Self-pay

## 2022-04-15 ENCOUNTER — Other Ambulatory Visit: Payer: Self-pay | Admitting: Pharmacist

## 2022-04-15 MED ORDER — ACCU-CHEK SOFTCLIX LANCETS MISC
2 refills | Status: AC
  Filled 2022-04-15 – 2022-12-21 (×2): qty 100, 33d supply, fill #0

## 2022-04-15 MED ORDER — ACCU-CHEK GUIDE W/DEVICE KIT
PACK | 0 refills | Status: DC
  Filled 2022-04-15: qty 1, 30d supply, fill #0

## 2022-04-15 MED ORDER — ACCU-CHEK GUIDE VI STRP
ORAL_STRIP | 2 refills | Status: DC
  Filled 2022-04-15: qty 100, 33d supply, fill #0

## 2022-04-15 MED ORDER — SHINGRIX 50 MCG/0.5ML IM SUSR
0.5000 mL | Freq: Once | INTRAMUSCULAR | 1 refills | Status: AC
  Filled 2022-04-15: qty 1, 1d supply, fill #0

## 2022-04-17 ENCOUNTER — Encounter: Payer: Self-pay | Admitting: Internal Medicine

## 2022-04-17 ENCOUNTER — Other Ambulatory Visit: Payer: Self-pay

## 2022-04-17 ENCOUNTER — Ambulatory Visit (INDEPENDENT_AMBULATORY_CARE_PROVIDER_SITE_OTHER): Payer: Medicare HMO | Admitting: Internal Medicine

## 2022-04-17 VITALS — BP 114/76 | HR 84 | Ht 67.0 in | Wt 257.0 lb

## 2022-04-17 DIAGNOSIS — N1832 Chronic kidney disease, stage 3b: Secondary | ICD-10-CM | POA: Diagnosis not present

## 2022-04-17 DIAGNOSIS — E1122 Type 2 diabetes mellitus with diabetic chronic kidney disease: Secondary | ICD-10-CM

## 2022-04-17 DIAGNOSIS — Z794 Long term (current) use of insulin: Secondary | ICD-10-CM

## 2022-04-17 DIAGNOSIS — E1165 Type 2 diabetes mellitus with hyperglycemia: Secondary | ICD-10-CM

## 2022-04-17 DIAGNOSIS — E1142 Type 2 diabetes mellitus with diabetic polyneuropathy: Secondary | ICD-10-CM | POA: Diagnosis not present

## 2022-04-17 MED ORDER — PIOGLITAZONE HCL 15 MG PO TABS: 15.0000 mg | ORAL_TABLET | Freq: Every day | ORAL | 3 refills | Status: DC

## 2022-04-17 MED ORDER — NOVOLOG FLEXPEN 100 UNIT/ML ~~LOC~~ SOPN: PEN_INJECTOR | SUBCUTANEOUS | 3 refills | Status: DC

## 2022-04-17 MED ORDER — INSULIN PEN NEEDLE 31G X 5 MM MISC: 1.0000 | Freq: Four times a day (QID) | 3 refills | Status: DC

## 2022-04-17 MED ORDER — ACCU-CHEK GUIDE VI STRP: 1.0000 | ORAL_STRIP | Freq: Three times a day (TID) | 3 refills | Status: DC

## 2022-04-17 MED ORDER — TRESIBA FLEXTOUCH 200 UNIT/ML ~~LOC~~ SOPN: 70.0000 [IU] | PEN_INJECTOR | Freq: Every day | SUBCUTANEOUS | 3 refills | Status: DC

## 2022-04-17 NOTE — Patient Instructions (Signed)
-   Continue Pioglitazone (Actos) 15 mg daily  - Continue farxiga 10 tablet every morning  - Continue Tresiba 70 units once daily  - Continue   Novolog 32 units with Breakfast, 32 units with Lunch and 24 units with Supper      HOW TO TREAT LOW BLOOD SUGARS (Blood sugar LESS THAN 70 MG/DL) Please follow the RULE OF 15 for the treatment of hypoglycemia treatment (when your (blood sugars are less than 70 mg/dL)   STEP 1: Take 15 grams of carbohydrates when your blood sugar is low, which includes:  3-4 GLUCOSE TABS  OR 3-4 OZ OF JUICE OR REGULAR SODA OR ONE TUBE OF GLUCOSE GEL    STEP 2: RECHECK blood sugar in 15 MINUTES STEP 3: If your blood sugar is still low at the 15 minute recheck --> then, go back to STEP 1 and treat AGAIN with another 15 grams of carbohydrates.

## 2022-04-17 NOTE — Progress Notes (Signed)
Name: Reginald Rodriguez  MRN/ DOB: [id number], 26-May-1951   Age/ Sex: 71 y.o., male    PCP: Elsie Stain, MD   Reason for Endocrinology Evaluation: Type 2 Diabetes Mellitus     Date of Initial Endocrinology Visit: 03/12/2021    PATIENT IDENTIFIER: [name] is a 71 y.o. male with a past medical history of DM, HTN , COPD , Hx of cocaine abuse and Dyslipidemia. The patient presented for initial endocrinology clinic visit on 03/12/2021 for consultative assistance with his diabetes management.     DIABETIC HISTORY:  [name] was diagnosed with DM many years ago. His hemoglobin A1c has ranged from 7.7%, peaking at 9.7% in 2021  He is intolerant to higher doses of Trulicity   On his initial visit to our clinic his A1c was 9.2 % we started farxiga, continued Trulicity and Basal/ Prandial regimen   Trulicity was stopped 01/8294 as he could only tolerate the smallest dose and we opted to start pioglitazone  SUBJECTIVE:   During the last visit (06/16/2021): A1c 8.1 %      Today (04/17/22): [name] is here for a follow up on diabetes management.   He checks his sugars blood sugars 3-4 times daily. The patient has had hypoglycemic episodes since the last clinic visit.  Dexcom is cost prohibitive , using freestyle libre  Denies nausea vomiting, or diarrhea  No LE edema      HOME DIABETES REGIMEN: Tresiba 70 units daily at night  Novolog 32 units with breakfast, 32 units with lunch, and 24 units with supper Pioglitazone 15 mg daily  Farxiga 10 mg daily       Statin: yes ACE-I/ARB: yes   METER DOWNLOAD SUMMARY: unable to download  Yesterday  am 135       DIABETIC COMPLICATIONS: Microvascular complications:  CKD III, neuropathy  Denies: retinopathy  Last eye exam: Completed 2022  Macrovascular complications:  CVA ( Right hemiparesis )  Denies: CAD, PVD   PAST HISTORY: Past Medical History:  Past Medical History:  Diagnosis Date   Diabetes mellitus  without complication ([place])    Hypertension    Stroke [facility])    Past Surgical History: No past surgical history on file.  Social History:  reports that he has been smoking cigarettes. He has a 46.00 pack-year smoking history. He has quit using smokeless tobacco. He reports that he does not currently use alcohol. He reports that he does not currently use drugs. Family History: No family history on file.   HOME MEDICATIONS: Allergies as of 04/17/2022       Reactions   Bee Venom Swelling   Penicillins Rash        Medication List        Accurate as of April 17, 2022  9:46 AM. If you have any questions, ask your nurse or doctor.          Accu-Chek Guide test strip Generic drug: glucose blood Use to check blood sugar 3 times daily.   Accu-Chek Guide w/Device Kit Use to check blood sugar 3 times daily.   Accu-Chek Softclix Lancets lancets Use to check blood sugar 3 times daily.   amLODipine 5 MG tablet Commonly known as: NORVASC Take 1 tablet (5 mg total) by mouth daily.   aspirin EC 81 MG tablet Take 1 tablet (81 mg total) by mouth daily.   atorvastatin 10 MG tablet Commonly known as: LIPITOR Take 1 tablet (10 mg total) by mouth daily.   chlorthalidone 25  MG tablet Commonly known as: HYGROTON Take 1 tablet (25 mg total) by mouth daily.   dapagliflozin propanediol 10 MG Tabs tablet Commonly known as: Farxiga Take 1 tablet (10 mg total) by mouth daily before breakfast.   fenofibrate micronized 134 MG capsule Commonly known as: LOFIBRA Take 1 capsule (134 mg total) by mouth daily before breakfast.   gabapentin 100 MG capsule Commonly known as: NEURONTIN Take 1 capsule (100 mg total) by mouth 2 (two) times daily.   Insulin Pen Needle 31G X 5 MM Misc Use in the morning, at noon, in the evening, and at bedtime.   lactulose (encephalopathy) 10 GM/15ML Soln Commonly known as: CHRONULAC Take 15 mLs (10 g total) by mouth daily as needed (constipation).    lisinopril 2.5 MG tablet Commonly known as: ZESTRIL Take 1 tablet (2.5 mg total) by mouth daily.   NovoLOG FlexPen 100 UNIT/ML FlexPen Generic drug: insulin aspart INJECT UNDER THE SKIN - 34 UNITS AT BREAKFAST, 34 UNITS AT LUNCH AND 24 UNITS AT DINNER What changed: additional instructions   pioglitazone 15 MG tablet Commonly known as: Actos Take 1 tablet (15 mg total) by mouth daily.   [name] FlexTouch 200 UNIT/ML FlexTouch Pen Generic drug: insulin degludec Inject 70 Units into the skin daily in the afternoon.         ALLERGIES: Allergies  Allergen Reactions   Bee Venom Swelling   Penicillins Rash     OBJECTIVE:   VITAL SIGNS: BP 114/76 (BP Location: Left Arm, Patient Position: Sitting, Cuff Size: Large)   Pulse 84   Ht _0  (1.702 m)   Wt 257 lb (116.6 kg)   SpO2 96%   BMI 40.25 kg/m    PHYSICAL EXAM:  General: Pt appears well and is in NAD with a walker  Neck: General: Supple without adenopathy or carotid bruits. Thyroid: Thyroid size normal.  No goiter or nodules appreciated.  Lungs: Clear with good BS bilat with no rales, rhonchi, or wheezes  Heart: RRR with normal S1 and S2 and no gallops; no murmurs; no rub  Extremities:  Lower extremities - No pretibial edema. No lesions.  Neuro: MS is good with appropriate affect, pt is alert and Ox3    DM Foot Exam 06/16/2021 The skin of the feet is ithout sores or ulcerations. The pedal pulses are 1+ on right and 1+ on left. The sensation is intact to a screening 5.07, 10 gram monofilament bilaterally   DATA REVIEWED:  Lab Results  Component Value Date   HGBA1C 7.6 (A) 04/02/2022   HGBA1C 8.1 (A) 10/15/2021   HGBA1C 8.4 (A) 06/16/2021   Lab Results  Component Value Date   LDLCALC 50 12/17/2020   CREATININE 1.59 (H) 09/30/2021   Elevated  ASSESSMENT / PLAN / RECOMMENDATIONS:   1) Type 2 Diabetes Mellitus, with improving glycemic control , With Neuropathic, CKD III and macrovascular  complications  - Most recent A1c of 7.6 %. Goal A1c < 7.0 %.    - A1c continues to improve  - He is having difficulty with freestyle libre sensors at times  - Dexcom has been cost prohibitive  - No side effects to pioglitazone   MEDICATIONS:   Continue pioglitazone 15 mg daily Continue farxiga  10 tablet every morning  Continue Tresiba 70 units once daily  Continue Novolog 32 units with Breakfast, 32 units with Lunch and 24 units with Supper    EDUCATION / INSTRUCTIONS: BG monitoring instructions: Patient is instructed to check his blood sugars  3 times a day, before meals. Call [facility] if: BG persistently < 70  I reviewed the Rule of 15 for the treatment of hypoglycemia in detail with the patient. Literature supplied.   2) Diabetic complications:  Eye: Does not have known diabetic retinopathy.  Neuro/ Feet: Does  have known diabetic peripheral neuropathy. Renal: Patient does  have known baseline CKD. He is  on an ACEI/ARB at present.     F/U in 6 months     Signed electronically by: Mack Guise, MD  [facility]  [address] [place], [place] [address] Phone: [phone] FAX: [phone]   CC: Elsie Stain, MD [address] [place] [address] Phone: [phone]  Fax: [phone]    Return to Endocrinology clinic as below: Future Appointments  Date Time Provider [place]  05/29/2022  9:40 AM GI-315 CT 1 GI-315[facility] [address] [facility]  06/18/2022  9:45 AM Stand[place], Nena Alexander, DPM [facility]  06/23/2022  9:10 AM Elsie Stain, MD [facility] None

## 2022-04-28 NOTE — Congregational Nurse Program (Signed)
  Dept: [phone]   Congregational Nurse Program Note  Date of Encounter: 04/28/2022  Clinic visit for blood pressure check, BP 125/85, pulse 98 and regular, lungs clear.  States blood glucose 157 prior to coming to clinic and he has taken his afternoon insulin.  Weight is 257 Lbs per MD visit, discussed weight loss strategies and benefits of stopping smoking for both managing hypertension and preventing another stroke. Past Medical History: Past Medical History:  Diagnosis Date   Diabetes mellitus without complication ([place])    Hypertension    Stroke [facility])     Encounter Details:  CNP Questionnaire - 04/28/22 1745       Questionnaire   Ask client: Do you give verbal consent for me to treat you today? Yes    Student Assistance N/A    Location Patient [place]    Visit Setting with Client Organization    Patient Status Unknown   Has apartment at [facility];Medicare    Insurance/Financial Assistance Referral N/A    Medication N/A    Medical Provider Yes    Screening Referrals Made N/A    Medical Referrals Made N/A    Medical Appointment Made N/A    Recently w/o PCP, now 1st time PCP visit completed due to CNs referral or appointment made N/A    Food N/A    Transportation Need transportation assistance    Housing/Utilities N/A    Interpersonal Safety N/A    Interventions Advocate/Support;Spiritual Care;Educate;Counsel    Abnormal to Normal Screening Since Last CN Visit N/A    Screenings CN Performed Blood Pressure    Sent Client to Lab for: N/A    Did client attend any of the following based off CNs referral or appointments made? N/A    ED Visit Averted N/A    Life-Saving Intervention Made N/A      Questionnaire   Do you give verbal consent to treat you today? Yes    Location Patient Served  Not Applicable    Visit Setting Church or Organization    Patient Status Unknown    Insurance Medicaid;Medicare    Insurance  Referral N/A    Medication N/A    Screening Referrals N/A    Intervention Blood pressure;Educate;Spiritual Care;Support;Counsel

## 2022-05-03 IMAGING — CT CT CHEST LUNG CANCER SCREENING LOW DOSE W/O CM
1 series · 15 of 31 positions shown, 19 images · non-contrast
Comparison: None.

CLINICAL DATA: 62-year-old male with 46 pack-year history of
smoking. Lung cancer screening.

EXAM:
CT CHEST WITHOUT CONTRAST LOW-DOSE FOR LUNG CANCER SCREENING
TECHNIQUE: Multidetector CT imaging of the chest was performed following the
standard protocol without IV contrast.

[Series 2: ldct screen -soft · axial · 0.79mm/px · z∈[-347,-42]mm · 15 of 67 slices shown, 19 images]
[im 3/67  mediastinal]
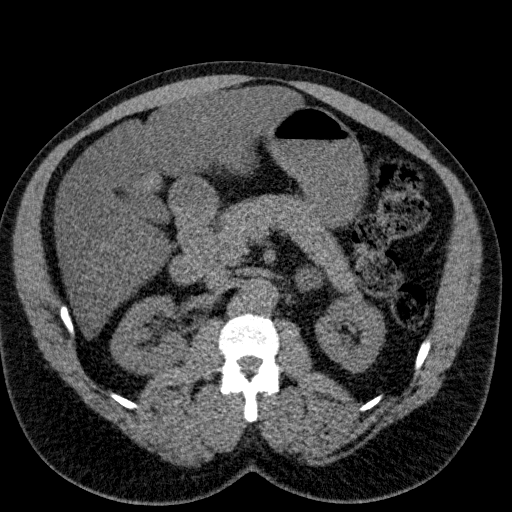
[im 3/67  lung]
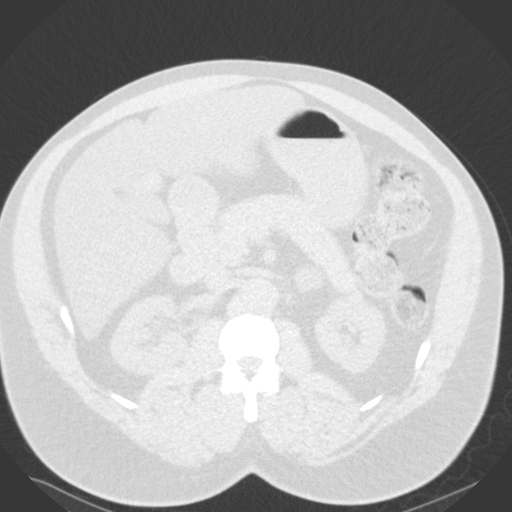
[im 8/67  lung]
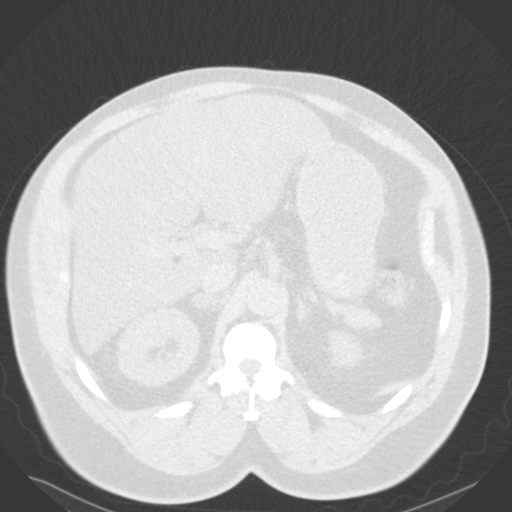
[im 13/67  lung]
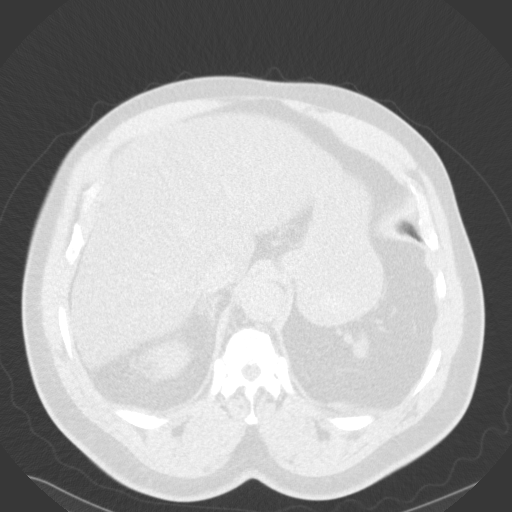
[im 15/67  lung]
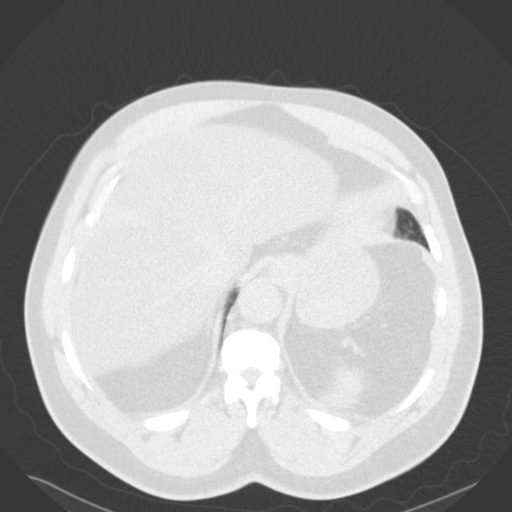
[im 20/67  mediastinal]
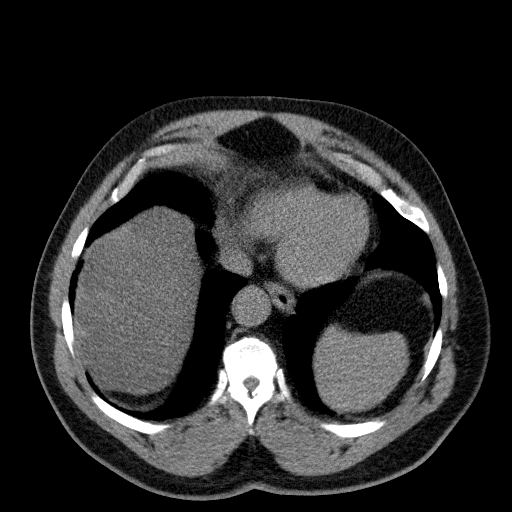
[im 20/67  lung]
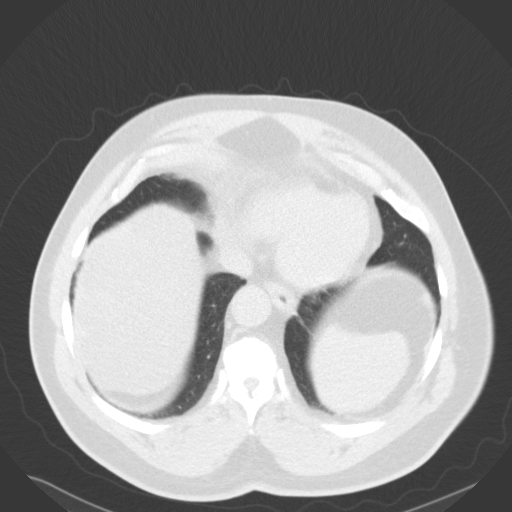
[im 25/67  lung]
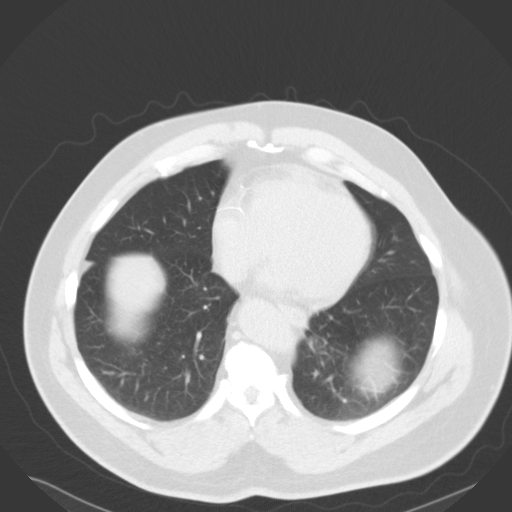
[im 30/67  lung]
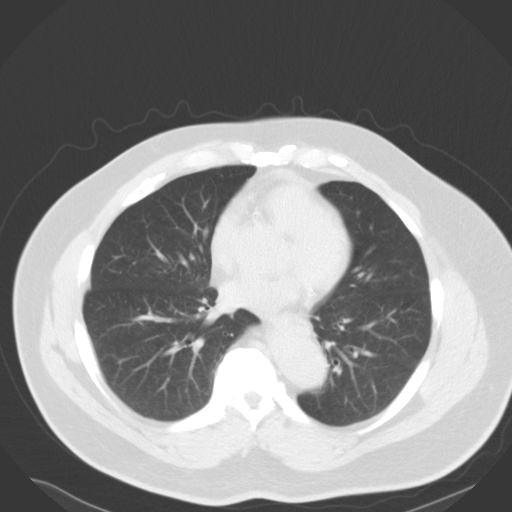
[im 35/67  lung]
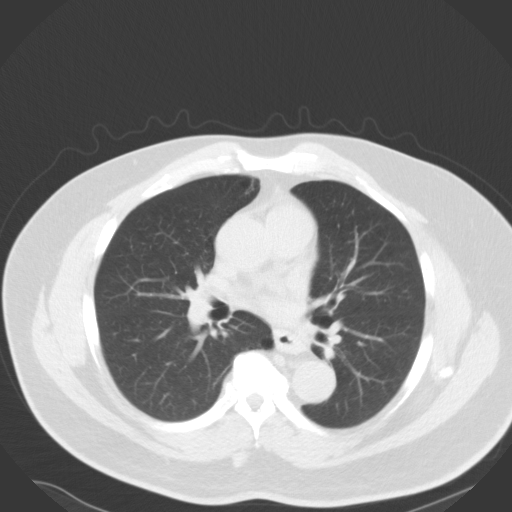
[im 37/67  mediastinal]
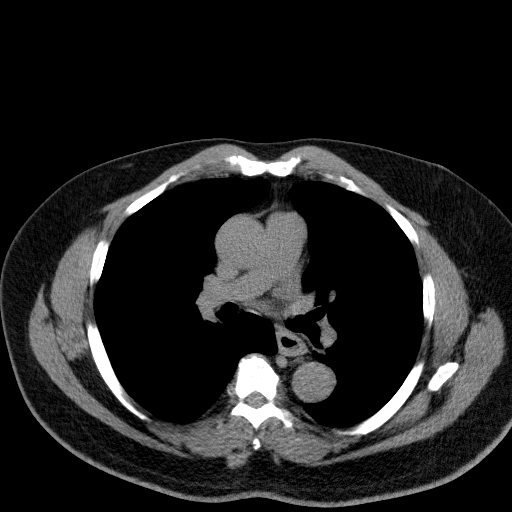
[im 37/67  lung]
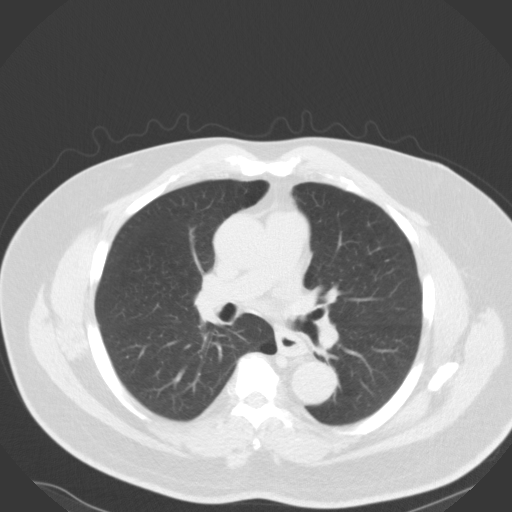
[im 42/67  lung]
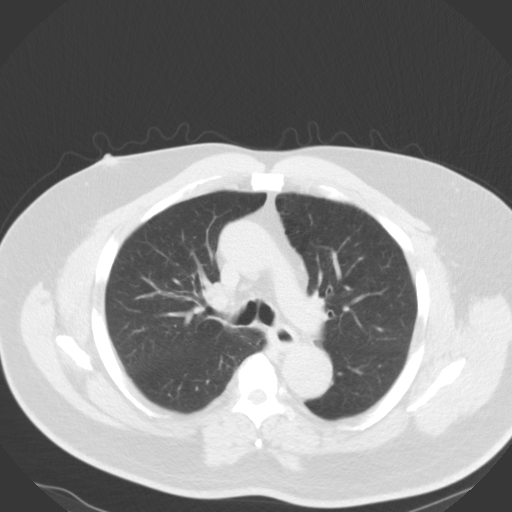
[im 47/67  lung]
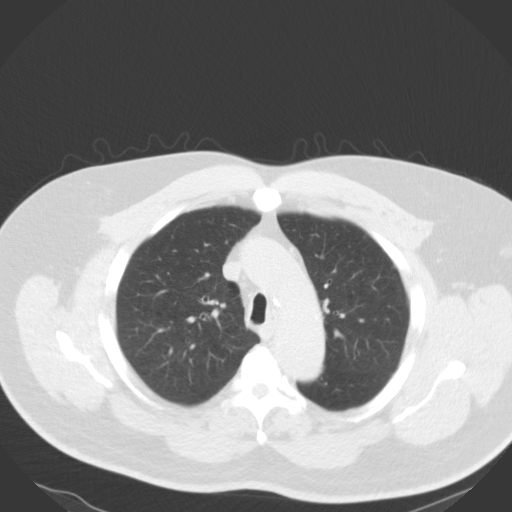
[im 52/67  lung]
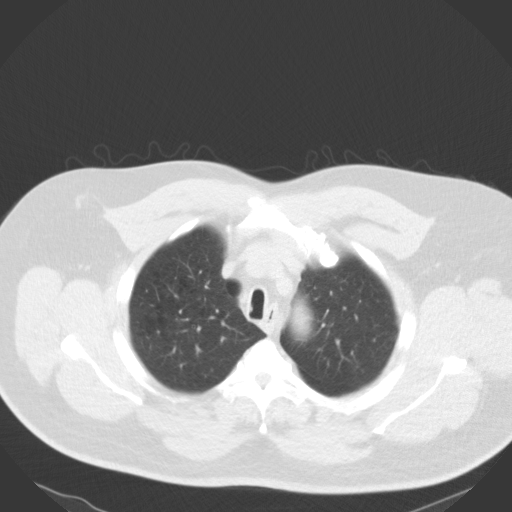
[im 54/67  mediastinal]
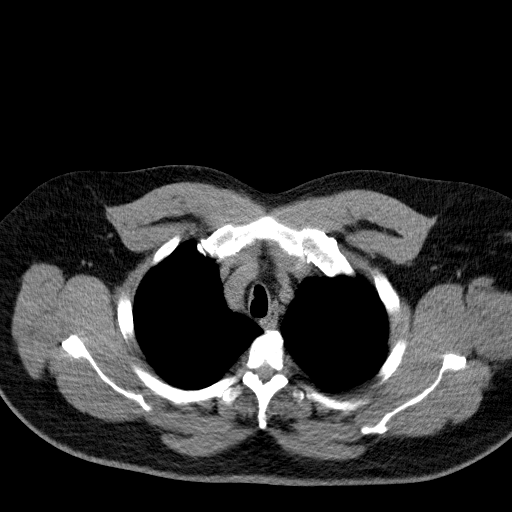
[im 54/67  lung]
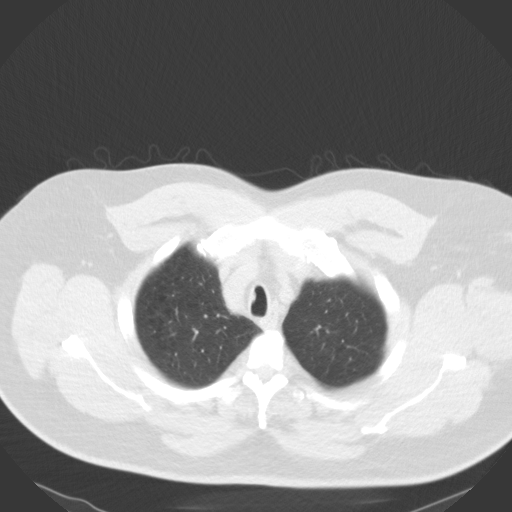
[im 59/67  lung]
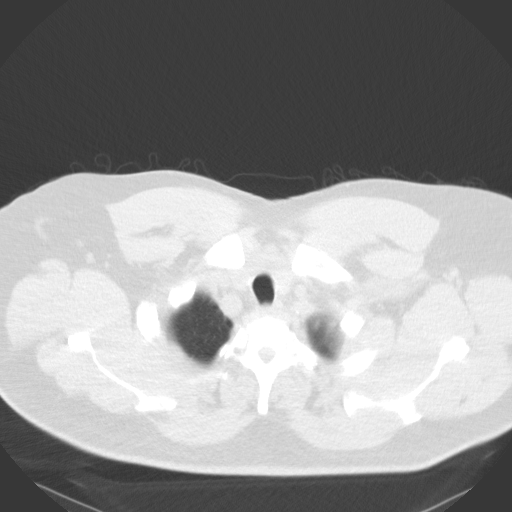
[im 64/67  lung]
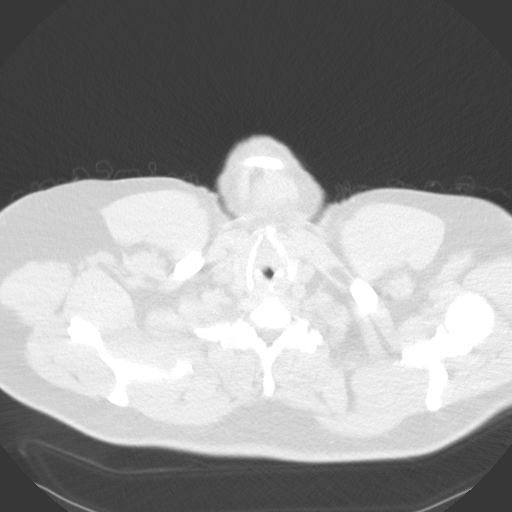

[15 of 31 positions shown; findings below may reference images not displayed]

FINDINGS: Cardiovascular: The heart size is normal. No substantial pericardial
effusion. Mild atherosclerotic calcification is noted in the wall of
the thoracic aorta.

Mediastinum/Nodes: No mediastinal lymphadenopathy. No evidence for
gross hilar lymphadenopathy although assessment is limited by the
lack of intravenous contrast on the current study. The esophagus has
normal imaging features. There is no axillary lymphadenopathy.

Lungs/Pleura: Centrilobular and paraseptal emphysema evident.
Scattered tiny bilateral pulmonary nodules identified measuring up
to maximum volume derived equivalent diameter of 2.9 mm. No
suspicious nodule or mass. No focal airspace consolidation. No
pleural effusion.

Upper Abdomen: The liver shows diffusely decreased attenuation
suggesting fat deposition. 2.3 cm left adrenal nodule has low
attenuation consistent with adenoma. 19 mm adenoma noted right
adrenal gland.

Musculoskeletal: No worrisome lytic or sclerotic osseous
abnormality.
IMPRESSION: 1. Lung-RADS 2, benign appearance or behavior. Continue annual
screening with low-dose chest CT without contrast in 12 months.
2. Bilateral adrenal adenomas.
3. Hepatic steatosis.
4. Aortic Atherosclerosis (AOJ7J-GF9.9) and Emphysema (AOJ7J-6N4.K).

## 2022-05-06 ENCOUNTER — Emergency Department (HOSPITAL_COMMUNITY): Payer: Medicare HMO

## 2022-05-06 ENCOUNTER — Other Ambulatory Visit: Payer: Self-pay

## 2022-05-06 ENCOUNTER — Emergency Department (HOSPITAL_COMMUNITY)
Admission: EM | Admit: 2022-05-06 | Discharge: 2022-05-06 | Disposition: A | Discharge status: Home / Self Care | Payer: Medicare HMO | Attending: Emergency Medicine | Admitting: Emergency Medicine

## 2022-05-06 ENCOUNTER — Encounter (HOSPITAL_COMMUNITY): Payer: Self-pay

## 2022-05-06 DIAGNOSIS — N189 Chronic kidney disease, unspecified: Secondary | ICD-10-CM | POA: Diagnosis not present

## 2022-05-06 DIAGNOSIS — R079 Chest pain, unspecified: Secondary | ICD-10-CM | POA: Insufficient documentation

## 2022-05-06 DIAGNOSIS — R55 Syncope and collapse: Secondary | ICD-10-CM | POA: Diagnosis not present

## 2022-05-06 DIAGNOSIS — F17210 Nicotine dependence, cigarettes, uncomplicated: Secondary | ICD-10-CM | POA: Insufficient documentation

## 2022-05-06 DIAGNOSIS — R059 Cough, unspecified: Secondary | ICD-10-CM | POA: Insufficient documentation

## 2022-05-06 DIAGNOSIS — Z794 Long term (current) use of insulin: Secondary | ICD-10-CM | POA: Diagnosis not present

## 2022-05-06 DIAGNOSIS — Z79899 Other long term (current) drug therapy: Secondary | ICD-10-CM | POA: Insufficient documentation

## 2022-05-06 DIAGNOSIS — R42 Dizziness and giddiness: Secondary | ICD-10-CM | POA: Diagnosis not present

## 2022-05-06 DIAGNOSIS — Z7982 Long term (current) use of aspirin: Secondary | ICD-10-CM | POA: Insufficient documentation

## 2022-05-06 DIAGNOSIS — R531 Weakness: Secondary | ICD-10-CM | POA: Diagnosis not present

## 2022-05-06 DIAGNOSIS — J449 Chronic obstructive pulmonary disease, unspecified: Secondary | ICD-10-CM | POA: Diagnosis not present

## 2022-05-06 DIAGNOSIS — I129 Hypertensive chronic kidney disease with stage 1 through stage 4 chronic kidney disease, or unspecified chronic kidney disease: Secondary | ICD-10-CM | POA: Diagnosis not present

## 2022-05-06 DIAGNOSIS — I10 Essential (primary) hypertension: Secondary | ICD-10-CM | POA: Diagnosis not present

## 2022-05-06 NOTE — Discharge Instructions (Signed)
Evaluation for your cough is overall reassuring.  Recommend conservative treatment at home which includes Tylenol and ibuprofen as needed for your symptoms.  Also recommend hot showers.  If your symptoms persist recommend following up with your PCP.  If you have new shortness of breath or chest pain, nausea or sweating please return to emergency department for further evaluation.

## 2022-05-06 NOTE — ED Provider Notes (Signed)
[facility] EMERGENCY DEPARTMENT Provider Note   CSN: [id number] Arrival date & time: 05/06/22  1037     History  Chief Complaint  Patient presents with   Cough   HPI Reginald Rodriguez is a 71 y.o. male with hypertension, hyperlipidemia, CKD, and COPD presenting for cough.  States that he went out of town to visit family this past weekend.  When he returned started to have a cough.  Cough is nonproductive.  Denies shortness of breath.  States that he has had some chest pain with the cough.  Chest pain only occurs when he is coughing.  Chest pain not worse with exertion. at this time he is no longer endorsing chest pain.  States that his symptoms have improved since he has been here in the emergency department.  Also mentioned that he does smoke 6 to 7 cigarettes a day.    Cough      Home Medications Prior to Admission medications   Medication Sig Start Date End Date Taking? Authorizing Provider  Accu-Chek Softclix Lancets lancets Use to check blood sugar 3 times daily. 04/15/22   Elsie Stain, MD  amLODipine (NORVASC) 5 MG tablet Take 1 tablet (5 mg total) by mouth daily. 12/31/21   Elsie Stain, MD  aspirin 81 MG EC tablet Take 1 tablet (81 mg total) by mouth daily. 08/27/20   Elsie Stain, MD  atorvastatin (LIPITOR) 10 MG tablet Take 1 tablet (10 mg total) by mouth daily. 04/02/22   Elsie Stain, MD  Blood Glucose Monitoring Suppl (ACCU-CHEK GUIDE) w/Device KIT Use to check blood sugar 3 times daily. 04/15/22   Elsie Stain, MD  chlorthalidone (HYGROTON) 25 MG tablet Take 1 tablet (25 mg total) by mouth daily. 12/31/21   Elsie Stain, MD  dapagliflozin propanediol (FARXIGA) 10 MG TABS tablet Take 1 tablet (10 mg total) by mouth daily before breakfast. 04/02/22   Elsie Stain, MD  fenofibrate micronized (LOFIBRA) 134 MG capsule Take 1 capsule (134 mg total) by mouth daily before breakfast. 12/31/21 12/26/22  Elsie Stain, MD  gabapentin  (NEURONTIN) 100 MG capsule Take 1 capsule (100 mg total) by mouth 2 (two) times daily. 04/02/22 04/02/23  Elsie Stain, MD  glucose blood (ACCU-CHEK GUIDE) test strip 1 each by Other route 3 (three) times daily. Use to check blood sugar 3 times daily. 04/17/22   Shamleffer, Melanie Crazier, MD  insulin degludec (TRESIBA FLEXTOUCH) 200 UNIT/ML FlexTouch Pen Inject 70 Units into the skin daily in the afternoon. 04/17/22   Shamleffer, Melanie Crazier, MD  Insulin Pen Needle 31G X 5 MM MISC Use in the morning, at noon, in the evening, and at bedtime. 04/17/22   Shamleffer, Melanie Crazier, MD  lactulose, encephalopathy, (CHRONULAC) 10 GM/15ML SOLN Take 15 mLs (10 g total) by mouth daily as needed (constipation). 06/16/21   Elsie Stain, MD  lisinopril (ZESTRIL) 2.5 MG tablet Take 1 tablet (2.5 mg total) by mouth daily. 04/02/22   Elsie Stain, MD  NOVOLOG FLEXPEN 100 UNIT/ML FlexPen Max daily 90 units 04/17/22   Shamleffer, Melanie Crazier, MD  pioglitazone (ACTOS) 15 MG tablet Take 1 tablet (15 mg total) by mouth daily. 04/17/22   Shamleffer, Melanie Crazier, MD      Allergies    Bee venom and Penicillins    Review of Systems   Review of Systems  Respiratory:  Positive for cough.     Physical Exam Updated Vital Signs BP Marland Kitchen)  120/92   Pulse 96   Temp 98.1 F (36.7 C)   Resp 18   Ht _0  (1.702 m)   Wt 116.6 kg   SpO2 98%   BMI 40.25 kg/m  Physical Exam Vitals and nursing note reviewed.  HENT:     Head: Normocephalic and atraumatic.     Mouth/Throat:     Mouth: Mucous membranes are moist.  Eyes:     General:        Right eye: No discharge.        Left eye: No discharge.     Conjunctiva/sclera: Conjunctivae normal.  Cardiovascular:     Rate and Rhythm: Normal rate and regular rhythm.     Pulses: Normal pulses.     Heart sounds: Normal heart sounds.  Pulmonary:     Effort: Pulmonary effort is normal.     Breath sounds: Normal breath sounds.  Abdominal:     General:  Abdomen is flat.     Palpations: Abdomen is soft.  Skin:    General: Skin is warm and dry.  Neurological:     General: No focal deficit present.  Psychiatric:        Mood and Affect: Mood normal.     ED Results / Procedures / Treatments   Labs (all labs ordered are listed, but only abnormal results are displayed) Labs Reviewed - No data to display  EKG EKG Interpretation  Date/Time:  Wednesday May 06 2022 11:06:08 EDT Ventricular Rate:  107 PR Interval:  162 QRS Duration: 88 QT Interval:  346 QTC Calculation: 461 R Axis:   17 Text Interpretation: Sinus tachycardia Nonspecific T wave abnormality Abnormal ECG When compared with ECG of 23-May-2021 17:44, PREVIOUS ECG IS PRESENT Confirmed by Dene Gentry [phone]) on 05/06/2022 12:25:12 PM  Radiology DG Chest 2 View  Result Date: 05/06/2022 CLINICAL DATA:  Cough. Smoker. EXAM: CHEST - 2 VIEW COMPARISON:  05/23/2021 FINDINGS: Normal sized heart. Tortuous and partially calcified thoracic aorta. Clear lungs with normal vascularity. Mild thoracic spine degenerative changes. IMPRESSION: No acute abnormality. Electronically Signed   By: Claudie Revering M.D.   On: 05/06/2022 11:47    Procedures Procedures    Medications Ordered in ED Medications - No data to display  ED Course/ Medical Decision Making/ A&P                           Medical Decision Making Amount and/or Complexity of Data Reviewed Radiology: ordered.   Patient presenting for cough and associated chest pain.  Differential diagnosis for this complaint ACS, PE, upper respiratory infection, and COPD.  Doubt ACS, given chest pain only occurred with coughing and was not exertional and EKG is nonischemic.  Considered PE but unlikely given patient denies shortness of breath, was initially tachycardic but now normal heart rate.  Also considered upper respiratory infection, which is likely contributing to his symptoms in setting of his COPD.  Discussed conservative  treatment at home.  Upon reevaluation patient stated that he was "ready to leave" and was feeling much better.  I evaluated this patient in triage and in the core and did not witness him cough during either encounter.        Final Clinical Impression(s) / ED Diagnoses Final diagnoses:  Cough, unspecified type    Rx / DC Orders ED Discharge Orders     None         Harriet Pho, PA-C 05/06/22 1243  Valarie Merino, MD 05/06/22 1610

## 2022-05-06 NOTE — ED Triage Notes (Signed)
Complains  of coming back from visiting family.  Complains of cough, dizziness and like he is going to pass out when he coughs.  Started this am.  No SOB, Complains of central chest pain with cough.Initially hypertension but now 124/86 HR108 100% RR 24 CBG 136

## 2022-05-06 NOTE — ED Provider Triage Note (Signed)
Emergency Medicine Provider Triage Evaluation Note  Reginald Rodriguez , a 71 y.o. male  was evaluated in triage.  Pt complains of cough.  Symptoms started on Monday.  Endorses a nonproductive cough.  States that he went to [place] over the weekend and when he returned his symptoms started.  Patient states he smokes 6 to 7 cigarettes a day.  Endorses chest pain.  Chest pain only occurs when he is coughing.  Denies shortness of breath.  States that he feels like something is stuck in his throat but he cannot cough it up.  Review of Systems  Positive: See above Negative: See above  Physical Exam  BP 119/88 (BP Location: Right Arm)   Pulse (!) 106   Temp 98.9 F (37.2 C) (Oral)   Resp 18   Ht '5\' 7"'$  (1.702 m)   Wt 116.6 kg   SpO2 93%   BMI 40.25 kg/m  Gen:   Awake, no distress   Resp:  Normal effort  MSK:   Moves extremities without difficulty  Other:    Medical Decision Making  Medically screening exam initiated at 11:04 AM.  Appropriate orders placed.  [name] was informed that the remainder of the evaluation will be completed by another provider, this initial triage assessment does not replace that evaluation, and the importance of remaining in the ED until their evaluation is complete.  Work-up initiated   Harriet Pho, PA-C 05/06/22 1107

## 2022-05-07 ENCOUNTER — Telehealth: Payer: Self-pay | Admitting: Licensed Clinical Social Worker

## 2022-05-07 NOTE — Patient Instructions (Signed)
Visit Information  Thank you for taking time to visit with me today. Please don't hesitate to contact me if I can be of assistance to you.   Following are the goals we discussed today:   Goals Addressed             This Visit's Progress    COMPLETED: Care Coordination Activities-No Follow Up Required       Care Coordination Interventions: Active listening / Reflection utilized  Emotional Support Provided Patient reports he is taking OTC Tylenol as recommended by ED staff to manage symptoms Pt reports he wants to wait before scheduling f/up appt with PCP. Has a scheduled appt on 06/23/22 Pt has a nurse that visits M, W, and Fridays. Feels very supported LCSW informed patient of care coordination services. Pt is not interested at this time and agreed to contact PCP, should needs arise         If you are experiencing a Mental Health or [place] or need someone to talk to, please call the Suicide and Crisis Lifeline: 988 call 911   The patient verbalized understanding of instructions, educational materials, and care plan provided today and DECLINED offer to receive copy of patient instructions, educational materials, and care plan.   No further follow up required:    Christa See, MSW, [email or web address] Phone [phone] 5:35 PM

## 2022-05-07 NOTE — Patient Outreach (Signed)
  Care Coordination   Initial Visit Note   05/07/2022 Name: Reginald Rodriguez MRN: [id number] DOB: Mar 30, 1951  Reginald Rodriguez is a 71 y.o. year old male who sees Elsie Stain, MD for primary care. I spoke with  [name] by phone today.  What matters to the patients health and wellness today?  Care Coordination Services    Goals Addressed             This Visit's Progress    COMPLETED: Care Coordination Activities-No Follow Up Required       Care Coordination Interventions: Active listening / Reflection utilized  Emotional Support Provided Patient reports he is taking OTC Tylenol as recommended by ED staff to manage symptoms Pt reports he wants to wait before scheduling f/up appt with PCP. Has a scheduled appt on 06/23/22 Pt has a nurse that visits M, W, and Fridays. Feels very supported LCSW informed patient of care coordination services. Pt is not interested at this time and agreed to contact PCP, should needs arise          SDOH assessments and interventions completed:  No     Care Coordination Interventions Activated:  Yes  Care Coordination Interventions:  Yes, provided   Follow up plan: No further intervention required.   Encounter Outcome:  Pt. Refused   Christa See, MSW, [email or web address] Phone [phone] 5:34 PM

## 2022-05-08 ENCOUNTER — Telehealth: Payer: Self-pay | Admitting: Critical Care Medicine

## 2022-05-08 NOTE — Telephone Encounter (Signed)
Pt is calling to requesting that [facility] is requesting permission for them to place a PT valve. [place] [facility]- [phone]

## 2022-05-12 ENCOUNTER — Other Ambulatory Visit: Payer: Self-pay | Admitting: Physician Assistant

## 2022-05-12 DIAGNOSIS — Z789 Other specified health status: Secondary | ICD-10-CM

## 2022-05-12 NOTE — Congregational Nurse Program (Signed)
  Dept: [phone]   Congregational Nurse Program Note  Date of Encounter: 05/12/2022  Clinic visit for blood pressure check and for respiratory recheck from emergency visit on 05/06/22.  Reviewed ER discharge summary with resident and symptoms that would require follow-up visit.  Voiced understanding of symptoms that would require MD visit.  BP 111/82, pulse 96 and regular, respirations 20, lungs clear.  Has non-productive cough with nasal congestion.   Reminded to continue to drink liquids as much as possible but limit any liquids with added sugar. Past Medical History: Past Medical History:  Diagnosis Date   Diabetes mellitus without complication ([place])    Hypertension    Stroke [facility])     Encounter Details:  CNP Questionnaire - 05/12/22 1700       Questionnaire   Ask client: Do you give verbal consent for me to treat you today? Yes    Student Assistance N/A    Location Patient [place]    Visit Setting with Client Organization    Patient Status Unknown   Has own apartment at [facility];Medicare    Insurance/Financial Assistance Referral N/A    Medication N/A    Medical Provider Yes    Screening Referrals Made N/A    Medical Referrals Made N/A    Medical Appointment Made N/A    Recently w/o PCP, now 1st time PCP visit completed due to CNs referral or appointment made N/A    Food N/A    Transportation Need transportation assistance    Housing/Utilities N/A    Interpersonal Safety N/A    Interventions Educate;Counsel;Advocate/Support;Reviewed D/C Planning    Abnormal to Normal Screening Since Last CN Visit N/A    Screenings CN Performed Blood Pressure    Sent Client to Lab for: N/A    Did client attend any of the following based off CNs referral or appointments made? N/A    ED Visit Averted N/A    Life-Saving Intervention Made N/A      Questionnaire   Do you give verbal consent to treat you today? Yes    Location  Patient Served  Not Applicable    Visit Setting Church or Organization    Patient Status Unknown    Insurance Medicaid;Medicare    Insurance Referral N/A    Medication N/A    Screening Referrals N/A    Intervention Blood pressure;Support;Counsel;Educate

## 2022-05-12 NOTE — Progress Notes (Signed)
Eden Lathe, RN  Elsie Stain, MD Caller: Unspecified (4 days ago,  1:02 PM) I spoke to  [facility] [phone] and she explained that the patient needs to have a PT seating eval for a power chair. That is all that is needed from PCP.   I called the patient and explained the need for the PT eval, Dr Joya Gaskins had ordered one in Aug 2023 and it was never completed. The patient said no one ever contacted him about that.  He then said he just spoke to someone at [facility] for 45 minutes and they told him that his insurance was denied. He was hesitant about scheduling the PT eval   I spoke to Nicole/ [employer] and she explained that his insurance denied the telehealth PT seating eval that was to be done by [employer].  His insurance is in network with [employer], they just denied the telehealth assessment  They are requesting the provider order an in person  PT seating eval.    I called the patient and clarified what [facility] told me.  He said he understood and if possible would like the PT eval to be done at outpatient therapy on [place]- can you assist with this order?  Thanks

## 2022-05-13 NOTE — Telephone Encounter (Signed)
Noted.  Referral coordinator needs to process referral.

## 2022-05-19 NOTE — Congregational Nurse Program (Signed)
  Dept: [phone]   Congregational Nurse Program Note  Date of Encounter: 05/19/2022  Clinic visit for blood pressure check and for concern over cough and congestion.  Asked if Mucinex over the counter cold medicine could be used with his prescription medication. BP 114/78, pulse 92 and regular, O2 Sat 95%.  Lungs were clear, has non productive cough and states has had congestion this week.  Recommended not taking Tylenol if taking the Mucinex as it contain acetaminophen.  To contact PCP office symptoms persist, develops fever or any new symptoms.  States that he is to have appointment for evaluation for an electronic wheel chair. Will let nurse know if needs any assistance with appointment. Past Medical History: Past Medical History:  Diagnosis Date   Diabetes mellitus without complication ([place])    Hypertension    Stroke [facility])     Encounter Details:  CNP Questionnaire - 05/19/22 1330       Questionnaire   Ask client: Do you give verbal consent for me to treat you today? Yes    Student Assistance N/A    Location Patient [place]    Visit Setting with Client Organization    Patient Status Unknown    Insurance Medicaid;Medicare    Insurance/Financial Assistance Referral N/A    Medication N/A    Medical Provider Yes    Screening Referrals Made N/A    Medical Referrals Made N/A    Medical Appointment Made N/A    Recently w/o PCP, now 1st time PCP visit completed due to CNs referral or appointment made N/A    Food N/A    Transportation Need transportation assistance    Housing/Utilities N/A    Interpersonal Safety N/A    Interventions [place]    Abnormal to Normal Screening Since Last CN Visit N/A    Screenings CN Performed Blood Pressure;Pulse Ox    Sent Client to Lab for: N/A    Did client attend any of the following based off CNs referral or appointments made? N/A    ED Visit Averted N/A    Life-Saving Intervention Made N/A       Questionnaire   Do you give verbal consent to treat you today? Yes    Location Patient Served  Not Applicable    Visit Setting Church or Organization    Patient Status Unknown    Insurance Medicaid;Medicare    Insurance Referral N/A    Medication N/A    Screening Referrals N/A    Intervention Blood pressure;Navigate Healthcare System;Support;Counsel

## 2022-05-23 ENCOUNTER — Emergency Department (HOSPITAL_COMMUNITY): Payer: Medicare HMO

## 2022-05-23 ENCOUNTER — Emergency Department (HOSPITAL_COMMUNITY)
Admission: EM | Admit: 2022-05-23 | Discharge: 2022-05-23 | Disposition: A | Discharge status: Home / Self Care | Payer: Medicare HMO | Attending: Emergency Medicine | Admitting: Emergency Medicine

## 2022-05-23 ENCOUNTER — Encounter (HOSPITAL_COMMUNITY): Payer: Self-pay | Admitting: Emergency Medicine

## 2022-05-23 ENCOUNTER — Other Ambulatory Visit: Payer: Self-pay

## 2022-05-23 DIAGNOSIS — I10 Essential (primary) hypertension: Secondary | ICD-10-CM | POA: Insufficient documentation

## 2022-05-23 DIAGNOSIS — J9811 Atelectasis: Secondary | ICD-10-CM | POA: Diagnosis not present

## 2022-05-23 DIAGNOSIS — R0789 Other chest pain: Secondary | ICD-10-CM | POA: Diagnosis not present

## 2022-05-23 DIAGNOSIS — F17200 Nicotine dependence, unspecified, uncomplicated: Secondary | ICD-10-CM | POA: Diagnosis not present

## 2022-05-23 DIAGNOSIS — Z7982 Long term (current) use of aspirin: Secondary | ICD-10-CM | POA: Diagnosis not present

## 2022-05-23 DIAGNOSIS — E119 Type 2 diabetes mellitus without complications: Secondary | ICD-10-CM | POA: Insufficient documentation

## 2022-05-23 DIAGNOSIS — Z79899 Other long term (current) drug therapy: Secondary | ICD-10-CM | POA: Diagnosis not present

## 2022-05-23 DIAGNOSIS — R079 Chest pain, unspecified: Secondary | ICD-10-CM

## 2022-05-23 DIAGNOSIS — Z794 Long term (current) use of insulin: Secondary | ICD-10-CM | POA: Insufficient documentation

## 2022-05-23 DIAGNOSIS — R059 Cough, unspecified: Secondary | ICD-10-CM | POA: Diagnosis not present

## 2022-05-23 LAB — BASIC METABOLIC PANEL
Anion gap: 11 (ref 5–15)
BUN: 23 mg/dL (ref 8–23)
CO2: 28 mmol/L (ref 22–32)
Calcium: 9.8 mg/dL (ref 8.9–10.3)
Chloride: 102 mmol/L (ref 98–111)
Creatinine, Ser: 1.66 mg/dL — ABNORMAL HIGH (ref 0.61–1.24)
GFR, Estimated: 44 mL/min — ABNORMAL LOW (ref >60)
Glucose, Bld: 205 mg/dL — ABNORMAL HIGH (ref 70–99)
Potassium: 3.5 mmol/L (ref 3.5–5.1)
Sodium: 141 mmol/L (ref 135–145)

## 2022-05-23 LAB — CBC WITH DIFFERENTIAL/PLATELET
Abs Immature Granulocytes: 0.03 10*3/uL (ref 0.00–0.07)
Basophils Absolute: 0.1 10*3/uL (ref 0.0–0.1)
Basophils Relative: 1 %
Eosinophils Absolute: 0.4 10*3/uL (ref 0.0–0.5)
Eosinophils Relative: 5 %
HCT: 44.6 % (ref 39.0–52.0)
Hemoglobin: 14.5 g/dL (ref 13.0–17.0)
Immature Granulocytes: 0 %
Lymphocytes Relative: 36 %
Lymphs Abs: 2.8 10*3/uL (ref 0.7–4.0)
MCH: 30 pg (ref 26.0–34.0)
MCHC: 32.5 g/dL (ref 30.0–36.0)
MCV: 92.1 fL (ref 80.0–100.0)
Monocytes Absolute: 1 10*3/uL (ref 0.1–1.0)
Monocytes Relative: 13 %
Neutro Abs: 3.4 10*3/uL (ref 1.7–7.7)
Neutrophils Relative %: 45 %
Platelets: 255 10*3/uL (ref 150–400)
RBC: 4.84 MIL/uL (ref 4.22–5.81)
RDW: 15.2 % (ref 11.5–15.5)
WBC: 7.7 10*3/uL (ref 4.0–10.5)
nRBC: 0 % (ref 0.0–0.2)

## 2022-05-23 LAB — TROPONIN I (HIGH SENSITIVITY): Troponin I (High Sensitivity): 16 ng/L (ref <18)

## 2022-05-23 MED ORDER — ASPIRIN 81 MG PO CHEW: 324.0000 mg | CHEWABLE_TABLET | Freq: Once | ORAL | Status: AC

## 2022-05-23 MED ORDER — DICLOFENAC SODIUM 1 % EX GEL: 4.0000 g | Freq: Four times a day (QID) | CUTANEOUS | 0 refills | Status: DC

## 2022-05-23 NOTE — ED Provider Notes (Signed)
[facility] EMERGENCY DEPARTMENT Provider Note   CSN: [id number] Arrival date & time: 05/23/22  0350     History  Chief Complaint  Patient presents with   Chest Pain    Reginald Rodriguez is a 71 y.o. male.  71 yo M with a chief complaint of chest pain.  This is been going on for about 3 weeks.  Has been coinciding with an upper respiratory illness.  He has had very severe coughing with this illness.  That part of his symptoms has gotten better, still is coughing quite a bit.  Has pain in the chest mostly with coughing.  Pain to the left upper chest.  Denies fevers denies difficulty breathing.  Patient denies history of MI, he has a history of hypertension hyperlipidemia diabetes and is an active every day smoker.  His mother had an MI in her 30s. Patient denies history of PE or DVT denies hemoptysis denies unilateral lower extremity edema denies recent surgery immobilization hospitalization estrogen use or history of cancer.     Chest Pain      Home Medications Prior to Admission medications   Medication Sig Start Date End Date Taking? Authorizing Provider  diclofenac Sodium (VOLTAREN) 1 % GEL Apply 4 g topically 4 (four) times daily. 05/23/22  Yes Deno Etienne, DO  Accu-Chek Softclix Lancets lancets Use to check blood sugar 3 times daily. 04/15/22   Elsie Stain, MD  amLODipine (NORVASC) 5 MG tablet Take 1 tablet (5 mg total) by mouth daily. 12/31/21   Elsie Stain, MD  aspirin 81 MG EC tablet Take 1 tablet (81 mg total) by mouth daily. 08/27/20   Elsie Stain, MD  atorvastatin (LIPITOR) 10 MG tablet Take 1 tablet (10 mg total) by mouth daily. 04/02/22   Elsie Stain, MD  Blood Glucose Monitoring Suppl (ACCU-CHEK GUIDE) w/Device KIT Use to check blood sugar 3 times daily. 04/15/22   Elsie Stain, MD  chlorthalidone (HYGROTON) 25 MG tablet Take 1 tablet (25 mg total) by mouth daily. 12/31/21   Elsie Stain, MD  dapagliflozin propanediol  (FARXIGA) 10 MG TABS tablet Take 1 tablet (10 mg total) by mouth daily before breakfast. 04/02/22   Elsie Stain, MD  fenofibrate micronized (LOFIBRA) 134 MG capsule Take 1 capsule (134 mg total) by mouth daily before breakfast. 12/31/21 12/26/22  Elsie Stain, MD  gabapentin (NEURONTIN) 100 MG capsule Take 1 capsule (100 mg total) by mouth 2 (two) times daily. 04/02/22 04/02/23  Elsie Stain, MD  glucose blood (ACCU-CHEK GUIDE) test strip 1 each by Other route 3 (three) times daily. Use to check blood sugar 3 times daily. 04/17/22   Shamleffer, Melanie Crazier, MD  insulin degludec (TRESIBA FLEXTOUCH) 200 UNIT/ML FlexTouch Pen Inject 70 Units into the skin daily in the afternoon. 04/17/22   Shamleffer, Melanie Crazier, MD  Insulin Pen Needle 31G X 5 MM MISC Use in the morning, at noon, in the evening, and at bedtime. 04/17/22   Shamleffer, Melanie Crazier, MD  lactulose, encephalopathy, (CHRONULAC) 10 GM/15ML SOLN Take 15 mLs (10 g total) by mouth daily as needed (constipation). 06/16/21   Elsie Stain, MD  lisinopril (ZESTRIL) 2.5 MG tablet Take 1 tablet (2.5 mg total) by mouth daily. 04/02/22   Elsie Stain, MD  NOVOLOG FLEXPEN 100 UNIT/ML FlexPen Max daily 90 units 04/17/22   Shamleffer, Melanie Crazier, MD  pioglitazone (ACTOS) 15 MG tablet Take 1 tablet (15 mg total) by mouth daily.  04/17/22   Shamleffer, Melanie Crazier, MD      Allergies    Bee venom and Penicillins    Review of Systems   Review of Systems  Cardiovascular:  Positive for chest pain.    Physical Exam Updated Vital Signs BP (!) 135/102   Pulse 73   Temp 98.1 F (36.7 C) (Oral)   Resp 15   SpO2 93%  Physical Exam Vitals and nursing note reviewed.  Constitutional:      Appearance: He is well-developed.  HENT:     Head: Normocephalic and atraumatic.  Eyes:     Pupils: Pupils are equal, round, and reactive to light.  Neck:     Vascular: No JVD.  Cardiovascular:     Rate and Rhythm: Normal rate and  regular rhythm.     Heart sounds: No murmur heard.    No friction rub. No gallop.  Pulmonary:     Effort: No respiratory distress.     Breath sounds: No wheezing.  Chest:     Chest wall: Tenderness present.     Comments: Pain with palpation of the left chest wall about the medial clavicular line about ribs 2 through 4 reproduce his discomfort. Abdominal:     General: There is no distension.     Tenderness: There is no abdominal tenderness. There is no guarding or rebound.  Musculoskeletal:        General: Normal range of motion.     Cervical back: Normal range of motion and neck supple.  Skin:    Coloration: Skin is not pale.     Findings: No rash.  Neurological:     Mental Status: He is alert and oriented to person, place, and time.  Psychiatric:        Behavior: Behavior normal.     ED Results / Procedures / Treatments   Labs (all labs ordered are listed, but only abnormal results are displayed) Labs Reviewed  BASIC METABOLIC PANEL - Abnormal; Notable for the following components:      Result Value   Glucose, Bld 205 (*)    Creatinine, Ser 1.66 (*)    GFR, Estimated 44 (*)    All other components within normal limits  CBC WITH DIFFERENTIAL/PLATELET  TROPONIN I (HIGH SENSITIVITY)    EKG EKG Interpretation  Date/Time:  Saturday May 23 2022 04:09:13 EDT Ventricular Rate:  81 PR Interval:  180 QRS Duration: 79 QT Interval:  373 QTC Calculation: 433 R Axis:   26 Text Interpretation: Sinus rhythm Abnormal R-wave progression, early transition No significant change since last tracing Confirmed by Deno Etienne [phone]) on 05/23/2022 4:39:23 AM  Radiology DG Chest Port 1 View  Result Date: 05/23/2022 CLINICAL DATA:  Chest pain. EXAM: PORTABLE CHEST 1 VIEW COMPARISON:  May 06, 2022 FINDINGS: The heart size and mediastinal contours are within normal limits. Low lung volumes are noted. Mild atelectasis is seen within the bilateral lung bases. There is no evidence of a  pleural effusion or pneumothorax. The visualized skeletal structures are unremarkable. IMPRESSION: Low lung volumes with mild bibasilar atelectasis. Electronically Signed   By: Virgina Norfolk M.D.   On: 05/23/2022 04:36    Procedures Procedures    Medications Ordered in ED Medications  aspirin chewable tablet 324 mg (324 mg Oral Given by EMS 05/23/22 0417)    ED Course/ Medical Decision Making/ A&P  Medical Decision Making Amount and/or Complexity of Data Reviewed Labs: ordered. Radiology: ordered. ECG/medicine tests: ordered.  Risk OTC drugs.   71 yo M with a chief complaint of chest pain.  This been coinciding with an upper respiratory illness.  Seems musculoskeletal by history and physical.  Going on for the better part of 3 weeks.  No significant change in 6 hours.  We will obtain a single troponin.  EKG chest x-ray blood work.   Troponin negative, no anemia, renal function at baseline.  No significant electrolyte abnormality.  Chest x-ray independently interpreted by me without focal infiltrate or pneumothorax.  I feel his symptoms are completely atypical of pulmonary embolism and no further work-up is warranted.  Most likely musculoskeletal by history and physical.  Will discharge home.  PCP follow-up.  5:46 AM:  I have discussed the diagnosis/risks/treatment options with the patient.  Evaluation and diagnostic testing in the emergency department does not suggest an emergent condition requiring admission or immediate intervention beyond what has been performed at this time.  They will follow up with PCP. We also discussed returning to the ED immediately if new or worsening sx occur. We discussed the sx which are most concerning (e.g., sudden worsening pain, fever, inability to tolerate by mouth) that necessitate immediate return. Medications administered to the patient during their visit and any new prescriptions provided to the patient are listed  below.  Medications given during this visit Medications  aspirin chewable tablet 324 mg (324 mg Oral Given by EMS 05/23/22 0417)     The patient appears reasonably screen and/or stabilized for discharge and I doubt any other medical condition or other [facility] requiring further screening, evaluation, or treatment in the ED at this time prior to discharge.         Final Clinical Impression(s) / ED Diagnoses Final diagnoses:  Nonspecific chest pain    Rx / DC Orders ED Discharge Orders          Ordered    diclofenac Sodium (VOLTAREN) 1 % GEL  4 times daily        05/23/22 [place], [place], DO 05/23/22 [phone]

## 2022-05-23 NOTE — ED Triage Notes (Signed)
Patient here from home with left sided chest wall pain.  Had RSV a few weeks ago, still have some coughing and having a slight productive cough.  He is hypertensive, but takes HTN meds in am.  He was given '324mg'$  ASA en route to ED.  Patient denies any shortness of breath, nausea or vomiting.  Patient uses a walker to get around, patient had a stroke on the right side and is a little weaker on the right.  Usually sleeps on right side, but slept on left side today.

## 2022-05-23 NOTE — Discharge Instructions (Signed)
Please follow-up with your family doctor in the office.  Return for worsening symptoms if you develop a fever or if you develop difficulty breathing. Use the gel as prescribed. Also take tylenol '1000mg'$ (2 extra strength) four times a day.

## 2022-05-23 NOTE — ED Notes (Signed)
Pt called out for nurse & this RN went in to pt being very angry. He was demanding all monitoring devices removed & that he will find his own way out of here. This RN told him to calm down & assisted him with EKG wire removal. He continues to fuss & this RN exited the room d/t his attitude excalating. He left without d/c paperwork, walking without a walker & fussing at staff towards the exit.

## 2022-05-26 NOTE — Congregational Nurse Program (Signed)
  Dept: [phone]   Congregational Nurse Program Note  Date of Encounter: 05/26/2022  Clinic visit to confirm transportation to [place] on Friday, November 10th.  Given ride confirmation number and pick up time of 9:06 AM.  Reviewed discharge instructions from ER visit on Saturday for chest pain, diagnosis was chest pain from the continued cough.  Cough at this time is non-productive and lungs clear.    Blood sugar per continuous glucose monitor 128 prior to lunch, reviewed meal plans and discussed increased blood glucose levels when ill. Past Medical History: Past Medical History:  Diagnosis Date   Diabetes mellitus without complication ([place])    Hypertension    Stroke [facility])     Encounter Details:  CNP Questionnaire - 05/26/22 1341       Questionnaire   Ask client: Do you give verbal consent for me to treat you today? Yes    Student Assistance N/A    Location Patient [place]    Visit Setting with Client Organization    Patient Status Unknown    Insurance Medicaid;Medicare    Insurance/Financial Assistance Referral N/A    Medication N/A    Medical Provider Yes    Screening Referrals Made N/A    Medical Referrals Made N/A    Medical Appointment Made N/A    Recently w/o PCP, now 1st time PCP visit completed due to CNs referral or appointment made N/A    Food N/A    Transportation Need transportation assistance    Housing/Utilities N/A    Interpersonal Safety N/A    Interventions Reviewed D/C Planning;Advocate/Support;Counsel    Abnormal to Normal Screening Since Last CN Visit N/A    Screenings CN Performed N/A    Sent Client to Lab for: N/A    Did client attend any of the following based off CNs referral or appointments made? N/A    ED Visit Averted N/A    Life-Saving Intervention Made N/A      Questionnaire   Do you give verbal consent to treat you today? Yes    Location Patient Served  Not Applicable    Visit Setting Church or  Organization    Patient Status Unknown    Insurance Medicaid;Medicare    Insurance Referral N/A    Medication N/A    Screening Referrals N/A    Intervention Counsel;Support

## 2022-05-28 NOTE — Telephone Encounter (Signed)
I spoke to patient and he confirmed that he has his PT eval for the power wheelchair scheduled for 07/01/2022

## 2022-05-29 ENCOUNTER — Telehealth: Payer: Self-pay | Admitting: Licensed Clinical Social Worker

## 2022-05-29 ENCOUNTER — Ambulatory Visit
Admission: RE | Admit: 2022-05-29 | Discharge: 2022-05-29 | Disposition: A | Discharge status: Home / Self Care | Payer: Medicare HMO | Source: Ambulatory Visit | Attending: Critical Care Medicine | Admitting: Critical Care Medicine

## 2022-05-29 DIAGNOSIS — F17200 Nicotine dependence, unspecified, uncomplicated: Secondary | ICD-10-CM

## 2022-05-29 DIAGNOSIS — F17210 Nicotine dependence, cigarettes, uncomplicated: Secondary | ICD-10-CM | POA: Diagnosis not present

## 2022-05-29 DIAGNOSIS — Z87891 Personal history of nicotine dependence: Secondary | ICD-10-CM

## 2022-05-29 NOTE — Patient Instructions (Signed)
Visit Information  Thank you for taking time to visit with me today. Please don't hesitate to contact me if I can be of assistance to you.   Following are the goals we discussed today:   Goals Addressed             This Visit's Progress    COMPLETED: Care Coordination Activity-No F/up       Care Coordination Interventions: Active listening / Reflection utilized  LCSW inquired about how pt is feeling after recent ED visit on 11/04. Pt share he is feeling better and completed appt with [facility] LCSW reminded pt of care coordination services, to support with managing health conditions. Pt shared he is not in need, at this time LCSW reviewed upcoming appts with pt         If you are experiencing a Mental Health or [place] or need someone to talk to, please call the Suicide and Crisis Lifeline: 988 call 911   The patient verbalized understanding of instructions, educational materials, and care plan provided today and DECLINED offer to receive copy of patient instructions, educational materials, and care plan.   No further follow up required:    Christa See, MSW, [email or web address] Phone [phone] 2:38 PM

## 2022-05-29 NOTE — Patient Outreach (Signed)
  Care Coordination   Follow Up Visit Note   05/29/2022 Name: Reginald Rodriguez MRN: [id number] DOB: January 06, 1951  Reginald Rodriguez is a 71 y.o. year old male who sees Elsie Stain, MD for primary care. I spoke with  [name] by phone today.  What matters to the patients health and wellness today?  ED Follow up call    Goals Addressed             This Visit's Progress    COMPLETED: Care Coordination Activity-No F/up       Care Coordination Interventions: Active listening / Reflection utilized  LCSW inquired about how pt is feeling after recent ED visit on 11/04. Pt share he is feeling better and completed appt with [facility] LCSW reminded pt of care coordination services, to support with managing health conditions. Pt shared he is not in need, at this time LCSW reviewed upcoming appts with pt         SDOH assessments and interventions completed:  No     Care Coordination Interventions Activated:  Yes  Care Coordination Interventions:  Yes, provided   Follow up plan: No further intervention required.   Encounter Outcome:  Pt. Visit Completed   Christa See, MSW, [email or web address] Phone [phone] 2:37 PM

## 2022-06-02 ENCOUNTER — Other Ambulatory Visit: Payer: Self-pay

## 2022-06-02 ENCOUNTER — Telehealth: Payer: Self-pay | Admitting: Critical Care Medicine

## 2022-06-02 DIAGNOSIS — Z122 Encounter for screening for malignant neoplasm of respiratory organs: Secondary | ICD-10-CM

## 2022-06-02 DIAGNOSIS — Z87891 Personal history of nicotine dependence: Secondary | ICD-10-CM

## 2022-06-02 DIAGNOSIS — F17210 Nicotine dependence, cigarettes, uncomplicated: Secondary | ICD-10-CM

## 2022-06-02 NOTE — Telephone Encounter (Signed)
Let pt know CT Chest shows no lung cancer on screening study

## 2022-06-02 NOTE — Congregational Nurse Program (Signed)
  Dept: [phone]   Congregational Nurse Program Note  Date of Encounter: 06/02/2022  Clinic visit for blood pressure and blood glucose checks.  Continuous glucose monitor not working this week, awaiting delivery of new supplies. BP 107/79, weight 257 Lbs, blood glucose 178 about 2 hours after lunch. Went to appointment for imaging of lung as arranged.  Educated regarding types of carbohydrates and on preventing weight gain.    Past Medical History: Past Medical History:  Diagnosis Date   Diabetes mellitus without complication ([place])    Hypertension    Stroke [facility])     Encounter Details:  CNP Questionnaire - 06/02/22 1430       Questionnaire   Ask client: Do you give verbal consent for me to treat you today? Yes    Student Assistance N/A    Location Patient [place]    Visit Setting with Client Organization    Patient Status Unknown    Insurance Medicare;Medicaid    Insurance/Financial Assistance Referral N/A    Medication N/A    Medical Provider Yes    Screening Referrals Made N/A    Medical Referrals Made N/A    Medical Appointment Made N/A    Recently w/o PCP, now 1st time PCP visit completed due to CNs referral or appointment made N/A    Food N/A    Transportation Need transportation assistance    Housing/Utilities N/A    Interpersonal Safety N/A    Interventions Reviewed Medications;Educate;Counsel;Advocate/Support    Abnormal to Normal Screening Since Last CN Visit N/A    Screenings CN Performed Blood Pressure;Blood Glucose;Weight    Sent Client to Lab for: N/A    Did client attend any of the following based off CNs referral or appointments made? Transportation    ED Visit Averted N/A    Life-Saving Intervention Made N/A      Questionnaire   Do you give verbal consent to treat you today? Yes    Location Patient Served  Not Applicable    Visit Setting Church or Organization    Patient Status Unknown    Insurance Medicare    Insurance  Referral N/A    Medication N/A    Screening Referrals N/A    Intervention Blood pressure;Blood glucose;Counsel;Educate

## 2022-06-05 NOTE — Telephone Encounter (Signed)
Called patient and he stated that he was already given results. No question or concerns at this time

## 2022-06-12 ENCOUNTER — Other Ambulatory Visit: Payer: Self-pay | Admitting: Internal Medicine

## 2022-06-16 DIAGNOSIS — E1165 Type 2 diabetes mellitus with hyperglycemia: Secondary | ICD-10-CM

## 2022-06-16 LAB — GLUCOSE, POCT (MANUAL RESULT ENTRY): POC Glucose: 271 mg/dl — AB (ref 70–99)

## 2022-06-16 NOTE — Congregational Nurse Program (Signed)
  Dept: [phone]   Congregational Nurse Program Note  Date of Encounter: 06/16/2022  Clinic visit to check blood pressure and blood glucose.  BP 116/87, pulse 96 and regular, O2 Sat 98%.  Blood glucose 271 approximately 1.5 hours after lunch.  States he missed taking insulin prior to meal.  Educated regarding carbohydrate intake and need to take insulin as prescribed prior to each meal.  Counseled regarding a weight loss plan and ways to increase physical activity within limitations from Rt hemiparesis from the CVA. Requested transportation for 06/23/2022 appointment. Past Medical History: Past Medical History:  Diagnosis Date   Diabetes mellitus without complication ([place])    Hypertension    Stroke [facility])     Encounter Details:  CNP Questionnaire - 06/16/22 1400       Questionnaire   Ask client: Do you give verbal consent for me to treat you today? Yes    Student Assistance N/A    Location Patient [place]    Visit Setting with Client Organization    Patient Status Unknown   Has apartment at [facility] Medicare;Medicaid    Insurance/Financial Assistance Referral N/A    Medication N/A    Medical Provider Yes    Screening Referrals Made N/A    Medical Referrals Made N/A    Medical Appointment Made N/A    Recently w/o PCP, now 1st time PCP visit completed due to CNs referral or appointment made N/A    Food N/A    Transportation Need transportation assistance;Provided transportation assistance    Housing/Utilities N/A    Interpersonal Safety N/A    Interventions Reviewed Medications;Educate;Counsel;Advocate/Support;Spiritual Care    Abnormal to Normal Screening Since Last CN Visit N/A    Screenings CN Performed Blood Pressure;Blood Glucose;Pulse Ox    Sent Client to Lab for: N/A    Did client attend any of the following based off CNs referral or appointments made? N/A    ED Visit Averted N/A    Life-Saving Intervention Made N/A       Questionnaire   Do you give verbal consent to treat you today? Yes    Location Patient Served  Not Applicable    Visit Setting Church or Organization    Patient Status Unknown    Insurance Medicare    Insurance Referral N/A    Medication N/A    Screening Referrals N/A    Intervention Blood pressure;Blood glucose;Counsel;Educate

## 2022-06-18 ENCOUNTER — Ambulatory Visit: Payer: Medicare HMO | Admitting: Podiatry

## 2022-06-22 NOTE — Progress Notes (Unsigned)
Annual Wellness Visit     Patient: Reginald Rodriguez, Male    DOB: 16-Feb-1951, 71 y.o.   MRN: [id number]  Subjective  No chief complaint on file.   Gustav Knueppel is a 71 y.o. male who presents today for his Annual Wellness Visit. He reports consuming a {diet types:17450} diet. {Exercise:19826} He generally feels {well/fairly well/poorly:18703}. He reports sleeping {well/fairly well/poorly:18703}. He {does/does not:200015} have additional problems to discuss today.   Cardiovascular and Mediastinum Essential hypertension  Hypertension well controlled no changes made   Relevant Medications atorvastatin (LIPITOR) 10 MG tablet lisinopril (ZESTRIL) 2.5 MG tablet Respiratory COPD with emphysema (HCC)  Stable on inhaled medicines   Endocrine Uncontrolled type 2 diabetes mellitus with hyperglycemia (HCC) - Primary  Improved glycemic control no changes   Relevant Medications atorvastatin (LIPITOR) 10 MG tablet dapagliflozin propanediol (FARXIGA) 10 MG TABS tablet insulin degludec (TRESIBA FLEXTOUCH) 200 UNIT/ML FlexTouch Pen lisinopril (ZESTRIL) 2.5 MG tablet pioglitazone (ACTOS) 15 MG tablet Other Relevant Orders POCT glycosylated hemoglobin (Hb A1C) (Completed) Genitourinary CKD (chronic kidney disease) stage 3, GFR 30-59 ml/min (HCC)  Stable renal function       {VISON DENTAL STD PSA (Optional):27386}   {History (Optional):23778}  Medications: Outpatient Medications Prior to Visit  Medication Sig   Accu-Chek Softclix Lancets lancets Use to check blood sugar 3 times daily.   amLODipine (NORVASC) 5 MG tablet Take 1 tablet (5 mg total) by mouth daily.   aspirin 81 MG EC tablet Take 1 tablet (81 mg total) by mouth daily.   atorvastatin (LIPITOR) 10 MG tablet Take 1 tablet (10 mg total) by mouth daily.   Blood Glucose Monitoring Suppl (ACCU-CHEK GUIDE) w/Device KIT Use to check blood sugar 3 times daily.   chlorthalidone (HYGROTON) 25 MG tablet Take 1 tablet (25 mg  total) by mouth daily.   dapagliflozin propanediol (FARXIGA) 10 MG TABS tablet Take 1 tablet (10 mg total) by mouth daily before breakfast.   diclofenac Sodium (VOLTAREN) 1 % GEL Apply 4 g topically 4 (four) times daily.   fenofibrate micronized (LOFIBRA) 134 MG capsule Take 1 capsule (134 mg total) by mouth daily before breakfast.   gabapentin (NEURONTIN) 100 MG capsule Take 1 capsule (100 mg total) by mouth 2 (two) times daily.   glucose blood (ACCU-CHEK GUIDE) test strip 1 each by Other route 3 (three) times daily. Use to check blood sugar 3 times daily.   insulin degludec (TRESIBA FLEXTOUCH) 200 UNIT/ML FlexTouch Pen Inject 70 Units into the skin daily in the afternoon.   Insulin Pen Needle 31G X 5 MM MISC Use in the morning, at noon, in the evening, and at bedtime.   lactulose, encephalopathy, (CHRONULAC) 10 GM/15ML SOLN Take 15 mLs (10 g total) by mouth daily as needed (constipation).   lisinopril (ZESTRIL) 2.5 MG tablet Take 1 tablet (2.5 mg total) by mouth daily.   NOVOLOG FLEXPEN 100 UNIT/ML FlexPen Max daily 90 units   pioglitazone (ACTOS) 15 MG tablet TAKE 1 TABLET (15 MG TOTAL) BY MOUTH DAILY.   Facility-Administered Medications Prior to Visit  Medication Dose Route Frequency Provider   glucose chewable tablet 4 g  1 tablet Oral Once Elsie Stain, MD    Allergies  Allergen Reactions   Bee Venom Swelling   Penicillins Rash    Patient Care Team: Elsie Stain, MD as PCP - General (Pulmonary Disease)  ROS      Objective  There were no vitals taken for this visit. {Vitals History (Optional):23777}  Physical Exam  Most recent functional status assessment:     No data to display         Most recent fall risk assessment:    04/02/2022    8:48 AM  [place] in the past year? 0  Number falls in past yr: 0  Injury with Fall? 0    Most recent depression screenings:    04/02/2022    8:48 AM 09/30/2021   10:13 AM  PHQ 2/9 Scores  PHQ - 2  Score 0 0  PHQ- 9 Score 0 0   Most recent cognitive screening:    03/22/2021   11:49 AM  6CIT Screen  What Year? 0 points  What month? 0 points  What time? 0 points  Count back from 20 0 points  Months in reverse 0 points   Most recent Audit-C alcohol use screening     No data to display         A score of 3 or more in women, and 4 or more in men indicates increased risk for alcohol abuse, EXCEPT if all of the points are from question 1   Vision/Hearing Screen: No results found.  {Labs (Optional):23779}  No results found for any visits on 06/23/22.    Assessment & Plan   Annual wellness visit done today including the all of the following: Reviewed patient's Family Medical History Reviewed and updated list of patient's medical providers Assessment of cognitive impairment was done Assessed patient's functional ability Established a written schedule for health screening services Health Risk Assessent Completed and Reviewed  Exercise Activities and Dietary recommendations  Goals   None     Immunization History  Administered Date(s) Administered   Fluad Quad(high Dose 65+) 04/02/2022   Influenza,inj,Quad PF,6+ Mos 04/15/2020, 03/19/2021   PFIZER Comirnaty(Gray Top)Covid-19 Tri-Sucrose Vaccine 09/11/2020, 01/22/2021   PFIZER(Purple Top)SARS-COV-2 Vaccination 11/09/2019, 12/07/2019   Pneumococcal Conjugate-13 04/15/2020   Pneumococcal Polysaccharide-23 06/16/2021   Respiratory Syncytial Virus Vaccine,Recomb Aduvanted(Arexvy) 04/02/2022   Tdap 04/15/2017   Zoster Recombinat (Shingrix) 04/15/2022    Health Maintenance  Topic Date Due   COVID-19 Vaccine (5 - 2023-24 season) 03/20/2022   Medicare Annual Wellness (AWV)  03/22/2022   FOOT EXAM  04/01/2022   Zoster Vaccines- Shingrix (2 of 2) 06/10/2022   HEMOGLOBIN A1C  10/01/2022   OPHTHALMOLOGY EXAM  10/15/2022   Diabetic kidney evaluation - Urine ACR  01/01/2023   Diabetic kidney evaluation - GFR measurement   05/24/2023   Lung Cancer Screening  05/30/2023   DTaP/Tdap/Td (2 - Td or Tdap) 04/16/2027   COLONOSCOPY (Pts 45-70yr Insurance coverage will need to be confirmed)  07/24/2029   Pneumonia Vaccine 71 Years old  Completed   INFLUENZA VACCINE  Completed   Hepatitis C Screening  Completed   HPV VACCINES  Aged Out     Discussed health benefits of physical activity, and encouraged him to engage in regular exercise appropriate for his age and condition.    Problem List Items Addressed This Visit   None   No follow-ups on file.     PAsencion Noble MD

## 2022-06-23 ENCOUNTER — Ambulatory Visit: Payer: Medicare HMO | Attending: Critical Care Medicine | Admitting: Critical Care Medicine

## 2022-06-23 ENCOUNTER — Ambulatory Visit: Payer: Medicare HMO | Admitting: Critical Care Medicine

## 2022-06-23 ENCOUNTER — Telehealth: Payer: Self-pay

## 2022-06-23 ENCOUNTER — Encounter: Payer: Self-pay | Admitting: Critical Care Medicine

## 2022-06-23 VITALS — BP 117/82 | HR 89 | Wt 263.8 lb

## 2022-06-23 DIAGNOSIS — I2510 Atherosclerotic heart disease of native coronary artery without angina pectoris: Secondary | ICD-10-CM | POA: Diagnosis not present

## 2022-06-23 DIAGNOSIS — I6930 Unspecified sequelae of cerebral infarction: Secondary | ICD-10-CM

## 2022-06-23 DIAGNOSIS — M7989 Other specified soft tissue disorders: Secondary | ICD-10-CM | POA: Diagnosis not present

## 2022-06-23 DIAGNOSIS — I129 Hypertensive chronic kidney disease with stage 1 through stage 4 chronic kidney disease, or unspecified chronic kidney disease: Secondary | ICD-10-CM | POA: Diagnosis not present

## 2022-06-23 DIAGNOSIS — Z23 Encounter for immunization: Secondary | ICD-10-CM | POA: Insufficient documentation

## 2022-06-23 DIAGNOSIS — J439 Emphysema, unspecified: Secondary | ICD-10-CM | POA: Diagnosis not present

## 2022-06-23 DIAGNOSIS — I69351 Hemiplegia and hemiparesis following cerebral infarction affecting right dominant side: Secondary | ICD-10-CM | POA: Insufficient documentation

## 2022-06-23 DIAGNOSIS — I714 Abdominal aortic aneurysm, without rupture, unspecified: Secondary | ICD-10-CM | POA: Diagnosis not present

## 2022-06-23 DIAGNOSIS — R2689 Other abnormalities of gait and mobility: Secondary | ICD-10-CM | POA: Diagnosis not present

## 2022-06-23 DIAGNOSIS — E1169 Type 2 diabetes mellitus with other specified complication: Secondary | ICD-10-CM

## 2022-06-23 DIAGNOSIS — Z9181 History of falling: Secondary | ICD-10-CM | POA: Insufficient documentation

## 2022-06-23 DIAGNOSIS — N1831 Chronic kidney disease, stage 3a: Secondary | ICD-10-CM | POA: Diagnosis not present

## 2022-06-23 DIAGNOSIS — R609 Edema, unspecified: Secondary | ICD-10-CM | POA: Insufficient documentation

## 2022-06-23 DIAGNOSIS — E785 Hyperlipidemia, unspecified: Secondary | ICD-10-CM | POA: Insufficient documentation

## 2022-06-23 DIAGNOSIS — K760 Fatty (change of) liver, not elsewhere classified: Secondary | ICD-10-CM | POA: Diagnosis not present

## 2022-06-23 DIAGNOSIS — E1142 Type 2 diabetes mellitus with diabetic polyneuropathy: Secondary | ICD-10-CM

## 2022-06-23 DIAGNOSIS — F17210 Nicotine dependence, cigarettes, uncomplicated: Secondary | ICD-10-CM | POA: Insufficient documentation

## 2022-06-23 DIAGNOSIS — E1122 Type 2 diabetes mellitus with diabetic chronic kidney disease: Secondary | ICD-10-CM | POA: Diagnosis not present

## 2022-06-23 DIAGNOSIS — Z8679 Personal history of other diseases of the circulatory system: Secondary | ICD-10-CM | POA: Insufficient documentation

## 2022-06-23 DIAGNOSIS — Z716 Tobacco abuse counseling: Secondary | ICD-10-CM | POA: Diagnosis not present

## 2022-06-23 DIAGNOSIS — N183 Chronic kidney disease, stage 3 unspecified: Secondary | ICD-10-CM | POA: Diagnosis not present

## 2022-06-23 DIAGNOSIS — G4730 Sleep apnea, unspecified: Secondary | ICD-10-CM | POA: Insufficient documentation

## 2022-06-23 DIAGNOSIS — I700 Atherosclerosis of aorta: Secondary | ICD-10-CM | POA: Insufficient documentation

## 2022-06-23 DIAGNOSIS — Z720 Tobacco use: Secondary | ICD-10-CM

## 2022-06-23 DIAGNOSIS — E1165 Type 2 diabetes mellitus with hyperglycemia: Secondary | ICD-10-CM | POA: Insufficient documentation

## 2022-06-23 DIAGNOSIS — I7143 Infrarenal abdominal aortic aneurysm, without rupture: Secondary | ICD-10-CM

## 2022-06-23 DIAGNOSIS — I10 Essential (primary) hypertension: Secondary | ICD-10-CM | POA: Diagnosis not present

## 2022-06-23 DIAGNOSIS — J432 Centrilobular emphysema: Secondary | ICD-10-CM | POA: Diagnosis not present

## 2022-06-23 DIAGNOSIS — Z794 Long term (current) use of insulin: Secondary | ICD-10-CM

## 2022-06-23 LAB — GLUCOSE, POCT (MANUAL RESULT ENTRY): POC Glucose: 137 mg/dl — AB (ref 70–99)

## 2022-06-23 NOTE — Assessment & Plan Note (Signed)
No evidence of recurrent stroke

## 2022-06-23 NOTE — Telephone Encounter (Signed)
I spoke to Reginald Halsted, RN/CNP and informed her of patient's upcoming PT evaluation for power chair on 07/01/2022 at 1015 at [facility].  She said she will speak with Reginald Rodriguez to insure that he has transportation to that appointment.   I told her that I spoke to the patient when he was in the clinic this morning and reminded Reginald Rodriguez of the appointment.
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
## 2022-06-23 NOTE — Assessment & Plan Note (Signed)
COPD seen on CT scan continue inhaled medications

## 2022-06-23 NOTE — Assessment & Plan Note (Signed)
Continue gabapentin.

## 2022-06-23 NOTE — Assessment & Plan Note (Signed)
Blood pressure well controlled no changes made ?

## 2022-06-23 NOTE — Assessment & Plan Note (Signed)
History of abdominal aortic aneurysm aortic atherosclerosis seen on CT continue with statin therapy

## 2022-06-23 NOTE — Assessment & Plan Note (Signed)
Continue with statin therapy.  ?

## 2022-06-23 NOTE — Assessment & Plan Note (Signed)
A1c slowly improving continue current program as outlined per endocrinology refills given

## 2022-06-23 NOTE — Assessment & Plan Note (Signed)
Monitor renal function

## 2022-06-23 NOTE — Congregational Nurse Program (Signed)
  Dept: [phone]   Congregational Nurse Program Note  Date of Encounter: 06/23/2022  Clinic visit for follow up from PCP appointment this AM.  Blood glucose was 131, weight 263, to continue with current medication regimen. Called [employer] transportation line for resident and arranged transport for neuro-rehabilitation evaluation on 07/01/22 while he was at clinic.  He is to be picked up at 935 AM for the appointment.  Reviewed lunch and dinner meal plan for today and encouraged to continue to add more vegetables particularly green vegetables to diet. Past Medical History: Past Medical History:  Diagnosis Date   Diabetes mellitus without complication ([place])    Hypertension    Stroke [facility])     Encounter Details:  CNP Questionnaire - 06/23/22 1550       Questionnaire   Ask client: Do you give verbal consent for me to treat you today? Yes    Student Assistance N/A    Location Patient [place]    Visit Setting with Client Organization    Patient Status Unknown   Has apartment at [facility] Medicare;Medicaid    Insurance/Financial Assistance Referral N/A    Medication N/A    Medical Provider Yes    Screening Referrals Made N/A    Medical Referrals Made N/A    Medical Appointment Made N/A    Recently w/o PCP, now 1st time PCP visit completed due to CNs referral or appointment made N/A    Food N/A    Transportation Need transportation assistance;Provided transportation assistance    Housing/Utilities N/A    Interpersonal Safety N/A    Interventions Educate;Counsel;Advocate/Support;Spiritual Care    Abnormal to Normal Screening Since Last CN Visit N/A    Sent Client to Lab for: N/A    Did client attend any of the following based off CNs referral or appointments made? Transportation    ED Visit Averted N/A    Life-Saving Intervention Made N/A      Questionnaire   Do you give verbal consent to treat you today? Yes    Location Patient  Served  Not Applicable    Visit Setting Church or Organization    Patient Status Unknown    Insurance Medicare    Insurance Referral N/A    Medication N/A    Screening Referrals N/A    Intervention Blood pressure;Blood glucose;Counsel;Educate

## 2022-06-23 NOTE — Assessment & Plan Note (Signed)
    Current smoking consumption amount: Several cigarettes a day  Dicsussion on advise to quit smoking and smoking impacts: Cardiovascular impact  Patient's willingness to quit: Willing to quit  Methods to quit smoking discussed: Nicotine replacement  Medication management of smoking session drugs discussed: Nicotine replacement  Resources provided:  AVS   Setting quit date not established  Follow-up arranged 4 months   Time spent counseling the patient: 5 minutes

## 2022-06-23 NOTE — Patient Instructions (Signed)
No medication changes  Return 4 months

## 2022-07-01 ENCOUNTER — Ambulatory Visit: Payer: Medicare HMO | Attending: Critical Care Medicine

## 2022-07-01 DIAGNOSIS — R6889 Other general symptoms and signs: Secondary | ICD-10-CM | POA: Diagnosis not present

## 2022-07-01 DIAGNOSIS — Z789 Other specified health status: Secondary | ICD-10-CM | POA: Diagnosis not present

## 2022-07-01 DIAGNOSIS — Z7409 Other reduced mobility: Secondary | ICD-10-CM | POA: Insufficient documentation

## 2022-07-01 NOTE — Therapy (Signed)
OUTPATIENT PHYSICAL THERAPY WHEELCHAIR EVALUATION   Patient Name: Reginald Rodriguez MRN: [id number] DOB:18-Jan-1951, 71 y.o., male Today's Date: 07/01/2022  END OF SESSION:  PT End of Session - 07/01/22 1028     Visit Number 1    Number of Visits 1    PT Start Time 1015    PT Stop Time 1100    PT Time Calculation (min) 45 min    Equipment Utilized During Treatment Gait belt    Activity Tolerance Patient tolerated treatment well    Behavior During Therapy WFL for tasks assessed/performed             Past Medical History:  Diagnosis Date   Diabetes mellitus without complication ([place])    Hypertension    Stroke ([place])    History reviewed. No pertinent surgical history. Patient Active Problem List   Diagnosis Date Noted   Impaired mobility and activities of daily living 03/02/2022   Stress incontinence 12/31/2021   Type 2 diabetes mellitus with diabetic polyneuropathy, with long-term current use of insulin ([place]) 03/13/2021   Type 2 diabetes mellitus with stage 3b chronic kidney disease, with long-term current use of insulin ([place]) 03/13/2021   Obesity, Class III, BMI 40-49.9 (morbid obesity) ([place]) 12/18/2020   Hyperlipidemia associated with type 2 diabetes mellitus ([place]) 12/17/2020   Tobacco use 06/27/2020   History of cocaine abuse ([place]) 05/13/2020   Fatty liver 05/13/2020   Central sleep apnea 05/13/2020   Arthritis:Knees, right shoulder 05/13/2020   COPD with emphysema ([place]) 05/13/2020   Erectile dysfunction 05/13/2020   OSA (obstructive sleep apnea) 05/13/2020   Onychomycosis of multiple toenails with type 2 diabetes mellitus ([place]) 05/13/2020   AAA (abdominal aortic aneurysm) ([place]) 05/13/2020   CKD (chronic kidney disease) stage 3, GFR 30-59 ml/min ([place]) 04/16/2020   Poor dentition 04/15/2020   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 Asencion Noble  THERAPY DIAG:  No diagnosis found.  Rationale for Evaluation and Treatment Rehabilitation  SUBJECTIVE:                                                                                                                                                                                           SUBJECTIVE STATEMENT: Pt present or wheelchair evaluation. Pt reports he has been using a rollator but in recent months, his mobility has significantly declined due to increasing weakness in his UE and LE and has reported of multiple falls and near falls. Patient has  hx of stroke 7 years ago that affected his R UE and R LE and he has difficulty with speaking as well.  Pt is reporting increasing difficulties with short distance ambulation, transfers which is impacting his ADLs and IADLs. Limited mobility and SOB is limiting him from community activities as well. Pt is reporting of recent weight gain as well. Pt lives alone in an apartment and relies on transportation to get around. Pt has aide that comes 3x/week that helps with cooking, transportation, cleaning, bathing etc.   PRECAUTIONS: Fall  WEIGHT BEARING RESTRICTIONS No    OCCUPATION: retired/disabled  PLOF:  Requires assistive device for independence, Needs assistance with ADLs, and Needs assistance with homemaking  PATIENT GOALS: Obtain a power mobility device.         MEDICAL HISTORY:  Primary diagnosis onset: 05/12/22 (date of referral) Diagnosis  Code:  Diagnosis:    Diagnosis code:       Diagnosis:  Z74.09,Z78.9 (ICD-10-CM) - Impaired mobility and activities of daily living Diagnosis  Code:  Diagnosis:   '[]'$ Progressive disease  Relevant future surgeries:     Height: '5\' 7"'$  Weight: 257 Explain recent changes or trends in weight:  8 lbs increase in last 6 months    History:  Past Medical History:  Diagnosis Date   Diabetes mellitus without complication  ([name])    Hypertension    Stroke ([name])             Cardio Status:  Functional Limitations: difficulty with prolonged walking, standing  '[]'$ Intact  '[x]'$  Impaired      Respiratory Status:  Functional Limitations:   '[]'$ Intact  '[x]'$ Impaired   '[x]'$ SOB '[x]'$ COPD '[]'$ O2 Dependent ______LPM  '[]'$ Ventilator Dependent  Resp equip:                                                     Objective Measure(s):   Orthotics:   '[]'$ Amputee:                                                             '[]'$ Prosthesis:        HOME ENVIRONMENT:  '[]'$ House '[]'$ Condo/town home '[x]'$ Apartment '[]'$ Asst living '[]'$ LTCF         '[]'$ Own  '[]'$ Rent   '[x]'$ Lives alone '[]'$ Lives with others -                             Hours without assistance:   '[x]'$ Home is accessible to patient                                 Storage of wheelchair:  '[x]'$ In home   '[]'$ Other Comments:        COMMUNITY :  TRANSPORTATION:  '[]'$ Car '[]'$ Van '[x]'$ Public Transportation '[]'$ Adapted w/c Lift '[]'$  Ambulance '[]'$ Other:                     '[]'$ Sits in wheelchair during transport   Where is w/c stored during transport?  '[x]'$ [place]  '[]'$  [employer] pilot  r   '[]'$ Self-Driver  Drive while in  Wheelchair '[x]'$ yes '[]'$ no   Employment and/or school:  Specific requirements pertaining to mobility        Other:  COMMUNICATION:  Verbal Communication  '[]'$ WFL '[]'$ receptive '[]'$ WFL '[]'$ expressive '[]'$ Understandable  '[x]'$ Difficult to understand  '[]'$ non-communicative  Primary Language:_____English_________ 2nd:_____________  Communication provided by:'[x]'$ Patient '[]'$ Family '[]'$ Caregiver '[]'$ Translator   '[]'$ Uses an augmentative communication device     Manufacturer/Model :                                                                MOBILITY/BALANCE:  Sitting Balance  Standing Balance  Transfers  Ambulation   '[x]'$ WFL      '[]'$ WFL  '[]'$ Independent  '[]'$  Independent   '[]'$ Uses UE for balance in sitting Comments:  '[x]'$ Uses UE/device for stability Comments:  '[x]'$  Min assist  '[x]'$  Ambulates independently with        device:__Rollator_________________      '[]'$  Mod assist  '[]'$  Able to ambulate ______ feet        safely/functionally/independently   '[]'$  Min assist  '[]'$  Min assist  '[]'$  Max assist  '[]'$  Non-functional ambulator         History/High risk of falls   '[]'$  Mod assist  '[]'$  Mod assist  '[]'$  Dependent  '[]'$  Unable to ambulate   '[]'$  Max  assist  '[]'$  Max assist  Transfer method:'[]'$ 1 person '[]'$ 2 person '[]'$ sliding board '[]'$ squat pivot '[]'$ stand pivot '[]'$ mechanical patient lift  '[]'$ other:   '[]'$  Unable  '[]'$  Unable    Fall History: # of falls in the past 6 months? 2 # of "near" falls in the past 6 months? 1-2 /day    CURRENT SEATING / MOBILITY:  Current Mobility Device: '[]'$ None '[x]'$ Cane/Walker '[]'$ Manual '[]'$ Dependent '[]'$ Dependent w/ Tilt rScooter  '[]'$ Power (type of control):   Manufacturer:  Model:  Serial #:   Size:  Color:  Age:   Purchased by whom:   Current condition of mobility base:    Current seating system:                                                                       Age of seating system:    Describe posture in present seating system:    Is the current mobility meeting medical necessity?:  '[]'$ Yes '[x]'$ No Describe: Requires few tries to stand up from chair, walker is limiting with short distance and community ambulation                                    Ability to complete Mobility-Related Activities of Daily Living (MRADL's) with Current Mobility Device:   Move room to room  '[x]'$ Independent  '[]'$ Min '[]'$ Mod '[]'$ Max assist  '[]'$ Unable  Comments:   Meal prep  '[]'$ Independent  '[]'$ Min '[]'$ Mod '[x]'$ Max assist  '[]'$ Unable    Feeding  '[x]'$ Independent  '[]'$ Min '[]'$ Mod '[]'$ Max assist  '[]'$ Unable    Bathing  '[]'$ Independent  '[]'$ Min '[x]''[]'$ Mod Max assist  '[]'$ Unable    Grooming  '[]'$ Independent  '[x]'$ Min '[]'$ Mod '[]'$ Max  assist  '[]'$ Unable    UE dressing  '[]'$ Independent  '[]'$ Min '[]'$ Mod '[x]'$ Max assist  '[]'$ Unable    LE dressing  '[]'$ Independent   '[]'$ Min '[]'$ Mod '[]'$ Max assist  '[x]'$ Unable    Toileting  '[x]'$ Independent  '[]'$ Min '[]'$ Mod '[]'$ Max assist  '[]'$ Unable    Bowel Mgt: '[x]'$  Continent '[]'$   Incontinent '[]'$  Accidents '[]'$  Diapers '[]'$  Colostomy '[]'$  Bowel Program:  Bladder Mgt: '[x]'$  Continent '[]'$  Incontinent '[]'$  Accidents '[]'$  Diapers '[]'$  Urinal '[]'$  Intermittent Cath '[]'$  Indwelling Cath '[]'$  Supra-pubic Cath     Current Mobility Equipment Trialed/ Ruled Out:    Does not meet mobility needs due to:    Mark all boxes that indicate inability to use the specific equipment listed     Meets needs for safe  independent functional  ambulation  / mobility    Risk of  Falling or History of Falls    Enviromental limitations      Cognition    Safety concerns with  physical ability    Decreased / limitations endurance  & strength     Decreased / limitations  motor skills  & coordination    Pain    Pace /  Speed    Cardiac and/or  respiratory condition    Contra - indicated by diagnosis   Cane/Crutches  '[]'$   '[x]'$   '[x]'$   '[]'$   '[x]'$   '[x]'$   '[x]'$   '[x]'$   '[]'$   '[]'$   '[]'$    Walker / Rollator  '[]'$  NA   '[]'$   '[x]'$   '[]'$   '[]'$   '[x]'$   '[x]'$   '[x]'$   '[x]'$   '[]'$   '[]'$   '[]'$     Manual Wheelchair [id number]:  '[]'$  NA  '[]'$   '[]'$   '[]'$   '[]'$   '[]'$   '[]'$   '[]'$   '[]'$   '[]'$   '[]'$   '[x]'$    Manual W/C (K0005) with power assist  '[]'$  NA  '[]'$   '[]'$   '[]'$   '[]'$   '[]'$   '[]'$   '[]'$   '[]'$   '[]'$   '[]'$   '[]'$    Scooter  '[]'$  NA  '[]'$   '[]'$   '[]'$   '[]'$   '[]'$   '[]'$   '[]'$   '[]'$   '[]'$   '[]'$   '[]'$    Power Wheelchair: standard joystick  '[]'$  NA  '[x]'$   '[]'$   '[]'$   '[]'$   '[]'$   '[]'$   '[]'$   '[]'$   '[]'$   '[]'$   '[]'$    Power Wheelchair: alternative controls  '[]'$  NA  '[]'$   '[]'$   '[]'$   '[]'$   '[]'$   '[]'$   '[]'$   '[]'$   '[]'$   '[]'$   '[]'$    Summary:  The least costly alternative for independent functional mobility was found to be:    '[]'$  Crutch/Cane  '[]'$  Walker '[]'$  Manual w/c  '[]'$  Manual w/c with power assist   '[]'$  Scooter   '[]'$  Power w/c std joystick   '[]'$  Power w/c alternative control        '[]'$  Requires dependent care mobility device   Media planner for [employer] skills are adequate for safe mobility equipment operation  '[]'$   Yes '[]'$   No  Patient is willing and motivated to use recommended mobility equipment  '[]'$   Yes '[]'$   No       '[]'$  Patient is unable to safely  operate mobility equipment independently and requires dependent care equipment Comments:           SENSATION and SKIN ISSUES:  Sensation '[]'$  Intact  '[]'$  Impaired '[]'$  Absent '[]'$  Hyposensate '[]'$  Hypersensate  '[]'$  Defensiveness  Location(s) of impairment:    Pressure Relief Method(s):  '[]'$   Lean side to side to offload (without risk of falling)  '[]'$   W/C push up (4+ times/hour for 15+ seconds) '[]'$  Stand up (without risk of falling)    '[]'$  Other: (Describe): Effective pressure relief method(s) above can be performed consistently throughout the day: rYes  r No If not, Why?:  Skin Integrity Risk:       '[]'$  Low risk           '[]'$  Moderate risk            '[]'$  High risk  If high risk, explain:   Skin Issues/Skin Integrity  Current skin Issues  '[]'$  Yes '[]'$  No '[]'$  Intact  '[]'$   Red area   '[]'$   Open area  '[]'$  Scar tissue  '[]'$  At risk from prolonged sitting  Where: History of Skin Issues  '[]'$  Yes '[]'$  No Where : When: Stage: Hx of skin flap surgeries  '[]'$  Yes '[]'$  No Where:  When:  Pain: '[x]'$  Yes '[]'$  No   Pain Location(s): R lower leg Intensity scale: (0-10) :8/10, neuropathic pain from hx of CVA and diabetic neuropathy How does pain interfere with mobility and/or MRADLs? - difficulty with walking, transfers        MAT EVALUATION:  Neuro-Muscular Status: (Tone, Reflexive, Responses, etc.)     '[x]'$   Intact   '[]'$  Spasticity:  '[]'$  Hypotonicity  '[]'$  Fluctuating  '[]'$  Muscle Spasms  '[]'$  Poor Righting Reactions/Poor Equilibrium Reactions  '[]'$  Primal Reflex(s):    Comments:            COMMENTS:    POSTURE:     Comments:  Pelvis Anterior/Posterior:  '[x]'$  Neutral   '[]'$  Posterior  '[]'$  Anterior  '[]'$  Fixed - No movement '[]'$  Tendency away from neutral '[]'$  Flexible '[]'$  Self-correction '[]'$  External correction Obliquity (viewed from front)  '[x]'$  WFL '[]'$  R Obliquity '[]'$  L Obliquity  '[]'$  Fixed - No movement '[]'$  Tendency away from neutral '[]'$  Flexible '[]'$  Self-correction '[]'$  External correction Rotation  '[x]'$  WFL '[]'$  R  anterior '[]'$  L anterior  '[]'$  Fixed - No movement '[]'$  Tendency away from neutral '[]'$  Flexible '[]'$  Self-correction '[]'$  External correction Tonal Influence Pelvis:  '[x]'$  Normal '[]'$  Flaccid '[]'$  Low tone '[]'$  Spasticity '[]'$  Dystonia '[]'$  Pelvis thrust '[]'$  Other:    Trunk Anterior/Posterior:  '[x]'$  WFL '[]'$  Thoracic kyphosis '[]'$  Lumbar lordosis  '[]'$  Fixed - No movement '[]'$  Tendency away from neutral '[]'$  Flexible '[]'$  Self-correction '[]'$  External correction  '[x]'$  WFL '[]'$  Convex to left  '[]'$  Convex to right '[]'$  S-curve   '[]'$  C-curve '[]'$  Multiple curves '[]'$  Tendency away from neutral '[]'$  Flexible '[]'$  Self-correction '[]'$  External correction Rotation of shoulders and upper trunk:  '[x]'$  Neutral '[]'$  Left-anterior '[]'$  Right- anterior '[]'$  Fixed- no movement '[]'$  Tendency away from neutral '[]'$  Flexible '[]'$  Self correction '[]'$  External correction Tonal influence Trunk:  '[x]'$  Normal '[]'$  Flaccid '[]'$  Low tone '[]'$  Spasticity '[]'$  Dystonia '[]'$  Other:   Head & Neck  '[x]'$  Functional '[]'$  Flexed    '[]'$  Extended '[]'$  Rotated right  '[]'$  Rotated left '[]'$  Laterally flexed right '[]'$  Laterally flexed left '[]'$  Cervical hyperextension   '[x]'$  Good head control '[]'$  Adequate head control '[]'$  Limited head control '[]'$  Absent head control Describe tone/movement of head and neck:      Lower Extremity Measurements: LE ROM:  Active ROM Right 07/01/2022 Left 07/01/2022  Hip flexion    Hip extension    Hip abduction    Hip adduction    Knee flexion  Knee extension    Ankle dorsiflexion    Ankle plantarflexion     (Blank rows = not tested)  LE MMT:  MMT Right 07/01/2022 Left 07/01/2022  Hip flexion 3+ 5  Hip extension    Hip abduction 3+ 5  Hip adduction    Knee flexion 3+ 5  Knee extension 3+ 5  Ankle dorsiflexion 3+ 5  Ankle plantarflexion     (Blank rows = not tested)  Hip positions:  '[x]'$  Neutral   '[]'$  Abducted   '[]'$  Adducted  '[]'$  Subluxed   '[]'$  Dislocated   '[]'$  Fixed   '[]'$  Tendency away from neutral '[]'$  Flexible '[]'$   Self-correction '[]'$  External correction   Hip Windswept:'[x]'$  Neutral  '[]'$  Right    '[]'$  Left  '[]'$  Subluxed   '[]'$  Dislocated   '[]'$  Fixed   '[]'$  Tendency away from neutral '[]'$  Flexible '[]'$  Self-correction '[]'$  External correction  LE Tone: '[x]'$  Normal '[]'$  Low tone '[]'$  Spasticity '[]'$  Flaccid '[]'$  Dystonia '[]'$  Rocks/Extends at hip '[]'$  Thrust into knee extension '[]'$  Pushes legs downward into footrest  Foot positioning: ROM Concerns: Dorsiflexed: '[]'$  Right   '[]'$  Left Plantar flexed: '[]'$  Right    '[]'$  Left Inversion: '[]'$  Right    '[]'$  Left Eversion: '[]'$  Right    '[]'$  Left  LE Edema: '[]'$  1+ (Barely detectable impression when finger is pressed into skin) '[x]'$  2+ (slight indentation. 15 seconds to rebound) '[]'$  3+ (deeper indentation. 30 seconds to rebound) '[]'$  4+ (>30 seconds to rebound)  UE Measurements:  UPPER EXTREMITY ROM: Moderately limited with R shoulder flexion, external rotation and internal rotation   UPPER EXTREMITY MMT:  MMT Right 07/01/2022 Left 07/01/2022  Shoulder flexion 3+ 5  Shoulder abduction 3+ 5  Shoulder adduction    Elbow flexion 3+ 5  Elbow extension 3+ 5  Wrist flexion 3+ 5  Wrist extension 3+ 5  Pinch strength    Grip strength    (Blank rows = not tested)  Shoulder Posture:  Right Tendency towards Left  '[x]'$   Functional '[x]'$    '[]'$   Elevation '[]'$    '[]'$   Depression '[]'$    '[]'$   Protraction '[]'$    '[]'$   Retraction '[]'$    '[]'$   Internal rotation '[]'$    '[]'$   External rotation '[]'$    '[]'$   Subluxed '[]'$     UE Tone: '[x]'$  Normal '[]'$  Flaccid '[]'$  Low tone '[]'$  Spasticity  '[]'$  Dystonia '[]'$  Other:   UE Edema: '[x]'$  1+ (Barely detectable impression when finger is pressed into skin) '[]'$  2+ (slight indentation. 15 seconds to rebound) '[]'$  3+ (deeper indentation. 30 seconds to rebound) '[]'$  4+ (>30 seconds to rebound)  Wrist/Hand: Handedness: '[x]'$  Right   '[]'$  Left   '[]'$  NA: Comments:  Right  Left  '[x]'$   WNL '[x]'$    '[]'$   Limitations '[]'$    '[]'$   Contractures '[]'$    '[]'$   Fisting '[]'$    '[]'$   Tremors '[]'$    '[]'$   Weak grasp '[]'$     '[]'$   Poor dexterity '[]'$    '[]'$   Hand movement non functional '[]'$    '[]'$   Paralysis '[]'$         MOBILITY BASE RECOMMENDATIONS and JUSTIFICATION:  MOBILITY BASE  JUSTIFICATION   Manufacturer:   English as a second language teacher:  [employer]                            Color:  Seat Width:  20" Seat Depth 18"   '[]'$  Manual mobility base (continue below)   '[]'$   Scooter/POV  '[x]'$  Power mobility base   Number of hours per day spent in above selected mobility base: 18+  Typical daily mobility base use Schedule: during the day   '[x]'$  is not a safe, functional ambulator  '[x]'$  limitation prevents from completing a MRADL(s) within a reasonable time frame    '[x]'$  limitation places at high risk of morbidity or mortality secondary to  the attempts to perform a    MRADL(s)  '[]'$  limitation prevents accomplishing a MRADL(s) entirely  '[x]'$  provide independent mobility  '[x]'$  equipment is a lifetime medical need  '[x]'$  walker or cane inadequate  '[x]'$  any type manual wheelchair      inadequate  '[]'$  scooter/POV inadequate      '[]'$  requires dependent mobility          MANUAL MOBILITY      '[]'$  Standard manual wheelchair  K0001      Arm:    '[]'$  both '[]'$  right  '[]'$  left      Foot:   '[]'$  both '[]'$  right   '[]'$  left  '[]'$  self-propels wheelchair  '[]'$  will use on regular basis  '[]'$  chair fits throughout home  '[]'$  willing and motivated to use  '[]'$  propels with assistance     '[]'$  dependent use   '[]'$  Standard hemi-manual wheelchair  K0002      Arm:    '[]'$  both '[]'$  right  '[]'$  left      Foot:   '[]'$  both '[]'$  right   '[]'$  left  '[]'$  lower seat height required to foot propel  '[]'$  short stature  '[]'$  self-propels wheelchair  '[]'$  will use on regular basis  '[]'$  chair fits throughout home  '[]'$  willing and motivated to use   '[]'$  propels with assistance  '[]'$  dependent use   '[]'$  Lightweight manual wheelchair  K0003      Arm:    '[]'$  both '[]'$  right  '[]'$  left      Foot:   '[]'$  both  '[]'$  right  '[]'$  left                   '[]'$  hemi height required  '[]'$  medical condition and weight of   wheelchair affect ability to self      propel standard manual wheelchair in the residence  '[]'$  can and does self-propel (marginal propulsion skills)  '[]'$  daily use _________hours  '[]'$  chair fits throughout home  '[]'$  willing and motivated to use  '[]'$  lower seat height required to foot propel  '[]'$  short stature   '[]'$  High strength lightweight manual  wheelchair (Breezy Ultra 4)  K0004     Arm:    '[]'$  both '[]'$  right  '[]'$  left     Foot:   '[]'$  both '[]'$  right   '[]'$  left                                                                  '[]'$  hemi height required '[]'$  medical condition and weight of wheelchair affect ability to self propel while engaging in frequent MRADL(s) that cannot be performed in a standard or lightweight manual wheelchair  '[]'$  daily use _________hours  '[]'$  chair fits throughout home  '[]'$  willing and motivated to use  '[]'$  prevent repetitive use injuries   '[]'$   lower seat height required to foot propel  '[]'$  short stature    '[]'$  Ultra-lightweight manual wheelchair  K0005     Arm:    '[]'$  both '[]'$  right  '[]'$  left     Foot:   '[]'$  both '[]'$  right  '[]'$  left       '[]'$  hemi height required  '[]'$  heavy duty    Front seat to floor _____ inches      Rear seat to floor _____ inches      Back height _____ inches     Back angle ______ degrees      Front angle _____ degrees  '[]'$   full-time manual wheelchair user  '[]'$  Requires individualized fitting and optimal adjustments for multiple features that include adjustable axle configuration, fully adjustable center of gravity, wheel camber, seat and back angle, angle of seat slope, which cannot be accommodated by a K0001 through K0004 manual wheelchair  '[]'$  prevent repetitive use injuries  '[]'$  daily use_________hours   '[]'$  user has high activity patterns that frequently require  them  to go out into the community for the purpose of independently accomplishing high level MRADL activities. Examples of these might include a combination of; shopping, work, school, Science writer,  childcare, independently loading and unloading from a vehicle etc.  '[]'$  lower seat height required to foot propel  '[]'$  short stature  '[]'$  heavy duty -  weight over 250lbs   '[]'$  Current chair is a K0005   manufacture:___________________  model:_________________  serial#____________________  age:_________    '[]'$  First time [id number] user (complete trial)  K0004 time and # of strokes to propel 30 feet: ________seconds _________strokes  [id number] time and # of strokes to propel 30 feet: ________seconds _________strokes  What was the result of the trial between the K0004 and K0005 manual wheelchair? ___    What features of the K0005 w/c are needed as compared to the K0004 base? Why?___    '[]'$  adjustable seat and back angle changes the angle of seat slope of the frame to attain a gravity assisted position for efficient propulsion and proper weight distribution along the frame     '[]'$  the front of the wheelchair will be configured higher than the back of the chair to allow gravity to assist the user with postural stability  '[]'$  the center of the wheel will be positioned for stability, safety and efficient propulsion  '[]'$  adjustable axle allows for vertical, horizontal, camber and overall width changes  throughout the wheels for adjustment of the client's exact needs and abilities.   '[]'$  adjustable axle increases the stability and function of the chair allowing for adjustment of the center of gravity.   '[]'$  accommodates the client's anatomical position in the chair maximizing independence in mobility and maneuverability in all environments.   '[]'$  create a minimal fixed tilt-in space to assist in positioning.   '[]'$  Describe users full-time manual wheelchair activity patterns:___    '[]'$  Power assist Comments:  '[]'$  prevent repetitive use injuries  '[]'$  repetitive strain injury present in    shoulder girdle    '[]'$  shoulder pain is (> or =) to 7/10     during manual propulsion       Current Pain _____/10  '[]'$  requires  conservation of energy to participate in MRADL(s) runable to propel up ramps or curbs using manual wheelchair  '[]'$  been K0005 user greater than one year  '[]'$  user unwilling to use power      wheelchair (reason): '[]'$  less expensive option  to power   wheelchair   '[]'$  rim activated power assist -      decreased strength   '[]'$  Heavy duty manual wheelchair       K0006     Arm:    '[]'$  both '[]'$  right  '[]'$  left     Foot:   '[]'$  both '[]'$  right  '[]'$  left     '[]'$  hemi height required    '[]'$  Dependent base  '[]'$  user exceeds 250lbs  '[]'$  non-functional ambulator    '[]'$  extreme spasticity  '[]'$  over active movement   '[]'$  broken frame/hx of repeated     repairs  '[]'$  able to self-propel in residence       '[]'$  lower seat to floor height required  '[]'$  unable to self-propel in residence   '[]'$  Extra heavy duty manual wheelchair  K0007     Arm:    '[]'$  both '[]'$  right  '[]'$  left     Foot:   '[]'$  both '[]'$  right  '[]'$  left     '[]'$  hemi height required  '[]'$  Dependent base  '[]'$  user exceeds 300lbs  '[]'$  non-functional ambulator    '[]'$  able to self-propel in residence   '[]'$  lower seat to floor height required  '[]'$  unable to self-propel in residence     '[]'$  Manual wheelchair with tilt [id number]      (Manual "Tilt-n-Space")  '[]'$  patient is dependent for transfers  '[]'$  patient requires frequent       positioning for pressure relief   '[]'$  patient requires frequent      positioning for poor/absent trunk control        '[]'$  Stroller Base  '[]'$  infant/child   '[]'$  unable to propel manual      wheelchair  '[]'$  allows for growth  '[]'$  non-functional ambulator  '[]'$  non-functional UE  '[]'$  independent mobility is not a goal at this time    [place] handles  '[]'$  extended  rangle adjustable   '[]'$  standard  '[]'$  caregiver access  '[]'$  caregiver assist    '[]'$  allows "hooking" to enable      increased ability to perform ADLs or maintain balance   '[]'$  Angle Adjustable Back  '[]'$  postural control  '[]'$  control of tone/spasticity  '[]'$  accommodation of range of motion   '[]'$  UE functional control  '[]'$  accommodation for seating system    Rear wheel placement  '[]'$  std/fixed rfully adjustableramputee   '[]'$  camber ________degree  '[]'$  removable rear wheel  '[]'$  non-removable rear wheel  Wheel size _______  Wheel style_______________________  '[]'$  improved UE access to wheels  '[]'$  increase propulsion ability  '[]'$  improved stability  '[]'$  changing angle in space for      improvement of postural stability  '[]'$  remove for transport    '[]'$  allow for seating system to fit on      base  '[]'$  amputee placement  '[]'$  1-arm drive access   r R  r L  '[]'$  enable propulsion of manual       wheelchair with one arm    '[]'$  amputee placement   Wheel rims/ Hand rims  '[]'$  Standard    '[]'$  Specialized-____ '[]'$  provide ability to propel manual   '[]'$  increase self-propulsion with hand wheelchair weakness/decreased grasp     '[]'$  Spoke protector/guard   '[]'$  prevent hands from getting caught in spokes   Tires:  '[]'$  pneumatic  '[]'$  flat free inserts  '[]'$  solid  Style:  '[]'$   decrease roll resistance              '[]'$  prevent frequent flats  '[]'$  increase shock absorbency  '[]'$  decrease maintenance   '[]'$  decrease pain from road shock    '[]'$  decrease spasms from road shock    Wheel Locks:    '[]'$  push '[]'$  pull '[]'$  scissor  '[]'$  lock wheels for transfers  '[]'$  lock wheels from rolling   Brake/wheel lock extension:  '[]'$  R  '[]'$  L  '[]'$  allow user to operate wheel locks due to decreased reach or strength   Caster housing:  Caster size:                      Style:                                          '[]'$  suspension fork  '[]'$  maneuverability   '[]'$  stability of wheelchair   '[]'$  durability  '[]'$  maintenance  '[]'$  angle adjustment for posture  '[]'$  allow for feet to come under        wheelchair base  '[]'$  allows change in seat to floor      height   '[]'$  increase shock absorbency  '[]'$  decrease pain from road shock  '[]'$  decrease spasms from road    shock   '[]'$  Side guards  '[]'$  prevent clothing getting caught in wheel or becoming soiled   rprovide hip and pelvic stability  '[]'$  eliminates contact between body and wheels  '[]'$  limit hand contact with wheels   '[]'$  Anti-tippers      '[]'$  prevent wheelchair from tipping    backward  '[]'$  assist caregiver with curbs     POWER MOBILITY      '[]'$  Scooter/POV    '[]'$  can safely operate   '[]'$  can safely transfer   '[]'$  has adequate trunk stability   '[]'$  cannot functionally propel  manual wheelchair    '[x]'$  Power mobility base    '[]'$  non-ambulatory   '[x]'$  cannot functionally propel manual wheelchair   '[x]'$  cannot functionally and safely      operate scooter/POV  '[x]'$  can safely operate power       wheelchair  '[x]'$  home is accessible  '[x]'$  willing to use power wheelchair     Tilt  '[]'$  Powered tilt on powered chair  '[]'$  Powered tilt on manual chair  '[]'$  Manual tilt on manual chair Comments:  '[]'$  change position for pressure      '[]'$  elief/cannot weight shift   '[]'$  change position against      gravitational force on head and      shoulders   '[]'$  decrease pain  '[]'$  blood pressure management   '[]'$  control autonomic dysreflexia  '[]'$  decrease respiratory distress  '[]'$  management of spasticity  '[]'$  management of low tone  '[]'$  facilitate postural control   '[]'$  rest periods   '[]'$  control edema  '[]'$  increase sitting tolerance   '[]'$  aid with transfers     Recline   '[]'$  Power recline on power chair  '[]'$  Manual recline on manual chair  Comments:    '[]'$  intermittent catheterization  '[]'$  manage spasticity  '[]'$  accommodate femur to back angle  '[]'$  change position for pressure relief/cannot weight shift rhigh risk of pressure sore development  '[]'$  tilt alone does not accomplish     effective pressure relief, maximum  pressure relief achieved at -      _______ degrees tilt   _______ degrees recline   '[]'$  difficult to transfer to and from bed '[]'$  rest periods and sleeping in chair  '[]'$  repositioning for transfers  '[]'$  bring to full recline for ADL care  '[]'$  clothing/diaper changes in chair  '[]'$  gravity PEG tube feeding  '[]'$  head  positioning  '[]'$  decrease pain  '[]'$  blood pressure management   '[]'$  control autonomic dysreflexia  '[]'$  decrease respiratory distress  '[]'$  user on ventilator     Elevator on mobility base  '[x]'$  Power wheelchair  '[]'$  Scooter  '[x]'$  increase Indep in transfers   '[x]'$  increase Indep in ADLs    '[x]'$  bathroom function and safety  '[x]'$  kitchen/cooking function and safety  '[]'$  shopping  '[x]'$  raise height for communication at standing level  '[]'$  raise height for eye contact which reduces cervical neck strain and pain  '[]'$  drive at raised height for safety and navigating crowds  '[]'$  Other:   '[]'$  Vertical position system  (anterior tilt)     (Drive locks-out)    '[]'$  Stand       (Drive enabled)  '[]'$  independent weight bearing  '[]'$  decrease joint contractures  '[]'$  decrease/manage spasticity  '[]'$  decrease/manage spasms  '[]'$  pressure distribution away from   scapula, sacrum, coccyx, and ischial tuberosity  '[]'$  increase digestion and elimination   '[]'$  access to counters and cabinets  '[]'$  increase reach  '[]'$  increase interaction with others at eye level, reduces neck strain  '[]'$  increase performance of       MRADL(s)      Power elevating legrest    '[]'$  Center mount (Single) 85-170 degrees       '[]'$  Standard (Pair) 100-170 degrees  '[]'$  position legs at 90 degrees, not available with std power ELR  '[]'$  center mount tucks into chair to decrease turning radius in home, not available with std power ELR  '[]'$  provide change in position for LE  '[]'$  elevate legs during recline    '[]'$  maintain placement of feet on      footplate  '[]'$  decrease edema  '[]'$  improve circulation  '[]'$  actuator needed to elevate legrest  '[]'$  actuator needed to articulate legrest preventing knees from flexing  '[]'$  Increase ground clearance over      curbs  '[]'$   STD (pair) independently                     elevate legrest   POWER WHEELCHAIR CONTROLS      Controls/input device  '[]'$  Expandable  '[x]'$  Non-expandable  '[]'$  Proportional  '[x]'$  Right Hand '[]'$  Left Hand  '[]'$   Non-proportional/switches/head-array  '[]'$  Electrical/proximity         '[]'$   Mechanical      Manufacturer:___________________   Type:________________________ '[x]'$  provides access for controlling wheelchair  '[]'$  programming for accurate control  '[]'$  progressive disease/changing condition  '[]'$  required for alternative drive      controls       '[]'$  lacks motor control to operate  proportional drive control  '[]'$  unable to understand proportional controls  '[]'$  limited movement/strength  '[]'$  extraneous movement / tremors / ataxic / spastic       '[]'$  Upgraded electronics controller/harness    '[]'$  Single power (tilt or recline)   '[]'$  Expandable    '[]'$  Non-expandable plus   '[]'$  Multi-power (tilt, recline, power legrest, power seat lift, vertical positioning system, stand)  '[]'$  allows input device  to communicate with drive motors  '[]'$  harness provides necessary connections between the controller, input device, and seat functions     '[]'$  needed in order to operate power seat functions through joystick/ input device  '[]'$  required for alternative drive controls     '[]'$  Enhanced display  '[]'$  required to connect all alternative drive controls   '[]'$  required for upgraded joystick      (lite-throw, heavy duty, micro)  '[]'$  Allows user to see in which mode and drive the wheelchair is set; necessary for alternate controls       '[]'$  Upgraded tracking electronics  '[]'$  correct tracking when on uneven surfaces makes switch driving more efficient and less fatiguing  '[]'$  increase safety when driving  '[]'$  increase ability to traverse thresholds    '[]'$  Safety / reset / mode switches     Type:    '[]'$  Used to change modes and stop the wheelchair when driving     '[]'$  Mount for joystick / input device/switches  '[]'$  swing away for access or transfers   '[]'$  attaches joystick / input device / switches to wheelchair   '[]'$  provides for consistent access  '[]'$  midline for optimal placement    '[]'$  Attendant controlled joystick plus     mount  '[]'$   safety  '[]'$  long distance driving  '[]'$  operation of seat functions  '[]'$  compliance with transportation regulations    '[x]'$  Battery NF22 x 2 '[x]'$  required to power (power assist / scooter/ power wc / other):   '[]'$  Power inverter (24V to 12V)  '[]'$  required for ventilator / respiratory equipment / other:     CHAIR OPTIONS MANUAL & POWER      Armrests   '[x]'$  adjustable height '[]'$  removable  '[]'$  swing away '[]'$  fixed  '[x]'$  flip back  '[]'$  reclining  '[x]'$  full length pads '[]'$  desk '[]'$  tube arms '[]'$  gel pads  '[x]'$  provide support with elbow at 90    '[]'$  remove/flip back/swing away for  transfers  '[]'$  provide support and positioning of upper body    '[]'$  allow to come closer to table top  '[]'$  remove for access to tables  '[]'$  provide support for w/c tray  '[]'$  change of height/angles for       variable activities   '[]'$  Elbow support / Elbow stop  '[]'$  keep elbow positioned on arm pad  '[]'$  keep arms from falling off arm pad  during tilt and/or recline   Upper Extremity Support  '[]'$  Arm trough  '[]'$   R  '[]'$   L  Style:  '[]'$  swivel mount '[]'$  fixed mount   '[]'$  posterior hand support  '[]'$   tray  '[]'$  full tray  '[]'$  joystick cut out  '[]'$   R  '[]'$   L  Style:  '[]'$  decrease gravitational pull on      shoulders  '[]'$  provide support to increase UE  function  '[]'$  provide hand support in natural    position  '[]'$  position flaccid UE  '[]'$  decrease subluxation    '[]'$  decrease edema       '[]'$  manage spasticity   '[]'$  provide midline positioning  '[]'$  provide work surface  '[]'$  placement for AAC/ Computer/ EADL       Hangers/ Legrests   '[]'$  ______ degree  '[]'$  Elevating '[]'$  articulating  '[]'$  swing away '[]'$  fixed '[]'$  lift off  '[]'$  heavy duty '[]'$  adjustable knee angle  '[]'$  adjustable calf panel   '[]'$  longer extension tube              '[]'$   provide LE support  '[]'$  maintain placement of feet on      footplate   '[]'$  accommodate lower leg length  '[]'$  accommodate to hamstring       tightness  '[]'$  enable transfers  '[]'$  provide change in position for LE's  '[]'$  elevate legs  during recline    '[]'$  decrease edema  '[]'$  durability      Foot support   '[x]'$  footplate '[]'$  R '[]'$  L '[x]'$  flip up           '[x]'$  Depth adjustable   '[x]'$  angle adjustable  '[]'$  foot board/one piece    '[x]'$  provide foot support  '[]'$  accommodate to ankle ROM  '[]'$  allow foot to go under wheelchair base  '[]'$  enable transfers     '[]'$  Shoe holders  '[]'$  position foot    '[]'$  decrease / manage spasticity  '[]'$  control position of LE  '[]'$  stability    '[]'$  safety     '[]'$  Ankle strap/heel      loops  '[]'$  support foot on foot support  '[]'$  decrease extraneous movement  '[]'$  provide input to heel   '[]'$  protect foot     '[]'$  Amputee adapter '[]'$  R  '[]'$  L     Style:                  Size:  '[]'$  Provide support for stump/residual extremity    '[]'$  Transportation tie-down  '[]'$  to provide crash tested tie-down brackets    '[]'$  Crutch/cane holder    '[]'$  O2 holder    '[]'$  IV hanger   '[]'$  Ventilator tray/mount    '[]'$  stabilize accessory on wheelchair       Component  Justification     '[]'$  Seat cushion      '[]'$  accommodate impaired sensation  '[]'$  decubitus ulcers present or history  '[]'$  unable to shift weight  '[]'$  increase pressure distribution  '[]'$  prevent pelvic extension  '[]'$  custom required "off-the-shelf"    seat cushion will not accommodate deformity  '[]'$  stabilize/promote pelvis alignment  '[]'$  stabilize/promote femur alignment  '[]'$  accommodate obliquity  '[]'$  accommodate multiple deformity  '[]'$  incontinent/accidents  '[]'$  low maintenance     '[]'$  seat mounts                 '[]'$  fixed '[]'$  removable  '[]'$  attach seat platform/cushion to wheelchair frame    '[]'$  Seat wedge    '[]'$  provide increased aggressiveness of seat shape to decrease sliding  down in the seat  '[]'$  accommodate ROM        '[]'$  Cover replacement   '[]'$  protect back or seat cushion  '[]'$  incontinent/accidents    '[]'$  Solid seat / insert    '[]'$  support cushion to prevent      hammocking  '[]'$  allows attachment of cushion to mobility base    '[]'$  Lateral pelvic/thigh/hip     support (Guides)     '[]'$   decrease abduction  '[]'$  accommodate pelvis  '[]'$  position upper legs  '[]'$  accommodate spasticity  '[]'$  removable for transfers     '[]'$  Lateral pelvic/thigh      supports mounts  '[]'$  fixed   '[]'$  swing-away   '[]'$  removable  '[]'$  mounts lateral pelvic/thigh supports     '[]'$  mounts lateral pelvic/thigh supports swing-away or removable for transfers    '[]'$  Medial thigh support ([place])  '[]'$ decrease adduction  '[]'$ accommodate ROM  '[]'$  remove for transfers   '[]'$  alignment      '[]'$   Medial thigh   '[]'$  fixed      support mounts      '[]'$  swing-away   '[]'$  removable  '[]'$  mounts medial thigh supports   '[]'$  Mounts medial supports swing- away or removable for transfers       Component  Justification   '[]'$  Back       '[]'$  provide posterior trunk support '[]'$  facilitate tone  '[]'$  provide lumbar/sacral support '[]'$  accommodate deformity  '[]'$  support trunk in midline   '[]'$  custom required "off-the-shelf" back support will not accommodate deformity   '[]'$  provide lateral trunk support '[]'$  accommodate or decrease tone            '[]'$  Back mounts  '[]'$  fixed  '[]'$  removable  '[]'$  attach back rest/cushion to wheelchair frame   '[]'$  Lateral trunk      supports  '[]'$  R '[]'$  L  '[]'$  decrease lateral trunk leaning  '[]'$  accommodate asymmetry    '[]'$  contour for increased contact  '[]'$  safety    '[]'$  control of tone    '[]'$  Lateral trunk      supports mounts  '[]'$  fixed  '[]'$  swing-away   '[]'$  removable  '[]'$  mounts lateral trunk supports     '[]'$  Mounts lateral trunk supports swing-away or removable for transfers   '[]'$  Anterior chest      strap, vest     '[]'$  decrease forward movement of shoulder  '[]'$  decrease forward movement of trunk  '[]'$  safety/stability  '[]'$  added abdominal support  '[]'$  trunk alignment  '[]'$  assistance with shoulder control   '[]'$  decrease shoulder elevation    '[]'$  Headrest      '[]'$  provide posterior head support  '[]'$  provide posterior neck support  '[]'$  provide lateral head support  '[]'$  provide anterior head support  '[]'$  support during tilt and recline  '[]'$  improve  feeding     '[]'$  improve respiration  '[]'$  placement of switches  '[]'$  safety    '[]'$  accommodate ROM   '[]'$  accommodate tone  '[]'$  improve visual orientation   '[]'$  Headrest           '[]'$  fixed '[]'$  removable '[]'$  flip down      Mounting hardware   '[]'$  swing-away laterals/switches  '[]'$  mount headrest   '[]'$  mounts headrest flip down or  removable for transfers  '[]'$  mount headrest swing-away laterals   '[]'$  mount switches     '[]'$  Neck Support    '[]'$  decrease neck rotation  '[]'$  decrease forward neck flexion   Pelvic Positioner    '[x]'$  std hip belt          '[]'$  padded hip belt  '[]'$  dual pull hip belt  '[]'$  four point hip belt  '[]'$  stabilize tone  '[]'$  decrease falling out of chair  '[]'$  prevent excessive extension  '[]'$  special pull angle to control      rotation  '[]'$  pad for protection over boney   prominence  '[]'$  promote comfort    '[]'$  Essential needs        bag/pouch   '[]'$  medicines '[]'$  special food rorthotics '[]'$  clothing changes  '[]'$  diapers  '[]'$  catheter/hygiene '[]'$  ostomy supplies   The above equipment has a life- long use expectancy.  Growth and changes in medical and/or functional conditions would be the exceptions.   SUMMARY:  Why mobility device was selected; include why a lower level device is not appropriate:   ASSESSMENT:  Functional testing TUG: 28 sec with rollator Gait speed:  0.43 m/s with rollator 5x sit to stand test: 40.75 with bil UE support, excessive sue of UE with transfers and uncontrolled descent. CLINICAL IMPRESSION: Patient is a 71 y.o. male who was seen today for physical therapy evaluation and treatment for power mobility device assessment due to generalized weakness and increasing frequency of falls. Objective impairment include significant weakness in R UE and R LE grossly, decreased transfers, decreased gait speed, decreased mobility. Increased socre in TUG and gait speed increases risk for fall. Patient is not safe Ambulator with rollator due to UE and LE weakness and increasing difficulties and  increasing risk for falls. Patient is not safe for manual wheelchair propulsion due to generalized weakness combined with his BMI. Based on objective impairments, current functional status, and co-morbidities including (hx of CVA, LE edema, type 2 DM, and COPD), the most suitable device for patient will be Group 2 power chair with seat elevator. which will allow patient for short distance and community ambulation, reduce risk for fall, prserve cardiopulmonary status and improve independence. .    OBJECTIVE IMPAIRMENTS Abnormal gait, decreased activity tolerance, decreased balance, decreased endurance, decreased mobility, difficulty walking, decreased strength, increased muscle spasms, impaired flexibility, impaired sensation, impaired tone, impaired UE functional use, postural dysfunction, obesity, and pain.   ACTIVITY LIMITATIONS carrying, standing, stairs, transfers, bathing, dressing, and hygiene/grooming  PARTICIPATION LIMITATIONS: meal prep, cleaning, laundry, shopping, and community activity  PERSONAL FACTORS Age, Past/current experiences, Time since onset of injury/illness/exacerbation, and 1-2 comorbidities: Hx of CVA, type 2 DM, HTN, aortic anneurysm, COPD, sleep Apnea  are also affecting patient's functional outcome.   REHAB POTENTIAL: Fair    CLINICAL DECISION MAKING: Stable/uncomplicated  EVALUATION COMPLEXITY: Low                                  T GOALS: One time visit. No goals established.    PLAN: PT FREQUENCY: one time visit    Kerrie Pleasure, PT 07/01/2022, 10:59 AM    I concur with the above findings and recommendations of the therapist:  Physician name printed:         Physician's signature:      Date:

## 2022-07-02 DIAGNOSIS — E1165 Type 2 diabetes mellitus with hyperglycemia: Secondary | ICD-10-CM | POA: Diagnosis not present

## 2022-07-06 ENCOUNTER — Other Ambulatory Visit: Payer: Self-pay | Admitting: Critical Care Medicine

## 2022-07-07 DIAGNOSIS — E1165 Type 2 diabetes mellitus with hyperglycemia: Secondary | ICD-10-CM

## 2022-07-07 LAB — GLUCOSE, POCT (MANUAL RESULT ENTRY): POC Glucose: 116 mg/dl — AB (ref 70–99)

## 2022-07-07 NOTE — Congregational Nurse Program (Signed)
  Dept: [phone]   Congregational Nurse Program Note  Date of Encounter: 07/07/2022  Clinic visit for blood pressure and blood glucose checks and follow up from physical therapy assessment appointment.  Per the evaluation he is eligible for an electric wheel chair once MD signs order.  BP 127/93, pulse 88 and regular, O2 Sat 97%, blood glucose 116 2 hrs after lunch.  Has not received new supplies for continuous glucose monitor, checking blood sugar manually twice daily.  Discussed insulin dosage in relation to meals and need to continue to increase intake of vegetables. Past Medical History: Past Medical History:  Diagnosis Date   Diabetes mellitus without complication ([place])    Hypertension    Stroke [facility])     Encounter Details:  CNP Questionnaire - 07/07/22 1455       Questionnaire   Ask client: Do you give verbal consent for me to treat you today? Yes    Student Assistance N/A    Location Patient [place]    Visit Setting with Client Organization    Patient Status Unknown   Has apartment at [facility] Medicare;Medicaid    Insurance/Financial Assistance Referral N/A    Medication N/A    Medical Provider Yes    Screening Referrals Made N/A    Medical Referrals Made N/A    Medical Appointment Made N/A    Recently w/o PCP, now 1st time PCP visit completed due to CNs referral or appointment made N/A    Food N/A    Transportation Need transportation assistance    Housing/Utilities N/A    Interpersonal Safety N/A    Interventions Educate;Counsel;Advocate/Support;Spiritual Care    Abnormal to Normal Screening Since Last CN Visit N/A    Screenings CN Performed Blood Pressure;Blood Glucose;Pulse Ox    Sent Client to Lab for: N/A    Did client attend any of the following based off CNs referral or appointments made? Transportation    ED Visit Averted N/A    Life-Saving Intervention Made N/A      Questionnaire   Do you give verbal  consent to treat you today? Yes    Location Patient Served  Not Applicable    Visit Setting Church or Organization    Patient Status Unknown    Insurance Medicare    Insurance Referral N/A    Medication N/A    Screening Referrals N/A    Intervention Blood pressure;Blood glucose;Counsel;Educate

## 2022-07-10 ENCOUNTER — Telehealth: Payer: Self-pay | Admitting: Critical Care Medicine

## 2022-07-10 NOTE — Telephone Encounter (Signed)
Copied from [place] [phone]. Topic: General - Other >> Jul 10, 2022 11:21 AM Cyndi Bender wrote: Reason for CRM: Amy with [employer] requests PT evaluation paperwork to be sent to fax# [phone] Attention: Estella Husk Please include the Client ID# [id number] and Amy reports that it expires on 08/29/22. Cb# [phone]

## 2022-07-21 ENCOUNTER — Telehealth: Payer: Self-pay

## 2022-07-21 NOTE — Telephone Encounter (Signed)
Signed PT seating eval faxed to [employer].

## 2022-07-21 NOTE — Congregational Nurse Program (Signed)
  Dept: [phone]   Congregational Nurse Program Note  Date of Encounter: 07/21/2022  Clinic visit for assistance with application of Freestyle Libre continuous glucose monitor sensor.  Has been checking glucose levels per fingerstick twice daily, AM level in the low 140's, PM all 180 or below.  Blood pressure 120/91, pulse 90.  Sensor applied to back of upper left forearm with assistance after review of application video. Past Medical History: Past Medical History:  Diagnosis Date   Diabetes mellitus without complication ([place])    Hypertension    Stroke [facility])     Encounter Details:  CNP Questionnaire - 07/21/22 1400       Questionnaire   Ask client: Do you give verbal consent for me to treat you today? Yes    Student Assistance N/A    Location Patient [place]    Visit Setting with Client Organization    Patient Status Unknown   Has apartment at [facility] Medicare;Medicaid    Insurance/Financial Assistance Referral N/A    Medication N/A    Medical Provider Yes    Screening Referrals Made N/A    Medical Referrals Made N/A    Medical Appointment Made N/A    Recently w/o PCP, now 1st time PCP visit completed due to CNs referral or appointment made N/A    Food N/A    Transportation Need transportation assistance    Housing/Utilities N/A    Interpersonal Safety N/A    Interventions Educate;Counsel;Advocate/Support;Spiritual Care;Reviewed Medications    Abnormal to Normal Screening Since Last CN Visit Blood Glucose    Screenings CN Performed Blood Pressure    Sent Client to Lab for: N/A    Did client attend any of the following based off CNs referral or appointments made? N/A    ED Visit Averted N/A    Life-Saving Intervention Made N/A      Questionnaire   Do you give verbal consent to treat you today? Yes    Location Patient Served  Not Applicable    Visit Setting Church or Organization    Patient Status Unknown    Insurance  Medicare    Insurance Referral N/A    Medication N/A    Screening Referrals N/A    Intervention Blood pressure;Blood glucose;Counsel;Educate

## 2022-07-21 NOTE — Telephone Encounter (Signed)
Paperwork faxed °

## 2022-07-30 ENCOUNTER — Other Ambulatory Visit: Payer: Self-pay

## 2022-07-31 ENCOUNTER — Other Ambulatory Visit: Payer: Self-pay

## 2022-08-03 ENCOUNTER — Telehealth: Payer: Self-pay | Admitting: Emergency Medicine

## 2022-08-03 NOTE — Telephone Encounter (Signed)
Copied from [place] [id number]. Topic: General - Other >> Aug 03, 2022  9:21 AM Cyndi Bender wrote: Reason for CRM: Jana Half with [employer] called to see if the delivery documents that were faxed on 07/31/22 was received. Cb# [phone]

## 2022-08-04 NOTE — Telephone Encounter (Signed)
[place] about product description page / wanted to confirm if this was received / please call [phone] asap

## 2022-08-04 NOTE — Telephone Encounter (Signed)
I have received paperwork

## 2022-08-04 NOTE — Telephone Encounter (Signed)
noted 

## 2022-08-06 NOTE — Telephone Encounter (Signed)
Sandy with [employer] called back in checking on the status of the paperwork or form that was faxed in. Please assist further

## 2022-08-11 NOTE — Telephone Encounter (Signed)
Reginald Rodriguez with [employer] called to get a status on the paperwork that was faxed on the 12th. [employer] is waiting on the paperwork so they can submit the claim to patients insurance and deliver unit to patient. Please advise.

## 2022-08-11 NOTE — Congregational Nurse Program (Signed)
Dept: [phone]   Congregational Nurse Program Note  Date of Encounter: 08/11/2022  Clinic visit to check blood pressure and to verify which of medicines need to be refilled.  BP 130/90, pulse 93, O2 Sat 97%.  Weight stable 280, states he is eating more vegetables and drinking water.  He was able to replace Freestyle Libre sensor last week without assistance blood glucose this morning 126.   Has concern that PCP had not signed order for the electric wheel chair that he is to receive, message sent to PCP that order waiting for his signature.   Dept: [phone]   Congregational Nurse Program Note  Date of Encounter: 08/11/2022  Past Medical History: Past Medical History:  Diagnosis Date   Diabetes mellitus without complication ([place])    Hypertension    Stroke [facility])     Encounter Details:  CNP Questionnaire - 08/11/22 1433       Questionnaire   Ask client: Do you give verbal consent for me to treat you today? Yes    Nutritional therapist Nurse    Location Patient [facility]    Visit Setting with Client Organization    Patient Status Unknown   Has apartment at [facility] Medicare;Medicaid    Insurance/Financial Assistance Referral N/A    Medication N/A    Medical Provider Yes    Screening Referrals Made N/A    Medical Referrals Made N/A    Medical Appointment Made N/A    Recently w/o PCP, now 1st time PCP visit completed due to CNs referral or appointment made N/A    Food N/A    Transportation Need transportation assistance    Housing/Utilities N/A    Interpersonal Safety N/A    Interventions Educate;Counsel;Advocate/Support;Spiritual Care;Reviewed Medications    Abnormal to Normal Screening Since Last CN Visit N/A    Screenings CN Performed Blood Pressure;Weight;Pulse Ox    Sent Client to Lab for: N/A    Did client attend any of the following based off CNs referral or appointments made? N/A    ED Visit Averted N/A     Life-Saving Intervention Made N/A      Questionnaire   Do you give verbal consent to treat you today? Yes    Location Patient Served  Not Applicable    Visit Setting Church or Organization    Patient Status Unknown    Insurance Medicare    Insurance Referral N/A    Medication N/A    Screening Referrals N/A    Intervention Blood pressure;Blood glucose;Counsel;Educate               Past Medical History: Past Medical History:  Diagnosis Date   Diabetes mellitus without complication ([place])    Hypertension    Stroke [facility])     Encounter Details:  CNP Questionnaire - 08/11/22 1433       Questionnaire   Ask client: Do you give verbal consent for me to treat you today? Yes    Clinical cytogeneticist    Location Patient [facility]    Visit Setting with Client Organization    Patient Status Unknown   Has apartment at [facility] Medicare;Medicaid    Insurance/Financial Assistance Referral N/A    Medication N/A    Medical Provider Yes    Screening Referrals Made N/A    Medical Referrals Made N/A    Medical Appointment Made N/A    Recently w/o PCP, now 1st  time PCP visit completed due to CNs referral or appointment made N/A    Food N/A    Transportation Need transportation assistance    Housing/Utilities N/A    Interpersonal Safety N/A    Interventions Educate;Counsel;Advocate/Support;Spiritual Care;Reviewed Medications    Abnormal to Normal Screening Since Last CN Visit N/A    Screenings CN Performed Blood Pressure;Weight;Pulse Ox    Sent Client to Lab for: N/A    Did client attend any of the following based off CNs referral or appointments made? N/A    ED Visit Averted N/A    Life-Saving Intervention Made N/A      Questionnaire   Do you give verbal consent to treat you today? Yes    Location Patient Served  Not Applicable    Visit Setting Church or Organization    Patient Status Unknown    Insurance Medicare    Insurance  Referral N/A    Medication N/A    Screening Referrals N/A    Intervention Blood pressure;Blood glucose;Counsel;Educate

## 2022-08-11 NOTE — Telephone Encounter (Signed)
Routing to CMA 

## 2022-08-13 NOTE — Telephone Encounter (Signed)
Paperwork faxed °

## 2022-08-19 DIAGNOSIS — R600 Localized edema: Secondary | ICD-10-CM | POA: Diagnosis not present

## 2022-08-19 DIAGNOSIS — Z7409 Other reduced mobility: Secondary | ICD-10-CM | POA: Diagnosis not present

## 2022-08-19 DIAGNOSIS — J449 Chronic obstructive pulmonary disease, unspecified: Secondary | ICD-10-CM | POA: Diagnosis not present

## 2022-08-19 DIAGNOSIS — I679 Cerebrovascular disease, unspecified: Secondary | ICD-10-CM | POA: Diagnosis not present

## 2022-08-19 DIAGNOSIS — I69359 Hemiplegia and hemiparesis following cerebral infarction affecting unspecified side: Secondary | ICD-10-CM | POA: Diagnosis not present

## 2022-08-25 DIAGNOSIS — R6889 Other general symptoms and signs: Secondary | ICD-10-CM | POA: Diagnosis not present

## 2022-08-25 NOTE — Congregational Nurse Program (Signed)
  Dept: [phone]   Congregational Nurse Program Note  Date of Encounter: 08/25/2022  Clinic visit to discuss medication issues.  Has missed blood pressure medications when he has appointments.  Reviewed medicines and counseled regarding need to take medicines later in the day rather than skip them.  He agreed to take medicines daily as prescribed. Past Medical History: Past Medical History:  Diagnosis Date   Diabetes mellitus without complication ([place])    Hypertension    Stroke [facility])     Encounter Details:  CNP Questionnaire - 08/25/22 1345       Questionnaire   Ask client: Do you give verbal consent for me to treat you today? Yes    Student Assistance N/A    Location Patient [place]    Visit Setting with Client Organization    Patient Status Unknown   Has apartment at [facility] Medicare;Medicaid    Insurance/Financial Assistance Referral N/A    Medication N/A    Medical Provider Yes    Screening Referrals Made N/A    Medical Referrals Made N/A    Medical Appointment Made N/A    Recently w/o PCP, now 1st time PCP visit completed due to CNs referral or appointment made N/A    Food N/A    Transportation Need transportation assistance    Housing/Utilities N/A    Interpersonal Safety N/A    Interventions Counsel;Advocate/Support;Reviewed Medications;Educate    Abnormal to Normal Screening Since Last CN Visit N/A    Screenings CN Performed N/A    Sent Client to Lab for: N/A    Did client attend any of the following based off CNs referral or appointments made? N/A    ED Visit Averted N/A    Life-Saving Intervention Made N/A      Questionnaire   Do you give verbal consent to treat you today? Yes    Location Patient Served  Not Applicable    Visit Setting Church or Organization    Patient Status Unknown    Insurance Medicare    Insurance Referral N/A    Medication N/A    Screening Referrals N/A    Intervention Blood  pressure;Blood glucose;Counsel;Educate

## 2022-08-25 NOTE — Congregational Nurse Program (Signed)
  Dept: [phone]   Congregational Nurse Program Note  Date of Encounter: 08/25/2022  Clinic visit to check blood pressure and weight, BP 123/79, weight 260 lbs.  Is to get electric wheelchair this week to allow him to get to appointments and errands by bus.  Past Medical History: Past Medical History:  Diagnosis Date   Diabetes mellitus without complication ([place])    Hypertension    Stroke [facility])     Encounter Details:  CNP Questionnaire - 08/18/22 1400       Questionnaire   Ask client: Do you give verbal consent for me to treat you today? Yes    Student Assistance N/A    Location Patient [place]    Visit Setting with Client Organization    Patient Status Unknown   Has apartment at [facility] Medicare;Medicaid    Insurance/Financial Assistance Referral N/A    Medication N/A    Medical Provider Yes    Screening Referrals Made N/A    Medical Referrals Made N/A    Medical Appointment Made N/A    Recently w/o PCP, now 1st time PCP visit completed due to CNs referral or appointment made N/A    Food N/A    Transportation Need transportation assistance    Housing/Utilities N/A    Interpersonal Safety N/A    Interventions Educate;Counsel;Advocate/Support;Spiritual Care;Reviewed Medications    Abnormal to Normal Screening Since Last CN Visit N/A    Screenings CN Performed Blood Pressure;Weight;Pulse Ox    Sent Client to Lab for: N/A    Did client attend any of the following based off CNs referral or appointments made? N/A    ED Visit Averted N/A    Life-Saving Intervention Made N/A      Questionnaire   Do you give verbal consent to treat you today? Yes    Location Patient Served  Not Applicable    Visit Setting Church or Organization    Patient Status Unknown    Insurance Medicare    Insurance Referral N/A    Medication N/A    Screening Referrals N/A    Intervention Blood pressure;Blood glucose;Counsel;Educate

## 2022-08-28 ENCOUNTER — Other Ambulatory Visit: Payer: Self-pay | Admitting: Critical Care Medicine

## 2022-09-02 ENCOUNTER — Other Ambulatory Visit: Payer: Self-pay

## 2022-09-08 NOTE — Congregational Nurse Program (Signed)
  Dept: [phone]   Congregational Nurse Program Note  Date of Encounter: 09/08/2022  Clinic visit to check weight and blood pressure, BP 118/78, pulse 88 and regular, O2 Sat 98%.  Weight 259 Lbs, discussed strategies to continue to lose weight and maintain weight loss. Past Medical History: Past Medical History:  Diagnosis Date   Diabetes mellitus without complication ([place])    Hypertension    Stroke [facility])     Encounter Details:  CNP Questionnaire - 09/08/22 1730       Questionnaire   Ask client: Do you give verbal consent for me to treat you today? Yes    Student Assistance N/A    Location Patient [place]    Visit Setting with Client Organization    Patient Status Unknown   Has apartment at [facility] Medicare;Medicaid    Insurance/Financial Assistance Referral N/A    Medication N/A    Medical Provider Yes    Screening Referrals Made N/A    Medical Referrals Made N/A    Medical Appointment Made N/A    Recently w/o PCP, now 1st time PCP visit completed due to CNs referral or appointment made N/A    Food N/A    Transportation Need transportation assistance    Housing/Utilities N/A    Interpersonal Safety N/A    Interventions Counsel;Advocate/Support;Reviewed Medications;Educate;Spiritual Care    Abnormal to Normal Screening Since Last CN Visit N/A    Screenings CN Performed N/A    Sent Client to Lab for: N/A    Did client attend any of the following based off CNs referral or appointments made? N/A    ED Visit Averted N/A    Life-Saving Intervention Made N/A      Questionnaire   Do you give verbal consent to treat you today? Yes    Location Patient Served  Not Applicable    Visit Setting Church or Organization    Patient Status Unknown    Insurance Medicare    Insurance Referral N/A    Medication N/A    Screening Referrals N/A    Intervention Blood pressure;Blood glucose;Counsel;Educate

## 2022-09-15 DIAGNOSIS — E1165 Type 2 diabetes mellitus with hyperglycemia: Secondary | ICD-10-CM | POA: Diagnosis not present

## 2022-09-17 ENCOUNTER — Ambulatory Visit: Payer: Medicare HMO | Attending: Critical Care Medicine

## 2022-09-17 VITALS — Ht 67.0 in | Wt 250.0 lb

## 2022-09-17 DIAGNOSIS — Z0000 Encounter for general adult medical examination without abnormal findings: Secondary | ICD-10-CM | POA: Diagnosis not present

## 2022-09-17 NOTE — Progress Notes (Signed)
I connected with  [name] on 09/17/22 by a audio enabled telemedicine application and verified that I am speaking with the correct person using two identifiers.  Patient Location: Home  Provider Location: Office/Clinic  I discussed the limitations of evaluation and management by telemedicine. The patient expressed understanding and agreed to proceed.  Subjective:   Oli Nakama is a 72 y.o. male who presents for Medicare Annual/Subsequent preventive examination.  Review of Systems     Cardiac Risk Factors include: advanced age (>58mn, >>78women);diabetes mellitus;dyslipidemia;hypertension;male gender;obesity (BMI >30kg/m2);smoking/ tobacco exposure     Objective:    Today's Vitals   09/17/22 0816  Weight: 250 lb (113.4 kg)  Height: '5\' 7"'$  (1.702 m)   Body mass index is 39.16 kg/m.     09/17/2022    8:26 AM 07/01/2022   10:27 AM 05/06/2022   10:57 AM 05/23/2021    5:53 PM 03/22/2021   11:43 AM 02/27/2021    2:54 PM  Advanced Directives  Does Patient Have a Medical Advance Directive? No No No No No No  Would patient like information on creating a medical advance directive?  No - Patient declined No - Patient declined  No - Patient declined     Current Medications (verified) Outpatient Encounter Medications as of 09/17/2022  Medication Sig   Accu-Chek Softclix Lancets lancets Use to check blood sugar 3 times daily.   amLODipine (NORVASC) 5 MG tablet TAKE 1 TABLET (5 MG TOTAL) BY MOUTH DAILY.   aspirin 81 MG EC tablet Take 1 tablet (81 mg total) by mouth daily.   atorvastatin (LIPITOR) 10 MG tablet Take 1 tablet (10 mg total) by mouth daily.   Blood Glucose Monitoring Suppl (ACCU-CHEK GUIDE) w/Device KIT Use to check blood sugar 3 times daily.   chlorthalidone (HYGROTON) 25 MG tablet TAKE 1 TABLET (25 MG TOTAL) BY MOUTH DAILY.   dapagliflozin propanediol (FARXIGA) 10 MG TABS tablet Take 1 tablet (10 mg total) by mouth daily before breakfast.   diclofenac Sodium  (VOLTAREN) 1 % GEL Apply 4 g topically 4 (four) times daily.   fenofibrate micronized (LOFIBRA) 134 MG capsule Take 1 capsule (134 mg total) by mouth daily before breakfast.   gabapentin (NEURONTIN) 100 MG capsule Take 1 capsule (100 mg total) by mouth 2 (two) times daily.   glucose blood (ACCU-CHEK GUIDE) test strip 1 each by Other route 3 (three) times daily. Use to check blood sugar 3 times daily.   insulin degludec (TRESIBA FLEXTOUCH) 200 UNIT/ML FlexTouch Pen Inject 70 Units into the skin daily in the afternoon.   Insulin Pen Needle 31G X 5 MM MISC Use in the morning, at noon, in the evening, and at bedtime.   lisinopril (ZESTRIL) 2.5 MG tablet Take 1 tablet (2.5 mg total) by mouth daily.   NOVOLOG FLEXPEN 100 UNIT/ML FlexPen Max daily 90 units   pioglitazone (ACTOS) 15 MG tablet TAKE 1 TABLET (15 MG TOTAL) BY MOUTH DAILY.   lactulose, encephalopathy, (CHRONULAC) 10 GM/15ML SOLN Take 15 mLs (10 g total) by mouth daily as needed (constipation). (Patient not taking: Reported on 09/17/2022)   Facility-Administered Encounter Medications as of 09/17/2022  Medication   glucose chewable tablet 4 g    Allergies (verified) Bee venom and Penicillins   History: Past Medical History:  Diagnosis Date   Diabetes mellitus without complication ([facility]    Hypertension    Stroke ([facility]    History reviewed. No pertinent surgical history. History reviewed. No pertinent family history. Social History  Socioeconomic History   Marital status: Single    Spouse name: Not on file   Number of children: Not on file   Years of education: Not on file   Highest education level: Not on file  Occupational History   Not on file  Tobacco Use   Smoking status: Every Day    Packs/day: 1.00    Years: 46.00    Total pack years: 46.00    Types: Cigarettes   Smokeless tobacco: Former  Scientific laboratory technician Use: Never used  Substance and Sexual Activity   Alcohol use: Not Currently   Drug use: Not Currently    Sexual activity: Not on file  Other Topics Concern   Not on file  Social History Narrative   Not on file   Social Determinants of Health   Financial Resource Strain: Low Risk  (09/17/2022)   Overall Financial Resource Strain (CARDIA)    Difficulty of Paying Living Expenses: Not hard at all  Food Insecurity: No Food Insecurity (09/17/2022)   Hunger Vital Sign    Worried About [place] in the Last Year: Never true    [place] in the Last Year: Never true  Transportation Needs: No Transportation Needs (09/17/2022)   PRAPARE - Hydrologist (Medical): No    Lack of Transportation (Non-Medical): No  Physical Activity: Inactive (09/17/2022)   Exercise Vital Sign    Days of Exercise per Week: 0 days    Minutes of Exercise per Session: 0 min  Stress: No Stress Concern Present (09/17/2022)   [place]    Feeling of Stress : Not at all  Social Connections: Not on file    Tobacco Counseling Ready to quit: Yes Counseling given: Not Answered   Clinical Intake:  Pre-visit preparation completed: Yes  Pain : No/denies pain     Nutritional Status: BMI > 30  Obese Nutritional Risks: None Diabetes: Yes  How often do you need to have someone help you when you read instructions, pamphlets, or other written materials from your doctor or pharmacy?: 1 - Never  Diabetic? Yes Nutrition Risk Assessment:  Has the patient had any N/V/D within the last 2 months?  No  Does the patient have any non-healing wounds?  No  Has the patient had any unintentional weight loss or weight gain?  No   Diabetes:  Is the patient diabetic?  Yes  If diabetic, was a CBG obtained today?  No  Did the patient bring in their glucometer from home?  No  How often do you monitor your CBG's? 8-9 daily.   Financial Strains and Diabetes Management:  Are you having any financial strains with the device,  your supplies or your medication? No .  Does the patient want to be seen by Chronic Care Management for management of their diabetes?  No  Would the patient like to be referred to a Nutritionist or for Diabetic Management?  No   Diabetic Exams:  Diabetic Eye Exam: Completed 10/14/2021 Diabetic Foot Exam: Completed 06/23/2022   Interpreter Needed?: No  Information entered by :: NAllen LPN   Activities of Daily Living    09/17/2022    8:28 AM  In your present state of health, do you have any difficulty performing the following activities:  Hearing? 0  Vision? 1  Comment blurry a little bit  Difficulty concentrating or making decisions? 0  Walking or climbing  stairs? 1  Dressing or bathing? 1  Doing errands, shopping? 0  Preparing Food and eating ? N  Using the Toilet? N  In the past six months, have you accidently leaked urine? Y  Do you have problems with loss of bowel control? N  Managing your Medications? N  Managing your Finances? N  Housekeeping or managing your Housekeeping? Y    Patient Care Team: Elsie Stain, MD as PCP - General (Pulmonary Disease)  Indicate any recent Medical Services you may have received from other than [facility] providers in the past year (date may be approximate).     Assessment:   This is a routine wellness examination for [place].  Hearing/Vision screen Vision Screening - Comments:: Regular eye exams, [facility]  Dietary issues and exercise activities discussed: Current Exercise Habits: The patient does not participate in regular exercise at present   Goals Addressed             This Visit's Progress    Patient Stated       09/17/2022, wants to get right leg and right arm stronger       Depression Screen    09/17/2022    8:28 AM 06/23/2022    9:35 AM 04/02/2022    8:48 AM 09/30/2021   10:13 AM 08/06/2021    9:13 AM 06/16/2021    9:58 AM 10/15/2020    9:19 AM  PHQ 2/9 Scores  PHQ - 2 Score 0 0 0 0 1 0 0  PHQ- 9 Score   0 0 8   2    Fall Risk    09/17/2022    8:28 AM 06/23/2022    9:35 AM 04/02/2022    8:48 AM 03/02/2022    2:33 PM 12/31/2021    8:56 AM  Fall Risk   Falls in the past year? 0 0 0 0 0  Number falls in past yr: 0 0 0 0 0  Injury with Fall? 0 0 0 0 0  Risk for fall due to : Medication side effect;Impaired mobility;Impaired balance/gait No Fall Risks  No Fall Risks No Fall Risks  Follow up Falls prevention discussed;Education provided;Falls evaluation completed   Falls evaluation completed     FALL RISK PREVENTION PERTAINING TO THE HOME:  Any stairs in or around the home? No  If so, are there any without handrails? N/a Home free of loose throw rugs in walkways, pet beds, electrical cords, etc? Yes  Adequate lighting in your home to reduce risk of falls? Yes   ASSISTIVE DEVICES UTILIZED TO PREVENT FALLS:  Life alert? No  Use of a cane, walker or w/c? Yes  Grab bars in the bathroom? Yes  Shower chair or bench in shower? No  Elevated toilet seat or a handicapped toilet? No   TIMED UP AND GO:  Was the test performed? No .      Cognitive Function:        09/17/2022    8:31 AM 03/22/2021   11:49 AM  6CIT Screen  What Year? 0 points 0 points  What month? 0 points 0 points  What time? 0 points 0 points  Count back from 20 0 points 0 points  Months in reverse 0 points 0 points  Repeat phrase 0 points   Total Score 0 points     Immunizations Immunization History  Administered Date(s) Administered   Fluad Quad(high Dose 65+) 04/02/2022   Influenza,inj,Quad PF,6+ Mos 04/15/2020, 03/19/2021   PFIZER Comirnaty(Gray Top)Covid-19 Tri-Sucrose Vaccine  09/11/2020, 01/22/2021   PFIZER(Purple Top)SARS-COV-2 Vaccination 11/09/2019, 12/07/2019   Pneumococcal Conjugate-13 04/15/2020   Pneumococcal Polysaccharide-23 06/16/2021   Respiratory Syncytial Virus Vaccine,Recomb Aduvanted(Arexvy) 04/02/2022   Tdap 04/15/2017   Zoster Recombinat (Shingrix) 04/15/2022    TDAP status: Up to  date  Flu Vaccine status: Up to date  Pneumococcal vaccine status: Up to date  Covid-19 vaccine status: Completed vaccines  Qualifies for Shingles Vaccine? Yes   Zostavax completed No   Shingrix Completed?: needs second dose  Screening Tests Health Maintenance  Topic Date Due   COVID-19 Vaccine (5 - 2023-24 season) 03/20/2022   Medicare Annual Wellness (AWV)  03/22/2022   Zoster Vaccines- Shingrix (2 of 2) 06/10/2022   HEMOGLOBIN A1C  10/01/2022   OPHTHALMOLOGY EXAM  10/15/2022   Diabetic kidney evaluation - Urine ACR  01/01/2023   Diabetic kidney evaluation - eGFR measurement  05/24/2023   Lung Cancer Screening  05/30/2023   FOOT EXAM  06/24/2023   DTaP/Tdap/Td (2 - Td or Tdap) 04/16/2027   COLONOSCOPY (Pts 45-21yr Insurance coverage will need to be confirmed)  07/24/2029   Pneumonia Vaccine 70 Years old  Completed   INFLUENZA VACCINE  Completed   Hepatitis C Screening  Completed   HPV VACCINES  Aged Out    Health Maintenance  Health Maintenance Due  Topic Date Due   COVID-19 Vaccine (5 - 2023-24 season) 03/20/2022   Medicare Annual Wellness (AWV)  03/22/2022   Zoster Vaccines- Shingrix (2 of 2) 06/10/2022    Colorectal cancer screening: Type of screening: Colonoscopy. Completed 07/25/2019. Repeat every 10 years  Lung Cancer Screening: (Low Dose CT Chest recommended if Age 70-80years, 30 pack-year currently smoking OR have quit w/in 15years.) does qualify.   Lung Cancer Screening Referral:  CT scan 05/29/2022  Additional Screening:  Hepatitis C Screening: does qualify; Completed 04/15/2020  Vision Screening: Recommended annual ophthalmology exams for early detection of glaucoma and other disorders of the eye. Is the patient up to date with their annual eye exam?  Yes  Who is the provider or what is the name of the office in which the patient attends annual eye exams? Can't remember name If pt is not established with a provider, would they like to be referred to  a provider to establish care? No .   Dental Screening: Recommended annual dental exams for proper oral hygiene  Community Resource Referral / Chronic Care Management: CRR required this visit?  No   CCM required this visit?  No      Plan:     I have personally reviewed and noted the following in the patient's chart:   Medical and social history Use of alcohol, tobacco or illicit drugs  Current medications and supplements including opioid prescriptions. Patient is not currently taking opioid prescriptions. Functional ability and status Nutritional status Physical activity Advanced directives List of other physicians Hospitalizations, surgeries, and ER visits in previous 12 months Vitals Screenings to include cognitive, depression, and falls Referrals and appointments  In addition, I have reviewed and discussed with patient certain preventive protocols, quality metrics, and best practice recommendations. A written personalized care plan for preventive services as well as general preventive health recommendations were provided to patient.     NKellie Simmering LPN   [id number]  Nurse Notes: none  Due to this being a virtual visit, the after visit summary with patients personalized plan was offered to patient via mail or my-chart.  to pick up at office at next visit

## 2022-09-17 NOTE — Patient Instructions (Signed)
[name] , Thank you for taking time to come for your Medicare Wellness Visit. I appreciate your ongoing commitment to your health goals. Please review the following plan we discussed and let me know if I can assist you in the future.   These are the goals we discussed:  Goals      Patient Stated     09/17/2022, wants to get right leg and right arm stronger        This is a list of the screening recommended for you and due dates:  Health Maintenance  Topic Date Due   COVID-19 Vaccine (5 - 2023-24 season) 03/20/2022   Zoster (Shingles) Vaccine (2 of 2) 06/10/2022   Hemoglobin A1C  10/01/2022   Eye exam for diabetics  10/15/2022   Yearly kidney health urinalysis for diabetes  01/01/2023   Yearly kidney function blood test for diabetes  05/24/2023   Screening for Lung Cancer  05/30/2023   Complete foot exam   06/24/2023   Medicare Annual Wellness Visit  09/17/2023   DTaP/Tdap/Td vaccine (2 - Td or Tdap) 04/16/2027   Colon Cancer Screening  07/24/2029   Pneumonia Vaccine  Completed   Flu Shot  Completed   Hepatitis C Screening: USPSTF Recommendation to screen - Ages 41-79 yo.  Completed   HPV Vaccine  Aged Out    Advanced directives: Advance directive discussed with you today.   Conditions/risks identified: smoking  Next appointment: Follow up in one year for your annual wellness visit.   Preventive Care 31 Years and Older, Male  Preventive care refers to lifestyle choices and visits with your health care provider that can promote health and wellness. What does preventive care include? A yearly physical exam. This is also called an annual well check. Dental exams once or twice a year. Routine eye exams. Ask your health care provider how often you should have your eyes checked. Personal lifestyle choices, including: Daily care of your teeth and gums. Regular physical activity. Eating a healthy diet. Avoiding tobacco and drug use. Limiting alcohol use. Practicing safe  sex. Taking low doses of aspirin every day. Taking vitamin and mineral supplements as recommended by your health care provider. What happens during an annual well check? The services and screenings done by your health care provider during your annual well check will depend on your age, overall health, lifestyle risk factors, and family history of disease. Counseling  Your health care provider may ask you questions about your: Alcohol use. Tobacco use. Drug use. Emotional well-being. Home and relationship well-being. Sexual activity. Eating habits. History of falls. Memory and ability to understand (cognition). Work and work Statistician. Screening  You may have the following tests or measurements: Height, weight, and BMI. Blood pressure. Lipid and cholesterol levels. These may be checked every 5 years, or more frequently if you are over 21 years old. Skin check. Lung cancer screening. You may have this screening every year starting at age 24 if you have a 30-pack-year history of smoking and currently smoke or have quit within the past 15 years. Fecal occult blood test (FOBT) of the stool. You may have this test every year starting at age 47. Flexible sigmoidoscopy or colonoscopy. You may have a sigmoidoscopy every 5 years or a colonoscopy every 10 years starting at age 87. Prostate cancer screening. Recommendations will vary depending on your family history and other risks. Hepatitis C blood test. Hepatitis B blood test. Sexually transmitted disease (STD) testing. Diabetes screening. This is done by  checking your blood sugar (glucose) after you have not eaten for a while (fasting). You may have this done every 1-3 years. Abdominal aortic aneurysm (AAA) screening. You may need this if you are a current or former smoker. Osteoporosis. You may be screened starting at age 34 if you are at high risk. Talk with your health care provider about your test results, treatment options, and if  necessary, the need for more tests. Vaccines  Your health care provider may recommend certain vaccines, such as: Influenza vaccine. This is recommended every year. Tetanus, diphtheria, and acellular pertussis (Tdap, Td) vaccine. You may need a Td booster every 10 years. Zoster vaccine. You may need this after age 61. Pneumococcal 13-valent conjugate (PCV13) vaccine. One dose is recommended after age 77. Pneumococcal polysaccharide (PPSV23) vaccine. One dose is recommended after age 19. Talk to your health care provider about which screenings and vaccines you need and how often you need them. This information is not intended to replace advice given to you by your health care provider. Make sure you discuss any questions you have with your health care provider. Document Released: 08/02/2015 Document Revised: 03/25/2016 Document Reviewed: 05/07/2015 Elsevier Interactive Patient Education  2017 [employer] Prevention in the Home Falls can cause injuries. They can happen to people of all ages. There are many things you can do to make your home safe and to help prevent falls. What can I do on the outside of my home? Regularly fix the edges of walkways and driveways and fix any cracks. Remove anything that might make you trip as you walk through a door, such as a raised step or threshold. Trim any bushes or trees on the path to your home. Use bright outdoor lighting. Clear any walking paths of anything that might make someone trip, such as rocks or tools. Regularly check to see if handrails are loose or broken. Make sure that both sides of any steps have handrails. Any raised decks and porches should have guardrails on the edges. Have any leaves, snow, or ice cleared regularly. Use sand or salt on walking paths during winter. Clean up any spills in your garage right away. This includes oil or grease spills. What can I do in the bathroom? Use night lights. Install grab bars by the toilet  and in the tub and shower. Do not use towel bars as grab bars. Use non-skid mats or decals in the tub or shower. If you need to sit down in the shower, use a plastic, non-slip stool. Keep the floor dry. Clean up any water that spills on the floor as soon as it happens. Remove soap buildup in the tub or shower regularly. Attach bath mats securely with double-sided non-slip rug tape. Do not have throw rugs and other things on the floor that can make you trip. What can I do in the bedroom? Use night lights. Make sure that you have a light by your bed that is easy to reach. Do not use any sheets or blankets that are too big for your bed. They should not hang down onto the floor. Have a firm chair that has side arms. You can use this for support while you get dressed. Do not have throw rugs and other things on the floor that can make you trip. What can I do in the kitchen? Clean up any spills right away. Avoid walking on wet floors. Keep items that you use a lot in easy-to-reach places. If you need to reach  something above you, use a strong step stool that has a grab bar. Keep electrical cords out of the way. Do not use floor polish or wax that makes floors slippery. If you must use wax, use non-skid floor wax. Do not have throw rugs and other things on the floor that can make you trip. What can I do with my stairs? Do not leave any items on the stairs. Make sure that there are handrails on both sides of the stairs and use them. Fix handrails that are broken or loose. Make sure that handrails are as long as the stairways. Check any carpeting to make sure that it is firmly attached to the stairs. Fix any carpet that is loose or worn. Avoid having throw rugs at the top or bottom of the stairs. If you do have throw rugs, attach them to the floor with carpet tape. Make sure that you have a light switch at the top of the stairs and the bottom of the stairs. If you do not have them, ask someone to add  them for you. What else can I do to help prevent falls? Wear shoes that: Do not have high heels. Have rubber bottoms. Are comfortable and fit you well. Are closed at the toe. Do not wear sandals. If you use a stepladder: Make sure that it is fully opened. Do not climb a closed stepladder. Make sure that both sides of the stepladder are locked into place. Ask someone to hold it for you, if possible. Clearly mark and make sure that you can see: Any grab bars or handrails. First and last steps. Where the edge of each step is. Use tools that help you move around (mobility aids) if they are needed. These include: Canes. Walkers. Scooters. Crutches. Turn on the lights when you go into a dark area. Replace any light bulbs as soon as they burn out. Set up your furniture so you have a clear path. Avoid moving your furniture around. If any of your floors are uneven, fix them. If there are any pets around you, be aware of where they are. Review your medicines with your doctor. Some medicines can make you feel dizzy. This can increase your chance of falling. Ask your doctor what other things that you can do to help prevent falls. This information is not intended to replace advice given to you by your health care provider. Make sure you discuss any questions you have with your health care provider. Document Released: 05/02/2009 Document Revised: 12/12/2015 Document Reviewed: 08/10/2014 Elsevier Interactive Patient Education  2017 [employer].

## 2022-09-22 NOTE — Congregational Nurse Program (Signed)
  Dept: [phone]   Congregational Nurse Program Note  Date of Encounter: 09/22/2022  Visit today to discuss blood glucose levels and weight loss plan.  BP 115/79, pulse 96 and regular, O2 Sat 96%.  Using [facility] continuous glucose monitor, current blood glucose 109 ac dinner, reports ac breakfast 120 and bedtime yesterday about 180.  Has maintained 5 lb weight loss, goal to lose about a pound per week.  Reviewed meals and he is eating more vegetables and few sweets but does drink about half bottle of regular soft drink each day.  Has cut down on cigarettes, is going to try the nicotine gum this week. Past Medical History: Past Medical History:  Diagnosis Date   Diabetes mellitus without complication ([place])    Hypertension    Stroke [facility])     Encounter Details:  CNP Questionnaire - 09/22/22 1511       Questionnaire   Ask client: Do you give verbal consent for me to treat you today? Yes    Student Assistance N/A    Location Patient [facility]inal[facility]    Visit Setting with Client Organization    Patient Status Unknown   Has apartment at [facility] Medicare;Medicaid    Insurance/Financial Assistance Referral N/A    Medication N/A    Medical Provider Yes    Screening Referrals Made N/A    Medical Referrals Made N/A    Medical Appointment Made N/A    Recently w/o PCP, now 1st time PCP visit completed due to CNs referral or appointment made N/A    Food N/A    Transportation Need transportation assistance    Housing/Utilities N/A    Interpersonal Safety N/A    Interventions Counsel;Advocate/Support;Reviewed Medications;Educate;Spiritual Care    Abnormal to Normal Screening Since Last CN Visit N/A    Screenings CN Performed Blood Pressure;Pulse Ox;Weight    Sent Client to Lab for: N/A    Did client attend any of the following based off CNs referral or appointments made? N/A    ED Visit Averted N/A    Life-Saving Intervention Made N/A       Questionnaire   Do you give verbal consent to treat you today? Yes    Location Patient Served  Not Applicable    Visit Setting Church or Organization    Patient Status Unknown    Insurance Medicare    Insurance Referral N/A    Medication N/A    Screening Referrals N/A    Intervention Blood pressure;Blood glucose;Counsel;Educate

## 2022-09-29 NOTE — Congregational Nurse Program (Signed)
  Dept: [phone]   Congregational Nurse Program Note  Date of Encounter: 09/29/2022  Clinic visit to check blood pressure and weight, BP 117/75, pulse 91, O2 sat 98%.  Weight is 259 Lbs, reviewed meals for past week and discussed type of carbohydrates to eat in moderation.  Reviewed blood glucose levels from [facility] continous glucose monitoring. Clinic visit to check blood pressure and for complaint of tingling and numbness in feet and ankles.  States he's had increased tingling at intervals worse left foot, no numbness today, pulses normal, no redness or swelling noted. Both feet dry and scaly with calluses on heels, toenails long and thick.  Has not ben washing feet daily and home health aide comes only 3 days per week.  Pinon with long handled sponge extension that he can purchase to wash own feet daily.  Recommended that he schedule podiatry visit to have trimming of toenails.  Educated on foot care to prevent complications from diabetes.  Blood glucose 107 this AM and 147 2 hrs PC lunch per [employer] CGM.  PCP notified of tingling and numbness in feet. Past Medical History: Past Medical History:  Diagnosis Date   Diabetes mellitus without complication ([place])    Hypertension    Stroke [facility])     Encounter Details:  CNP Questionnaire - 09/29/22 1610       Questionnaire   Ask client: Do you give verbal consent for me to treat you today? Yes    Student Assistance N/A    Location Patient [place]    Visit Setting with Client Organization    Patient Status Unknown   Has apartment at [facility] Medicare;Medicaid    Insurance/Financial Assistance Referral N/A    Medication N/A    Medical Provider Yes    Screening Referrals Made N/A    Medical Referrals Made N/A    Medical Appointment Made N/A    Recently w/o PCP, now 1st time PCP visit completed due to CNs referral or appointment made N/A    Food N/A     Transportation Need transportation assistance    Housing/Utilities N/A    Interpersonal Safety N/A    Interventions Counsel;Advocate/Support;Educate;Spiritual Care;Case Management    Abnormal to Normal Screening Since Last CN Visit N/A    Screenings CN Performed Blood Pressure;Pulse Ox;Weight    Sent Client to Lab for: N/A    Did client attend any of the following based off CNs referral or appointments made? N/A    ED Visit Averted N/A    Life-Saving Intervention Made N/A      Questionnaire   Do you give verbal consent to treat you today? Yes    Location Patient Served  Not Applicable    Visit Setting Church or Organization    Patient Status Unknown    Insurance Medicare    Insurance Referral N/A    Medication N/A    Screening Referrals N/A    Intervention Blood pressure;Blood glucose;Counsel;Educate

## 2022-10-07 ENCOUNTER — Ambulatory Visit (INDEPENDENT_AMBULATORY_CARE_PROVIDER_SITE_OTHER): Payer: Medicare HMO | Admitting: Podiatry

## 2022-10-07 DIAGNOSIS — L853 Xerosis cutis: Secondary | ICD-10-CM

## 2022-10-07 DIAGNOSIS — M79674 Pain in right toe(s): Secondary | ICD-10-CM | POA: Diagnosis not present

## 2022-10-07 DIAGNOSIS — M79675 Pain in left toe(s): Secondary | ICD-10-CM

## 2022-10-07 DIAGNOSIS — B351 Tinea unguium: Secondary | ICD-10-CM

## 2022-10-07 MED ORDER — AMMONIUM LACTATE 12 % EX CREA: 1.0000 | TOPICAL_CREAM | CUTANEOUS | 5 refills | Status: AC | PRN

## 2022-10-07 NOTE — Congregational Nurse Program (Signed)
  Dept: [phone]   Congregational Nurse Program Note  Date of Encounter: 10/06/2022  Clinic visit to arrange for transportation to podiatry appointment and to check weight and blood pressure.  BP 122/93, weight 258 Lbs.  Discussed meal preparation to limit salt and fat in vegetables.  Arranged for transportation to podiatry appointment as the [facility] transport not available unless arranged three days prior to the appointment. Past Medical History: Past Medical History:  Diagnosis Date   Diabetes mellitus without complication ([place])    Hypertension    Stroke [facility])     Encounter Details:  CNP Questionnaire - 10/06/22 1500       Questionnaire   Ask client: Do you give verbal consent for me to treat you today? Yes    Student Assistance N/A    Location Patient [place]    Visit Setting with Client Organization    Patient Status Unknown   Has apartment at [facility] Medicare;Medicaid    Insurance/Financial Assistance Referral N/A    Medication N/A    Medical Provider Yes    Screening Referrals Made N/A    Medical Referrals Made N/A    Medical Appointment Made N/A    Recently w/o PCP, now 1st time PCP visit completed due to CNs referral or appointment made N/A    Food N/A    Transportation Need transportation assistance    Housing/Utilities N/A    Interpersonal Safety N/A    Interventions Counsel;Advocate/Support;Educate;Case Management    Abnormal to Normal Screening Since Last CN Visit N/A    Screenings CN Performed Blood Pressure;Pulse Ox;Weight    Sent Client to Lab for: N/A    Did client attend any of the following based off CNs referral or appointments made? N/A    ED Visit Averted N/A    Life-Saving Intervention Made N/A      Questionnaire   Do you give verbal consent to treat you today? Yes    Location Patient Served  Not Applicable    Visit Setting Church or Organization    Patient Status Unknown    Insurance Medicare     Insurance Referral N/A    Medication N/A    Screening Referrals N/A    Intervention Blood pressure;Blood glucose;Counsel;Educate

## 2022-10-11 NOTE — Progress Notes (Signed)
  Subjective:  Patient ID: Reginald Rodriguez, male    DOB: June 14, 1951,  MRN: [id number]  Chief Complaint  Patient presents with   Nail Problem    Thick painful toenails, overdue 3 month follow up    72 y.o. male presents with the above complaint. History confirmed with patient. Nails are thickened elongated and painful in shoe gear. Prior debridements helpful in controlling this. He also notes he has had very dry flaking skin despite use of moisturizer  Objective:  Physical Exam: warm, good capillary refill, no trophic changes or ulcerative lesions, normal DP and PT pulses, normal sensory exam, and diffuse global xerosis cutis. Left Foot: dystrophic yellowed discolored nail plates with subungual debris Right Foot: dystrophic yellowed discolored nail plates with subungual debris  No images are attached to the encounter.   Assessment:   1. Pain due to onychomycosis of toenails of both feet   2. Xerosis cutis      Plan:  Patient was evaluated and treated and all questions answered.  Discussed the etiology and treatment options for the condition in detail with the patient. Educated patient on the topical and oral treatment options for mycotic nails. Recommended debridement of the nails today. Sharp and mechanical debridement performed of all painful and mycotic nails today. Nails debrided in length and thickness using a nail nipper to level of comfort. Discussed treatment options including appropriate shoe gear. Follow up as needed for painful nails.  Discussed treatment of dry skin including hygiene and regular use of moisturizer. Recommended ammonium lactate cream to promote exfoliation. Rx sent to pharmacy for this, he has a nurse that comes to house that will be able to apply.  Return if symptoms worsen or fail to improve.

## 2022-10-13 NOTE — Congregational Nurse Program (Signed)
  Dept: [phone]   Congregational Nurse Program Note  Date of Encounter: 10/13/2022  Clinic visit to follow-up from podiatry appointment.  States that he has less burning and pain right foot after toenails trimmed. Toenails debrided and calluses removed. Educated regarding importance of daily foot care to prevent complications from diabetes.  BP 18/85, pulse 87, O2 Sat 99%.  Reports that am blood glucose was 126 and pc lunch also 126 per [facility] continuous glucose monitor.  Encouraged to continue to linit carbohydrates and eat vegetables. Past Medical History: Past Medical History:  Diagnosis Date   Diabetes mellitus without complication ([place])    Hypertension    Stroke [facility])     Encounter Details:  CNP Questionnaire - 10/13/22 1538       Questionnaire   Ask client: Do you give verbal consent for me to treat you today? Yes    Student Assistance N/A    Location Patient [place]    Visit Setting with Client Organization    Patient Status Unknown   Has apartment at [facility] Medicare;Medicaid    Insurance/Financial Assistance Referral N/A    Medication N/A    Medical Provider Yes    Screening Referrals Made N/A    Medical Referrals Made N/A    Medical Appointment Made N/A    Recently w/o PCP, now 1st time PCP visit completed due to CNs referral or appointment made N/A    Food N/A    Transportation Need transportation assistance    Housing/Utilities N/A    Interpersonal Safety N/A    Interventions Counsel;Advocate/Support;Educate;Case Management;Spiritual Care    Abnormal to Normal Screening Since Last CN Visit N/A    Screenings CN Performed Blood Pressure;Pulse Ox;Weight    Sent Client to Lab for: N/A    Did client attend any of the following based off CNs referral or appointments made? Medical;Transportation    ED Visit Averted N/A    Life-Saving Intervention Made N/A      Questionnaire   Do you give verbal consent  to treat you today? Yes    Location Patient Served  Not Applicable    Visit Setting Church or Organization    Patient Status Unknown    Insurance Medicare    Insurance Referral N/A    Medication N/A    Screening Referrals N/A    Intervention Blood pressure;Blood glucose;Counsel;Educate

## 2022-10-20 NOTE — Congregational Nurse Program (Signed)
  Dept: [phone]   Congregational Nurse Program Note  Date of Encounter: 10/20/2022  Clinic visit to review blood glucose results and follow-up from podiatry visit.  Blood glucose before breakfast this AM 113, PC lunch 176, uses [employer] continuous glucose monitor, per report no glucose results over 200 in past week.  Assisted with foot care supplies, educated on daily foot care to prevent complications from diabetes. States less tingling in feet since went to podiatrist. Past Medical History: Past Medical History:  Diagnosis Date   Diabetes mellitus without complication ([place])    Hypertension    Stroke [facility])     Encounter Details:  CNP Questionnaire - 10/20/22 1545       Questionnaire   Ask client: Do you give verbal consent for me to treat you today? Yes    Student Assistance N/A    Location Patient [place]    Visit Setting with Client Organization    Patient Status Unknown   Has apartment at [facility] Medicare;Medicaid    Insurance/Financial Assistance Referral N/A    Medication N/A    Medical Provider Yes    Screening Referrals Made N/A    Medical Referrals Made N/A    Medical Appointment Made N/A    Recently w/o PCP, now 1st time PCP visit completed due to CNs referral or appointment made N/A    Food N/A    Transportation Need transportation assistance    Housing/Utilities N/A    Interpersonal Safety N/A    Interventions Counsel;Advocate/Support;Educate;Case Management;Spiritual Care    Abnormal to Normal Screening Since Last CN Visit N/A    Screenings CN Performed N/A    Sent Client to Lab for: N/A    Did client attend any of the following based off CNs referral or appointments made? N/A    ED Visit Averted N/A    Life-Saving Intervention Made N/A      Questionnaire   Do you give verbal consent to treat you today? Yes    Location Patient Served  Not Applicable    Visit Setting Church or Organization     Patient Status Unknown    Insurance Medicare    Insurance Referral N/A    Medication N/A    Screening Referrals N/A    Intervention Blood pressure;Blood glucose;Counsel;Educate

## 2022-10-21 ENCOUNTER — Ambulatory Visit (INDEPENDENT_AMBULATORY_CARE_PROVIDER_SITE_OTHER): Payer: Medicare HMO | Admitting: Internal Medicine

## 2022-10-21 ENCOUNTER — Encounter: Payer: Self-pay | Admitting: Internal Medicine

## 2022-10-21 VITALS — BP 128/82 | HR 102 | Ht 67.0 in | Wt 258.0 lb

## 2022-10-21 DIAGNOSIS — E1122 Type 2 diabetes mellitus with diabetic chronic kidney disease: Secondary | ICD-10-CM

## 2022-10-21 DIAGNOSIS — N1832 Chronic kidney disease, stage 3b: Secondary | ICD-10-CM | POA: Diagnosis not present

## 2022-10-21 DIAGNOSIS — R6889 Other general symptoms and signs: Secondary | ICD-10-CM | POA: Diagnosis not present

## 2022-10-21 DIAGNOSIS — E1142 Type 2 diabetes mellitus with diabetic polyneuropathy: Secondary | ICD-10-CM

## 2022-10-21 DIAGNOSIS — Z794 Long term (current) use of insulin: Secondary | ICD-10-CM | POA: Diagnosis not present

## 2022-10-21 DIAGNOSIS — E1165 Type 2 diabetes mellitus with hyperglycemia: Secondary | ICD-10-CM

## 2022-10-21 LAB — POCT GLYCOSYLATED HEMOGLOBIN (HGB A1C): Hemoglobin A1C: 7.9 % — AB (ref 4.0–5.6)

## 2022-10-21 MED ORDER — INSULIN PEN NEEDLE 31G X 5 MM MISC: 1.0000 | Freq: Four times a day (QID) | 3 refills | Status: DC

## 2022-10-21 MED ORDER — NOVOLOG FLEXPEN 100 UNIT/ML ~~LOC~~ SOPN: PEN_INJECTOR | SUBCUTANEOUS | 3 refills | Status: DC

## 2022-10-21 MED ORDER — DAPAGLIFLOZIN PROPANEDIOL 10 MG PO TABS: 10.0000 mg | ORAL_TABLET | Freq: Every day | ORAL | 3 refills | Status: DC

## 2022-10-21 MED ORDER — PIOGLITAZONE HCL 30 MG PO TABS: 30.0000 mg | ORAL_TABLET | Freq: Every day | ORAL | 3 refills | Status: DC

## 2022-10-21 MED ORDER — TRESIBA FLEXTOUCH 200 UNIT/ML ~~LOC~~ SOPN: 72.0000 [IU] | PEN_INJECTOR | Freq: Every day | SUBCUTANEOUS | 3 refills | Status: DC

## 2022-10-21 NOTE — Progress Notes (Signed)
Name: Reginald Rodriguez  MRN/ DOB: [id number], Mar 22, 1951   Age/ Sex: 72 y.o., male    PCP: Elsie Stain, MD   Reason for Endocrinology Evaluation: Type 2 Diabetes Mellitus     Date of Initial Endocrinology Visit: 03/12/2021    PATIENT IDENTIFIER: Reginald Rodriguez is a 72 y.o. male with a past medical history of DM, HTN , COPD , Hx of cocaine abuse and Dyslipidemia. The patient presented for initial endocrinology clinic visit on 03/12/2021 for consultative assistance with his diabetes management.     DIABETIC HISTORY:  [name] was diagnosed with DM many years ago. His hemoglobin A1c has ranged from 7.7%, peaking at 9.7% in 2021  He is intolerant to higher doses of Trulicity   On his initial visit to our clinic his A1c was 9.2 % we started farxiga, continued Trulicity and Basal/ Prandial regimen   Trulicity was stopped 0000000 as he could only tolerate the smallest dose and we opted to start pioglitazone  SUBJECTIVE:   During the last visit (04/17/2022): A1c 7.6 %      Today (10/21/22): [name] is here for a follow up on diabetes management.   He checks his sugars blood sugars multiple times daily through CGM. The patient has had hypoglycemic episodes since the last clinic visit.He is symptomatic and they are rare occurring    He was seen by podiatry 10/11/2022  Denies nausea or diarrhea    HOME DIABETES REGIMEN: Tresiba 72 units daily at night  Novolog 32 units with breakfast, 32 units with lunch, and 24 units with supper Pioglitazone 15 mg daily  Farxiga 10 mg daily      Statin: yes ACE-I/ARB: yes   CONTINUOUS GLUCOSE MONITORING RECORD INTERPRETATION    Dates of Recording: 3/21-10/21/2022  Sensor description:freestyle libre  Results statistics:   CGM use % of time 88  Average and SD 166/21.9  Time in range     67   %  % Time Above 180 32  % Time above 250 1  % Time Below target 0   Glycemic patterns summary: Bg's optimal at night but trend up  occasionally during the day   Hyperglycemic episodes  postprandial   Hypoglycemic episodes occurred  rare post bolus   Overnight periods: optimal      DIABETIC COMPLICATIONS: Microvascular complications:  CKD III, neuropathy  Denies: retinopathy  Last eye exam: Completed 10/14/2021  Macrovascular complications:  CVA ( Right hemiparesis )  Denies: CAD, PVD   PAST HISTORY: Past Medical History:  Past Medical History:  Diagnosis Date   Diabetes mellitus without complication    Hypertension    Stroke    Past Surgical History: No past surgical history on file.  Social History:  reports that he has been smoking cigarettes. He has a 46.00 pack-year smoking history. He has quit using smokeless tobacco. He reports that he does not currently use alcohol. He reports that he does not currently use drugs. Family History: No family history on file.   HOME MEDICATIONS: Allergies as of 10/21/2022       Reactions   Bee Venom Swelling   Penicillins Rash        Medication List        Accurate as of October 21, 2022  9:37 AM. If you have any questions, ask your nurse or doctor.          Accu-Chek Guide test strip Generic drug: glucose blood 1 each by Other route 3 (three) times daily.  Use to check blood sugar 3 times daily.   Accu-Chek Guide w/Device Kit Use to check blood sugar 3 times daily.   Accu-Chek Softclix Lancets lancets Use to check blood sugar 3 times daily.   amLODipine 5 MG tablet Commonly known as: NORVASC TAKE 1 TABLET (5 MG TOTAL) BY MOUTH DAILY.   ammonium lactate 12 % cream Commonly known as: AMLACTIN Apply 1 Application topically as needed for dry skin.   aspirin EC 81 MG tablet Take 1 tablet (81 mg total) by mouth daily.   atorvastatin 10 MG tablet Commonly known as: LIPITOR Take 1 tablet (10 mg total) by mouth daily.   chlorthalidone 25 MG tablet Commonly known as: HYGROTON TAKE 1 TABLET (25 MG TOTAL) BY MOUTH DAILY.   dapagliflozin  propanediol 10 MG Tabs tablet Commonly known as: Farxiga Take 1 tablet (10 mg total) by mouth daily before breakfast.   diclofenac Sodium 1 % Gel Commonly known as: VOLTAREN Apply 4 g topically 4 (four) times daily.   fenofibrate micronized 134 MG capsule Commonly known as: LOFIBRA Take 1 capsule (134 mg total) by mouth daily before breakfast.   gabapentin 100 MG capsule Commonly known as: NEURONTIN Take 1 capsule (100 mg total) by mouth 2 (two) times daily.   Insulin Pen Needle 31G X 5 MM Misc Use in the morning, at noon, in the evening, and at bedtime.   lactulose (encephalopathy) 10 GM/15ML Soln Commonly known as: CHRONULAC Take 15 mLs (10 g total) by mouth daily as needed (constipation).   lisinopril 2.5 MG tablet Commonly known as: ZESTRIL Take 1 tablet (2.5 mg total) by mouth daily.   NovoLOG FlexPen 100 UNIT/ML FlexPen Generic drug: insulin aspart Max daily 90 units   pioglitazone 15 MG tablet Commonly known as: ACTOS TAKE 1 TABLET (15 MG TOTAL) BY MOUTH DAILY.   [name] FlexTouch 200 UNIT/ML FlexTouch Pen Generic drug: insulin degludec Inject 70 Units into the skin daily in the afternoon.         ALLERGIES: Allergies  Allergen Reactions   Bee Venom Swelling   Penicillins Rash     OBJECTIVE:   VITAL SIGNS: BP 128/82   Pulse (!) 102   Ht 5\' 7"  (1.702 m)   Wt 258 lb (117 kg)   SpO2 96%   BMI 40.41 kg/m    PHYSICAL EXAM:  General: Pt appears well and is in NAD with a walker  Lungs: Clear with good BS bilat   Heart: RRR  Extremities:  Lower extremities - No pretibial edema.  Neuro: MS is good with appropriate affect, pt is alert and Ox3    DM Foot Exam 10/07/2022 per podiatry    DATA REVIEWED:  Lab Results  Component Value Date   HGBA1C 7.6 (A) 04/02/2022   HGBA1C 8.1 (A) 10/15/2021   HGBA1C 8.4 (A) 06/16/2021   Lab Results  Component Value Date   LDLCALC 50 12/17/2020   CREATININE 1.66 (H) 05/23/2022   ASSESSMENT / PLAN /  RECOMMENDATIONS:   1) Type 2 Diabetes Mellitus, Sub-Optimally controlled , With Neuropathic, CKD III and macrovascular  complications - Most recent A1c of 7.9 %. Goal A1c < 7.0 %.    - A1c slightly increase  - He developed severe nausea to a small dose of trulicity in the past but today he declines trying any other GLP-1 agonists  - Will increase pioglitazone  - Pt was noted with a regular pepsi bottle with him today, counseled about avoiding sugar- sweetened beverages , discussed  rule 15 for hypoglycemia.    MEDICATIONS:   Increase  pioglitazone 30 mg daily Continue farxiga  10 tablet every morning  Continue Tresiba 72 units once daily  Continue Novolog 32 units with Breakfast, 32 units with Lunch and 24 units with Supper    EDUCATION / INSTRUCTIONS: BG monitoring instructions: Patient is instructed to check his blood sugars 3 times a day, before meals. Call [facility] if: BG persistently < 70  I reviewed the Rule of 15 for the treatment of hypoglycemia in detail with the patient. Literature supplied.   2) Diabetic complications:  Eye: Does not have known diabetic retinopathy.  Neuro/ Feet: Does  have known diabetic peripheral neuropathy. Renal: Patient does  have known baseline CKD. He is  on an ACEI/ARB at present.     F/U in 6 months     Signed electronically by: Mack Guise, MD  [facility] Endocr[facility]ology  [facility] [address] [place], [place] [address] Phone: [phone] FAX: [phone]   CC: Elsie Stain, MD [address] [place] [address] Phone: [phone]  Fax: [phone]    Return to Endocrinology clinic as below: Future Appointments  Date Time Provider [place]  10/22/2022  9:50 AM Elsie Stain, MD [id number] None

## 2022-10-21 NOTE — Patient Instructions (Addendum)
-   Increase Pioglitazone (Actos) 30 mg daily  - Continue farxiga 10 tablet every morning  - Continue Tresiba 72 units once daily  - Continue   Novolog 32 units with Breakfast, 32 units with Lunch and 24 units with Supper      HOW TO TREAT LOW BLOOD SUGARS (Blood sugar LESS THAN 70 MG/DL) Please follow the RULE OF 15 for the treatment of hypoglycemia treatment (when your (blood sugars are less than 70 mg/dL)   STEP 1: Take 15 grams of carbohydrates when your blood sugar is low, which includes:  3-4 GLUCOSE TABS  OR 3-4 OZ OF JUICE OR REGULAR SODA OR ONE TUBE OF GLUCOSE GEL    STEP 2: RECHECK blood sugar in 15 MINUTES STEP 3: If your blood sugar is still low at the 15 minute recheck --> then, go back to STEP 1 and treat AGAIN with another 15 grams of carbohydrates.

## 2022-10-22 ENCOUNTER — Ambulatory Visit: Payer: Medicare HMO | Attending: Critical Care Medicine | Admitting: Critical Care Medicine

## 2022-10-22 ENCOUNTER — Encounter: Payer: Self-pay | Admitting: Critical Care Medicine

## 2022-10-22 VITALS — BP 124/86 | HR 88 | Wt 258.6 lb

## 2022-10-22 DIAGNOSIS — J439 Emphysema, unspecified: Secondary | ICD-10-CM | POA: Insufficient documentation

## 2022-10-22 DIAGNOSIS — J432 Centrilobular emphysema: Secondary | ICD-10-CM | POA: Diagnosis not present

## 2022-10-22 DIAGNOSIS — Z6841 Body Mass Index (BMI) 40.0 and over, adult: Secondary | ICD-10-CM | POA: Insufficient documentation

## 2022-10-22 DIAGNOSIS — E1165 Type 2 diabetes mellitus with hyperglycemia: Secondary | ICD-10-CM | POA: Diagnosis not present

## 2022-10-22 DIAGNOSIS — G4733 Obstructive sleep apnea (adult) (pediatric): Secondary | ICD-10-CM | POA: Diagnosis not present

## 2022-10-22 DIAGNOSIS — E785 Hyperlipidemia, unspecified: Secondary | ICD-10-CM | POA: Insufficient documentation

## 2022-10-22 DIAGNOSIS — I10 Essential (primary) hypertension: Secondary | ICD-10-CM | POA: Diagnosis not present

## 2022-10-22 DIAGNOSIS — Z7982 Long term (current) use of aspirin: Secondary | ICD-10-CM | POA: Diagnosis not present

## 2022-10-22 DIAGNOSIS — I7143 Infrarenal abdominal aortic aneurysm, without rupture: Secondary | ICD-10-CM | POA: Diagnosis not present

## 2022-10-22 DIAGNOSIS — Z79899 Other long term (current) drug therapy: Secondary | ICD-10-CM | POA: Diagnosis not present

## 2022-10-22 DIAGNOSIS — I129 Hypertensive chronic kidney disease with stage 1 through stage 4 chronic kidney disease, or unspecified chronic kidney disease: Secondary | ICD-10-CM | POA: Insufficient documentation

## 2022-10-22 DIAGNOSIS — N1832 Chronic kidney disease, stage 3b: Secondary | ICD-10-CM | POA: Insufficient documentation

## 2022-10-22 DIAGNOSIS — F17210 Nicotine dependence, cigarettes, uncomplicated: Secondary | ICD-10-CM | POA: Insufficient documentation

## 2022-10-22 DIAGNOSIS — Z720 Tobacco use: Secondary | ICD-10-CM | POA: Diagnosis not present

## 2022-10-22 DIAGNOSIS — N1831 Chronic kidney disease, stage 3a: Secondary | ICD-10-CM | POA: Diagnosis not present

## 2022-10-22 DIAGNOSIS — I714 Abdominal aortic aneurysm, without rupture, unspecified: Secondary | ICD-10-CM | POA: Diagnosis not present

## 2022-10-22 DIAGNOSIS — Z794 Long term (current) use of insulin: Secondary | ICD-10-CM

## 2022-10-22 DIAGNOSIS — Z716 Tobacco abuse counseling: Secondary | ICD-10-CM | POA: Diagnosis not present

## 2022-10-22 DIAGNOSIS — E1122 Type 2 diabetes mellitus with diabetic chronic kidney disease: Secondary | ICD-10-CM

## 2022-10-22 DIAGNOSIS — E669 Obesity, unspecified: Secondary | ICD-10-CM | POA: Insufficient documentation

## 2022-10-22 DIAGNOSIS — R6889 Other general symptoms and signs: Secondary | ICD-10-CM | POA: Diagnosis not present

## 2022-10-22 DIAGNOSIS — E1169 Type 2 diabetes mellitus with other specified complication: Secondary | ICD-10-CM

## 2022-10-22 MED ORDER — ATORVASTATIN CALCIUM 10 MG PO TABS: 10.0000 mg | ORAL_TABLET | Freq: Every day | ORAL | 3 refills | Status: DC

## 2022-10-22 MED ORDER — LISINOPRIL 2.5 MG PO TABS: 2.5000 mg | ORAL_TABLET | Freq: Every day | ORAL | 3 refills | Status: DC

## 2022-10-22 MED ORDER — CHLORTHALIDONE 25 MG PO TABS: 25.0000 mg | ORAL_TABLET | Freq: Every day | ORAL | 0 refills | Status: DC

## 2022-10-22 MED ORDER — FENOFIBRATE 134 MG PO CAPS: 134.0000 mg | ORAL_CAPSULE | Freq: Every day | ORAL | 3 refills | Status: DC

## 2022-10-22 MED ORDER — ASPIRIN 81 MG PO TBEC: 81.0000 mg | DELAYED_RELEASE_TABLET | Freq: Every day | ORAL | 1 refills | Status: DC

## 2022-10-22 MED ORDER — GABAPENTIN 100 MG PO CAPS: 100.0000 mg | ORAL_CAPSULE | Freq: Two times a day (BID) | ORAL | 2 refills | Status: DC

## 2022-10-22 NOTE — Addendum Note (Signed)
Addended by: Asencion Noble E on: 10/22/2022 01:41 PM   Modules accepted: Orders

## 2022-10-22 NOTE — Assessment & Plan Note (Signed)
Reassess metabolic panel 

## 2022-10-22 NOTE — Assessment & Plan Note (Signed)
  .   Current smoking consumption amount: Several cigarettes a day  . Dicsussion on advise to quit smoking and smoking impacts: Cardiovascular impact  . Patient's willingness to quit: Willing to quit  . Methods to quit smoking discussed: Nicotine replacement  . Medication management of smoking session drugs discussed: Nicotine replacement  . Resources provided:  AVS   . Setting quit date not established  . Follow-up arranged 4 months   Time spent counseling the patient: 5 minutes  

## 2022-10-22 NOTE — Assessment & Plan Note (Signed)
Reassess renal function 

## 2022-10-22 NOTE — Assessment & Plan Note (Signed)
Continue inhaled medicines 

## 2022-10-22 NOTE — Progress Notes (Signed)
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

## 2022-10-22 NOTE — Assessment & Plan Note (Signed)
As per endocrinology ?

## 2022-10-22 NOTE — Assessment & Plan Note (Signed)
Continue statin and monitor control blood pressure

## 2022-10-22 NOTE — Assessment & Plan Note (Signed)
Continue CPAP.  

## 2022-10-22 NOTE — Assessment & Plan Note (Signed)
Blood pressure at goal no changes made ?

## 2022-10-22 NOTE — Patient Instructions (Signed)
Medications refilled  Labs obtained Return 4 months

## 2022-10-22 NOTE — Assessment & Plan Note (Signed)
Reactive lipids continue atorvastatin

## 2022-10-23 LAB — COMPREHENSIVE METABOLIC PANEL
ALT: 24 IU/L (ref 0–44)
AST: 26 IU/L (ref 0–40)
Albumin/Globulin Ratio: 1.7 (ref 1.2–2.2)
Albumin: 4.7 g/dL (ref 3.9–4.9)
Alkaline Phosphatase: 46 IU/L (ref 44–121)
BUN/Creatinine Ratio: 16 (ref 10–24)
BUN: 27 mg/dL (ref 8–27)
Bilirubin Total: 0.3 mg/dL (ref 0.0–1.2)
CO2: 24 mmol/L (ref 20–29)
Calcium: 10.4 mg/dL — ABNORMAL HIGH (ref 8.6–10.2)
Chloride: 99 mmol/L (ref 96–106)
Creatinine, Ser: 1.7 mg/dL — ABNORMAL HIGH (ref 0.76–1.27)
Globulin, Total: 2.7 g/dL (ref 1.5–4.5)
Glucose: 85 mg/dL (ref 70–99)
Potassium: 3.7 mmol/L (ref 3.5–5.2)
Sodium: 141 mmol/L (ref 134–144)
Total Protein: 7.4 g/dL (ref 6.0–8.5)
eGFR: 43 mL/min/{1.73_m2} — ABNORMAL LOW (ref >59)

## 2022-10-23 LAB — LIPID PANEL
Chol/HDL Ratio: 4.3 ratio (ref 0.0–5.0)
Cholesterol, Total: 133 mg/dL (ref 100–199)
HDL: 31 mg/dL — ABNORMAL LOW (ref >39)
LDL Chol Calc (NIH): 71 mg/dL (ref 0–99)
Triglycerides: 180 mg/dL — ABNORMAL HIGH (ref 0–149)
VLDL Cholesterol Cal: 31 mg/dL (ref 5–40)

## 2022-10-23 NOTE — Progress Notes (Signed)
Let pt know liver kidney normal  cholesterol at goal no med changes

## 2022-10-26 ENCOUNTER — Encounter: Payer: Self-pay | Admitting: Internal Medicine

## 2022-10-26 ENCOUNTER — Telehealth: Payer: Self-pay

## 2022-10-26 NOTE — Telephone Encounter (Signed)
-----   Message from Storm Frisk, MD sent at 10/23/2022  9:32 AM EDT ----- Let pt know liver kidney normal  cholesterol at goal no med changes

## 2022-10-26 NOTE — Telephone Encounter (Signed)
Pt was called and vm was left, Information has been sent to nurse pool.   

## 2022-10-27 NOTE — Congregational Nurse Program (Signed)
Dept: [phone]   Congregational Nurse Program Note  Date of Encounter: 10/27/2022 Past Medical History: Past Medical History:  Diagnosis Date   Diabetes mellitus without complication    Hypertension    Stroke     Encounter Details:  CNP Questionnaire - 10/27/22 1330       Questionnaire   Ask client: Do you give verbal consent for me to treat you today? Yes    Student Assistance N/A    Location Patient [place]    Visit Setting with Client Organization    Patient Status Unknown   Has apartment at [facility] Medicare;Medicaid    Insurance/Financial Assistance Referral N/A    Medication N/A    Medical Provider Yes    Screening Referrals Made N/A    Medical Referrals Made N/A    Medical Appointment Made N/A    Recently w/o PCP, now 1st time PCP visit completed due to CNs referral or appointment made N/A    Food N/A    Transportation Need transportation assistance    Housing/Utilities N/A    Interpersonal Safety N/A    Interventions Counsel;Advocate/Support;Educate;Case Management    Abnormal to Normal Screening Since Last CN Visit N/A    Screenings CN Performed Blood Pressure;Pulse Ox;Weight    Sent Client to Lab for: N/A    Did client attend any of the following based off CNs referral or appointments made? N/A    ED Visit Averted N/A    Life-Saving Intervention Made N/A      Questionnaire   Do you give verbal consent to treat you today? Yes    Location Patient Served  Not Applicable    Visit Setting Church or Organization    Patient Status Unknown    Insurance Medicare    Insurance Referral N/A    Medication N/A    Screening Referrals N/A    Intervention Blood pressure;Blood glucose;Counsel;Educate               Dept: [phone]   Congregational Nurse Program Note  Date of Encounter: 10/27/2022  Clinic visit to check blood pressure and to discuss weight loss concerns.  BP 114/80, pulse 88 and regular.   States blood glucose 123 this am and and 163 this afternoon per the continuous glucose monitor.  Has been using foot cream and has very minimal amount of scaly areas on both heels, encouraged to continue to use the foot cream.  Weight is 259, reviewed lab results from 4/4 MD appointment. Explained HDL and LDL and that HDL can be improved by eating more high fiber foods oats, leafy green vegetables and berries.  Past Medical History: Past Medical History:  Diagnosis Date   Diabetes mellitus without complication    Hypertension    Stroke     Encounter Details:  CNP Questionnaire - 10/27/22 1330       Questionnaire   Ask client: Do you give verbal consent for me to treat you today? Yes    Student Assistance N/A    Location Patient [facility]    Visit Setting with Client Organization    Patient Status Unknown   Has apartment at [facility] Medicare;Medicaid    Insurance/Financial Assistance Referral N/A    Medication N/A    Medical Provider Yes    Screening Referrals Made N/A    Medical Referrals Made N/A    Medical Appointment Made N/A    Recently w/o PCP, now 1st  time PCP visit completed due to CNs referral or appointment made N/A    Food N/A    Transportation Need transportation assistance    Housing/Utilities N/A    Interpersonal Safety N/A    Interventions Counsel;Advocate/Support;Educate;Reviewed Medications    Abnormal to Normal Screening Since Last CN Visit N/A    Screenings CN Performed N/A    Sent Client to Lab for: N/A    Did client attend any of the following based off CNs referral or appointments made? N/A    ED Visit Averted N/A    Life-Saving Intervention Made N/A      Questionnaire   Do you give verbal consent to treat you today? Yes    Location Patient Served  Not Applicable    Visit Setting Church or Organization    Patient Status Unknown    Insurance Medicare    Insurance Referral N/A    Medication N/A    Screening  Referrals N/A    Intervention Blood pressure;Blood glucose;Counsel;Educate

## 2022-11-03 NOTE — Congregational Nurse Program (Signed)
  Dept: [phone]   Congregational Nurse Program Note  Date of Encounter: 11/03/2022  Clinic visit to check blood pressure and follow-up on referral to [facility]. BP 123/79, pulse 94, O2 Sat 97%.  Taking medications as prescribed. Complaining of some back pain today but did not feel MD needed to prescribe any medication for it.  [facility] and office had not called him d/t no appointments available this month.  Placed him on list for cancellations and made appointment for Tuesday, July 23 at 2:30P if nothing available before that date.  Weight 259 lbs, blood sugars in AM in the 120s using Freestyle Libre, no blood glucose higher than 170 in PM.  Educated regarding carbohydrate foods and glycemic levels of different carboydrates. Past Medical History: Past Medical History:  Diagnosis Date   Diabetes mellitus without complication    Hypertension    Stroke     Encounter Details:  CNP Questionnaire - 11/03/22 1440       Questionnaire   Ask client: Do you give verbal consent for me to treat you today? Yes    Student Assistance N/A    Location Patient [place]    Visit Setting with Client Organization    Patient Status Unknown   Has apartment at [facility] Medicare;Medicaid    Insurance/Financial Assistance Referral N/A    Medication N/A    Medical Provider Yes    Screening Referrals Made N/A    Medical Referrals Made N/A    Medical Appointment Made [facility]    Recently w/o PCP, now 1st time PCP visit completed due to CNs referral or appointment made N/A    Food N/A    Transportation Need transportation assistance    Housing/Utilities N/A    Interpersonal Safety N/A    Interventions Counsel;Advocate/Support;Educate;Case Management    Abnormal to Normal Screening Since Last CN Visit N/A    Screenings CN Performed Blood Pressure;Pulse Ox;Weight    Sent Client to Lab for: N/A    Did client  attend any of the following based off CNs referral or appointments made? N/A    ED Visit Averted N/A    Life-Saving Intervention Made N/A      Questionnaire   Do you give verbal consent to treat you today? Yes    Location Patient Served  Not Applicable    Visit Setting Church or Organization    Patient Status Unknown    Insurance Medicare    Insurance Referral N/A    Medication N/A    Screening Referrals N/A    Intervention Blood pressure;Blood glucose;Counsel;Educate

## 2022-11-09 ENCOUNTER — Other Ambulatory Visit: Payer: Self-pay | Admitting: Critical Care Medicine

## 2022-11-10 NOTE — Congregational Nurse Program (Signed)
  Dept: [phone]   Congregational Nurse Program Note  Date of Encounter: 11/10/2022  Clinic visit to check blood pressure and weight, BP 105/82, pulse 88 and regular, O2 Sat 96%.  Weight 259 Lbs.  Blood glucose per Freestyle Libre continuous glucose monitor 114 this Am, 135 PC dinner.  Reviewed meals, is eating more vegetables and drinking more water.  Continues to smoke half pack cigarettes and drink approximately most of a 12 oz Pepsi each day.  Educated regarding strategies to continue weight loss and decrease sweetened drink. Past Medical History: Past Medical History:  Diagnosis Date   Diabetes mellitus without complication    Hypertension    Stroke     Encounter Details:  CNP Questionnaire - 11/10/22 1615       Questionnaire   Ask client: Do you give verbal consent for me to treat you today? Yes    Student Assistance N/A    Location Patient [place]    Visit Setting with Client Organization    Patient Status Unknown   Has apartment at [facility] Medicare;Medicaid    Insurance/Financial Assistance Referral N/A    Medication N/A    Medical Provider Yes    Screening Referrals Made N/A    Medical Referrals Made [facility]    Medical Appointment Made N/A    Recently w/o PCP, now 1st time PCP visit completed due to CNs referral or appointment made N/A    Food N/A    Transportation Need transportation assistance    Housing/Utilities N/A    Interpersonal Safety N/A    Interventions Counsel;Advocate/Support;Educate    Abnormal to Normal Screening Since Last CN Visit N/A    Screenings CN Performed Blood Pressure;Pulse Ox;Weight    Sent Client to Lab for: N/A    Did client attend any of the following based off CNs referral or appointments made? N/A    ED Visit Averted N/A    Life-Saving Intervention Made N/A      Questionnaire   Do you give verbal consent to treat you today? Yes    Location Patient Served  Not Applicable    Visit  Setting Church or Organization    Patient Status Unknown    Insurance Medicare    Insurance Referral N/A    Medication N/A    Screening Referrals N/A    Intervention Blood pressure;Blood glucose;Counsel;Educate

## 2022-11-16 ENCOUNTER — Other Ambulatory Visit: Payer: Self-pay | Admitting: Critical Care Medicine

## 2022-11-16 MED ORDER — GABAPENTIN 100 MG PO CAPS: 100.0000 mg | ORAL_CAPSULE | Freq: Two times a day (BID) | ORAL | 2 refills | Status: DC

## 2022-11-16 NOTE — Telephone Encounter (Signed)
Will forward pended gabapentin for approval to the provider covering for Dr. Delford Field.

## 2022-11-16 NOTE — Telephone Encounter (Signed)
Copied from CRM [id number]. Topic: General - Other >> Nov 16, 2022 12:44 PM Turkey B wrote: Reason for CRM: pt caled in states he is moving into apt. He needs a ramp

## 2022-11-16 NOTE — Addendum Note (Signed)
Addended by: Lois Huxley, Jeannett Senior L on: 11/16/2022 03:14 PM   Modules accepted: Orders

## 2022-11-18 NOTE — Telephone Encounter (Signed)
I spoke to the patient and informed him that the order was sent for gabapentin and regarding the letter for the ramp, we will defer that to Dr Delford Field when he returns next week.  The patient was very appreciative

## 2022-11-25 NOTE — Congregational Nurse Program (Signed)
  Dept: [phone]   Congregational Nurse Program Note  Date of Encounter: 11/24/2022  Clinic visit to check blood pressure and weight, BP 115/76, pulse 90 and regular, O2 Sat 95%.  Weight 261 lbs.  Blood glucose per continuous glucose monitor 120 before breakfast and 135 PC lunch.  Weight is lower, discussed making food choices to lose weight and continue to manage blood glucose without having low levels.  Past Medical History: Past Medical History:  Diagnosis Date   Diabetes mellitus without complication (HCC)    Hypertension    Stroke [facility])     Encounter Details:  CNP Questionnaire - 11/24/22 1620       Questionnaire   Ask client: Do you give verbal consent for me to treat you today? Yes    Student Assistance N/A    Location Patient [place]    Visit Setting with Client Organization    Patient Status Unknown   Has apartment at [facility] Medicare;Medicaid    Insurance/Financial Assistance Referral N/A    Medication N/A    Medical Provider Yes    Screening Referrals Made N/A    Medical Referrals Made N/A    Medical Appointment Made N/A    Recently w/o PCP, now 1st time PCP visit completed due to CNs referral or appointment made N/A    Food N/A    Transportation Need transportation assistance    Housing/Utilities N/A    Interpersonal Safety N/A    Interventions Counsel;Advocate/Support;Educate    Abnormal to Normal Screening Since Last CN Visit N/A    Screenings CN Performed Blood Pressure;Pulse Ox;Weight    Sent Client to Lab for: N/A    Did client attend any of the following based off CNs referral or appointments made? N/A    ED Visit Averted N/A    Life-Saving Intervention Made N/A      Questionnaire   Do you give verbal consent to treat you today? Yes    Location Patient Served  Not Applicable    Visit Setting Church or Organization    Patient Status Unknown    Insurance Medicare    Insurance Referral N/A     Medication N/A    Screening Referrals N/A    Intervention Blood pressure;Blood glucose;Counsel;Educate

## 2022-11-30 ENCOUNTER — Telehealth: Payer: Self-pay

## 2022-11-30 NOTE — Telephone Encounter (Signed)
Copied from CRM [id number]. Topic: General - Other >> Nov 30, 2022  9:11 AM Macon Large wrote: Reason for CRM: Pt stated that he needs a letter from Dr. Delford Field for a ramp for the hoveround. Pt requests that Erskine Squibb return his call at [phone]

## 2022-12-02 ENCOUNTER — Other Ambulatory Visit: Payer: Self-pay | Admitting: Internal Medicine

## 2022-12-02 NOTE — Telephone Encounter (Signed)
Reginald Rodriguez this pt asking for a ramp with his hoverround  [place] lives in [place]  ? Will [facility] assist in this?

## 2022-12-03 ENCOUNTER — Encounter: Payer: Self-pay | Admitting: Critical Care Medicine

## 2022-12-03 NOTE — Telephone Encounter (Signed)
I called the patient to let him know the letter is ready for him and he said to disregard it.  The manager of the apartment where he was planning to go told him that the ramp would be too expensive so he will now need to find another apartment.

## 2022-12-03 NOTE — Telephone Encounter (Signed)
Letter produced 

## 2022-12-04 NOTE — Telephone Encounter (Signed)
Noted  

## 2022-12-08 NOTE — Congregational Nurse Program (Signed)
  Dept: [phone]   Congregational Nurse Program Note  Date of Encounter: 10/27/2022  Clinic visit to check blood pressure and weight,  BP 114/80, pulse 88 and regular.  Weight 259 Lbs, discussed making food choices to lose weight.  He agreed to try different foods for breakfast instead of sandwiches each morning and to try frozen vegetables to increase amount of vegetables in diet. Past Medical History: Past Medical History:  Diagnosis Date   Diabetes mellitus without complication (HCC)    Hypertension    Stroke [facility])     Encounter Details:

## 2022-12-08 NOTE — Congregational Nurse Program (Signed)
  Dept: [phone]   Congregational Nurse Program Note  Date of Encounter: 12/08/2022  Clinic visit to check blood pressure and weight.  BP 113/76, pulse 96 and regular, O2 Sat 99%.  Weight 256 lb, has lost 5 lbs in past month.  Blood glucose 132 via Freestyle Llibre continuous glucose monitor, was 107 AC breakfast. Discussed meals for past two days, encouraged to eat green vegetables every day.  Educated on use of vegetables in steamer bags.    Discussed issue that he has had with home health aide. Agency is sending a new HHA on tomorrow, encouraged to review agency care plan with new HHA to ensure understanding of expectations to follow care plan each visit. Past Medical History: Past Medical History:  Diagnosis Date   Diabetes mellitus without complication (HCC)    Hypertension    Stroke [facility])     Encounter Details:  CNP Questionnaire - 12/08/22 1400       Questionnaire   Ask client: Do you give verbal consent for me to treat you today? Yes    Student Assistance N/A    Location Patient [place]    Visit Setting with Client Organization    Patient Status Unknown   Has apartment at [facility] Medicare;Medicaid    Insurance/Financial Assistance Referral N/A    Medication N/A    Medical Provider Yes    Screening Referrals Made N/A    Medical Referrals Made N/A    Medical Appointment Made N/A    Recently w/o PCP, now 1st time PCP visit completed due to CNs referral or appointment made N/A    Food N/A    Transportation Need transportation assistance    Housing/Utilities N/A    Interpersonal Safety N/A    Interventions Counsel;Advocate/Support;Educate;Spiritual Care;Case Management    Abnormal to Normal Screening Since Last CN Visit N/A    Screenings CN Performed Blood Pressure;Pulse Ox;Weight    Sent Client to Lab for: N/A    Did client attend any of the following based off CNs referral or appointments made? N/A    ED Visit Averted  N/A    Life-Saving Intervention Made N/A      Questionnaire   Do you give verbal consent to treat you today? Yes    Location Patient Served  Not Applicable    Visit Setting Church or Organization    Patient Status Unknown    Insurance Medicare    Insurance Referral N/A    Medication N/A    Screening Referrals N/A    Intervention Blood pressure;Blood glucose;Counsel;Educate

## 2022-12-15 NOTE — Congregational Nurse Program (Signed)
  Dept: [phone]   Congregational Nurse Program Note  Date of Encounter: 12/15/2022  Clinic visit to check blood pressure and weight.  BP 113/76, pulse 98 and regular.  Weight 259 Lbs, is not losing weight but has not gained any weight this month.  Discussed food intake for past 3 days and recommended to continue to limit soft drinks and eat more vegetables.  Blood glucose AC dinner 157, this AM blood glucose 112 both per continuous glucose monitor wit Freestyle Libre.  Educated regarding neuropathy after statement that he is having less tingling in his feet and that symptoms decrease with improved level of blood glucose. Past Medical History: Past Medical History:  Diagnosis Date   Diabetes mellitus without complication (HCC)    Hypertension    Stroke [facility])     Encounter Details:  CNP Questionnaire - 12/15/22 1600       Questionnaire   Ask client: Do you give verbal consent for me to treat you today? Yes    Student Assistance N/A    Location Patient [place]    Visit Setting with Client Organization    Patient Status Unknown   Has apartment at [facility] Medicare;Medicaid    Insurance/Financial Assistance Referral N/A    Medication N/A    Medical Provider Yes    Screening Referrals Made N/A    Medical Referrals Made N/A    Medical Appointment Made N/A    Recently w/o PCP, now 1st time PCP visit completed due to CNs referral or appointment made N/A    Food N/A    Transportation Need transportation assistance    Housing/Utilities N/A    Interpersonal Safety N/A    Interventions Counsel;Advocate/Support;Educate;Spiritual Care    Abnormal to Normal Screening Since Last CN Visit N/A    Screenings CN Performed Blood Pressure;Weight    Sent Client to Lab for: N/A    Did client attend any of the following based off CNs referral or appointments made? N/A    ED Visit Averted N/A    Life-Saving Intervention Made N/A      Questionnaire    Do you give verbal consent to treat you today? Yes    Location Patient Served  Not Applicable    Visit Setting Church or Organization    Patient Status Unknown    Insurance Medicare    Insurance Referral N/A    Medication N/A    Screening Referrals N/A    Intervention Blood pressure;Blood glucose;Counsel;Educate

## 2022-12-21 ENCOUNTER — Other Ambulatory Visit (HOSPITAL_COMMUNITY): Payer: Self-pay

## 2022-12-21 ENCOUNTER — Other Ambulatory Visit: Payer: Self-pay

## 2022-12-22 NOTE — Congregational Nurse Program (Signed)
  Dept: [phone]   Congregational Nurse Program Note  Date of Encounter: 12/22/2022  Clinic visit to check weight and blood pressure.  BP 108/82, pulse 96 and regular, O2 Sat 95%.  Weight 260 Lbs, discussed episode of feeling weak after being outside yesterday.  Blood sugar was 120 with no other symptoms per his report.  Educated regarding staying hydrated during time outside in warm weather. Past Medical History: Past Medical History:  Diagnosis Date   Diabetes mellitus without complication (HCC)    Hypertension    Stroke [facility])     Encounter Details:  CNP Questionnaire - 12/22/22 1415       Questionnaire   Ask client: Do you give verbal consent for me to treat you today? Yes    Student Assistance N/A    Location Patient [place]    Visit Setting with Client Organization    Patient Status Unknown   Has apartment at [facility] Medicare;Medicaid    Insurance/Financial Assistance Referral N/A    Medication N/A    Medical Provider Yes    Screening Referrals Made N/A    Medical Referrals Made Non-[facility] PCP/Clinic    Medical Appointment Made N/A    Recently w/o PCP, now 1st time PCP visit completed due to CNs referral or appointment made N/A    Food N/A    Transportation Need transportation assistance    Housing/Utilities N/A    Interpersonal Safety N/A    Interventions Counsel;Advocate/Support;Educate;Spiritual Care    Abnormal to Normal Screening Since Last CN Visit N/A    Screenings CN Performed Blood Pressure;Weight;Pulse Ox    Sent Client to Lab for: N/A    Did client attend any of the following based off CNs referral or appointments made? N/A    ED Visit Averted N/A    Life-Saving Intervention Made N/A      Questionnaire   Do you give verbal consent to treat you today? Yes    Location Patient Served  Not Applicable    Visit Setting Church or Organization    Patient Status Unknown    Insurance Medicare    Insurance Referral  N/A    Medication N/A    Screening Referrals N/A    Intervention Blood pressure;Blood glucose;Counsel;Educate

## 2022-12-23 ENCOUNTER — Telehealth: Payer: Self-pay | Admitting: Critical Care Medicine

## 2022-12-23 NOTE — Telephone Encounter (Signed)
Reginald Rodriguez from [employer]  Nurses and Doctor line phone # [phone]  Urgent fax # [phone]   Caller states this is an urgent request due to patient being without his Reginald Rodriguez 2 sensor. Physician written order form was received but since changes were made by physician on question #8 [facility] physician has to initial and date. Also, caller states provider name is not legible. Form was faxed to [phone] today to make the necessary updates and please fax back to the "urgent fax, # [phone]. Please note when completed.

## 2022-12-23 NOTE — Telephone Encounter (Signed)
This office is unable to find the fax that was sent . Request made to have [facility] sent to my email as secure. Spoke with jess. Once received well forward to Provider for correction.

## 2022-12-28 DIAGNOSIS — E1165 Type 2 diabetes mellitus with hyperglycemia: Secondary | ICD-10-CM | POA: Diagnosis not present

## 2022-12-29 NOTE — Congregational Nurse Program (Signed)
  Dept: [phone]   Congregational Nurse Program Note  Date of Encounter: 12/29/2022  Clinic visit to check weight and blood pressure.  BP 117/89, pulse 90 and regular, O2 St 99%.  Weight 260 Lbs, blood glucose this AM 108, pc lunch blood glucose 152.  Reviewed meals for past three days.  Complains of some episodes for blurred vision and has made ophthalmology appointment.  Episode did not occur with lower blood glucose level. Past Medical History: Past Medical History:  Diagnosis Date   Diabetes mellitus without complication (HCC)    Hypertension    Stroke [facility])     Encounter Details:  CNP Questionnaire - 12/29/22 1448       Questionnaire   Ask client: Do you give verbal consent for me to treat you today? Yes    Student Assistance N/A    Location Patient [place]    Visit Setting with Client Organization    Patient Status Unknown   Has apartment at [facility] Medicare;Medicaid    Insurance/Financial Assistance Referral N/A    Medication N/A    Medical Provider Yes    Screening Referrals Made N/A    Medical Referrals Made N/A    Medical Appointment Made N/A    Recently w/o PCP, now 1st time PCP visit completed due to CNs referral or appointment made N/A    Food N/A    Transportation Need transportation assistance    Housing/Utilities N/A    Interpersonal Safety N/A    Interventions Counsel;Advocate/Support;Educate;Spiritual Care;Reviewed Medications    Abnormal to Normal Screening Since Last CN Visit N/A    Screenings CN Performed Blood Pressure;Weight;Pulse Ox    Sent Client to Lab for: N/A    Did client attend any of the following based off CNs referral or appointments made? N/A    ED Visit Averted N/A    Life-Saving Intervention Made N/A      Questionnaire   Do you give verbal consent to treat you today? Yes    Location Patient Served  Not Applicable    Visit Setting Church or Organization    Patient Status Unknown     Insurance Medicare    Insurance Referral N/A    Medication N/A    Screening Referrals N/A    Intervention Blood pressure;Blood glucose;Counsel;Educate

## 2022-12-30 ENCOUNTER — Telehealth: Payer: Self-pay | Admitting: Pharmacist

## 2022-12-30 DIAGNOSIS — N1832 Chronic kidney disease, stage 3b: Secondary | ICD-10-CM

## 2022-12-30 MED ORDER — FREESTYLE LIBRE 2 SENSOR MISC: 2 refills | Status: DC

## 2022-12-30 NOTE — Telephone Encounter (Signed)
Thanks [employer]. I will fu with him. I havent seen any paperwork. Will chk again

## 2022-12-30 NOTE — Telephone Encounter (Addendum)
Hey friend -   This patient is reporting some blurred vision with our nurse care team. I reviewed his chart and it looks like his Medicare plan was sending you something regarding his CGM/Libre. Do you know if he has been approved for the CGM?   Briefly, I connected with this patient. Confirmed patient identifiers. He is endorsing occasional blurred vision. Reports that it has been blurry now for about ~1 month. No acute changes in the last couple of days or weeks. He does have an old rxn for glasses that helps. Denies any symptoms of hyperglycemia or hypoglycemia. Specifically, he denies any dizziness, lightheadedness, shaking, or sweating. Home CBGs as follows:   AM, fasting: 108 -125  PM, post-prandial/random: 150s   Confirmed he has an eye MD next month at [facility]. He tells me he feels well overall.   Additionally, I confirmed he has supplies to check his blood sugar at home the conventional way (finger-stick method). He has been checking his blood sugar this way and tells me his mailorder pharmacy is sending more sensors for the [place] system. Just in case, I sent a rxn for more sensors to the pharmacy.

## 2022-12-31 NOTE — Telephone Encounter (Signed)
Add on this pt next week carly, luke pls find   cgm paperwork

## 2023-01-01 NOTE — Telephone Encounter (Signed)
Called patient I was not able to double book or triple book for next week but I did schedule it on 01/14/23 at 830

## 2023-01-05 NOTE — Congregational Nurse Program (Signed)
  Dept: [phone]   Congregational Nurse Program Note  Date of Encounter: 01/05/2023  Clinic visit to check weight and blood pressure, BP 114/75, pulse 90 and regular, O2 Sat 97%. No complaints of any numbness or tingling in feet since blood glucose levels have been lower.  Weight 263 Lbs.  AM blood glucose levels between 109 and 120, 109 this AM using Freestyle Libre continuous glucose monitor.  Blood glucose 192 during clinic visit approximately 1 hour after lunch.  Educated regarding limiting soft drinks and other items sweetened with sugar and to drink more water. Past Medical History: Past Medical History:  Diagnosis Date   Diabetes mellitus without complication (HCC)    Hypertension    Stroke [facility])     Encounter Details:  CNP Questionnaire - 01/05/23 1420       Questionnaire   Ask client: Do you give verbal consent for me to treat you today? Yes    Student Assistance N/A    [place]    Visit Setting with Client Organization    Patient Status Unknown   Has apartment at [facility] Medicare;Medicaid    Insurance/Financial Assistance Referral N/A    Medication N/A    Medical Provider Yes    Screening Referrals Made N/A    Medical Referrals Made N/A    Medical Appointment Made N/A    Recently w/o PCP, now 1st time PCP visit completed due to CNs referral or appointment made N/A    Food N/A    Transportation Need transportation assistance    Housing/Utilities N/A    Interpersonal Safety N/A    Interventions Counsel;Advocate/Support;Educate;Spiritual Care    Abnormal to Normal Screening Since Last CN Visit N/A    Screenings CN Performed Blood Pressure;Weight;Pulse Ox    Sent Client to Lab for: N/A    Did client attend any of the following based off CNs referral or appointments made? N/A    ED Visit Averted N/A    Life-Saving Intervention Made N/A      Questionnaire   Do you give verbal consent to treat you  today? Yes    Location Patient Served  Not Applicable    Visit Setting Church or Organization    Patient Status Unknown    Insurance Medicare    Insurance Referral N/A    Medication N/A    Screening Referrals N/A    Intervention Blood pressure;Blood glucose;Counsel;Educate

## 2023-01-06 ENCOUNTER — Ambulatory Visit: Payer: Medicare HMO | Admitting: Podiatry

## 2023-01-11 ENCOUNTER — Ambulatory Visit (INDEPENDENT_AMBULATORY_CARE_PROVIDER_SITE_OTHER): Payer: Medicare HMO | Admitting: Podiatry

## 2023-01-11 DIAGNOSIS — B351 Tinea unguium: Secondary | ICD-10-CM | POA: Diagnosis not present

## 2023-01-11 DIAGNOSIS — M79674 Pain in right toe(s): Secondary | ICD-10-CM

## 2023-01-11 DIAGNOSIS — M79675 Pain in left toe(s): Secondary | ICD-10-CM

## 2023-01-11 NOTE — Progress Notes (Signed)
       Subjective:  Patient ID: Reginald Rodriguez, male    DOB: [birth year],  MRN: [id number]   Reginald Rodriguez presents to clinic today for:  Chief Complaint  Patient presents with   Diabetes    Diabetic foot care   . Patient notes nails are thick, discolored, elongated and painful in shoegear when trying to ambulate.    PCP is Storm Frisk, MD.  Allergies  Allergen Reactions   Bee Venom Swelling   Penicillins Rash    Review of Systems: Negative except as noted in the HPI.  Objective:  There were no vitals filed for this visit.  Reginald Rodriguez is a pleasant 72 y.o. male in NAD. AAO x 3.  Vascular Examination: Capillary refill time is 3-5 seconds to toes bilateral. Palpable pedal pulses b/l LE. Digital hair present b/l. No pedal edema b/l. Skin temperature gradient WNL b/l. No varicosities b/l. No cyanosis or clubbing noted b/l.   Dermatological Examination: Pedal skin with normal turgor, texture and tone b/l. No open wounds. No interdigital macerations b/l. Toenails x10 are 3mm thick, discolored, dystrophic with subungual debris. There is pain with compression of the nail plates.  They are elongated x10.  Skin is dry bilateral heels  Neurological Examination: Protective sensation intact bilateral LE. Vibratory sensation intact bilateral LE.      Latest Ref Rng & Units 10/21/2022    9:37 AM 04/02/2022    8:53 AM  Hemoglobin A1C  Hemoglobin-A1c 4.0 - 5.6 % 7.9  7.6     Assessment/Plan: 1. Pain due to onychomycosis of toenails of both feet     The mycotic toenails were sharply debrided x10 with sterile nail nippers and a power debriding burr to decrease bulk/thickness and length.   Return in about 3 months (around 04/13/2023) for [facility].   Clerance Lav, DPM, FACFAS [facility]     [address] [place], [place] [address]                Office [phone]  Fax [phone]

## 2023-01-12 NOTE — Congregational Nurse Program (Signed)
  Dept: [phone]   Congregational Nurse Program Note  Date of Encounter: 01/12/2023  Clinic visit to check weight and blood pressure.  BP 116/80, pulse 97 and regular, O2 Sat 95%.  Blood glucose 239 per freestyle Libre CGM at time of visit, was 121 this AM.  AM levels have been between 110 and 120 most days, 131 one day in past week.  Examined feet, no complaints of numbness or tingling, circulation WNL.  Has had several episode of leg cramps, educated regarding staying hydrated by drinking water and having potassium rich foods. Past Medical History: Past Medical History:  Diagnosis Date   Diabetes mellitus without complication (HCC)    Hypertension    Stroke [facility])     Encounter Details:  CNP Questionnaire - 01/12/23 1450       Questionnaire   Ask client: Do you give verbal consent for me to treat you today? Yes    Student Assistance N/A    Location Patient [place]    Visit Setting with Client Organization    Patient Status Unknown   Has apartment at [facility] Medicare;Medicaid    Insurance/Financial Assistance Referral N/A    Medication N/A    Medical Provider Yes    Screening Referrals Made N/A    Medical Referrals Made N/A    Medical Appointment Made N/A    Recently w/o PCP, now 1st time PCP visit completed due to CNs referral or appointment made N/A    Food N/A    Transportation Need transportation assistance    Housing/Utilities N/A    Interpersonal Safety N/A    Interventions Counsel;Advocate/Support;Educate;Spiritual Care    Abnormal to Normal Screening Since Last CN Visit N/A    Screenings CN Performed Blood Pressure;Weight;Pulse Ox    Sent Client to Lab for: N/A    Did client attend any of the following based off CNs referral or appointments made? N/A    ED Visit Averted N/A    Life-Saving Intervention Made N/A      Questionnaire   Do you give verbal consent to treat you today? Yes    Location Patient Served  Not  Applicable    Visit Setting Church or Organization    Patient Status Unknown    Insurance Medicare    Insurance Referral N/A    Medication N/A    Screening Referrals N/A    Intervention Blood pressure;Blood glucose;Counsel;Educate

## 2023-01-13 ENCOUNTER — Other Ambulatory Visit: Payer: Self-pay

## 2023-01-14 ENCOUNTER — Ambulatory Visit: Payer: Medicare HMO | Attending: Critical Care Medicine | Admitting: Critical Care Medicine

## 2023-01-14 ENCOUNTER — Other Ambulatory Visit: Payer: Self-pay

## 2023-01-14 ENCOUNTER — Encounter: Payer: Self-pay | Admitting: Critical Care Medicine

## 2023-01-14 VITALS — BP 118/81 | HR 81 | Wt 264.4 lb

## 2023-01-14 DIAGNOSIS — N1832 Chronic kidney disease, stage 3b: Secondary | ICD-10-CM | POA: Insufficient documentation

## 2023-01-14 DIAGNOSIS — E1165 Type 2 diabetes mellitus with hyperglycemia: Secondary | ICD-10-CM | POA: Diagnosis not present

## 2023-01-14 DIAGNOSIS — F17210 Nicotine dependence, cigarettes, uncomplicated: Secondary | ICD-10-CM | POA: Diagnosis not present

## 2023-01-14 DIAGNOSIS — Z794 Long term (current) use of insulin: Secondary | ICD-10-CM | POA: Diagnosis not present

## 2023-01-14 DIAGNOSIS — Z720 Tobacco use: Secondary | ICD-10-CM

## 2023-01-14 DIAGNOSIS — N1831 Chronic kidney disease, stage 3a: Secondary | ICD-10-CM

## 2023-01-14 DIAGNOSIS — Z716 Tobacco abuse counseling: Secondary | ICD-10-CM | POA: Diagnosis not present

## 2023-01-14 DIAGNOSIS — I69351 Hemiplegia and hemiparesis following cerebral infarction affecting right dominant side: Secondary | ICD-10-CM | POA: Diagnosis not present

## 2023-01-14 DIAGNOSIS — I7143 Infrarenal abdominal aortic aneurysm, without rupture: Secondary | ICD-10-CM

## 2023-01-14 DIAGNOSIS — J439 Emphysema, unspecified: Secondary | ICD-10-CM | POA: Insufficient documentation

## 2023-01-14 DIAGNOSIS — I6930 Unspecified sequelae of cerebral infarction: Secondary | ICD-10-CM

## 2023-01-14 DIAGNOSIS — I129 Hypertensive chronic kidney disease with stage 1 through stage 4 chronic kidney disease, or unspecified chronic kidney disease: Secondary | ICD-10-CM | POA: Diagnosis not present

## 2023-01-14 DIAGNOSIS — Z09 Encounter for follow-up examination after completed treatment for conditions other than malignant neoplasm: Secondary | ICD-10-CM | POA: Diagnosis not present

## 2023-01-14 DIAGNOSIS — I714 Abdominal aortic aneurysm, without rupture, unspecified: Secondary | ICD-10-CM | POA: Diagnosis not present

## 2023-01-14 DIAGNOSIS — E1142 Type 2 diabetes mellitus with diabetic polyneuropathy: Secondary | ICD-10-CM | POA: Insufficient documentation

## 2023-01-14 DIAGNOSIS — I10 Essential (primary) hypertension: Secondary | ICD-10-CM

## 2023-01-14 DIAGNOSIS — E1122 Type 2 diabetes mellitus with diabetic chronic kidney disease: Secondary | ICD-10-CM | POA: Diagnosis not present

## 2023-01-14 DIAGNOSIS — J432 Centrilobular emphysema: Secondary | ICD-10-CM | POA: Diagnosis not present

## 2023-01-14 MED ORDER — TRESIBA FLEXTOUCH 200 UNIT/ML ~~LOC~~ SOPN
72.0000 [IU] | PEN_INJECTOR | Freq: Every day | SUBCUTANEOUS | 3 refills | Status: DC
  Filled 2023-01-14: qty 30, 84d supply, fill #0
  Filled 2023-01-18: qty 27, 75d supply, fill #0

## 2023-01-14 NOTE — Assessment & Plan Note (Addendum)
Care per endocrinology and obtain urine for microalbumin

## 2023-01-14 NOTE — Patient Instructions (Addendum)
No change in medications  Refill on tresiba sent downstairs Urine sample today Return 4 months

## 2023-01-14 NOTE — Assessment & Plan Note (Addendum)
Stable off therapy still smoking

## 2023-01-14 NOTE — Assessment & Plan Note (Signed)
Continue aspirin and statins ?

## 2023-01-14 NOTE — Assessment & Plan Note (Signed)
Blood pressure under good control maintain current meds

## 2023-01-14 NOTE — Assessment & Plan Note (Signed)
  .   Current smoking consumption amount: Several cigarettes a day  . Dicsussion on advise to quit smoking and smoking impacts: Cardiovascular impact  . Patient's willingness to quit: Willing to quit  . Methods to quit smoking discussed: Nicotine replacement  . Medication management of smoking session drugs discussed: Nicotine replacement  . Resources provided:  AVS   . Setting quit date not established  . Follow-up arranged 4 months   Time spent counseling the patient: 5 minutes  

## 2023-01-14 NOTE — Assessment & Plan Note (Signed)
Monitor renal function care per endocrinology

## 2023-01-14 NOTE — Assessment & Plan Note (Signed)
Continue with statins and aspirin
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
## 2023-01-14 NOTE — Assessment & Plan Note (Signed)
Monitor

## 2023-01-14 NOTE — Assessment & Plan Note (Signed)
Continue gabapentin.

## 2023-01-15 LAB — MICROALBUMIN / CREATININE URINE RATIO
Creatinine, Urine: 59 mg/dL
Microalb/Creat Ratio: <5 mg/g creat (ref 0–29)
Microalbumin, Urine: <3 ug/mL

## 2023-01-18 ENCOUNTER — Other Ambulatory Visit: Payer: Self-pay

## 2023-01-19 ENCOUNTER — Other Ambulatory Visit: Payer: Self-pay

## 2023-01-19 ENCOUNTER — Ambulatory Visit: Payer: Self-pay | Admitting: *Deleted

## 2023-01-19 ENCOUNTER — Telehealth: Payer: Self-pay | Admitting: Critical Care Medicine

## 2023-01-19 NOTE — Congregational Nurse Program (Signed)
  Dept: [phone]   Congregational Nurse Program Note  Date of Encounter: 01/19/2023  Clinic visit to check blood pressure and review lab results from MD visit on 6/27.  Read note from PCP to him regarding urine test result that showed no indication of kidney disease from diabetes.  BP 118/84, pulse 90, O2 Sat 99%.  Weight 262 Lbs, reviewed meal plans for this week.  Blood glucose 110 this AM, 164 this afternoon PC lunch per [facility] continuous glucose monitor.  Past Medical History: Past Medical History:  Diagnosis Date   Diabetes mellitus without complication (HCC)    Hypertension    Stroke [facility])     Encounter Details:  CNP Questionnaire - 01/19/23 1500       Questionnaire   Ask client: Do you give verbal consent for me to treat you today? Yes    Student Assistance N/A    [place]    Visit Setting with Client Organization    Patient Status Unknown   Has apartment at [facility] Medicare;Medicaid    Insurance/Financial Assistance Referral N/A    Medication N/A    Medical Provider Yes    Screening Referrals Made N/A    Medical Referrals Made N/A    Medical Appointment Made N/A    Recently w/o PCP, now 1st time PCP visit completed due to CNs referral or appointment made N/A    Food N/A    Transportation Need transportation assistance    Housing/Utilities N/A    Interpersonal Safety N/A    Interventions Counsel;Advocate/Support;Educate;Reviewed Medications    Abnormal to Normal Screening Since Last CN Visit N/A    Screenings CN Performed Blood Pressure;Weight;Pulse Ox    Sent Client to Lab for: N/A    Did client attend any of the following based off CNs referral or appointments made? N/A    ED Visit Averted N/A    Life-Saving Intervention Made N/A      Questionnaire   Do you give verbal consent to treat you today? Yes    Location Patient Served  Not Applicable    Visit Setting Church or Organization     Patient Status Unknown    Insurance Medicare    Insurance Referral N/A    Medication N/A    Screening Referrals N/A    Intervention Blood pressure;Blood glucose;Counsel;Educate

## 2023-01-19 NOTE — Telephone Encounter (Signed)
Melody, ncm called for pt , asking if Dr Delford Field will prescribe him something to help with smoking. Please call back

## 2023-01-19 NOTE — Telephone Encounter (Signed)
Summary: med ?   Melody NCM called in for pt. Pt wants to know if he is still supposed to be taking Atorvastatin, hasn't taken it since February. Please cb pt.           Chief Complaint: medication question Symptoms: na  Frequency: na  Pertinent Negatives: Patient denies na  Disposition: [] ED /[] Urgent Care (no appt availability in office) / [] Appointment(In office/virtual)/ []  [place] [facility]/ [x] Home Care/ [] Refused Recommended Disposition /[] [place] [facility]/ []  Follow-up with PCP Additional Notes:   Melody from N[facility] called to clarify if patient is to take atorvastatin. Called patient back and confirmed lipitor 10 mg is on current medication list to take daily . Patient reports he is taking lipitor 10 mg daily and is in his medication box.      Reason for Disposition  [1] Follow-up call to recent contact AND [2] information only call, no triage required  Answer Assessment - Initial Assessment Questions 1. REASON FOR CALL or QUESTION: "What is your reason for calling today?" or "How can I best help you?" or "What question do you have that I can help answer?"     Melody from NCM called to confirm patient is to be taking lipitor  Protocols used: Information Only Call - No Triage-A-AH

## 2023-01-19 NOTE — Progress Notes (Signed)
Let pt know no protein in urine  no kidney disease from DM

## 2023-01-20 ENCOUNTER — Telehealth: Payer: Self-pay

## 2023-01-20 MED ORDER — NICOTINE 21 MG/24HR TD PT24: 21.0000 mg | MEDICATED_PATCH | Freq: Every day | TRANSDERMAL | 0 refills | Status: DC

## 2023-01-20 NOTE — Telephone Encounter (Signed)
Called patient and she is aware

## 2023-01-20 NOTE — Telephone Encounter (Signed)
A prescription for nicotine patch was sent to his mail order pharmacy

## 2023-01-20 NOTE — Telephone Encounter (Signed)
-----   Message from Storm Frisk, MD sent at 01/19/2023  5:12 AM EDT ----- Let pt know no protein in urine  no kidney disease from DM

## 2023-01-20 NOTE — Telephone Encounter (Signed)
Pt was called and vm was left, Information has been sent to nurse pool.   

## 2023-01-26 NOTE — Congregational Nurse Program (Signed)
  Dept: [phone]   Congregational Nurse Program Note  Date of Encounter: 01/26/2023  Clinic visit to check blood pressure and weight, BP 119/80, pulse 88 and regular, O2 St 95%.  Weight 265 lbs, blood glucose 131 this AM, 170 PC lunch at 1330 per [facility] continuous glucose monitor.  Discussed staying hydrated during hot weather and continuing to eat foods high in fiber such as green vegetables.  Past Medical History: Past Medical History:  Diagnosis Date   Diabetes mellitus without complication (HCC)    Hypertension    Stroke [facility])     Encounter Details:  CNP Questionnaire - 01/26/23 1447       Questionnaire   Ask client: Do you give verbal consent for me to treat you today? Yes    Student Assistance N/A    Location Patient [place]    Visit Setting with Client Organization    Patient Status Unknown   Has apartment at [facility] Medicare;Medicaid    Insurance/Financial Assistance Referral N/A    Medication N/A    Medical Provider Yes    Screening Referrals Made N/A    Medical Referrals Made N/A    Medical Appointment Made N/A    Recently w/o PCP, now 1st time PCP visit completed due to CNs referral or appointment made N/A    Food N/A    Transportation Need transportation assistance    Housing/Utilities N/A    Interpersonal Safety N/A    Interventions Counsel;Advocate/Support;Educate;Spiritual Care    Abnormal to Normal Screening Since Last CN Visit N/A    Screenings CN Performed Blood Pressure;Weight;Pulse Ox    Sent Client to Lab for: N/A    Did client attend any of the following based off CNs referral or appointments made? N/A    ED Visit Averted N/A    Life-Saving Intervention Made N/A      Questionnaire   Do you give verbal consent to treat you today? Yes    Location Patient Served  Not Applicable    Visit Setting Church or Organization    Patient Status Unknown    Insurance Medicare    Insurance Referral N/A     Medication N/A    Screening Referrals N/A    Intervention Blood pressure;Blood glucose;Counsel;Educate

## 2023-02-02 NOTE — Congregational Nurse Program (Signed)
  Dept: [phone]   Congregational Nurse Program Note  Date of Encounter: 02/02/2023  Clinic visit to check blood pressure and weight, BP 119/74, pulse 88 and regular, O2 Sat 95%, weight 265 Lbs. States blood glucose 120 this AM, 166 pc lunch per [facility] continuous glucose monitor.  Discussed needed calories to continue weight loss and the need to concentrate on vegetables and fruits, limit high sodium foods and sweets.   Past Medical History: Past Medical History:  Diagnosis Date   Diabetes mellitus without complication (HCC)    Hypertension    Stroke [facility])     Encounter Details:

## 2023-02-09 DIAGNOSIS — H2513 Age-related nuclear cataract, bilateral: Secondary | ICD-10-CM | POA: Diagnosis not present

## 2023-02-09 DIAGNOSIS — E119 Type 2 diabetes mellitus without complications: Secondary | ICD-10-CM | POA: Diagnosis not present

## 2023-02-09 DIAGNOSIS — H5203 Hypermetropia, bilateral: Secondary | ICD-10-CM | POA: Diagnosis not present

## 2023-02-09 DIAGNOSIS — H35371 Puckering of macula, right eye: Secondary | ICD-10-CM | POA: Diagnosis not present

## 2023-02-09 LAB — HM DIABETES EYE EXAM

## 2023-02-15 ENCOUNTER — Telehealth: Payer: Self-pay

## 2023-02-15 NOTE — Telephone Encounter (Signed)
Error

## 2023-02-16 NOTE — Congregational Nurse Program (Signed)
  Dept: [phone]   Congregational Nurse Program Note  Date of Encounter: 02/16/2023  Clinic visit to check blood pressure and weight, BP 111/79, pulse 93, O2 Sat 95%, weight 262 lbs.  Blood glucose PC lunch 107 per [facility] continuous glucose monitor.  Educated regarding normal glucose levels and his body adjusting to lower more normal levels.  Reviewed symptoms of hypoglycemia and appropriate treatment. Past Medical History: Past Medical History:  Diagnosis Date   Diabetes mellitus without complication (HCC)    Hypertension    Stroke [facility])     Encounter Details:  CNP Questionnaire - 02/16/23 1440       Questionnaire   Ask client: Do you give verbal consent for me to treat you today? Yes    Student Assistance N/A    Location Patient [place]    Visit Setting with Client Organization    Patient Status Unknown   Has apartment at [facility] Medicare;Medicaid    Insurance/Financial Assistance Referral N/A    Medication N/A    Medical Provider Yes    Screening Referrals Made N/A    Medical Referrals Made N/A    Medical Appointment Made N/A    Recently w/o PCP, now 1st time PCP visit completed due to CNs referral or appointment made N/A    Food N/A    Transportation Need transportation assistance    Housing/Utilities N/A    Interpersonal Safety N/A    Interventions Counsel;Advocate/Support;Educate;Spiritual Care    Abnormal to Normal Screening Since Last CN Visit N/A    Screenings CN Performed Blood Pressure;Weight;Pulse Ox    Sent Client to Lab for: N/A    Did client attend any of the following based off CNs referral or appointments made? N/A    ED Visit Averted N/A    Life-Saving Intervention Made N/A      Questionnaire   Do you give verbal consent to treat you today? Yes    Location Patient Served  Not Applicable    Visit Setting Church or Organization    Patient Status Unknown    Insurance Medicare    Insurance  Referral N/A    Medication N/A    Screening Referrals N/A    Intervention Blood pressure;Blood glucose;Counsel;Educate

## 2023-02-22 ENCOUNTER — Other Ambulatory Visit: Payer: Self-pay | Admitting: Critical Care Medicine

## 2023-02-22 NOTE — Telephone Encounter (Signed)
Medication Refill - Medication:  gabapentin (NEURONTIN) 100 MG capsule    Has the patient contacted their pharmacy? No. He wants at a different pharmacy (Agent: If yes, when and what did the pharmacy advise?)  Preferred Pharmacy (with phone number or street name): [facility]  Has the patient been seen for an appointment in the last year OR does the patient have an upcoming appointment? Yes.    Agent: Please be advised that RX refills may take up to 3 business days. We ask that you follow-up with your pharmacy.

## 2023-02-23 ENCOUNTER — Telehealth: Payer: Self-pay

## 2023-02-23 ENCOUNTER — Telehealth: Payer: Self-pay | Admitting: Critical Care Medicine

## 2023-02-23 ENCOUNTER — Other Ambulatory Visit: Payer: Self-pay

## 2023-02-23 NOTE — Telephone Encounter (Signed)
Copied from CRM [id number]. Topic: General - Inquiry >> Feb 23, 2023  9:12 AM De Blanch wrote: Reason for CRM: Pt is requesting a callback from Robyne Peers. I asked if I could help him with something.  Pt declined to provide any details and requested a callback from Ms. Erskine Squibb. Please advise.

## 2023-02-23 NOTE — Telephone Encounter (Signed)
duplicate

## 2023-02-23 NOTE — Telephone Encounter (Signed)
Unable to refill per protocol, Rx request is too soon. Last refill 11/16/22 for 90 and 2 refills.  Requested Prescriptions  Pending Prescriptions Disp Refills   gabapentin (NEURONTIN) 100 MG capsule 180 capsule 2    Sig: Take 1 capsule (100 mg total) by mouth 2 (two) times daily.     Neurology: Anticonvulsants - gabapentin Failed - 02/22/2023 10:43 AM      Failed - Cr in normal range and within 360 days    Creatinine, Ser  Date Value Ref Range Status  10/22/2022 1.70 (H) 0.76 - 1.27 mg/dL Final         Passed - Completed PHQ-2 or PHQ-9 in the last 360 days      Passed - Valid encounter within last 12 months    Recent Outpatient Visits           1 month ago Uncontrolled type 2 diabetes mellitus with hyperglycemia [facility])   [facility] Storm Frisk, MD   4 months ago Uncontrolled type 2 diabetes mellitus with hyperglycemia [facility])   [facility] Storm Frisk, MD   8 months ago Uncontrolled type 2 diabetes mellitus with hyperglycemia [facility])   [place] [facility] Storm Frisk, MD   10 months ago Uncontrolled type 2 diabetes mellitus with hyperglycemia [facility])   [facility] Storm Frisk, MD   11 months ago Impaired mobility and activities of daily living   [facility] Storm Frisk, MD       Future Appointments             In 3 months Delford Field Charlcie Cradle, MD [facility]

## 2023-02-23 NOTE — Telephone Encounter (Signed)
Copied from CRM [id number]. Topic: General - Other >> Feb 23, 2023  2:01 PM Reginald Rodriguez wrote: Reason for CRM: The patient has called requesting contact with J. Brazeau  Please contact further when possible

## 2023-02-23 NOTE — Telephone Encounter (Signed)
Dr Delford Field- I called the patient and he is requesting a refill of gabapentin. He said he called and spoke to someone yesterday but the rx was never placed.  He would like the prescription sent to [facility] at [facility].

## 2023-02-24 ENCOUNTER — Other Ambulatory Visit: Payer: Self-pay

## 2023-02-24 MED ORDER — GABAPENTIN 100 MG PO CAPS
100.0000 mg | ORAL_CAPSULE | Freq: Two times a day (BID) | ORAL | 2 refills | Status: DC
  Filled 2023-02-24: qty 180, 90d supply, fill #0

## 2023-02-24 NOTE — Telephone Encounter (Signed)
Called patient and is aware and has already picked medication up

## 2023-02-24 NOTE — Telephone Encounter (Signed)
Gabapentin Rx sent

## 2023-02-24 NOTE — Addendum Note (Signed)
Addended by: Shan Levans E on: 02/24/2023 06:01 AM   Modules accepted: Orders

## 2023-02-25 ENCOUNTER — Ambulatory Visit: Payer: Medicare HMO | Admitting: Critical Care Medicine

## 2023-03-02 ENCOUNTER — Telehealth: Payer: Self-pay

## 2023-03-02 LAB — BASIC METABOLIC PANEL: Glucose: 128

## 2023-03-02 NOTE — Congregational Nurse Program (Signed)
  Dept: [phone]   Congregational Nurse Program Note  Date of Encounter: 03/02/2023  Clinic visit for complaint of waking up at night from continuous glucose monitor alarm for low blood sugar.  Freestyle Libre blood glucose levels during night when alarms between 60 and 70, afternoon levels PC lunch or before dinner are 120's and 130's.  Fasting AM levels in the 80's, was 87 this AM.  Fingerstick blood glucose at 2:27 PM approximately 1.75 hours after lunch was 128.  BP 115/78, pulse 83 and regular, O2 Sat 98%.  Weight between 260 and 265 lbs past few months.  Denies headache, dizziness, sweating or blurred vision when blood glucose in 60's but does state he eats Tootsie Roll candies to prevent glucose for further dropping. PCP notified of lower glucose levels.  Educated regarding eating carbohydrates but to not have large amounts of sweets or candies when glucose level is low. Past Medical History: Past Medical History:  Diagnosis Date   Diabetes mellitus without complication (HCC)    Hypertension    Stroke [facility])     Encounter Details:  CNP Questionnaire - 03/02/23 1400       Questionnaire   Ask client: Do you give verbal consent for me to treat you today? Yes    Student Assistance N/A    Location Patient [place]    Visit Setting with Client Organization    Patient Status Unknown   Has apartment at [facility] Medicare;Medicaid    Insurance/Financial Assistance Referral N/A    Medication N/A    Medical Provider Yes    Screening Referrals Made N/A    Medical Referrals Made N/A    Medical Appointment Made N/A    Recently w/o PCP, now 1st time PCP visit completed due to CNs referral or appointment made N/A    Food N/A    Transportation Need transportation assistance    Housing/Utilities N/A    Interpersonal Safety N/A    Interventions Counsel;Advocate/Support;Educate;Case Management;Reviewed Medications    Abnormal to Normal Screening  Since Last CN Visit N/A    Screenings CN Performed Blood Pressure;Weight;Pulse Ox    Sent Client to Lab for: N/A    Did client attend any of the following based off CNs referral or appointments made? N/A    ED Visit Averted N/A    Life-Saving Intervention Made N/A      Questionnaire   Do you give verbal consent to treat you today? Yes    Location Patient Served  Not Applicable    Visit Setting Church or Organization    Patient Status Unknown    Insurance Medicare    Insurance Referral N/A    Medication N/A    Screening Referrals N/A    Intervention Blood pressure;Blood glucose;Counsel;Educate

## 2023-03-03 NOTE — Congregational Nurse Program (Signed)
  Dept: [phone]   Congregational Nurse Program Note  Date of Encounter: 03/02/2023  Sent email to resident after message from PCP office that he is to decrease evening insulin to 60 units beginning this evening.  Appointment at MD office on Thursday at 2:00 PM, made arrangements for transportation to MD appointment with [facility] case Production designer, theatre/television/film. Past Medical History: Past Medical History:  Diagnosis Date   Diabetes mellitus without complication (HCC)    Hypertension    Stroke [facility]     Encounter Details:  CNP Questionnaire - 03/02/23 1700       Questionnaire   Ask client: Do you give verbal consent for me to treat you today? Yes    Student Assistance N/A    Location Patient [facility]    Visit Setting with Client Phone/Text/Email    Patient Status Unknown   Has apartment at [facility] Medicare;Medicaid    Insurance/Financial Assistance Referral N/A    Medication N/A    Medical Provider Yes    Screening Referrals Made N/A    Medical Referrals Made [facility] PCP/[facility]inic    Medical Appointment Made [facility] PCP[facility]clinic    Recently w/o PCP, now 1st time PCP visit completed due to CNs referral or appointment made N/A    Food N/A    Transportation Need transportation assistance    Housing/Utilities N/A    Interpersonal Safety N/A    Interventions Case Management;Navigate Healthcare System    Abnormal to Normal Screening Since Last CN Visit N/A    Screenings CN Performed N/A    Sent Client to Lab for: N/A    Did client attend any of the following based off CNs referral or appointments made? N/A    ED Visit Averted N/A    Life-Saving Intervention Made N/A      Questionnaire   Do you give verbal consent to treat you today? Yes    Location Patient Served  Not Applicable    Visit Setting Church or Organization    Patient Status Unknown    Insurance Medicare    Insurance Referral N/A    Medication N/A    Screening Referrals N/A     Intervention Blood pressure;Blood glucose;Counsel;Educate

## 2023-03-04 ENCOUNTER — Other Ambulatory Visit: Payer: Self-pay

## 2023-03-04 ENCOUNTER — Encounter: Payer: Self-pay | Admitting: Pharmacist

## 2023-03-04 ENCOUNTER — Ambulatory Visit: Payer: Medicare HMO | Attending: Critical Care Medicine | Admitting: Pharmacist

## 2023-03-04 DIAGNOSIS — Z7984 Long term (current) use of oral hypoglycemic drugs: Secondary | ICD-10-CM | POA: Diagnosis not present

## 2023-03-04 DIAGNOSIS — Z125 Encounter for screening for malignant neoplasm of prostate: Secondary | ICD-10-CM

## 2023-03-04 DIAGNOSIS — E1165 Type 2 diabetes mellitus with hyperglycemia: Secondary | ICD-10-CM | POA: Diagnosis not present

## 2023-03-04 DIAGNOSIS — Z794 Long term (current) use of insulin: Secondary | ICD-10-CM | POA: Diagnosis not present

## 2023-03-04 LAB — POCT GLYCOSYLATED HEMOGLOBIN (HGB A1C): HbA1c, POC (controlled diabetic range): 7.5 % — AB (ref 0.0–7.0)

## 2023-03-04 MED ORDER — TRESIBA FLEXTOUCH 200 UNIT/ML ~~LOC~~ SOPN
68.0000 [IU] | PEN_INJECTOR | Freq: Every day | SUBCUTANEOUS | 3 refills | Status: DC
  Filled 2023-03-04: qty 45, fill #0
  Filled 2023-04-26: qty 27, 79d supply, fill #0

## 2023-03-04 NOTE — Progress Notes (Signed)
    S:    PCP: Dr. Delford Field PMH: HTN, DM, prior CVA, CKD-3, COPD, hx of Edm stress incontinence   Patient arrives in good spirits. Presents for diabetes evaluation, education, and management. Patient was referred and last seen by Primary Care Provider on 01/14/2023. He is also engaged with endo. Last saw Dr. Lonzo Cloud in April and has upcoming visit scheduled for October.   Today, patient endorses compliance his medications. He is able to verbalize his insulin dosages. A1c today is 7.5 (down from 7.9% in April). His main concerns today are nocturia and hypoglycemia. Brings his CGM with him today. Only 1 event noted over 30 days, however, he endorses hypoglycemia with readings in the 50s almost daily for the past 2 weeks. Additionally, he is getting up to use the restroom >5 times during the night. Denies any hx of BPH or family hx of BPH. His sugar values are summarized below.  Family/Social History:  -Fhx: no pertinent positives -Tobacco smoker, 5-6 cigs/day -Alcohol: none reported   Insurance coverage/medication affordability: [employer] Medicare  Medication adherence reported is appropriate.    Current diabetes medications include:  -Farxiga 10 mg daily -Pioglitazone 30 mg daily  -Tresiba 72 units daily - takes at 8-9pm -Novolog 32 units before breakfast (takes ~7am) and lunch (~3-4pm) and 24 units qPM (~7pm)  Patient-reported exercise habits: limited    Patient denies neuropathy (nerve pain). Patient denies visual changes. Patient reports self foot exams.    O:  CGM (Libre 2) in place 30 day avg: 126 mg/dL Patterns: Avg-ing low 846N down to 80s from 12a - 6a.  Lab Results  Component Value Date   HGBA1C 7.5 (A) 03/04/2023   There were no vitals filed for this visit.  Lipid Panel     Component Value Date/Time   CHOL 133 10/22/2022 1133   TRIG 180 (H) 10/22/2022 1133   HDL 31 (L) 10/22/2022 1133   CHOLHDL 4.3 10/22/2022 1133   LDLCALC 71 10/22/2022 1133   Clinical  Atherosclerotic Cardiovascular Disease (ASCVD): Yes  (hx of stroke) The ASCVD Risk score (Arnett DK, et al., 2019) failed to calculate for the following reasons:   The patient has a prior MI or stroke diagnosis   A/P:  Diabetes longstanding currently close to goal. A1c today looks good. He does have a bit of a complicated medical hx and is an older adult. I think a goal A1c <7.5% is appropriate for him. He endorses good medication adherence. Hypoglycemia reported but he treats well. We can try moving his Tresiba to AM and reducing dose to 68u daily. I have also advised him to skip his pre-supper bolus dose if pre-prandial sugar is < 150 mg/dL.  -Change Tresiba to 68 units daily in the morning. -Continue Novolog 32 units before breakfast and lunch as prescribed. Continue Novolog to 24 units before dinner. Hold pre-dinner dose if pre-prandial glucose is <150 mg/dL. -Continued Farxiga and pioglitazone.  -Extensively discussed pathophysiology of diabetes, recommended lifestyle interventions, dietary effects on blood sugar control -Counseled on s/sx of and management of hypoglycemia -Next A1C anticipated 05/2023.  -PSA ordered given urinary symptoms and patient demographics.  Written patient instructions provided.  Total time in face to face counseling 20 minutes.   Follow up with endocrinology 04/2023.  Butch Penny, PharmD, Patsy [place], CPP Clinical Pharmacist [facility] [phone]

## 2023-03-05 ENCOUNTER — Telehealth: Payer: Self-pay

## 2023-03-05 LAB — PSA: Prostate Specific Ag, Serum: 2.5 ng/mL (ref 0.0–4.0)

## 2023-03-05 NOTE — Telephone Encounter (Signed)
Pt was called and is aware of results, DOB was confirmed.  ?

## 2023-03-05 NOTE — Telephone Encounter (Signed)
-----   Message from Shan Levans sent at 03/05/2023  6:26 AM EDT ----- Let pt know A1C is improved at 7.5  Prostate test normal no prostate cancer

## 2023-03-05 NOTE — Progress Notes (Signed)
Let pt know A1C is improved at 7.5  Prostate test normal no prostate cancer

## 2023-03-09 NOTE — Congregational Nurse Program (Signed)
  Dept: [phone]   Congregational Nurse Program Note  Date of Encounter: 03/09/2023  Clinic visit to check blood pressure and discuss insulin dosage change from MD office visit on the 15th.  BP 130/82, pulse 90 and regular, weight 265 Lbs.  Blood glucose was 114 pee Freestyle Libre continuous glucose monitor AC lunch. Resident voiced concern that taking 24 units of Novolog insulin would drop his blood glucose too low.  Reviewed the MD the note and orders, educated him regarding order that afternoon insulin to be taken only when blood glucose 150 or higher.  Discussed A1c of 7.5 is target level for his age per the MD note from the visit. Past Medical History: Past Medical History:  Diagnosis Date   Diabetes mellitus without complication (HCC)    Hypertension    Stroke [facility])     Encounter Details:  CNP Questionnaire - 03/09/23 1415       Questionnaire   Ask client: Do you give verbal consent for me to treat you today? Yes    Student Assistance N/A    Location Patient [place]    Visit Setting with Client Organization    Patient Status Unknown   Has apartment at [facility] Medicare;Medicaid    Insurance/Financial Assistance Referral N/A    Medication N/A    Medical Provider Yes    Screening Referrals Made N/A    Medical Referrals Made N/A    Medical Appointment Made N/A    Recently w/o PCP, now 1st time PCP visit completed due to CNs referral or appointment made N/A    Food N/A    Transportation Need transportation assistance    Housing/Utilities N/A    Interpersonal Safety N/A    Interventions Counsel;Advocate/Support;Educate;Case Management;Reviewed Medications;Spiritual Care    Abnormal to Normal Screening Since Last CN Visit N/A    Screenings CN Performed Blood Pressure;Weight;Pulse Ox    Sent Client to Lab for: N/A    Did client attend any of the following based off CNs referral or appointments made? N/A    ED Visit Averted  N/A    Life-Saving Intervention Made N/A      Questionnaire   Do you give verbal consent to treat you today? Yes    Location Patient Served  Not Applicable    Visit Setting Church or Organization    Patient Status Unknown    Insurance Medicare    Insurance Referral N/A    Medication N/A    Screening Referrals N/A    Intervention Blood pressure;Blood glucose;Counsel;Educate

## 2023-03-09 NOTE — Congregational Nurse Program (Signed)
  Dept: [phone]   Congregational Nurse Program Note  Date of Encounter: 03/09/2023  Clinic visit to check blood pressure Past Medical History: Past Medical History:  Diagnosis Date   Diabetes mellitus without complication (HCC)    Hypertension    Stroke [facility])     Encounter Details:  CNP Questionnaire - 03/09/23 1415       Questionnaire   Ask client: Do you give verbal consent for me to treat you today? Yes    Student Assistance N/A    Location [place]    Visit Setting with Client Organization    Patient Status Unknown   Has apartment at [facility] Medicare;Medicaid    Insurance/Financial Assistance Referral N/A    Medication N/A    Medical Provider Yes    Screening Referrals Made N/A    Medical Referrals Made N/A    Medical Appointment Made N/A    Recently w/o PCP, now 1st time PCP visit completed due to CNs referral or appointment made N/A    Food N/A    Transportation Need transportation assistance    Housing/Utilities N/A    Interpersonal Safety N/A    Interventions Counsel;Advocate/Support;Educate;Case Management;Reviewed Medications;Spiritual Care    Abnormal to Normal Screening Since Last CN Visit N/A    Screenings CN Performed Blood Pressure;Weight;Pulse Ox    Sent Client to Lab for: N/A    Did client attend any of the following based off CNs referral or appointments made? N/A    ED Visit Averted N/A    Life-Saving Intervention Made N/A      Questionnaire   Do you give verbal consent to treat you today? Yes    Location Patient Served  Not Applicable    Visit Setting Church or Organization    Patient Status Unknown    Insurance Medicare    Insurance Referral N/A    Medication N/A    Screening Referrals N/A    Intervention Blood pressure;Blood glucose;Counsel;Educate

## 2023-03-16 NOTE — Congregational Nurse Program (Signed)
  Dept: [phone]   Congregational Nurse Program Note  Date of Encounter: 03/16/2023  Clinic visit for episodes off low blood glucose in the afternoon when take 32 units of Novolog insulin when blood glucose is in low 100's.  Today blood glucose 142 per CGM approximately 30 minutes after drinking orange juice. Sent staff message to case manager at PCP office to determine when to hold Novolog.  Past Medical History: Past Medical History:  Diagnosis Date   Diabetes mellitus without complication (HCC)    Hypertension    Stroke [facility])     Encounter Details:  CNP Questionnaire - 03/16/23 1255       Questionnaire   Ask client: Do you give verbal consent for me to treat you today? Yes    Student Assistance N/A    Location Patient [place]    Visit Setting with Client Organization    Patient Status Unknown   Has apartment at [facility] Medicare;Medicaid    Insurance/Financial Assistance Referral N/A    Medication N/A    Medical Provider Yes    Screening Referrals Made N/A    Medical Referrals Made [facility] PCP/Clinic    Medical Appointment Made N/A    Recently w/o PCP, now 1st time PCP visit completed due to CNs referral or appointment made N/A    Food N/A    Transportation Need transportation assistance    Housing/Utilities N/A    Interpersonal Safety N/A    Interventions Counsel;Advocate/Support;Educate;Case Management;Reviewed Medications    Abnormal to Normal Screening Since Last CN Visit N/A    Screenings CN Performed Blood Pressure;Weight;Pulse Ox    Sent Client to Lab for: N/A    Did client attend any of the following based off CNs referral or appointments made? N/A    ED Visit Averted Yes    Life-Saving Intervention Made N/A      Questionnaire   Do you give verbal consent to treat you today? Yes    Location Patient Served  Not Applicable    Visit Setting Church or Organization    Patient Status Unknown    Insurance Medicare     Insurance Referral N/A    Medication N/A    Screening Referrals N/A    Intervention Blood pressure;Blood glucose;Counsel;Educate

## 2023-03-17 ENCOUNTER — Emergency Department (HOSPITAL_COMMUNITY)
Admission: EM | Admit: 2023-03-17 | Discharge: 2023-03-18 | Discharge status: Against Medical Advice | Payer: Medicare HMO | Attending: Emergency Medicine | Admitting: Emergency Medicine

## 2023-03-17 ENCOUNTER — Other Ambulatory Visit: Payer: Self-pay

## 2023-03-17 ENCOUNTER — Encounter (HOSPITAL_COMMUNITY): Payer: Self-pay

## 2023-03-17 DIAGNOSIS — E11649 Type 2 diabetes mellitus with hypoglycemia without coma: Secondary | ICD-10-CM | POA: Diagnosis not present

## 2023-03-17 DIAGNOSIS — R7309 Other abnormal glucose: Secondary | ICD-10-CM | POA: Insufficient documentation

## 2023-03-17 DIAGNOSIS — E161 Other hypoglycemia: Secondary | ICD-10-CM | POA: Diagnosis not present

## 2023-03-17 DIAGNOSIS — Z5321 Procedure and treatment not carried out due to patient leaving prior to being seen by health care provider: Secondary | ICD-10-CM | POA: Insufficient documentation

## 2023-03-17 DIAGNOSIS — R42 Dizziness and giddiness: Secondary | ICD-10-CM | POA: Diagnosis not present

## 2023-03-17 LAB — COMPREHENSIVE METABOLIC PANEL
ALT: 24 U/L (ref 0–44)
AST: 26 U/L (ref 15–41)
Albumin: 3.9 g/dL (ref 3.5–5.0)
Alkaline Phosphatase: 44 U/L (ref 38–126)
Anion gap: 13 (ref 5–15)
BUN: 29 mg/dL — ABNORMAL HIGH (ref 8–23)
CO2: 22 mmol/L (ref 22–32)
Calcium: 9.7 mg/dL (ref 8.9–10.3)
Chloride: 103 mmol/L (ref 98–111)
Creatinine, Ser: 1.71 mg/dL — ABNORMAL HIGH (ref 0.61–1.24)
GFR, Estimated: 43 mL/min — ABNORMAL LOW (ref >60)
Glucose, Bld: 95 mg/dL (ref 70–99)
Potassium: 3.3 mmol/L — ABNORMAL LOW (ref 3.5–5.1)
Sodium: 138 mmol/L (ref 135–145)
Total Bilirubin: 0.2 mg/dL — ABNORMAL LOW (ref 0.3–1.2)
Total Protein: 7 g/dL (ref 6.5–8.1)

## 2023-03-17 LAB — CBC WITH DIFFERENTIAL/PLATELET
Abs Immature Granulocytes: 0.02 K/uL (ref 0.00–0.07)
Basophils Absolute: 0.1 K/uL (ref 0.0–0.1)
Basophils Relative: 1 %
Eosinophils Absolute: 0.4 K/uL (ref 0.0–0.5)
Eosinophils Relative: 4 %
HCT: 42.9 % (ref 39.0–52.0)
Hemoglobin: 13.9 g/dL (ref 13.0–17.0)
Immature Granulocytes: 0 %
Lymphocytes Relative: 42 %
Lymphs Abs: 3.4 K/uL (ref 0.7–4.0)
MCH: 29.4 pg (ref 26.0–34.0)
MCHC: 32.4 g/dL (ref 30.0–36.0)
MCV: 90.7 fL (ref 80.0–100.0)
Monocytes Absolute: 0.9 K/uL (ref 0.1–1.0)
Monocytes Relative: 10 %
Neutro Abs: 3.5 K/uL (ref 1.7–7.7)
Neutrophils Relative %: 43 %
Platelets: 258 K/uL (ref 150–400)
RBC: 4.73 MIL/uL (ref 4.22–5.81)
RDW: 16 % — ABNORMAL HIGH (ref 11.5–15.5)
WBC: 8.2 K/uL (ref 4.0–10.5)
nRBC: 0 % (ref 0.0–0.2)

## 2023-03-17 LAB — CBG MONITORING, ED: Glucose-Capillary: 106 mg/dL — ABNORMAL HIGH (ref 70–99)

## 2023-03-17 NOTE — ED Notes (Signed)
Called pt 3x to update vitals- no answer.

## 2023-03-17 NOTE — ED Notes (Signed)
Oscar,PA at bedside.

## 2023-03-17 NOTE — ED Triage Notes (Signed)
PER EMS: pt has an implanted blood glucose monitor in his left arm. He has been having fluctuations in the readings of his blood sugar levels and is here today to see why his blood sugar levels are going up and down. He reports he feels good right now, denies nausea or vomiting.   BP- 111/81, HR- 92, O2- 96% RA, CBG-116

## 2023-03-17 NOTE — ED Provider Triage Note (Signed)
Emergency Medicine Provider Triage Evaluation Note  Reginald Rodriguez , a 72 y.o. male  was evaluated in triage.  Pt complains of blood sugar concerns.  Patient is reporting that he has been having episodes multiple times a day over the last week or so where his blood sugar has been dropping in the low range of normal around 50s.  Does report that he is on insulin for blood sugar control but denies any recent increase in long-acting dose. Uses [place] and Novolog with Evaristo Bury recently decreased from 72 to 68 units due to concerns of hypoglycemia. Not currently hypoglycemic and has CBG reading close to identical without POC glucose.  Review of Systems  Positive: As above Negative: As above  Physical Exam  BP (!) 162/128 (BP Location: Right Arm)   Pulse (!) 101   Temp 98.3 F (36.8 C) (Oral)   Resp 20   Ht 5\' 7"  (1.702 m)   Wt 117.9 kg   SpO2 96%   BMI 40.72 kg/m  Gen:   Awake, no distress   Resp:  Normal effort  MSK:   Moves extremities without difficulty  Other:    Medical Decision Making  Medically screening exam initiated at 3:46 PM.  Appropriate orders placed.  [name] was informed that the remainder of the evaluation will be completed by another provider, this initial triage assessment does not replace that evaluation, and the importance of remaining in the ED until their evaluation is complete.     Smitty Knudsen, PA-C 03/17/23 [phone]

## 2023-03-23 NOTE — Congregational Nurse Program (Signed)
  Dept: [phone]   Congregational Nurse Program Note  Date of Encounter: 03/23/2023  Clinic visit to check weight and blood pressure, BP 113/85, pulse 84 and regular.  Weight 268 lbs, blood glucose 91 per Freestyle Libre CGM.  Went to ER last week Wednesday afternoon for a low blood glucose level of 53, had glucose level of 95 by the time checked at the hospital.  Stated he has followed new order to not take 32 Units of Novolog if lunch time glucose level is below 150 since that day.  Discussed managing blood glucose levels to primarily stay within range without any further lows.  Also reinforced limiting cigarettes, currently smoking about half pack per day.  Discussed complications of diabetes and heart disease worsened by smoking. Past Medical History: Past Medical History:  Diagnosis Date   Diabetes mellitus without complication (HCC)    Hypertension    Stroke [facility])     Encounter Details:  CNP Questionnaire - 03/23/23 1450       Questionnaire   Ask client: Do you give verbal consent for me to treat you today? Yes    Student Assistance N/A    Location Patient [place]    Visit Setting with Client Organization    Patient Status Unknown   Has apartment at [facility] Medicare;Medicaid    Insurance/Financial Assistance Referral N/A    Medication N/A    Medical Provider Yes    Screening Referrals Made N/A    Medical Referrals Made N/A    Medical Appointment Made N/A    Recently w/o PCP, now 1st time PCP visit completed due to CNs referral or appointment made N/A    Food N/A    Transportation Need transportation assistance    Housing/Utilities N/A    Interpersonal Safety N/A    Interventions Counsel;Advocate/Support;Educate;Case Management;Reviewed Medications    Abnormal to Normal Screening Since Last CN Visit Blood Glucose    Screenings CN Performed Blood Pressure;Weight;Pulse Ox    Sent Client to Lab for: N/A    Did client attend  any of the following based off CNs referral or appointments made? N/A    ED Visit Averted N/A    Life-Saving Intervention Made N/A      Questionnaire   Do you give verbal consent to treat you today? Yes    Location Patient Served  Not Applicable    Visit Setting Church or Organization    Patient Status Unknown    Insurance Medicare    Insurance Referral N/A    Medication N/A    Screening Referrals N/A    Intervention Blood pressure;Blood glucose;Counsel;Educate

## 2023-03-25 ENCOUNTER — Other Ambulatory Visit: Payer: Self-pay | Admitting: Critical Care Medicine

## 2023-03-30 LAB — BASIC METABOLIC PANEL: Glucose: 140

## 2023-03-30 NOTE — Congregational Nurse Program (Unsigned)
  Dept: [phone]   Congregational Nurse Program Note  Date of Encounter: 03/30/2023  Past Medical History: Past Medical History:  Diagnosis Date   Diabetes mellitus without complication (HCC)    Hypertension    Stroke [facility])     Encounter Details:  CNP Questionnaire - 03/30/23 1650       Questionnaire   Ask client: Do you give verbal consent for me to treat you today? Yes    Student Assistance N/A    Location Patient [place]    Visit Setting with Client Organization    Patient Status Unknown   Has apartment at [facility] Medicare;Medicaid    Insurance/Financial Assistance Referral N/A    Medication N/A    Medical Provider Yes    Screening Referrals Made N/A    Medical Referrals Made [facility] PCP/Clinic;Dental    Medical Appointment Made N/A    Recently w/o PCP, now 1st time PCP visit completed due to CNs referral or appointment made N/A    Food N/A    Transportation Need transportation assistance    Housing/Utilities N/A    Interpersonal Safety N/A    Interventions Counsel;Advocate/Support;Educate;Case Management;Reviewed Medications    Abnormal to Normal Screening Since Last CN Visit N/A    Screenings CN Performed Blood Pressure;Weight;Blood Glucose    Sent Client to Lab for: N/A    Did client attend any of the following based off CNs referral or appointments made? N/A    ED Visit Averted N/A    Life-Saving Intervention Made N/A      Questionnaire   Do you give verbal consent to treat you today? Yes    Location Patient Served  Not Applicable    Visit Setting Church or Organization    Patient Status Unknown    Insurance Medicare    Insurance Referral N/A    Medication N/A    Screening Referrals N/A    Intervention Blood pressure;Blood glucose;Counsel;Educate               Dept: [phone]   Congregational Nurse Program Note  Date of Encounter: 03/30/2023  Clinic visit to check blood pressure and blood  glucose, BP 124/80, pulse 90 and regular. Weight is 267 Lbs, states blood glucose 90 this AM and highest AM level 110 in past week, generally in the 90's.  Has not taken Novolog afternoon dosage due to glucose levels all below 150, fingerstick blood glucose at time of clinic 140.  Concerned about use of right leg, wants to exercise to lose weight, sent staff message to case manager at PCP office regarding request.  Left message for [facility] regarding loose dentures. Past Medical History: Past Medical History:  Diagnosis Date   Diabetes mellitus without complication (HCC)    Hypertension    Stroke [facility])     Encounter Details:

## 2023-04-01 DIAGNOSIS — E1165 Type 2 diabetes mellitus with hyperglycemia: Secondary | ICD-10-CM | POA: Diagnosis not present

## 2023-04-06 ENCOUNTER — Telehealth: Payer: Self-pay

## 2023-04-06 DIAGNOSIS — Z789 Other specified health status: Secondary | ICD-10-CM

## 2023-04-06 NOTE — Congregational Nurse Program (Signed)
  Dept: [phone]   Congregational Nurse Program Note  Date of Encounter: 04/06/2023  Clinic visit to check blood pressure, review blood glucose levels and arrange dental appointment.  BP 105/76, pulse 90 and regular, O2 Sat 96%.  Blood glucose levels in the afternoon are all below 150, no afternoon insulin has been needed since order changed.  Reviewed meals and is eating vegetables twice daily and has deceased carbohydrate intake.  Called [facility] regarding loose dentures, appointment made for October 21 at 2:30P.  He will be called for an earlier appointment if there is a cancellation, transportation to be arranged by [employer] case Production designer, theatre/television/film. Past Medical History: Past Medical History:  Diagnosis Date   Diabetes mellitus without complication (HCC)    Hypertension    Stroke [facility])     Encounter Details:  CNP Questionnaire - 04/06/23 1600       Questionnaire   Ask client: Do you give verbal consent for me to treat you today? Yes    Student Assistance N/A    Location Patient [place]    Visit Setting with Client Organization    Patient Status Unknown   Has apartment at [facility] Medicare;Medicaid    Insurance/Financial Assistance Referral N/A    Medication N/A    Medical Provider Yes    Screening Referrals Made N/A    Medical Referrals Made Dental    Medical Appointment Made Dental    Recently w/o PCP, now 1st time PCP visit completed due to CNs referral or appointment made N/A    Food N/A    Transportation Need transportation assistance    Housing/Utilities N/A    Interpersonal Safety N/A    Interventions Counsel;Advocate/Support;Educate;Case Management;Navigate Healthcare System    Abnormal to Normal Screening Since Last CN Visit N/A    Screenings CN Performed Blood Pressure;Weight;Pulse Ox    Sent Client to Lab for: N/A    Did client attend any of the following based off CNs referral or appointments made? N/A     ED Visit Averted N/A    Life-Saving Intervention Made N/A      Questionnaire   Do you give verbal consent to treat you today? Yes    Location Patient Served  Not Applicable    Visit Setting Church or Organization    Patient Status Unknown    Insurance Medicare    Insurance Referral N/A    Medication N/A    Screening Referrals N/A    Intervention Blood pressure;Blood glucose;Counsel;Educate

## 2023-04-06 NOTE — Telephone Encounter (Signed)
Message received from Cheree Ditto, RN noting that the patient is requesting  a one time outpatient PT visit to see if they can recommend some strengthening exercises that he can do at home.

## 2023-04-06 NOTE — Telephone Encounter (Signed)
I called [facility]: [phone] to check on the status of the order for Freestyle sensors. I spoke to [place], [employer] who said that they have not been able to reach the patient and the order has been cancelled.  She then said that he has a  $78.58 co-pay for the order and would need to call them with payment prior to shipping the order.  She went on to say that they have never delivered sensors to him.   I called the patient and informed him of my call to [facility].  He told me that he doesn't get the sensors from [facility], he gets them from "[facility]" and he has no co-pay.  He said he just called [facility] and requested a delivery.  He also noted that he is very happy with his blood sugar control now and feels much better overall.

## 2023-04-07 ENCOUNTER — Ambulatory Visit: Payer: Medicare HMO | Admitting: Physician Assistant

## 2023-04-07 NOTE — Telephone Encounter (Signed)
noted 

## 2023-04-07 NOTE — Telephone Encounter (Signed)
PT referral made

## 2023-04-11 ENCOUNTER — Encounter (HOSPITAL_COMMUNITY): Payer: Self-pay | Admitting: *Deleted

## 2023-04-11 ENCOUNTER — Other Ambulatory Visit: Payer: Self-pay

## 2023-04-11 ENCOUNTER — Emergency Department (HOSPITAL_COMMUNITY)
Admission: EM | Admit: 2023-04-11 | Discharge: 2023-04-11 | Disposition: A | Discharge status: Home / Self Care | Payer: Medicare HMO | Attending: Emergency Medicine | Admitting: Emergency Medicine

## 2023-04-11 DIAGNOSIS — I129 Hypertensive chronic kidney disease with stage 1 through stage 4 chronic kidney disease, or unspecified chronic kidney disease: Secondary | ICD-10-CM | POA: Insufficient documentation

## 2023-04-11 DIAGNOSIS — Z7984 Long term (current) use of oral hypoglycemic drugs: Secondary | ICD-10-CM | POA: Insufficient documentation

## 2023-04-11 DIAGNOSIS — Z7982 Long term (current) use of aspirin: Secondary | ICD-10-CM | POA: Insufficient documentation

## 2023-04-11 DIAGNOSIS — M62830 Muscle spasm of back: Secondary | ICD-10-CM | POA: Insufficient documentation

## 2023-04-11 DIAGNOSIS — J449 Chronic obstructive pulmonary disease, unspecified: Secondary | ICD-10-CM | POA: Insufficient documentation

## 2023-04-11 DIAGNOSIS — Z8673 Personal history of transient ischemic attack (TIA), and cerebral infarction without residual deficits: Secondary | ICD-10-CM | POA: Diagnosis not present

## 2023-04-11 DIAGNOSIS — E1122 Type 2 diabetes mellitus with diabetic chronic kidney disease: Secondary | ICD-10-CM | POA: Diagnosis not present

## 2023-04-11 DIAGNOSIS — M62838 Other muscle spasm: Secondary | ICD-10-CM

## 2023-04-11 DIAGNOSIS — Z794 Long term (current) use of insulin: Secondary | ICD-10-CM | POA: Diagnosis not present

## 2023-04-11 DIAGNOSIS — N189 Chronic kidney disease, unspecified: Secondary | ICD-10-CM | POA: Insufficient documentation

## 2023-04-11 DIAGNOSIS — Z79899 Other long term (current) drug therapy: Secondary | ICD-10-CM | POA: Insufficient documentation

## 2023-04-11 DIAGNOSIS — M5459 Other low back pain: Secondary | ICD-10-CM | POA: Diagnosis not present

## 2023-04-11 DIAGNOSIS — M545 Low back pain, unspecified: Secondary | ICD-10-CM | POA: Diagnosis present

## 2023-04-11 MED ORDER — CYCLOBENZAPRINE HCL 10 MG PO TABS: 10.0000 mg | ORAL_TABLET | Freq: Two times a day (BID) | ORAL | 0 refills | Status: DC | PRN

## 2023-04-11 MED ORDER — CYCLOBENZAPRINE HCL 10 MG PO TABS
10.0000 mg | ORAL_TABLET | Freq: Once | ORAL | Status: AC
  Administered 2023-04-11: 10 mg via ORAL
  Filled 2023-04-11: qty 1

## 2023-04-11 MED ORDER — LIDOCAINE 5 % EX PTCH: 1.0000 | MEDICATED_PATCH | CUTANEOUS | 0 refills | Status: DC

## 2023-04-11 MED ORDER — ACETAMINOPHEN 325 MG PO TABS
650.0000 mg | ORAL_TABLET | Freq: Once | ORAL | Status: AC
  Administered 2023-04-11: 650 mg via ORAL
  Filled 2023-04-11: qty 2

## 2023-04-11 NOTE — Discharge Instructions (Addendum)
Your history and exam today are consistent with muscle spasm and musculoskeletal pain in your low back that likely was set off when you were twisting and bending your back today.  Given your history of previous back injury we initially planned on getting a CT scan to rule out fracture or dislocation however you did not have any numbness weakness or loss of bowel or bladder control that would precipitate need for MRI.  As it started to take longer to get a CT scan tonight, we agreed with the plan to let you go home and follow-up with a back doctor.  If symptoms are to change or worsen, please return to the nearest emergency department for likely reevaluation and possible imaging.  Please use the medicines to help with symptoms and rest.
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
## 2023-04-13 ENCOUNTER — Encounter: Payer: Medicare HMO | Admitting: Podiatry

## 2023-04-13 NOTE — Congregational Nurse Program (Signed)
Dept: [phone]   Congregational Nurse Program Note  Date of Encounter: 04/13/2023  Past Medical History: Past Medical History:  Diagnosis Date   Diabetes mellitus without complication (HCC)    Hypertension    Stroke [facility])     Encounter Details:  CNP Questionnaire - 04/13/23 1415       Questionnaire   Ask client: Do you give verbal consent for me to treat you today? Yes    Student Assistance N/A    Location Patient [facility]    Visit Setting with Client Organization    Patient Status Unknown   Has apartment at [facility] Medicare;Medicaid    Insurance/Financial Assistance Referral N/A    Medication N/A    Medical Provider Yes    Screening Referrals Made N/A    Medical Referrals [facility]    Medical Appointment Made [facility]    Recently w/o PCP, now 1st time PCP visit completed due to CNs referral or appointment made N/A    Food N/A    Transportation Need transportation assistance    Housing/Utilities N/A    Interpersonal Safety N/A    Interventions Counsel;Advocate/Support;Educate;Case Management;Navigate Healthcare System;Reviewed Medications    Abnormal to Normal Screening Since Last CN Visit N/A    Screenings CN Performed Blood Pressure;Pulse Ox    Sent Client to Lab for: N/A    Did client attend any of the following based off CNs referral or appointments made? N/A    ED Visit Averted N/A    Life-Saving Intervention Made N/A      Questionnaire   Do you give verbal consent to treat you today? Yes    Location Patient Served  Not Applicable    Visit Setting Church or Organization    Patient Status Unknown    Insurance Medicare    Insurance Referral N/A    Medication N/A    Screening Referrals N/A    Intervention Blood pressure;Blood glucose;Counsel;Educate               Dept: [phone]   Congregational Nurse Program Note  Date of Encounter: 04/13/2023  Clinic visit to check weight  and blood pressure, BP 123/76, pulse 95 and regular, weight 268 Lbs.  Verified that PCP approved physical therapy visit to evaluate for a home exercise program to strengthen right side extremities from previous CVA. Past Medical History: Past Medical History:  Diagnosis Date   Diabetes mellitus without complication (HCC)    Hypertension    Stroke [facility])     Encounter Details:  CNP Questionnaire - 04/13/23 1415       Questionnaire   Ask client: Do you give verbal consent for me to treat you today? Yes    Student Assistance N/A    Location Patient [facility]    Visit Setting with Client Organization    Patient Status Unknown   Has apartment at [facility] Medicare;Medicaid    Insurance/Financial Assistance Referral N/A    Medication N/A    Medical Provider Yes    Screening Referrals Made N/A    Medical Referrals Made [facility]    Medical Appointment Made [facility]    Recently w/o PCP, now 1st time PCP visit completed due to CNs referral or appointment made N/A    Food N/A    Transportation Need transportation assistance    Housing/Utilities N/A    Interpersonal Safety N/A    Interventions Counsel;Advocate/Support;Educate;Case Management;Navigate Healthcare System;Reviewed  Medications    Abnormal to Normal Screening Since Last CN Visit N/A    Screenings CN Performed Blood Pressure;Pulse Ox    Sent Client to Lab for: N/A    Did client attend any of the following based off CNs referral or appointments made? N/A    ED Visit Averted N/A    Life-Saving Intervention Made N/A      Questionnaire   Do you give verbal consent to treat you today? Yes    Location Patient Served  Not Applicable    Visit Setting Church or Organization    Patient Status Unknown    Insurance Medicare    Insurance Referral N/A    Medication N/A    Screening Referrals N/A    Intervention Blood pressure;Blood glucose;Counsel;Educate

## 2023-04-15 NOTE — Progress Notes (Signed)
Patient did not show for today's scheduled appointment (04/13/23)

## 2023-04-26 ENCOUNTER — Telehealth: Payer: Self-pay | Admitting: Critical Care Medicine

## 2023-04-26 ENCOUNTER — Other Ambulatory Visit: Payer: Self-pay

## 2023-04-26 ENCOUNTER — Ambulatory Visit: Payer: Medicare HMO | Attending: Critical Care Medicine | Admitting: Physical Therapy

## 2023-04-26 ENCOUNTER — Ambulatory Visit: Payer: Medicare HMO

## 2023-04-26 DIAGNOSIS — R293 Abnormal posture: Secondary | ICD-10-CM | POA: Insufficient documentation

## 2023-04-26 DIAGNOSIS — R278 Other lack of coordination: Secondary | ICD-10-CM | POA: Insufficient documentation

## 2023-04-26 DIAGNOSIS — Z789 Other specified health status: Secondary | ICD-10-CM | POA: Insufficient documentation

## 2023-04-26 DIAGNOSIS — Z7409 Other reduced mobility: Secondary | ICD-10-CM | POA: Insufficient documentation

## 2023-04-26 DIAGNOSIS — R2689 Other abnormalities of gait and mobility: Secondary | ICD-10-CM | POA: Insufficient documentation

## 2023-04-26 DIAGNOSIS — R262 Difficulty in walking, not elsewhere classified: Secondary | ICD-10-CM | POA: Insufficient documentation

## 2023-04-26 DIAGNOSIS — M6281 Muscle weakness (generalized): Secondary | ICD-10-CM | POA: Insufficient documentation

## 2023-04-26 DIAGNOSIS — R2681 Unsteadiness on feet: Secondary | ICD-10-CM | POA: Insufficient documentation

## 2023-04-26 NOTE — Telephone Encounter (Signed)
Copied from CRM [id number]. Topic: General - Inquiry >> Apr 26, 2023 12:34 PM Lennox Pippins wrote: Patient would like Robyne Peers to return his call @ # [phone]

## 2023-04-26 NOTE — Telephone Encounter (Signed)
Medication Refill - Medication:  insulin degludec (TRESIBA FLEXTOUCH) 200 UNIT/ML FlexTouch Pen  *completely out  Has the patient contacted their pharmacy? No, transferred call to pharmacy  Preferred Pharmacy (with phone number or street name):  [facility] Phone: [phone] Fax: [phone]   Has the patient been seen for an appointment in the last year OR does the patient have an upcoming appointment? Yes f/u on 04/28/23

## 2023-04-26 NOTE — Telephone Encounter (Signed)
[facility] called and spoke to [place], Patient Advocate about the refill(s) tresiba flextouch requested. Advised it was sent on 03/04/23 #45 ml/3 refill(s). She says that it's in queue to refill, but it is out of stock and will be available tomorrow after 1300.

## 2023-04-27 ENCOUNTER — Other Ambulatory Visit: Payer: Self-pay

## 2023-04-27 NOTE — Congregational Nurse Program (Signed)
  Dept: [phone]   Congregational Nurse Program Note  Date of Encounter: 04/27/2023  Clinic visit to check blood pressure and weight, BP 131/83, pulse 94 and regular, O2 Sat 98%, weight 270 Lbs.  States he missed appointment for PT evaluation d/t transportation not picking him up.  Blood glucose levels in AM between 90 and 101, afternoon in the 130's per continuous glucose monitor with Freestyle Libre except for one afternoon after eating too much candy.  Has not taken any Novolog insulin before dinner. Reviewed meals for past week and counseled regarding managing carbohydrates.   After visit notified via telephone that PT appointment is Friday at 2:45 PM. Past Medical History: Past Medical History:  Diagnosis Date   Diabetes mellitus without complication (HCC)    Hypertension    Stroke [facility])     Encounter Details:  CNP Questionnaire - 04/27/23 1350       Questionnaire   Ask client: Do you give verbal consent for me to treat you today? Yes    Student Assistance N/A    Location Patient [place]    Visit Setting with Hospital doctor;Phone/Text/Email    Patient Status Unknown   Has apartment at [facility] Medicare;Medicaid    Insurance/Financial Assistance Referral N/A    Medication N/A    Medical Provider Yes    Screening Referrals Made N/A    Medical Referrals Made [facility]    Medical Appointment Made [facility]    Recently w/o PCP, now 1st time PCP visit completed due to CNs referral or appointment made N/A    Food N/A    Transportation Need transportation assistance    Housing/Utilities N/A    Interpersonal Safety N/A    Interventions Counsel;Advocate/Support;Educate;Case Management;Navigate Healthcare System;Reviewed Medications    Abnormal to Normal Screening Since Last CN Visit N/A    Screenings CN Performed Blood Pressure;Pulse Ox;Weight    Sent Client to Lab for: N/A    Did client attend any of the  following based off CNs referral or appointments made? N/A    ED Visit Averted N/A    Life-Saving Intervention Made N/A      Questionnaire   Do you give verbal consent to treat you today? Yes    Location Patient Served  Not Applicable    Visit Setting Church or Organization    Patient Status Unknown    Insurance Medicare    Insurance Referral N/A    Medication N/A    Screening Referrals N/A    Intervention Blood pressure;Blood glucose;Counsel;Educate

## 2023-04-27 NOTE — Telephone Encounter (Signed)
I returned call to the patient and he was with Cheree Ditto, RN when I called.  They explained that he missed his neuro rehab appointment yesterday because transportation did not show up.  He can call to re-schedule.  He confirmed that he will be coming to his appointment at [facility] tomorrow, 10/9 and has transportation arranged.   He also noted that he will be leaving [employer] next month and moving into the [employer].

## 2023-04-28 ENCOUNTER — Other Ambulatory Visit: Payer: Self-pay

## 2023-04-28 ENCOUNTER — Ambulatory Visit: Payer: Medicare HMO | Attending: Nurse Practitioner | Admitting: Nurse Practitioner

## 2023-04-28 ENCOUNTER — Encounter: Payer: Self-pay | Admitting: Nurse Practitioner

## 2023-04-28 VITALS — BP 119/88 | HR 98 | Ht 67.0 in | Wt 270.0 lb

## 2023-04-28 DIAGNOSIS — Z23 Encounter for immunization: Secondary | ICD-10-CM | POA: Diagnosis not present

## 2023-04-28 DIAGNOSIS — M545 Low back pain, unspecified: Secondary | ICD-10-CM

## 2023-04-28 DIAGNOSIS — Z09 Encounter for follow-up examination after completed treatment for conditions other than malignant neoplasm: Secondary | ICD-10-CM | POA: Diagnosis not present

## 2023-04-28 MED ORDER — METHOCARBAMOL 750 MG PO TABS
750.0000 mg | ORAL_TABLET | Freq: Three times a day (TID) | ORAL | 1 refills | Status: DC | PRN
Start: 2023-04-28 — End: 2023-10-21
  Filled 2023-04-28 – 2023-04-29 (×2): qty 60, 20d supply, fill #0

## 2023-04-28 MED ORDER — METHOCARBAMOL 750 MG PO TABS
750.0000 mg | ORAL_TABLET | Freq: Three times a day (TID) | ORAL | 1 refills | Status: DC | PRN
Start: 2023-04-28 — End: 2023-04-28
  Filled 2023-04-28: qty 60, 20d supply, fill #0

## 2023-04-28 MED ORDER — GABAPENTIN 300 MG PO CAPS
300.0000 mg | ORAL_CAPSULE | Freq: Three times a day (TID) | ORAL | 1 refills | Status: DC
Start: 2023-04-28 — End: 2023-04-28
  Filled 2023-04-28: qty 90, 30d supply, fill #0

## 2023-04-28 MED ORDER — GABAPENTIN 100 MG PO CAPS: 100.0000 mg | ORAL_CAPSULE | Freq: Two times a day (BID) | ORAL | 1 refills | Status: DC

## 2023-04-28 NOTE — Progress Notes (Signed)
Assessment & Plan:  Reginald Rodriguez was seen today for er visit .  Diagnoses and all orders for this visit:  Acute bilateral low back pain without sciatica -     CT Lumbar Spine Wo Contrast; Future -     gabapentin (NEURONTIN) 300 MG capsule; Take 1 capsule (300 mg total) by mouth 3 (three) times daily. Please mail He declines to increase gabapentin to 300 mg due to side effects -     methocarbamol (ROBAXIN) 750 MG tablet; Take 1 tablet (750 mg total) by mouth every 8 (eight) hours as needed for muscle spasms.  Hospital discharge follow-up Follow up with results of lumbar CT and refer as needed  Flu vaccine administered today    Patient has been counseled on age-appropriate routine health concerns for screening and prevention. These are reviewed and up-to-date. Referrals have been placed accordingly. Immunizations are up-to-date or declined.    Subjective:   Chief Complaint  Patient presents with   ER Visit    HPI Reginald Rodriguez 72 y.o. male presents to office today for follow up to low back pain.  He is a patient of Dr. Lynelle Doctor.   Past medical history significant for CKD, COPD, previous stroke with right leg weakness at baseline, diabetes, hypertension, and AAA   HFU Seen in the ED on 04-11-2023 for low back pain. Per ED report: According to patient, he has had back pains on and off and had an injury back in prison years ago. He reports that today he was making a sandwich and was closing the refrigerator door twisting and had sudden onset of severe pain. Pain is severe and goes across his back and around his sides. It does not go down his legs. He denies any loss of bowel or bladder control and denies any numbness or weakness in the legs aside from his known stroke issues. Denies any other trauma. Denies fevers, chills, congestion, cough, nausea, vomiting, constipation or diarrhea. Denies urinary symptoms to suggest UTI. Denies rashes to suggest shingles.  CT was suggested however Reginald Rodriguez declined this in the ED.  He was dc'd with flexeril and lidocaine patch and instructed to follow up with neurosurgery If symptoms persisted.   Today he reports back pain is still persistent. Flexeril and lidoderm patch ineffective. Pain can get up to 9/10 with certain positions such as twisting and pulling.   Review of Systems  Constitutional:  Negative for fever, malaise/fatigue and weight loss.  HENT: Negative.  Negative for nosebleeds.   Eyes: Negative.  Negative for blurred vision, double vision and photophobia.  Respiratory: Negative.  Negative for cough and shortness of breath.   Cardiovascular: Negative.  Negative for chest pain, palpitations and leg swelling.  Gastrointestinal: Negative.  Negative for heartburn, nausea and vomiting.  Musculoskeletal:  Positive for back pain and myalgias.  Neurological: Negative.  Negative for dizziness, focal weakness, seizures and headaches.  Psychiatric/Behavioral: Negative.  Negative for suicidal ideas.     Past Medical History:  Diagnosis Date   Diabetes mellitus without complication (HCC)    Hypertension    Stroke [facility])     History reviewed. No pertinent surgical history.  History reviewed. No pertinent family history.  Social History Reviewed with no changes to be made today.   Outpatient Medications Prior to Visit  Medication Sig Dispense Refill   Accu-Chek Softclix Lancets lancets Use to check blood sugar 3 times daily. 100 each 2   amLODipine (NORVASC) 5 MG tablet TAKE 1 TABLET EVERY DAY  90 tablet 3   ammonium lactate (AMLACTIN) 12 % cream Apply 1 Application topically as needed for dry skin. 385 g 5   aspirin EC 81 MG tablet Take 1 tablet (81 mg total) by mouth daily. 100 tablet 1   atorvastatin (LIPITOR) 10 MG tablet Take 1 tablet (10 mg total) by mouth daily. 90 tablet 3   Blood Glucose Monitoring Suppl (ACCU-CHEK GUIDE) w/Device KIT Use to check blood sugar 3 times daily. 1 kit 0   chlorthalidone (HYGROTON) 25 MG  tablet TAKE 1 TABLET EVERY DAY 90 tablet 1   Continuous Glucose Sensor (FREESTYLE LIBRE 2 SENSOR) MISC Use to check blood sugar continuously throughout the day. Change sensors once every 14 days. E11.22 6 each 2   dapagliflozin propanediol (FARXIGA) 10 MG TABS tablet Take 1 tablet (10 mg total) by mouth daily before breakfast. 90 tablet 3   diclofenac Sodium (VOLTAREN) 1 % GEL Apply 4 g topically 4 (four) times daily. 100 g 0   fenofibrate micronized (LOFIBRA) 134 MG capsule Take 1 capsule (134 mg total) by mouth daily before breakfast. 90 capsule 3   glucose blood (ACCU-CHEK GUIDE) test strip 1 each by Other route 3 (three) times daily. Use to check blood sugar 3 times daily. 300 each 3   insulin aspart (NOVOLOG FLEXPEN) 100 UNIT/ML FlexPen Max daily 90 units 90 mL 3   insulin degludec (TRESIBA FLEXTOUCH) 200 UNIT/ML FlexTouch Pen Inject 68 Units into the skin daily in the afternoon. 45 mL 3   Insulin Pen Needle 31G X 5 MM MISC Use in the morning, at noon, in the evening, and at bedtime. 400 each 3   lactulose, encephalopathy, (CHRONULAC) 10 GM/15ML SOLN Take 15 mLs (10 g total) by mouth daily as needed (constipation). 480 mL 3   lidocaine (LIDODERM) 5 % Place 1 patch onto the skin daily. Remove & Discard patch within 12 hours or as directed by MD 15 patch 0   lisinopril (ZESTRIL) 2.5 MG tablet Take 1 tablet (2.5 mg total) by mouth daily. 90 tablet 3   nicotine (NICODERM CQ) 21 mg/24hr patch Place 1 patch (21 mg total) onto the skin daily. 28 patch 0   pioglitazone (ACTOS) 30 MG tablet Take 1 tablet (30 mg total) by mouth daily. 90 tablet 3   cyclobenzaprine (FLEXERIL) 10 MG tablet Take 1 tablet (10 mg total) by mouth 2 (two) times daily as needed for muscle spasms. 20 tablet 0   gabapentin (NEURONTIN) 100 MG capsule Take 1 capsule (100 mg total) by mouth 2 (two) times daily. 180 capsule 2   Facility-Administered Medications Prior to Visit  Medication Dose Route Frequency Provider Last Rate Last  Admin   glucose chewable tablet 4 g  1 tablet Oral Once Storm Frisk, MD        Allergies  Allergen Reactions   Bee Venom Swelling   Penicillins Rash       Objective:    BP 119/88 (BP Location: Left Arm, Patient Position: Sitting, Cuff Size: Large)   Pulse 98   Ht 5\' 7"  (1.702 m)   Wt 270 lb (122.5 kg)   SpO2 98%   BMI 42.29 kg/m  Wt Readings from Last 3 Encounters:  04/28/23 270 lb (122.5 kg)  04/27/23 270 lb (122.5 kg)  04/11/23 268 lb 1.3 oz (121.6 kg)    Physical Exam Vitals and nursing note reviewed.  Constitutional:      Appearance: He is well-developed.  HENT:     Head:  Normocephalic and atraumatic.  Cardiovascular:     Rate and Rhythm: Normal rate and regular rhythm.     Heart sounds: Normal heart sounds. No murmur heard.    No friction rub. No gallop.  Pulmonary:     Effort: Pulmonary effort is normal. No tachypnea or respiratory distress.     Breath sounds: Normal breath sounds. No decreased breath sounds, wheezing, rhonchi or rales.  Chest:     Chest wall: No tenderness.  Abdominal:     General: Bowel sounds are normal.     Palpations: Abdomen is soft.  Musculoskeletal:        General: Normal range of motion.     Cervical back: Normal range of motion.     Lumbar back: Spasms and tenderness present. No bony tenderness.  Skin:    General: Skin is warm and dry.  Neurological:     Mental Status: He is alert and oriented to person, place, and time.     Coordination: Coordination normal.  Psychiatric:        Behavior: Behavior normal. Behavior is cooperative.        Thought Content: Thought content normal.        Judgment: Judgment normal.          Patient has been counseled extensively about nutrition and exercise as well as the importance of adherence with medications and regular follow-up. The patient was given clear instructions to go to ER or return to medical center if symptoms don't improve, worsen or new problems develop. The patient  verbalized understanding.   Follow-up: Return in about 3 months (around 07/29/2023) for keep november appointment for a1c.   Claiborne Rigg, FNP-BC [facility] and [facility] [place], [place] [phone]   04/28/2023, 5:09 PM

## 2023-04-29 ENCOUNTER — Other Ambulatory Visit: Payer: Self-pay

## 2023-04-29 ENCOUNTER — Other Ambulatory Visit (HOSPITAL_COMMUNITY): Payer: Self-pay

## 2023-04-30 ENCOUNTER — Ambulatory Visit: Payer: Medicare HMO

## 2023-04-30 VITALS — BP 111/85 | HR 97

## 2023-04-30 DIAGNOSIS — R278 Other lack of coordination: Secondary | ICD-10-CM

## 2023-04-30 DIAGNOSIS — Z789 Other specified health status: Secondary | ICD-10-CM | POA: Diagnosis not present

## 2023-04-30 DIAGNOSIS — R2681 Unsteadiness on feet: Secondary | ICD-10-CM

## 2023-04-30 DIAGNOSIS — R2689 Other abnormalities of gait and mobility: Secondary | ICD-10-CM | POA: Diagnosis not present

## 2023-04-30 DIAGNOSIS — Z7409 Other reduced mobility: Secondary | ICD-10-CM | POA: Diagnosis not present

## 2023-04-30 DIAGNOSIS — R293 Abnormal posture: Secondary | ICD-10-CM

## 2023-04-30 DIAGNOSIS — M6281 Muscle weakness (generalized): Secondary | ICD-10-CM | POA: Diagnosis not present

## 2023-04-30 DIAGNOSIS — R262 Difficulty in walking, not elsewhere classified: Secondary | ICD-10-CM | POA: Diagnosis not present

## 2023-04-30 NOTE — Therapy (Signed)
OUTPATIENT PHYSICAL THERAPY NEURO EVALUATION   Patient Name: Reginald Rodriguez MRN: [id number] DOB:April 21, 1951, 72 y.o., male Today's Date: 04/30/2023   PCP: Bertram Denver, NP REFERRING PROVIDER: Shan Levans, MD  END OF SESSION:  PT End of Session - 04/30/23 1450     Visit Number 1    Number of Visits 17    Date for PT Re-Evaluation 06/25/23    Authorization Type [employer] medicare    Progress Note Due on Visit 10    PT Start Time 1447    PT Stop Time 1518    PT Time Calculation (min) 31 min    Equipment Utilized During Treatment Gait belt    Activity Tolerance Patient tolerated treatment well    Behavior During Therapy WFL for tasks assessed/performed             Past Medical History:  Diagnosis Date   Diabetes mellitus without complication (HCC)    Hypertension    Stroke [facility])    History reviewed. No pertinent surgical history. Patient Active Problem List   Diagnosis Date Noted   Impaired mobility and activities of daily living 03/02/2022   Stress incontinence 12/31/2021   Type 2 diabetes mellitus with diabetic polyneuropathy, with long-term current use of insulin (HCC) 03/13/2021   Type 2 diabetes mellitus with stage 3b chronic kidney disease, with long-term current use of insulin (HCC) 03/13/2021   Obesity, Class III, BMI 40-49.9 (morbid obesity) (HCC) 12/18/2020   Hyperlipidemia associated with type 2 diabetes mellitus (HCC) 12/17/2020   Tobacco use 06/27/2020   Fatty liver 05/13/2020   Arthritis:Knees, right shoulder 05/13/2020   COPD with emphysema (HCC) 05/13/2020   Erectile dysfunction 05/13/2020   OSA (obstructive sleep apnea) 05/13/2020   Onychomycosis of multiple toenails with type 2 diabetes mellitus (HCC) 05/13/2020   AAA (abdominal aortic aneurysm) (HCC) 05/13/2020   CKD (chronic kidney disease) stage 3, GFR 30-59 ml/min (HCC) 04/16/2020   Poor dentition 04/15/2020   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 Endorses R hemi weakness related to past CVA. "I'm not doing that well." Reports going to IP rehab and OP rehab after CVA in ~2016. Has a nurse that comes to help him ~3x a week.  Pt accompanied by: self  PERTINENT HISTORY: HTN, DM, CVA  PAIN:  Are you having pain? No Vitals:   04/30/23 1507  BP: 111/85  Pulse: 97    PRECAUTIONS: Fall  WEIGHT BEARING RESTRICTIONS: No  FALLS: Has patient fallen in last 6 months? No  LIVING ENVIRONMENT: Lives with: lives alone Lives in: House/apartment Stairs: No Has following equipment at home: Single point cane, Environmental consultant - 2 wheeled, Environmental consultant - 4 wheeled, Wheelchair (power), and Grab bars  PLOF: Requires assistive device for independence, Needs assistance with ADLs, and Needs assistance with homemaking  PATIENT GOALS: To get stronger  OBJECTIVE:  Note: Objective measures were completed at Evaluation unless otherwise noted  COGNITION: Overall cognitive status: Impaired   SENSATION: WFL  COORDINATION: Limited by strength R LE  POSTURE: rounded shoulders, forward head, increased thoracic kyphosis, posterior pelvic tilt, and flexed trunk    LOWER EXTREMITY MMT:    MMT Right Eval  Left Eval  Hip flexion 2 4  Hip extension    Hip abduction 2 4  Hip adduction 2 4  Hip internal rotation    Hip external rotation    Knee flexion 2 4  Knee extension 2 4  Ankle dorsiflexion 2 4  Ankle plantarflexion 2 4  Ankle inversion    Ankle eversion    (Blank rows = not tested)  BED MOBILITY:  Able to complete independently, but with difficulty  TRANSFERS: Assistive device utilized: Environmental consultant - 4 wheeled  Sit to stand: Modified independence Stand to sit: Modified independence Chair to chair: Modified independence  GAIT: Gait pattern: step through pattern, decreased stride length, Right foot flat, trendelenburg, trunk flexed, wide BOS, and poor foot clearance- Right Distance walked: clinic Assistive device utilized: Walker - 4 wheeled Level of assistance: Modified independence Comments: adjusted up-walker for proper positioning   FUNCTIONAL TESTS:   [facility] PT Assessment - 04/30/23 0001       Standardized Balance Assessment   Standardized Balance Assessment Five Times Sit to Stand    Five times sit to stand comments  28.25   CGA + BUE   10 Meter Walk .78m/s   Up-walker              TODAY'S TREATMENT:                                                                                                                              N/a eval   PATIENT EDUCATION: Education details: PT POC, exam findings, use of Up-walker Person educated: Patient Education method: Medical illustrator Education comprehension: verbalized understanding and needs further education  HOME EXERCISE PROGRAM: To be provided  GOALS:  Goals reviewed with patient? Yes  SHORT TERM GOALS: Target date: 05/28/23  Pt will be independent with initial HEP for improved balance and functional strength  Baseline: Goal status: INITIAL  2.  Pt will improve 5x STS to </= 23 sec to demo improved functional LE strength and balance   Baseline: 28.25 Goal status: INITIAL  3.  Pt will improve  gait speed to >/= .38m/s to demonstrate improved community ambulation  Baseline: .27m/s Goal status: INITIAL  4.  TUG goal Baseline: to be assessed Goal status: INITIAL   LONG TERM GOALS: Target date: 06/25/23  Pt will be independent with final HEP for improved balance and functional strength  Baseline: Goal status: INITIAL  Pt will improve 5x STS to </= 17 sec to demo improved functional LE strength and balance   Baseline: 28.25 Goal status: INITIAL  Pt will improve gait speed to >/= .69m/s to demonstrate improved community ambulation  Baseline: .85m/s Goal status: INITIAL  TUG goal Baseline: to be assessed Goal status: INITIAL  ASSESSMENT:  CLINICAL IMPRESSION: Patient is a 72 y.o. male who was seen today for physical therapy evaluation and treatment for impaired mobility related to chronic CVA. Eval slightly limited by patients need to use the restroom. Five times Sit to Stand Test (FTSS) Method: Use a straight back chair with a solid seat that is 17-18" high. Ask participant to sit on the chair with arms folded across their chest.   Instructions: "Stand up and sit down as quickly as possible 5 times, keeping your arms folded across your chest."   Measurement: Stop timing when the participant touches the chair in sitting the 5th time.  TIME: 28.25 sec  Cut off scores indicative of increased fall risk: >12 sec CVA, >16 sec PD, >13 sec vestibular (ANPTA Core Set of Outcome Measures for Adults with Neurologic Conditions, 2018). 10 Meter Walk Test: Patient instructed to walk 10 meters (32.8 ft) as quickly and as safely as possible at their normal speed x2 and at a fast speed x2. Time measured from 2 meter mark to 8 meter mark to accommodate ramp-up and ramp-down.  Normal speed: .23m/s Cut off scores: <0.4 m/s = household Ambulator, 0.4-0.8 m/s = limited community Ambulator, >0.8 m/s = community Ambulator, >1.2 m/s = crossing a street, <1.0 = increased fall risk MCID 0.05 m/s  (small), 0.13 m/s (moderate), 0.06 m/s (significant)  (ANPTA Core Set of Outcome Measures for Adults with Neurologic Conditions, 2018). He would benefit from skilled PT services to address the above mentioned deficits.   OBJECTIVE IMPAIRMENTS: Abnormal gait, cardiopulmonary status limiting activity, decreased balance, decreased coordination, decreased endurance, decreased knowledge of condition, decreased knowledge of use of DME, decreased strength, postural dysfunction, and pain.   ACTIVITY LIMITATIONS: carrying, lifting, bending, stairs, transfers, bed mobility, bathing, hygiene/grooming, locomotion level, and caring for others  PARTICIPATION LIMITATIONS: meal prep, cleaning, interpersonal relationship, driving, shopping, and community activity  PERSONAL FACTORS: Age, Fitness, Past/current experiences, Social background, Time since onset of injury/illness/exacerbation, Transportation, and 3+ comorbidities: see above  are also affecting patient's functional outcome.   REHAB POTENTIAL: Fair time since onset  CLINICAL DECISION MAKING: Stable/uncomplicated  EVALUATION COMPLEXITY: Low  PLAN:  PT FREQUENCY: 2x/week  PT DURATION: 8 weeks  PLANNED INTERVENTIONS: 97146- PT Re-evaluation, 97110-Therapeutic exercises, 97530- Therapeutic activity, [id number]- Neuromuscular re-education, 97535- Self Care, 78295- Manual therapy, [id number]- Gait training, [phone]- Orthotic Fit/training, [phone]- Canalith repositioning, [phone]- Aquatic Therapy, Patient/Family education, Dry Needling, Vestibular training, Visual/preceptual remediation/compensation, and DME instructions  PLAN FOR NEXT  SESSION: TUG, HEP   Westley Foots, PT Westley Foots, PT, DPT, CBIS  04/30/2023, 3:19 PM

## 2023-05-03 ENCOUNTER — Encounter: Payer: Self-pay | Admitting: Nurse Practitioner

## 2023-05-04 ENCOUNTER — Encounter: Payer: Self-pay | Admitting: Internal Medicine

## 2023-05-04 ENCOUNTER — Ambulatory Visit (INDEPENDENT_AMBULATORY_CARE_PROVIDER_SITE_OTHER): Payer: Medicare HMO | Admitting: Internal Medicine

## 2023-05-04 ENCOUNTER — Other Ambulatory Visit: Payer: Self-pay

## 2023-05-04 VITALS — BP 124/82 | HR 91 | Ht 67.0 in | Wt 275.0 lb

## 2023-05-04 DIAGNOSIS — E1165 Type 2 diabetes mellitus with hyperglycemia: Secondary | ICD-10-CM

## 2023-05-04 DIAGNOSIS — Z794 Long term (current) use of insulin: Secondary | ICD-10-CM | POA: Diagnosis not present

## 2023-05-04 LAB — POCT GLYCOSYLATED HEMOGLOBIN (HGB A1C): Hemoglobin A1C: 6.8 % — AB (ref 4.0–5.6)

## 2023-05-04 MED ORDER — INSULIN PEN NEEDLE 31G X 5 MM MISC: 1.0000 | Freq: Four times a day (QID) | 3 refills | Status: DC

## 2023-05-04 MED ORDER — TRESIBA FLEXTOUCH 200 UNIT/ML ~~LOC~~ SOPN: 64.0000 [IU] | PEN_INJECTOR | Freq: Every day | SUBCUTANEOUS | 3 refills | Status: DC

## 2023-05-04 MED ORDER — NOVOLOG FLEXPEN 100 UNIT/ML ~~LOC~~ SOPN: PEN_INJECTOR | SUBCUTANEOUS | 3 refills | Status: DC

## 2023-05-04 MED ORDER — DAPAGLIFLOZIN PROPANEDIOL 10 MG PO TABS: 10.0000 mg | ORAL_TABLET | Freq: Every day | ORAL | 3 refills | Status: DC

## 2023-05-04 MED ORDER — PIOGLITAZONE HCL 30 MG PO TABS: 30.0000 mg | ORAL_TABLET | Freq: Every day | ORAL | 3 refills | Status: DC

## 2023-05-04 NOTE — Progress Notes (Signed)
Name: Reginald Rodriguez  MRN/ DOB: [id number], 1951-07-17   Age/ Sex: 72 y.o., male    PCP: Claiborne Rigg, NP   Reason for Endocrinology Evaluation: Type 2 Diabetes Mellitus     Date of Initial Endocrinology Visit: 03/12/2021    PATIENT IDENTIFIER: [name] is a 72 y.o. male with a past medical history of DM, HTN , COPD , Hx of cocaine abuse and Dyslipidemia. The patient presented for initial endocrinology clinic visit on 03/12/2021 for consultative assistance with his diabetes management.     DIABETIC HISTORY:  [name] was diagnosed with DM many years ago. His hemoglobin A1c has ranged from 7.7%, peaking at 9.7% in 2021  He is intolerant to higher doses of Trulicity   On his initial visit to our clinic his A1c was 9.2 % we started farxiga, continued Trulicity and Basal/ Prandial regimen   Trulicity was stopped 09/2021 as he could only tolerate the smallest dose and we opted to start pioglitazone  SUBJECTIVE:   During the last visit (10/21/2022): A1c 7.9 %      Today (05/04/23): [name] is here for a follow up on diabetes management.   He checks his sugars blood sugars multiple times daily through CGM. The patient has had hypoglycemic episodes since the last clinic visit.He is symptomatic and they are rare occurring    He was seen by podiatry 10/11/2022  Denies nausea  or vomiting  Denies constipation or diarrhea   He has not been taking Novolog with lunch nor Supper    HOME DIABETES REGIMEN: Tresiba 68  units daily at night  Novolog 32 units with breakfast, 32 units with lunch, and 24 units with supper Pioglitazone 30 mg daily  Farxiga 10 mg daily      Statin: yes ACE-I/ARB: yes   CONTINUOUS GLUCOSE MONITORING RECORD INTERPRETATION    Dates of Recording: 10/2-10/15/2024  Sensor description:freestyle libre  Results statistics:   CGM use % of time 81  Average and SD 115/25.9  Time in range   95 %  % Time Above 180 3  % Time above 250 0  % Time  Below target 2   Glycemic patterns summary: BGs are optimal throughout the day and the night  Hyperglycemic episodes  postprandial   Hypoglycemic episodes occurred post bolus  Overnight periods: optimal with rare hypoglycemia     DIABETIC COMPLICATIONS: Microvascular complications:  CKD III, neuropathy  Denies: retinopathy  Last eye exam: Completed 10/14/2021  Macrovascular complications:  CVA ( Right hemiparesis )  Denies: CAD, PVD   PAST HISTORY: Past Medical History:  Past Medical History:  Diagnosis Date   Diabetes mellitus without complication (HCC)    Hypertension    Stroke [facility])    Past Surgical History: No past surgical history on file.  Social History:  reports that he has been smoking cigarettes. He has a 46 pack-year smoking history. He has quit using smokeless tobacco. He reports that he does not currently use alcohol. He reports that he does not currently use drugs. Family History: No family history on file.   HOME MEDICATIONS: Allergies as of 05/04/2023       Reactions   Bee Venom Swelling   Penicillins Rash        Medication List        Accurate as of May 04, 2023  8:52 AM. If you have any questions, ask your nurse or doctor.          Accu-Chek Guide test strip  Generic drug: glucose blood 1 each by Other route 3 (three) times daily. Use to check blood sugar 3 times daily.   Accu-Chek Guide w/Device Kit Use to check blood sugar 3 times daily.   Accu-Chek Softclix Lancets lancets Use to check blood sugar 3 times daily.   amLODipine 5 MG tablet Commonly known as: NORVASC TAKE 1 TABLET EVERY DAY   ammonium lactate 12 % cream Commonly known as: AMLACTIN Apply 1 Application topically as needed for dry skin.   aspirin EC 81 MG tablet Take 1 tablet (81 mg total) by mouth daily.   atorvastatin 10 MG tablet Commonly known as: LIPITOR Take 1 tablet (10 mg total) by mouth daily.   chlorthalidone 25 MG tablet Commonly known as:  HYGROTON TAKE 1 TABLET EVERY DAY   dapagliflozin propanediol 10 MG Tabs tablet Commonly known as: Farxiga Take 1 tablet (10 mg total) by mouth daily before breakfast.   diclofenac Sodium 1 % Gel Commonly known as: VOLTAREN Apply 4 g topically 4 (four) times daily.   fenofibrate micronized 134 MG capsule Commonly known as: LOFIBRA Take 1 capsule (134 mg total) by mouth daily before breakfast.   FreeStyle Libre 2 Sensor Misc Use to check blood sugar continuously throughout the day. Change sensors once every 14 days. E11.22   gabapentin 100 MG capsule Commonly known as: NEURONTIN Take 1 capsule (100 mg total) by mouth 2 (two) times daily. Please mail   Insulin Pen Needle 31G X 5 MM Misc Use in the morning, at noon, in the evening, and at bedtime.   lactulose (encephalopathy) 10 GM/15ML Soln Commonly known as: CHRONULAC Take 15 mLs (10 g total) by mouth daily as needed (constipation).   lidocaine 5 % Commonly known as: Lidoderm Place 1 patch onto the skin daily. Remove & Discard patch within 12 hours or as directed by MD   lisinopril 2.5 MG tablet Commonly known as: ZESTRIL Take 1 tablet (2.5 mg total) by mouth daily.   methocarbamol 750 MG tablet Commonly known as: ROBAXIN Take 1 tablet (750 mg total) by mouth every 8 (eight) hours as needed for muscle spasms.   nicotine 21 mg/24hr patch Commonly known as: Nicoderm CQ Place 1 patch (21 mg total) onto the skin daily.   NovoLOG FlexPen 100 UNIT/ML FlexPen Generic drug: insulin aspart Max daily 90 units   pioglitazone 30 MG tablet Commonly known as: Actos Take 1 tablet (30 mg total) by mouth daily.   Evaristo Bury FlexTouch 200 UNIT/ML FlexTouch Pen Generic drug: insulin degludec Inject 68 Units into the skin daily in the afternoon.         ALLERGIES: Allergies  Allergen Reactions   Bee Venom Swelling   Penicillins Rash     OBJECTIVE:   VITAL SIGNS: BP 124/82 (BP Location: Left Arm, Patient Position:  Sitting, Cuff Size: Large)   Pulse 91   Ht 5\' 7"  (1.702 m)   Wt 275 lb (124.7 kg)   SpO2 99%   BMI 43.07 kg/m    PHYSICAL EXAM:  General: Pt appears well and is in NAD with a walker  Lungs: Clear with good BS bilat   Heart: RRR  Extremities:  Lower extremities - trace  pretibial edema.  Neuro: MS is good with appropriate affect, pt is alert and Ox3    DM Foot Exam 01/11/2023 per podiatry    DATA REVIEWED:  Lab Results  Component Value Date   HGBA1C 6.8 (A) 05/04/2023   HGBA1C 7.5 (A) 03/04/2023   HGBA1C  7.9 (A) 10/21/2022    Latest Reference Range & Units 03/17/23 15:52  Sodium 135 - 145 mmol/L 138  Potassium 3.5 - 5.1 mmol/L 3.3 (L)  Chloride 98 - 111 mmol/L 103  CO2 22 - 32 mmol/L 22  Glucose 70 - 99 mg/dL 95  BUN 8 - 23 mg/dL 29 (H)  Creatinine 5.40 - 1.24 mg/dL 9.81 (H)  Calcium 8.9 - 10.3 mg/dL 9.7  Anion gap 5 - 15  13  Alkaline Phosphatase 38 - 126 U/L 44  Albumin 3.5 - 5.0 g/dL 3.9  AST 15 - 41 U/L 26  ALT 0 - 44 U/L 24  Total Protein 6.5 - 8.1 g/dL 7.0  Total Bilirubin 0.3 - 1.2 mg/dL 0.2 (L)  GFR, Estimated >60 mL/min 43 (L)      ASSESSMENT / PLAN / RECOMMENDATIONS:   1) Type 2 Diabetes Mellitus, Optimally controlled , With Neuropathic, CKD III and macrovascular  complications - Most recent A1c of 6.8 %. Goal A1c < 7.0 %.    -I have placed the patient on improved glycemic control -His insulin regimen has been decreased by the community nurse, he is on NovoLog with breakfast only? -I would not change that at this point, but he will be provided with a correction scale to be used before each meal for hyperglycemia - He developed severe nausea to a small dose of trulicity in the past but today he declines trying any other GLP-1 agonists  -Will decrease basal insulin as below as well as NovoLog  MEDICATIONS:   Continue pioglitazone 30 mg daily Continue farxiga  10 tablet every morning  Decrease Tresiba 64 units once daily  Decrease Novolog 30  units with Breakfast only CF: Novolog (BG -130/30) TID QAC    EDUCATION / INSTRUCTIONS: BG monitoring instructions: Patient is instructed to check his blood sugars 3 times a day, before meals. Call [facility] if: BG persistently < 70  I reviewed the Rule of 15 for the treatment of hypoglycemia in detail with the patient. Literature supplied.   2) Diabetic complications:  Eye: Does not have known diabetic retinopathy.  Neuro/ Feet: Does  have known diabetic peripheral neuropathy. Renal: Patient does  have known baseline CKD. He is  on an ACEI/ARB at present.     F/U in 6 months     Signed electronically by: Lyndle Herrlich, MD  [facility]  [facility] [address] [place] [place], [place] [address] Phone: [phone] FAX: [phone]   CC: Claiborne Rigg, NP [address] [place] [address] [place] [address] Phone: [phone]  Fax: [phone]    Return to Endocrinology clinic as below: Future Appointments  Date Time Provider Department Center  05/04/2023 10:10 AM Jasminemarie Sherrard, Konrad Dolores, MD [facility] None  05/07/2023  8:45 AM Plaster, Jill Alexanders, PT [facility]  05/10/2023  8:45 AM Plaster, Jill Alexanders, PT [facility]  05/14/2023  8:45 AM Westley Foots, PT [facility]  05/17/2023  8:45 AM Westley Foots, PT [facility]  05/21/2023  8:45 AM Peter Congo, PT [facility]  05/26/2023  8:45 AM Plaster, Jill Alexanders, PT [facility]  05/28/2023  8:45 AM Peter Congo, PT [facility]  05/28/2023 12:40 PM GI-315 CT 1 GI-315CT [facility] [address] [facility]  05/31/2023  8:45 AM Westley Foots, PT [facility]  06/01/2023 11:40 AM GI-315 CT 2 GI-315CT GI-[facility] [address] [facility]  06/04/2023  8:45 AM Westley Foots, PT [facility]  06/07/2023  8:45  AM Westley Foots, PT [facility]  06/11/2023  8:45 AM Peter Congo, PT [facility]  06/14/2023  8:45 AM Westley Foots, PT [facility]   06/15/2023  9:50 AM Claiborne Rigg, NP [facility] None  06/16/2023  8:45 AM Plaster, Jill Alexanders, PT [facility]

## 2023-05-04 NOTE — Patient Instructions (Addendum)
-   A1c 6.8% , Keep UP the Good Work !   - Continue  Pioglitazone (Actos) 30 mg daily  - Continue farxiga 10 tablet every morning  - Decrease  Tresiba 64 units once daily  - Decrease Novolog 30 units with Breakfast Novolog correctional insulin: ADD extra units on insulin to your meal-time Novolog  dose if your blood sugars are higher than 160. Use the scale below to help guide you:   Blood sugar before meal Number of units to inject  Less than 160 0 unit  161 -  190 1 units  191 -  220 2 units  221 -  250 3 units  251 -  280 4 units  281 -  310 5 units  311 -  340 6 units        HOW TO TREAT LOW BLOOD SUGARS (Blood sugar LESS THAN 70 MG/DL) Please follow the RULE OF 15 for the treatment of hypoglycemia treatment (when your (blood sugars are less than 70 mg/dL)   STEP 1: Take 15 grams of carbohydrates when your blood sugar is low, which includes:  3-4 GLUCOSE TABS  OR 3-4 OZ OF JUICE OR REGULAR SODA OR ONE TUBE OF GLUCOSE GEL    STEP 2: RECHECK blood sugar in 15 MINUTES STEP 3: If your blood sugar is still low at the 15 minute recheck --> then, go back to STEP 1 and treat AGAIN with another 15 grams of carbohydrates.

## 2023-05-05 ENCOUNTER — Other Ambulatory Visit (HOSPITAL_COMMUNITY): Payer: Self-pay

## 2023-05-05 ENCOUNTER — Other Ambulatory Visit (HOSPITAL_BASED_OUTPATIENT_CLINIC_OR_DEPARTMENT_OTHER): Payer: Medicare HMO | Admitting: Pharmacist

## 2023-05-05 ENCOUNTER — Encounter: Payer: Self-pay | Admitting: Internal Medicine

## 2023-05-05 DIAGNOSIS — I10 Essential (primary) hypertension: Secondary | ICD-10-CM | POA: Diagnosis not present

## 2023-05-05 NOTE — Progress Notes (Signed)
Pharmacy Quality Measure Review  This patient is appearing on a report for being at risk of failing the adherence measure for hypertension (ACEi/ARB) medications this calendar year.   Medication: lisinopril Last fill date: 02/07/2023 for 90 day supply  Insurance report was not up to date. No action needed at this time.   Butch Penny, PharmD, P[place]y [place], CPP Clinical Pharmacist [facility] [phone]

## 2023-05-06 ENCOUNTER — Other Ambulatory Visit: Payer: Self-pay

## 2023-05-07 ENCOUNTER — Ambulatory Visit: Payer: Medicare HMO | Admitting: Physical Therapy

## 2023-05-07 VITALS — BP 107/75 | HR 97

## 2023-05-07 DIAGNOSIS — R278 Other lack of coordination: Secondary | ICD-10-CM | POA: Diagnosis not present

## 2023-05-07 DIAGNOSIS — R293 Abnormal posture: Secondary | ICD-10-CM | POA: Diagnosis not present

## 2023-05-07 DIAGNOSIS — M6281 Muscle weakness (generalized): Secondary | ICD-10-CM

## 2023-05-07 DIAGNOSIS — Z789 Other specified health status: Secondary | ICD-10-CM | POA: Diagnosis not present

## 2023-05-07 DIAGNOSIS — R262 Difficulty in walking, not elsewhere classified: Secondary | ICD-10-CM | POA: Diagnosis not present

## 2023-05-07 DIAGNOSIS — R2681 Unsteadiness on feet: Secondary | ICD-10-CM

## 2023-05-07 DIAGNOSIS — R2689 Other abnormalities of gait and mobility: Secondary | ICD-10-CM | POA: Diagnosis not present

## 2023-05-07 DIAGNOSIS — Z7409 Other reduced mobility: Secondary | ICD-10-CM | POA: Diagnosis not present

## 2023-05-07 NOTE — Therapy (Signed)
OUTPATIENT PHYSICAL THERAPY NEURO TREATMENT   Patient Name: Reginald Rodriguez MRN: [id number] DOB:02/01/1951, 72 y.o., male Today's Date: 05/07/2023   PCP: Bertram Denver, NP REFERRING PROVIDER: Shan Levans, MD  END OF SESSION:  PT End of Session - 05/07/23 0852     Visit Number 2    Number of Visits 17    Date for PT Re-Evaluation 06/25/23    Authorization Type [employer] medicare    Progress Note Due on Visit 10    PT Start Time 0849    PT Stop Time 0927    PT Time Calculation (min) 38 min    Equipment Utilized During Treatment Gait belt    Activity Tolerance Patient tolerated treatment well    Behavior During Therapy WFL for tasks assessed/performed             Past Medical History:  Diagnosis Date   Diabetes mellitus without complication (HCC)    Hypertension    Stroke (HCC)    No past surgical history on file. Patient Active Problem List   Diagnosis Date Noted   Impaired mobility and activities of daily living 03/02/2022   Stress incontinence 12/31/2021   Type 2 diabetes mellitus with diabetic polyneuropathy, with long-term current use of insulin (HCC) 03/13/2021   Type 2 diabetes mellitus with stage 3b chronic kidney disease, with long-term current use of insulin (HCC) 03/13/2021   Obesity, Class III, BMI 40-49.9 (morbid obesity) (HCC) 12/18/2020   Hyperlipidemia associated with type 2 diabetes mellitus (HCC) 12/17/2020   Tobacco use 06/27/2020   Fatty liver 05/13/2020   Arthritis:Knees, right shoulder 05/13/2020   COPD with emphysema (HCC) 05/13/2020   Erectile dysfunction 05/13/2020   OSA (obstructive sleep apnea) 05/13/2020   Onychomycosis of multiple toenails with type 2 diabetes mellitus (HCC) 05/13/2020   AAA (abdominal aortic aneurysm) (HCC) 05/13/2020   CKD (chronic kidney disease) stage 3, GFR 30-59 ml/min (HCC) 04/16/2020   Poor dentition 04/15/2020   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 04/15/2020   Uncontrolled type 2  diabetes mellitus with hyperglycemia (HCC) 04/15/2020   Essential hypertension 04/15/2020    ONSET DATE:   04/07/2023  referral  REFERRING DIAG: Z74.09,Z78.9 (ICD-10-CM) - Impaired mobility and activities of daily living  THERAPY DIAG:  Muscle weakness (generalized)  Unsteadiness on feet  Difficulty in walking, not elsewhere classified  Rationale for Evaluation and Treatment: Rehabilitation  SUBJECTIVE:                                                                                                                                                                                             SUBJECTIVE  STATEMENT: Patient arrives to PT eval using Up-walker, but with UE on elbow pads. Reports he had to readjust his walker from last session because he could not fit it in the car. Denies falls or acute changes. Reports back pain today, states he did not get his correct medicine and has been working with the pharmacy. Unable to rate low back pain. Reports his blood glucose was 93 mg/dL   Pt accompanied by: self  PERTINENT HISTORY: HTN, DM, CVA  PAIN:  Are you having pain? Yes: NPRS scale: N/A/10 Pain location: Low back    PRECAUTIONS: Fall  WEIGHT BEARING RESTRICTIONS: No  FALLS: Has patient fallen in last 6 months? No  LIVING ENVIRONMENT: Lives with: lives alone Lives in: House/apartment Stairs: No Has following equipment at home: Single point cane, Environmental consultant - 2 wheeled, Environmental consultant - 4 wheeled, Wheelchair (power), and Grab bars  PLOF: Requires assistive device for independence, Needs assistance with ADLs, and Needs assistance with homemaking  PATIENT GOALS: To get stronger  OBJECTIVE:  Note: Objective measures were completed at Evaluation unless otherwise noted  COGNITION: Overall cognitive status: Impaired   SENSATION: WFL  COORDINATION: Limited by strength R LE  POSTURE: rounded shoulders, forward head, increased thoracic kyphosis, posterior pelvic tilt, and flexed  trunk    LOWER EXTREMITY MMT:    MMT Right Eval Left Eval  Hip flexion 2 4  Hip extension    Hip abduction 2 4  Hip adduction 2 4  Hip internal rotation    Hip external rotation    Knee flexion 2 4  Knee extension 2 4  Ankle dorsiflexion 2 4  Ankle plantarflexion 2 4  Ankle inversion    Ankle eversion    (Blank rows = not tested)  BED MOBILITY:  Able to complete independently, but with difficulty  TRANSFERS: Assistive device utilized: Environmental consultant - 4 wheeled  Sit to stand: Modified independence Stand to sit: Modified independence Chair to chair: Modified independence  VITALS  Vitals:   05/07/23 0901  BP: 107/75  Pulse: 97     TODAY'S TREATMENT:    Ther Act  Assessed vitals (see above) and WNL for therapy. Pt reports he did not bring his Libre monitor to session, encouraged him to bring it next session so we can safely monitor glucose.  Pt requesting to use restroom but did not want to walk, so obtained clinic [facility] and escorted pt to bathroom. Pt able to stand and walk into/out of bathroom w/o AD w/CGA. Escorted pt back to treatment area in [facility] and informed pt he is welcome to use restroom once he arrives to clinic. Pt verbalized understanding.   Ther Ex  SciFit multi-peaks level 2 for 8 minutes using BUE/BLEs for neural priming for reciprocal movement, dynamic cardiovascular warmup and increased amplitude of stepping. Pt required mod A to place R foot into pedal, as pt repeating he "could not do it". At end of activity, challenged pt to remove R foot from pedal independently and pt surprised at his ability to perform. Pt them placed his R foot into/out of bike pedal x4 independently w/min cues to avoid holding breath. Encouraged pt to start trying to do more on his own at home for improved strength and independence, pt verbalized understanding.     GAIT: Gait pattern: step through pattern, decreased stride length, Right foot flat, trendelenburg, trunk flexed, wide BOS, and  poor foot clearance- Right Distance walked: Various clinic distances Assistive device utilized: Walker - 4 wheeled and None Level  of assistance: Modified independence and CGA Comments: Attempted to reposition handles of rollator to enable pt to push walker forward w/handles rather than place hands on elbow pads. However, unable to configure this. Pt requesting to walk short distances throughout session w/o AD and CGA, no LOB noted.    PATIENT EDUCATION: Education details: Bringing Libre to next session, being more independent w/RLE management at home  Person educated: Patient Education method: Explanation Education comprehension: verbalized understanding and needs further education  HOME EXERCISE PROGRAM: To be provided  GOALS: Goals reviewed with patient? Yes  SHORT TERM GOALS: Target date: 05/28/23  Pt will be independent with initial HEP for improved balance and functional strength  Baseline: Goal status: INITIAL  2.  Pt will improve 5x STS to </= 23 sec to demo improved functional LE strength and balance   Baseline: 28.25 Goal status: INITIAL  3.  Pt will improve gait speed to >/= .53m/s to demonstrate improved community ambulation  Baseline: .3m/s Goal status: INITIAL  4.  TUG goal Baseline: to be assessed Goal status: INITIAL   LONG TERM GOALS: Target date: 06/25/23  Pt will be independent with final HEP for improved balance and functional strength  Baseline: Goal status: INITIAL  Pt will improve 5x STS to </= 17 sec to demo improved functional LE strength and balance   Baseline: 28.25 Goal status: INITIAL  Pt will improve gait speed to >/= .51m/s to demonstrate improved community ambulation  Baseline: .57m/s Goal status: INITIAL  TUG goal Baseline: to be assessed Goal status: INITIAL  ASSESSMENT:  CLINICAL IMPRESSION: Emphasis of skilled PT session on pt education on endurance. Session limited as pt requested to use restroom during session and required  therapist assistance due to not wanting to walk >100' to bathroom. Pt able to walk short distances throughout session w/o AD w/no instability noted. Pt largely limited by poor cardiovascular endurance and fear-avoidance behavior, surprising himself w/ability to lift his RLE today to marching position as he was adamant that he could not perform. Continue POC.   OBJECTIVE IMPAIRMENTS: Abnormal gait, cardiopulmonary status limiting activity, decreased balance, decreased coordination, decreased endurance, decreased knowledge of condition, decreased knowledge of use of DME, decreased strength, postural dysfunction, and pain.   ACTIVITY LIMITATIONS: carrying, lifting, bending, stairs, transfers, bed mobility, bathing, hygiene/grooming, locomotion level, and caring for others  PARTICIPATION LIMITATIONS: meal prep, cleaning, interpersonal relationship, driving, shopping, and community activity  PERSONAL FACTORS: Age, Fitness, Past/current experiences, Social background, Time since onset of injury/illness/exacerbation, Transportation, and 3+ comorbidities: see above  are also affecting patient's functional outcome.   REHAB POTENTIAL: Fair time since onset  CLINICAL DECISION MAKING: Stable/uncomplicated  EVALUATION COMPLEXITY: Low  PLAN:  PT FREQUENCY: 2x/week  PT DURATION: 8 weeks  PLANNED INTERVENTIONS: 97146- PT Re-evaluation, 97110-Therapeutic exercises, 97530- Therapeutic activity, [id number]- Neuromuscular re-education, 97535- Self Care, 40981- Manual therapy, [id number]- Gait training, [phone]- Orthotic Fit/training, [phone]- Canalith repositioning, [id number]- Aquatic Therapy, Patient/Family education, Dry Needling, Vestibular training, Visual/preceptual remediation/compensation, and DME instructions  PLAN FOR NEXT SESSION: TUG, HEP, Endurance. Did pt bring glucose monitor?    Jill Alexanders Romesha Scherer, PT, DPT  05/07/2023, 9:29 AM

## 2023-05-10 ENCOUNTER — Other Ambulatory Visit: Payer: Self-pay

## 2023-05-10 ENCOUNTER — Ambulatory Visit: Payer: Medicare HMO | Admitting: Physical Therapy

## 2023-05-10 VITALS — BP 111/81 | HR 90

## 2023-05-10 DIAGNOSIS — M6281 Muscle weakness (generalized): Secondary | ICD-10-CM | POA: Diagnosis not present

## 2023-05-10 DIAGNOSIS — R2689 Other abnormalities of gait and mobility: Secondary | ICD-10-CM | POA: Diagnosis not present

## 2023-05-10 DIAGNOSIS — Z789 Other specified health status: Secondary | ICD-10-CM | POA: Diagnosis not present

## 2023-05-10 DIAGNOSIS — R2681 Unsteadiness on feet: Secondary | ICD-10-CM

## 2023-05-10 DIAGNOSIS — Z7409 Other reduced mobility: Secondary | ICD-10-CM | POA: Diagnosis not present

## 2023-05-10 DIAGNOSIS — R262 Difficulty in walking, not elsewhere classified: Secondary | ICD-10-CM | POA: Diagnosis not present

## 2023-05-10 DIAGNOSIS — R293 Abnormal posture: Secondary | ICD-10-CM | POA: Diagnosis not present

## 2023-05-10 DIAGNOSIS — R278 Other lack of coordination: Secondary | ICD-10-CM | POA: Diagnosis not present

## 2023-05-10 NOTE — Therapy (Signed)
OUTPATIENT PHYSICAL THERAPY NEURO TREATMENT   Patient Name: Reginald Rodriguez MRN: [id number] DOB:1951/03/31, 72 y.o., male Today's Date: 05/10/2023   PCP: Bertram Denver, NP REFERRING PROVIDER: Shan Levans, MD  END OF SESSION:  PT End of Session - 05/10/23 0849     Visit Number 3    Number of Visits 17    Date for PT Re-Evaluation 06/25/23    Authorization Type [employer] medicare    Progress Note Due on Visit 10    PT Start Time 0848    PT Stop Time 0927    PT Time Calculation (min) 39 min    Equipment Utilized During Treatment Gait belt    Activity Tolerance Patient tolerated treatment well    Behavior During Therapy WFL for tasks assessed/performed              Past Medical History:  Diagnosis Date   Diabetes mellitus without complication (HCC)    Hypertension    Stroke (HCC)    No past surgical history on file. Patient Active Problem List   Diagnosis Date Noted   Impaired mobility and activities of daily living 03/02/2022   Stress incontinence 12/31/2021   Type 2 diabetes mellitus with diabetic polyneuropathy, with long-term current use of insulin (HCC) 03/13/2021   Type 2 diabetes mellitus with stage 3b chronic kidney disease, with long-term current use of insulin (HCC) 03/13/2021   Obesity, Class III, BMI 40-49.9 (morbid obesity) (HCC) 12/18/2020   Hyperlipidemia associated with type 2 diabetes mellitus (HCC) 12/17/2020   Tobacco use 06/27/2020   Fatty liver 05/13/2020   Arthritis:Knees, right shoulder 05/13/2020   COPD with emphysema (HCC) 05/13/2020   Erectile dysfunction 05/13/2020   OSA (obstructive sleep apnea) 05/13/2020   Onychomycosis of multiple toenails with type 2 diabetes mellitus (HCC) 05/13/2020   AAA (abdominal aortic aneurysm) (HCC) 05/13/2020   CKD (chronic kidney disease) stage 3, GFR 30-59 ml/min (HCC) 04/16/2020   Poor dentition 04/15/2020   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 Blood glucose at 113 mg/dL this morning. Denies falls or acute changes.   Pt accompanied by: self  PERTINENT HISTORY: HTN, DM, CVA  PAIN:  Are you having pain? No   PRECAUTIONS: Fall  WEIGHT BEARING RESTRICTIONS: No  FALLS: Has patient fallen in last 6 months? No  LIVING ENVIRONMENT: Lives with: lives alone Lives in: House/apartment Stairs: No Has following equipment at home: Single point cane, Environmental consultant - 2 wheeled, Environmental consultant - 4 wheeled, Wheelchair (power), and Grab bars  PLOF: Requires assistive device for independence, Needs assistance with ADLs, and Needs assistance with homemaking  PATIENT GOALS: To get stronger  OBJECTIVE:  Note: Objective measures were completed at Evaluation unless otherwise noted  COGNITION: Overall cognitive status: Impaired   SENSATION: WFL  COORDINATION: Limited by strength R LE  POSTURE: rounded shoulders, forward head, increased thoracic kyphosis, posterior pelvic tilt, and flexed trunk    LOWER EXTREMITY MMT:    MMT Right Eval Left Eval  Hip flexion 2 4  Hip extension    Hip abduction 2 4  Hip adduction 2 4  Hip internal rotation    Hip external rotation    Knee flexion 2 4  Knee extension 2 4  Ankle dorsiflexion 2 4  Ankle plantarflexion 2 4  Ankle inversion    Ankle eversion    (Blank  rows = not tested)  BED MOBILITY:  Able to complete independently, but with difficulty  TRANSFERS: Assistive device utilized: Environmental consultant - 4 wheeled  Sit to stand: Modified independence Stand to sit: Modified independence Chair to chair: Modified independence  VITALS  Vitals:   05/10/23 0852  BP: 111/81  Pulse: 90      TODAY'S TREATMENT:    Ther Act  Assessed vitals (see above) and WNL for therapy.    Ther Ex  SciFit multi-peaks level 4 for 8 minutes using BUE/BLEs for neural priming for reciprocal movement, dynamic cardiovascular warmup and increased amplitude of stepping. Pt able to place his feet in pedals independently on first attempt this date. RPE of 0/10 following activity Sit to stands x10 reps from 16" height w/no UE support for improved transfers, endurance and BLE strength. Pt required min encouraging cues to perform due to fear-avoidance behavior, but was able to perform well w/no instability noted. Added to HEP (see bolded below) w/emphasis on performing reps quickly w/no rest breaks in between reps. Pt reports he does not have a great place to perform at home but can brace his walker against a wall and perform from there.  Added walking program to HEP (see below) for improved endurance w/use of walker.     GAIT: Gait pattern: step through pattern, decreased stride length, Right foot flat, trendelenburg, trunk flexed, wide BOS, and poor foot clearance- Right Distance walked: Various clinic distances Assistive device utilized: Walker - 4 wheeled and None Level of assistance: Modified independence and CGA Comments: Pt able to ambulate short distances w/o AD this date w/wide BOS.    PATIENT EDUCATION: Education details: Initial HEP, importance of consistency and walking  Person educated: Patient Education method: Explanation, Demonstration, and Handouts Education comprehension: verbalized understanding, returned demonstration, and needs further education  HOME  EXERCISE PROGRAM: Access Code: [email or web address] URL: [email or web address] Date: 05/10/2023 Prepared by: Alethia Berthold Emberlee Sortino  Program Notes Walking Program: -Walk one lap in your hallway and then rest for 1-2 minutes -Repeat 3 times -Use your walker   Exercises - Sit to Stand Without Arm Support  - 1 x daily -  7 x weekly - 1-2 sets - 10 reps  GOALS: Goals reviewed with patient? Yes  SHORT TERM GOALS: Target date: 05/28/23  Pt will be independent with initial HEP for improved balance and functional strength  Baseline: Goal status: INITIAL  2.  Pt will improve 5x STS to </= 23 sec to demo improved functional LE strength and balance   Baseline: 28.25 Goal status: INITIAL  3.  Pt will improve gait speed to >/= .64m/s to demonstrate improved community ambulation  Baseline: .47m/s Goal status: INITIAL  4.  TUG goal Baseline: to be assessed Goal status: INITIAL   LONG TERM GOALS: Target date: 06/25/23  Pt will be independent with final HEP for improved balance and functional strength  Baseline: Goal status: INITIAL  Pt will improve 5x STS to </= 17 sec to demo improved functional LE strength and balance   Baseline: 28.25 Goal status: INITIAL  Pt will improve gait speed to >/= .34m/s to demonstrate improved community ambulation  Baseline: .38m/s Goal status: INITIAL  TUG goal Baseline: to be assessed Goal status: INITIAL  ASSESSMENT:  CLINICAL IMPRESSION: Emphasis of skilled PT session on endurance and establishing initial HEP. Due to extremely small space of current apartment and pt planning to move soon, therapist limited as to which exercises pt can safely perform at home. However, pt most limited by poor endurance and fear-avoidance behavior, so recommend pt focus on walking program in his hallway to build up functional strength and endurance. Continue POC.   OBJECTIVE IMPAIRMENTS: Abnormal gait, cardiopulmonary status limiting activity, decreased balance,  decreased coordination, decreased endurance, decreased knowledge of condition, decreased knowledge of use of DME, decreased strength, postural dysfunction, and pain.   ACTIVITY LIMITATIONS: carrying, lifting, bending, stairs, transfers, bed mobility, bathing, hygiene/grooming, locomotion level, and caring for others  PARTICIPATION LIMITATIONS: meal prep, cleaning, interpersonal relationship, driving, shopping, and community activity  PERSONAL FACTORS: Age, Fitness, Past/current experiences, Social background, Time since onset of injury/illness/exacerbation, Transportation, and 3+ comorbidities: see above  are also affecting patient's functional outcome.   REHAB POTENTIAL: Fair time since onset  CLINICAL DECISION MAKING: Stable/uncomplicated  EVALUATION COMPLEXITY: Low  PLAN:  PT FREQUENCY: 2x/week  PT DURATION: 8 weeks  PLANNED INTERVENTIONS: 97146- PT Re-evaluation, 97110-Therapeutic exercises, 97530- Therapeutic activity, 97112- Neuromuscular re-education, 97535- Self Care, 86578- Manual therapy, [phone]- Gait training, [phone]- Orthotic Fit/training, [phone]- Canalith repositioning, [phone]- Aquatic Therapy, Patient/Family education, Dry Needling, Vestibular training, Visual/preceptual remediation/compensation, and DME instructions  PLAN FOR NEXT SESSION: Is pt doing walking program? TUG and update goal, add to HEP as appropriate (pt will be moving into a 2 bedroom apartment soon), Endurance, functional strength    Daneshia Tavano E Cahlil Sattar, PT, DPT 05/10/2023, 9:28 AM

## 2023-05-11 NOTE — Congregational Nurse Program (Signed)
  Dept: [phone]   Congregational Nurse Program Note  Date of Encounter: 05/11/2023  Clinic visit to check blood pressure and weight, BP 129/84, weight 274 Lbs.  Endocrinology visit on 05/04/2023 with A1c down to 6.8%, insulin deceased to Novolog 30 units at breakfast, Tresiba 64 units in evening. No additional Novolog at other meals unless glucose level 161 or higher. Uses Freestyle Libre continuous glucose monitor and all blood glucose levels past week less than 160, glucose 113 this AM. Past Medical History: Past Medical History:  Diagnosis Date   Diabetes mellitus without complication (HCC)    Hypertension    Stroke [facility])     Encounter Details:

## 2023-05-12 ENCOUNTER — Telehealth: Payer: Self-pay

## 2023-05-12 NOTE — Telephone Encounter (Signed)
Copied from CRM [id number]. Topic: General - Other >> May 12, 2023 12:31 PM Jannifer Rodney M wrote: Reason for CRM: Latina with [facility] called for an update on the Rx for diabetic shoes. Cb# [phone]

## 2023-05-13 NOTE — Telephone Encounter (Signed)
Unable to reach patient by phone to relay results.  Voicemail left to return call for an appointment that was made for orders to be signed for shoes. Appointment made for 11/7 @10 :50am. If unable to make patient will need to reschedule.

## 2023-05-14 ENCOUNTER — Ambulatory Visit: Payer: Medicare HMO

## 2023-05-14 VITALS — BP 103/81

## 2023-05-14 DIAGNOSIS — R262 Difficulty in walking, not elsewhere classified: Secondary | ICD-10-CM

## 2023-05-14 DIAGNOSIS — Z7409 Other reduced mobility: Secondary | ICD-10-CM | POA: Diagnosis not present

## 2023-05-14 DIAGNOSIS — R2681 Unsteadiness on feet: Secondary | ICD-10-CM | POA: Diagnosis not present

## 2023-05-14 DIAGNOSIS — R2689 Other abnormalities of gait and mobility: Secondary | ICD-10-CM

## 2023-05-14 DIAGNOSIS — M6281 Muscle weakness (generalized): Secondary | ICD-10-CM

## 2023-05-14 DIAGNOSIS — R293 Abnormal posture: Secondary | ICD-10-CM

## 2023-05-14 DIAGNOSIS — R278 Other lack of coordination: Secondary | ICD-10-CM | POA: Diagnosis not present

## 2023-05-14 DIAGNOSIS — Z789 Other specified health status: Secondary | ICD-10-CM | POA: Diagnosis not present

## 2023-05-14 NOTE — Therapy (Signed)
OUTPATIENT PHYSICAL THERAPY NEURO TREATMENT   Patient Name: Reginald Rodriguez MRN: [id number] DOB:03/21/1951, 72 y.o., male Today's Date: 05/14/2023   PCP: Bertram Denver, NP REFERRING PROVIDER: Shan Levans, MD  END OF SESSION:  PT End of Session - 05/14/23 0835     Visit Number 4    Number of Visits 17    Date for PT Re-Evaluation 06/25/23    Authorization Type [employer] medicare    Progress Note Due on Visit 10    PT Start Time 0845    PT Stop Time 0928    PT Time Calculation (min) 43 min    Equipment Utilized During Treatment Gait belt    Activity Tolerance Patient tolerated treatment well    Behavior During Therapy WFL for tasks assessed/performed              Past Medical History:  Diagnosis Date   Diabetes mellitus without complication (HCC)    Hypertension    Stroke [facility])    History reviewed. No pertinent surgical history. Patient Active Problem List   Diagnosis Date Noted   Impaired mobility and activities of daily living 03/02/2022   Stress incontinence 12/31/2021   Type 2 diabetes mellitus with diabetic polyneuropathy, with long-term current use of insulin (HCC) 03/13/2021   Type 2 diabetes mellitus with stage 3b chronic kidney disease, with long-term current use of insulin (HCC) 03/13/2021   Obesity, Class III, BMI 40-49.9 (morbid obesity) (HCC) 12/18/2020   Hyperlipidemia associated with type 2 diabetes mellitus (HCC) 12/17/2020   Tobacco use 06/27/2020   Fatty liver 05/13/2020   Arthritis:Knees, right shoulder 05/13/2020   COPD with emphysema (HCC) 05/13/2020   Erectile dysfunction 05/13/2020   OSA (obstructive sleep apnea) 05/13/2020   Onychomycosis of multiple toenails with type 2 diabetes mellitus (HCC) 05/13/2020   AAA (abdominal aortic aneurysm) (HCC) 05/13/2020   CKD (chronic kidney disease) stage 3, GFR 30-59 ml/min (HCC) 04/16/2020   Poor dentition 04/15/2020   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 Doesn't have a move in date yet. BG 113. Denies falls.   Pt accompanied by: self  PERTINENT HISTORY: HTN, DM, CVA  PAIN:  Are you having pain? No  PATIENT GOALS: To get stronger   VITALS  Vitals:   05/14/23 0858  BP: 103/81       TODAY'S TREATMENT:    Ther Act  Assessed vitals (see above) and WNL for therapy.    THEREX: -scifit hills level 3 x10 mins B UE/LE -sit <> stand + 2kg ball toss to student PT x10   -progressed to standing on airex + ball toss x6   GAIT: -115' no AD + CGA   -BP: 117/96 afterward   [facility] PT Assessment - 05/14/23 0001       Timed Up and Go Test   Normal TUG (seconds) 15.6   with rollator              PATIENT EDUCATION: Education details: continue HEP Person educated: Patient Education method: Explanation, Demonstration, and Handouts Education comprehension: verbalized understanding, returned demonstration, and needs further education  HOME EXERCISE PROGRAM: Access Code: [email or web address] URL: [email or web address] Date: 05/10/2023 Prepared by: Alethia Berthold Plaster  Program Notes Walking Program: -Walk one lap in your hallway and then rest for 1-2 minutes -Repeat 3  times -Use your walker   Exercises - Sit to Stand Without Arm Support  - 1 x daily - 7 x weekly - 1-2 sets - 10 reps  GOALS: Goals reviewed with patient? Yes  SHORT TERM GOALS: Target date: 05/28/23  Pt will be independent with initial HEP for improved balance and functional strength  Baseline: Goal status: INITIAL  2.  Pt will improve 5x STS to </= 23 sec to demo improved functional LE strength and balance   Baseline: 28.25 Goal status: INITIAL  3.  Pt will improve gait speed to >/= .70m/s to demonstrate improved community ambulation  Baseline: .23m/s Goal status: INITIAL  4.  Pt will improve TUG to </= 13 secs to demonstrated reduced fall risk  Baseline: 15.6s with rollator Goal status: INITIAL   LONG TERM GOALS: Target date: 06/25/23  Pt will be independent with final HEP for improved balance and functional strength  Baseline: Goal status: INITIAL  Pt will improve 5x STS to </= 17 sec to demo improved functional LE strength and balance   Baseline: 28.25 Goal status: INITIAL  Pt will improve gait speed to >/= .29m/s to demonstrate improved community ambulation  Baseline: .69m/s Goal status: INITIAL  Pt will improve TUG to </= 11 secs to demonstrated reduced fall risk Baseline: 15.6s with rollator Goal status: INITIAL  ASSESSMENT:  CLINICAL IMPRESSION: Patient seen for skilled PT session with emphasis on functional strengthening and power. Patient completed the Timed Up and Go test (TUG) in 15.6 seconds.  Geriatrics: need for further assessment of fall risk: >= 12 sec; Recurrent falls: > 15 sec; Vestibular Disorders fall risk: > 15 sec; Parkinson's Disease fall risk: > 16 sec ([email or web address], 2023). Progressing well with general endurance and power. B hip weakness resulting in trendelenburg gait pattern. Continue POC.    OBJECTIVE IMPAIRMENTS: Abnormal gait, cardiopulmonary status limiting activity, decreased balance, decreased coordination, decreased endurance,  decreased knowledge of condition, decreased knowledge of use of DME, decreased strength, postural dysfunction, and pain.   ACTIVITY LIMITATIONS: carrying, lifting, bending, stairs, transfers, bed mobility, bathing, hygiene/grooming, locomotion level, and caring for others  PARTICIPATION LIMITATIONS: meal prep, cleaning, interpersonal relationship, driving, shopping, and community activity  PERSONAL FACTORS: Age, Fitness, Past/current experiences, Social background, Time since onset of injury/illness/exacerbation, Transportation, and 3+ comorbidities: see above  are also affecting patient's functional outcome.   REHAB POTENTIAL: Fair time since onset  CLINICAL DECISION MAKING: Stable/uncomplicated  EVALUATION COMPLEXITY: Low  PLAN:  PT FREQUENCY: 2x/week  PT DURATION: 8 weeks  PLANNED INTERVENTIONS: 97146- PT Re-evaluation, 97110-Therapeutic exercises, 97530- Therapeutic activity, 97112- Neuromuscular re-education, 97535- Self Care, 57846- Manual therapy, [phone]- Gait training, [phone]- Orthotic Fit/training, [phone]- Canalith repositioning, [phone]- Aquatic Therapy, Patient/Family education, Dry Needling, Vestibular training, Visual/preceptual remediation/compensation, and DME instructions  PLAN FOR NEXT SESSION: Is pt doing walking program? TUG and update goal, add to HEP as appropriate (pt will be moving into a 2 bedroom apartment soon), Endurance, functional strength    Westley Foots, PT Westley Foots, PT, DPT, CBIS  05/14/2023, 9:40 AM

## 2023-05-17 ENCOUNTER — Ambulatory Visit: Payer: Medicare HMO

## 2023-05-17 DIAGNOSIS — M6281 Muscle weakness (generalized): Secondary | ICD-10-CM

## 2023-05-17 DIAGNOSIS — R2681 Unsteadiness on feet: Secondary | ICD-10-CM

## 2023-05-17 DIAGNOSIS — Z789 Other specified health status: Secondary | ICD-10-CM | POA: Diagnosis not present

## 2023-05-17 DIAGNOSIS — R262 Difficulty in walking, not elsewhere classified: Secondary | ICD-10-CM | POA: Diagnosis not present

## 2023-05-17 DIAGNOSIS — Z7409 Other reduced mobility: Secondary | ICD-10-CM

## 2023-05-17 DIAGNOSIS — R293 Abnormal posture: Secondary | ICD-10-CM | POA: Diagnosis not present

## 2023-05-17 DIAGNOSIS — R278 Other lack of coordination: Secondary | ICD-10-CM | POA: Diagnosis not present

## 2023-05-17 DIAGNOSIS — R2689 Other abnormalities of gait and mobility: Secondary | ICD-10-CM | POA: Diagnosis not present

## 2023-05-17 NOTE — Therapy (Signed)
OUTPATIENT PHYSICAL THERAPY NEURO TREATMENT   Patient Name: Reginald Rodriguez MRN: [id number] DOB:[birth year], 72 y.o., male Today's Date: 05/17/2023   PCP: Bertram Denver, NP REFERRING PROVIDER: Shan Levans, MD  END OF SESSION:  PT End of Session - 05/17/23 0848     Visit Number 4    Number of Visits 17    Date for PT Re-Evaluation 06/25/23    Authorization Type [employer] medicare    Progress Note Due on Visit 10    PT Start Time 0849    PT Stop Time 0935    PT Time Calculation (min) 46 min    Equipment Utilized During Treatment Gait belt    Activity Tolerance Treatment limited secondary to medical complications (Comment)   low blood sugar             Past Medical History:  Diagnosis Date   Diabetes mellitus without complication (HCC)    Hypertension    Stroke [facility])    History reviewed. No pertinent surgical history. Patient Active Problem List   Diagnosis Date Noted   Impaired mobility and activities of daily living 03/02/2022   Stress incontinence 12/31/2021   Type 2 diabetes mellitus with diabetic polyneuropathy, with long-term current use of insulin (HCC) 03/13/2021   Type 2 diabetes mellitus with stage 3b chronic kidney disease, with long-term current use of insulin (HCC) 03/13/2021   Obesity, Class III, BMI 40-49.9 (morbid obesity) (HCC) 12/18/2020   Hyperlipidemia associated with type 2 diabetes mellitus (HCC) 12/17/2020   Tobacco use 06/27/2020   Fatty liver 05/13/2020   Arthritis:Knees, right shoulder 05/13/2020   COPD with emphysema (HCC) 05/13/2020   Erectile dysfunction 05/13/2020   OSA (obstructive sleep apnea) 05/13/2020   Onychomycosis of multiple toenails with type 2 diabetes mellitus (HCC) 05/13/2020   AAA (abdominal aortic aneurysm) (HCC) 05/13/2020   CKD (chronic kidney disease) stage 3, GFR 30-59 ml/min (HCC) 04/16/2020   Poor dentition 04/15/2020   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 Blood sugar 85 this morning and 93 at beginning of session.   Pt accompanied by: self  PERTINENT HISTORY: HTN, DM, CVA  PAIN:  Are you having pain? No  PATIENT GOALS: To get stronger   VITALS   133/97 beginning of session        TODAY'S TREATMENT:    Ther Act  Assessed vitals (see above) and WNL for therapy.    GAIT: -230' no AD + CGA x 2  -BP: 125/84 afterward   - 2 min 13 sec 1st set  - 2 min 44 sec 2nd set   Hurdles 4" in parallel bars  -6 laps   - blood sugar 72, 63, 59, 63        PATIENT EDUCATION: Education details: continue HEP Person educated: Patient Education method: Explanation, Demonstration, and Handouts Education comprehension: verbalized understanding, returned demonstration, and needs further education  HOME EXERCISE PROGRAM: Access Code: [email or web address] URL: [email or web address] Date: 05/10/2023 Prepared by: Alethia Berthold Plaster  Program Notes Walking Program: -Walk one lap in your hallway and then rest for 1-2 minutes -Repeat 3 times -Use your walker   Exercises - Sit to Stand Without Arm Support  - 1 x daily - 7 x weekly - 1-2 sets - 10 reps  GOALS: Goals reviewed with patient?  Yes  SHORT TERM GOALS: Target date: 05/28/23  Pt will be independent with initial HEP for improved balance and functional strength  Baseline: Goal status: INITIAL  2.  Pt will improve 5x STS to </= 23 sec to demo improved functional LE strength and balance   Baseline: 28.25 Goal status: INITIAL  3.  Pt will improve gait speed to >/= .80m/s to demonstrate improved community ambulation  Baseline: .97m/s Goal status: INITIAL  4.  Pt will improve TUG to </= 13 secs to demonstrated reduced fall risk  Baseline: 15.6s with rollator Goal status: INITIAL   LONG TERM GOALS: Target date: 06/25/23  Pt will be independent with final HEP for improved balance and functional strength  Baseline: Goal status: INITIAL  Pt will improve 5x STS to </= 17 sec to demo improved functional LE strength and balance   Baseline: 28.25 Goal status: INITIAL  Pt will improve gait speed to >/= .19m/s to demonstrate improved community ambulation  Baseline: .63m/s Goal status: INITIAL  Pt will improve TUG to </= 11 secs to demonstrated reduced fall risk Baseline: 15.6s with rollator Goal status: INITIAL  ASSESSMENT:  CLINICAL IMPRESSION: Pt. seen for skilled PT session with emphasis on endurance training and R LE strength and function. Pt. Did well with gait training without AD but continues to need endurance training as he fatigues quickly, within 2 to 3 min. Did well with hurdle exercise to increase hip flexion and foot clearance. Required parallel bars for support and verbal cueing to avoid circumduction. Had to end session early due to blood sugar levels, provided pt. With crackers, candy and soda and reminder to continue checking glucose monitor. Caregiver not willing to stay to wait for blood sugar to go up, educated pt. To go to urgent care or ER if blood sugar does not reach at least 75 by the time his caregiver has to leave his home. Continue POC.    OBJECTIVE IMPAIRMENTS: Abnormal gait,  cardiopulmonary status limiting activity, decreased balance, decreased coordination, decreased endurance, decreased knowledge of condition, decreased knowledge of use of DME, decreased strength, postural dysfunction, and pain.   ACTIVITY  LIMITATIONS: carrying, lifting, bending, stairs, transfers, bed mobility, bathing, hygiene/grooming, locomotion level, and caring for others  PARTICIPATION LIMITATIONS: meal prep, cleaning, interpersonal relationship, driving, shopping, and community activity  PERSONAL FACTORS: Age, Fitness, Past/current experiences, Social background, Time since onset of injury/illness/exacerbation, Transportation, and 3+ comorbidities: see above  are also affecting patient's functional outcome.   REHAB POTENTIAL: Fair time since onset  CLINICAL DECISION MAKING: Stable/uncomplicated  EVALUATION COMPLEXITY: Low  PLAN:  PT FREQUENCY: 2x/week  PT DURATION: 8 weeks  PLANNED INTERVENTIONS: 97146- PT Re-evaluation, 97110-Therapeutic exercises, 97530- Therapeutic activity, 97112- Neuromuscular re-education, 97535- Self Care, 16109- Manual therapy, [phone]- Gait training, [phone]- Orthotic Fit/training, [phone]- Canalith repositioning, [phone]- Aquatic Therapy, Patient/Family education, Dry Needling, Vestibular training, Visual/preceptual remediation/compensation, and DME instructions  PLAN FOR NEXT SESSION: Is pt doing walking program? TUG and update goal, add to HEP as appropriate (pt will be moving into a 2 bedroom apartment soon), Endurance, functional strength    Memori Sammon M Kyjuan Gause, SPT   05/17/2023, 9:53 AM

## 2023-05-18 NOTE — Congregational Nurse Program (Signed)
  Dept: [phone]   Congregational Nurse Program Note  Date of Encounter: 05/18/2023  Clinic visit to check blood pressure and weight, BP 120/67, pulse 94 and regular, O2 sat 96%.  Weight 270 Lbs, states blood glucose PC lunch 144, all blood glucose levels in the afternoon after lunch or before dinner have been below 160.  Has not taken any afternoon insulin this month.  AM blood glucose levels per [employer] 100 or below.  Had low blood glucose of 58 while at PT treatment yesterday.  Reviewed intake and explained relationship of exercise to lower blood glucose and that he should have more calories at breakfast on days that he goes to PT treatment. Past Medical History: Past Medical History:  Diagnosis Date   Diabetes mellitus without complication (HCC)    Hypertension    Stroke [facility])     Encounter Details:  Community Questionnaire - 05/18/23 1445       Questionnaire   Ask client: Do you give verbal consent for me to treat you today? Yes    Student Assistance N/A    [place]    Encounter Setting CN site    Population Status Unknown   Has apartment at [facility] Medicare;Medicaid    Insurance/Financial Assistance Referral N/A    Medication N/A    Medical Provider Yes    Screening Referrals Made N/A    Medical Referrals Made N/A    Medical Appointment Completed N/A    CNP Interventions Counsel;Advocate/Support;Educate;Reviewed Medications    Screenings CN Performed Blood Pressure;Weight    ED Visit Averted N/A    Life-Saving Intervention Made N/A      Questionnaire   Do you give verbal consent to treat you today? Yes    Location Patient Served  Not Applicable    Visit Setting Church or Organization    Patient Status Unknown    Insurance Medicare    Insurance Referral N/A    Medication N/A    Screening Referrals N/A    Housing/Utilities N/A    Intervention Blood pressure;Blood glucose;Counsel;Educate

## 2023-05-21 ENCOUNTER — Ambulatory Visit: Payer: Medicare HMO | Admitting: Physical Therapy

## 2023-05-25 NOTE — Congregational Nurse Program (Signed)
  Dept: [phone]   Congregational Nurse Program Note  Date of Encounter: 05/25/2023  Clinic visit to check blood pressure and weight, BP 127/80, pulse 98 and regular, O2 Sat 98%. Weight 270 Lbs, discussed continuing physical therapy and meal plans to manage weight loss. Past Medical History: Past Medical History:  Diagnosis Date   Diabetes mellitus without complication (HCC)    Hypertension    Stroke [facility])     Encounter Details:  Community Questionnaire - 05/25/23 1450       Questionnaire   Ask client: Do you give verbal consent for me to treat you today? Yes    Student Assistance N/A    [place]    Encounter Setting CN site    Population Status Unknown   Has apartment at [facility] Medicare;Medicaid    Insurance/Financial Assistance Referral N/A    Medication N/A    Medical Provider Yes    Screening Referrals Made N/A    Medical Referrals Made N/A    Medical Appointment Completed N/A    CNP Interventions Counsel;Advocate/Support;Educate;Spiritual Care    Screenings CN Performed Blood Pressure;Weight    ED Visit Averted N/A    Life-Saving Intervention Made N/A      Questionnaire   Do you give verbal consent to treat you today? Yes    Location Patient Served  Not Applicable    Visit Setting Church or Organization    Patient Status Unknown    Insurance Medicare    Insurance Referral N/A    Medication N/A    Screening Referrals N/A    Housing/Utilities N/A    Intervention Blood pressure;Blood glucose;Counsel;Educate

## 2023-05-26 ENCOUNTER — Other Ambulatory Visit: Payer: Self-pay | Admitting: Nurse Practitioner

## 2023-05-26 ENCOUNTER — Ambulatory Visit: Payer: Medicare HMO | Attending: Nurse Practitioner | Admitting: Physical Therapy

## 2023-05-26 ENCOUNTER — Other Ambulatory Visit: Payer: Self-pay

## 2023-05-26 VITALS — BP 93/76 | HR 103

## 2023-05-26 DIAGNOSIS — Z7409 Other reduced mobility: Secondary | ICD-10-CM | POA: Diagnosis not present

## 2023-05-26 DIAGNOSIS — M6281 Muscle weakness (generalized): Secondary | ICD-10-CM | POA: Diagnosis not present

## 2023-05-26 DIAGNOSIS — R293 Abnormal posture: Secondary | ICD-10-CM | POA: Insufficient documentation

## 2023-05-26 DIAGNOSIS — R2689 Other abnormalities of gait and mobility: Secondary | ICD-10-CM

## 2023-05-26 DIAGNOSIS — R2681 Unsteadiness on feet: Secondary | ICD-10-CM

## 2023-05-26 DIAGNOSIS — R262 Difficulty in walking, not elsewhere classified: Secondary | ICD-10-CM | POA: Insufficient documentation

## 2023-05-26 DIAGNOSIS — Z789 Other specified health status: Secondary | ICD-10-CM | POA: Insufficient documentation

## 2023-05-26 NOTE — Therapy (Signed)
OUTPATIENT PHYSICAL THERAPY NEURO TREATMENT   Patient Name: Reginald Rodriguez MRN: [id number] DOB:January 11, 1951, 72 y.o., male Today's Date: 05/26/2023   PCP: Bertram Denver, NP REFERRING PROVIDER: Shan Levans, MD  END OF SESSION:  PT End of Session - 05/26/23 0853     Visit Number 5    Number of Visits 17    Date for PT Re-Evaluation 06/25/23    Authorization Type [employer] medicare    Progress Note Due on Visit 10    PT Start Time [phone]    PT Stop Time 0930    PT Time Calculation (min) 39 min    Equipment Utilized During Treatment Gait belt    Activity Tolerance Treatment limited secondary to medical complications (Comment)   Drop in blood sugar   Behavior During Therapy WFL for tasks assessed/performed               Past Medical History:  Diagnosis Date   Diabetes mellitus without complication (HCC)    Hypertension    Stroke (HCC)    No past surgical history on file. Patient Active Problem List   Diagnosis Date Noted   Impaired mobility and activities of daily living 03/02/2022   Stress incontinence 12/31/2021   Type 2 diabetes mellitus with diabetic polyneuropathy, with long-term current use of insulin (HCC) 03/13/2021   Type 2 diabetes mellitus with stage 3b chronic kidney disease, with long-term current use of insulin (HCC) 03/13/2021   Obesity, Class III, BMI 40-49.9 (morbid obesity) (HCC) 12/18/2020   Hyperlipidemia associated with type 2 diabetes mellitus (HCC) 12/17/2020   Tobacco use 06/27/2020   Fatty liver 05/13/2020   Arthritis:Knees, right shoulder 05/13/2020   COPD with emphysema (HCC) 05/13/2020   Erectile dysfunction 05/13/2020   OSA (obstructive sleep apnea) 05/13/2020   Onychomycosis of multiple toenails with type 2 diabetes mellitus (HCC) 05/13/2020   AAA (abdominal aortic aneurysm) (HCC) 05/13/2020   CKD (chronic kidney disease) stage 3, GFR 30-59 ml/min (HCC) 04/16/2020   Poor dentition 04/15/2020   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 Blood sugar is 102 mg/dL currently. Denies acute changes or falls   Pt accompanied by: self  PERTINENT HISTORY: HTN, DM, CVA  PAIN:  Are you having pain? No  PATIENT GOALS: To get stronger   VITALS   Vitals:   05/26/23 0857 05/26/23 0916  BP: 96/69 93/76  Pulse: (!) 101 (!) 103      TODAY'S TREATMENT:    Ther Act  Assessed vitals (see above) and BP in lower range this date but WNL for therapy.  Provided pt w/water as he reports he did not drink anything today.   Ther Ex  Farmers carries, 2x115', holding 15# KB in single hand for improved endurance, core strength and BLE strength. Pt required lengthy seated rest break between sets due to fatigue. RPE of 7-8/10, SBA throughout for safety. Blood glucose at 98 mg/dL and BP 11/91 mmHg and HR 103 bpm following activity.  Sit to stands w/10# slam ball throws, x9 reps, for improved BLE strength, anterior weight shifting and endurance. Pt performed well w/no LOB but was fearful of catching ball due to RUE weakness. Pt reported SOB w/activity, HR at 127 bpm and SpO2 at 96% following activity. Blood glucose at 83 mg/dL, so provided pt w/[employer] to assist w/blood sugar prior to leaving session. Encouraged pt to continue to closely  monitor this at home, pt verbalized understanding     PATIENT EDUCATION: Education details: continue HEP, monitoring glucose at home Person educated: Patient Education method: Explanation Education comprehension: verbalized understanding and needs further education  HOME EXERCISE PROGRAM: Access Code: [email or web address] URL: [email or web address] Date: 05/10/2023 Prepared by: Alethia Berthold Alrick Cubbage  Program Notes Walking Program: -Walk one lap in your hallway and then rest for 1-2 minutes -Repeat 3 times -Use your walker   Exercises - Sit to Stand Without Arm Support  - 1 x daily - 7 x weekly - 1-2 sets - 10 reps  GOALS: Goals reviewed with patient? Yes  SHORT TERM GOALS: Target date: 05/28/23  Pt will be independent with initial HEP for improved balance and functional strength  Baseline: Goal status: INITIAL  2.  Pt will improve 5x STS to </= 23 sec to demo improved functional LE strength and balance   Baseline: 28.25 Goal status: INITIAL  3.  Pt will improve gait speed to >/= .14m/s to demonstrate improved community ambulation  Baseline: .38m/s Goal status: INITIAL  4.  Pt will improve TUG to </= 13 secs to demonstrated reduced fall risk  Baseline: 15.6s with rollator Goal status: INITIAL   LONG TERM GOALS: Target date: 06/25/23  Pt will be independent with final HEP for improved balance and functional strength  Baseline: Goal status: INITIAL  Pt will improve 5x STS to </= 17 sec to demo improved functional LE strength and balance   Baseline: 28.25 Goal status: INITIAL  Pt will improve gait speed to >/= .29m/s to demonstrate improved community ambulation  Baseline: .14m/s Goal status: INITIAL  Pt will improve TUG to </= 11 secs to demonstrated reduced fall risk Baseline: 15.6s with rollator Goal status: INITIAL  ASSESSMENT:  CLINICAL IMPRESSION: Emphasis of skilled PT session on functional strength and endurance. Session limited as pt reported feeling SOB  and blood sugar dropped w/exercise. Pt's BP low and HR more elevated compared to baseline today as well. Pt required extra seated rest breaks this date but did perform exercise well w/no instability noted. Continue POC.    OBJECTIVE IMPAIRMENTS: Abnormal gait,  cardiopulmonary status limiting activity, decreased balance, decreased coordination, decreased endurance, decreased knowledge of condition, decreased knowledge of use of DME, decreased strength, postural dysfunction, and pain.   ACTIVITY LIMITATIONS: carrying, lifting, bending, stairs, transfers, bed mobility, bathing, hygiene/grooming, locomotion level, and caring for others  PARTICIPATION LIMITATIONS: meal prep, cleaning, interpersonal relationship, driving, shopping, and community activity  PERSONAL FACTORS: Age, Fitness, Past/current experiences, Social background, Time since onset of injury/illness/exacerbation, Transportation, and 3+ comorbidities: see above  are also affecting patient's functional outcome.   REHAB POTENTIAL: Fair time since onset  CLINICAL DECISION MAKING: Stable/uncomplicated  EVALUATION COMPLEXITY: Low  PLAN:  PT FREQUENCY: 2x/week  PT DURATION: 8 weeks  PLANNED INTERVENTIONS: 97146- PT Re-evaluation, 97110-Therapeutic exercises, 97530- Therapeutic activity, [id number]- Neuromuscular re-education, 97535- Self Care, 16109- Manual therapy, [id number]- Gait training, [phone]- Orthotic Fit/training, [phone]- Canalith repositioning, [phone]- Aquatic Therapy, Patient/Family education, Dry Needling, Vestibular training, Visual/preceptual remediation/compensation, and DME instructions  PLAN FOR NEXT SESSION: Check STG. Monitor BP and blood glucose. Is pt doing walking program? add to HEP as appropriate (pt will be moving into a 2 bedroom apartment soon), Endurance, functional strength   Collyns Mcquigg E Harrie Cazarez, PT, DPT  05/26/2023, 10:32 AM

## 2023-05-26 NOTE — Telephone Encounter (Signed)
Latina with [facility] called to f/u on Rx for diabetic shoes. Per notes below, advised pt was contacted to schedule an appointment.  Latina verbalized understanding.

## 2023-05-27 ENCOUNTER — Encounter: Payer: Self-pay | Admitting: Family Medicine

## 2023-05-27 ENCOUNTER — Ambulatory Visit: Payer: Medicare HMO | Attending: Family Medicine | Admitting: Family Medicine

## 2023-05-27 ENCOUNTER — Other Ambulatory Visit (HOSPITAL_COMMUNITY): Payer: Self-pay

## 2023-05-27 ENCOUNTER — Other Ambulatory Visit: Payer: Self-pay

## 2023-05-27 VITALS — BP 115/82 | HR 97 | Ht 67.0 in

## 2023-05-27 DIAGNOSIS — I10 Essential (primary) hypertension: Secondary | ICD-10-CM | POA: Diagnosis not present

## 2023-05-27 DIAGNOSIS — E1122 Type 2 diabetes mellitus with diabetic chronic kidney disease: Secondary | ICD-10-CM | POA: Diagnosis not present

## 2023-05-27 DIAGNOSIS — Z794 Long term (current) use of insulin: Secondary | ICD-10-CM | POA: Diagnosis not present

## 2023-05-27 DIAGNOSIS — N1832 Chronic kidney disease, stage 3b: Secondary | ICD-10-CM | POA: Diagnosis not present

## 2023-05-27 DIAGNOSIS — M545 Low back pain, unspecified: Secondary | ICD-10-CM | POA: Diagnosis not present

## 2023-05-27 MED ORDER — GABAPENTIN 100 MG PO CAPS
100.0000 mg | ORAL_CAPSULE | Freq: Two times a day (BID) | ORAL | 1 refills | Status: DC
Start: 2023-05-27 — End: 2023-09-09
  Filled 2023-05-27 (×2): qty 180, 90d supply, fill #0

## 2023-05-27 NOTE — Progress Notes (Signed)
Subjective:  Patient ID: Reginald Rodriguez, male    DOB: [birth year]  Age: 72 y.o. MRN: [id number]  CC: Diabetes (Needs office notes for diabetic shoes/)   HPI Reginald Rodriguez is a 72 y.o. year old male with a history of type 2 diabetes mellitus, previous CVA, hypertension, AAA, obesity, OSA, COPD.   Interval History: Discussed the use of AI scribe software for clinical note transcription with the patient, who gave verbal consent to proceed.  He presents for a form completion for diabetic shoes. He reports his diabetes is 'better' with a recent A1c of 6.8. He denies any numbness, sores, circulation problems, deformities, or amputations on his feet. He also denies any swelling in his lower extremities. The patient's blood pressure was noted to be high during the visit, which he attributes to smoking a cigarette prior to the appointment.   His diabetes is managed by endocrine with his last A1c of 6.8    Past Medical History:  Diagnosis Date   Diabetes mellitus without complication (HCC)    Hypertension    Stroke (HCC)     No past surgical history on file.  No family history on file.  Social History   Socioeconomic History   Marital status: Single    Spouse name: Not on file   Number of children: Not on file   Years of education: Not on file   Highest education level: Not on file  Occupational History   Not on file  Tobacco Use   Smoking status: Every Day    Current packs/day: 1.00    Average packs/day: 1 pack/day for 46.0 years (46.0 ttl pk-yrs)    Types: Cigarettes   Smokeless tobacco: Former  Building services engineer status: Never Used  Substance and Sexual Activity   Alcohol use: Not Currently   Drug use: Not Currently   Sexual activity: Not on file  Other Topics Concern   Not on file  Social History Narrative   Not on file   Social Determinants of Health   Financial Resource Strain: Low Risk  (09/17/2022)   Overall Financial Resource Strain (CARDIA)    Difficulty of  Paying Living Expenses: Not hard at all  Food Insecurity: No Food Insecurity (09/17/2022)   Hunger Vital Sign    Worried About Running Out of Food in the Last Year: Never true    Ran Out of Food in the Last Year: Never true  Transportation Needs: No Transportation Needs (09/17/2022)   PRAPARE - Administrator, Civil Service (Medical): No    Lack of Transportation (Non-Medical): No  Physical Activity: Inactive (09/17/2022)   Exercise Vital Sign    Days of Exercise per Week: 0 days    Minutes of Exercise per Session: 0 min  Stress: No Stress Concern Present (09/17/2022)   [employer] of Occupational Health - Occupational Stress Questionnaire    Feeling of Stress : Not at all  Social Connections: Not on file    Allergies  Allergen Reactions   Bee Venom Swelling   Penicillins Rash    Outpatient Medications Prior to Visit  Medication Sig Dispense Refill   Accu-Chek Softclix Lancets lancets Use to check blood sugar 3 times daily. 100 each 2   amLODipine (NORVASC) 5 MG tablet TAKE 1 TABLET EVERY DAY 90 tablet 3   ammonium lactate (AMLACTIN) 12 % cream Apply 1 Application topically as needed for dry skin. 385 g 5   aspirin EC 81 MG tablet Take 1 tablet (  81 mg total) by mouth daily. 100 tablet 1   atorvastatin (LIPITOR) 10 MG tablet Take 1 tablet (10 mg total) by mouth daily. 90 tablet 3   Blood Glucose Monitoring Suppl (ACCU-CHEK GUIDE) w/Device KIT Use to check blood sugar 3 times daily. 1 kit 0   chlorthalidone (HYGROTON) 25 MG tablet TAKE 1 TABLET EVERY DAY 90 tablet 1   Continuous Glucose Sensor (FREESTYLE LIBRE 2 SENSOR) MISC Use to check blood sugar continuously throughout the day. Change sensors once every 14 days. E11.22 6 each 2   dapagliflozin propanediol (FARXIGA) 10 MG TABS tablet Take 1 tablet (10 mg total) by mouth daily before breakfast. 90 tablet 3   diclofenac Sodium (VOLTAREN) 1 % GEL Apply 4 g topically 4 (four) times daily. 100 g 0   fenofibrate  micronized (LOFIBRA) 134 MG capsule Take 1 capsule (134 mg total) by mouth daily before breakfast. 90 capsule 3   glucose blood (ACCU-CHEK GUIDE) test strip 1 each by Other route 3 (three) times daily. Use to check blood sugar 3 times daily. 300 each 3   insulin aspart (NOVOLOG FLEXPEN) 100 UNIT/ML FlexPen Max daily 75units 75 mL 3   insulin degludec (TRESIBA FLEXTOUCH) 200 UNIT/ML FlexTouch Pen Inject 64 Units into the skin daily in the afternoon. 45 mL 3   Insulin Pen Needle 31G X 5 MM MISC Use in the morning, at noon, in the evening, and at bedtime. 400 each 3   lactulose, encephalopathy, (CHRONULAC) 10 GM/15ML SOLN Take 15 mLs (10 g total) by mouth daily as needed (constipation). 480 mL 3   lidocaine (LIDODERM) 5 % Place 1 patch onto the skin daily. Remove & Discard patch within 12 hours or as directed by MD 15 patch 0   lisinopril (ZESTRIL) 2.5 MG tablet Take 1 tablet (2.5 mg total) by mouth daily. 90 tablet 3   methocarbamol (ROBAXIN) 750 MG tablet Take 1 tablet (750 mg total) by mouth every 8 (eight) hours as needed for muscle spasms. 60 tablet 1   nicotine (NICODERM CQ) 21 mg/24hr patch Place 1 patch (21 mg total) onto the skin daily. 28 patch 0   pioglitazone (ACTOS) 30 MG tablet Take 1 tablet (30 mg total) by mouth daily. 90 tablet 3   gabapentin (NEURONTIN) 100 MG capsule Take 1 capsule (100 mg total) by mouth 2 (two) times daily. Please mail 180 capsule 1   Facility-Administered Medications Prior to Visit  Medication Dose Route Frequency Provider Last Rate Last Admin   glucose chewable tablet 4 g  1 tablet Oral Once Storm Frisk, MD         ROS Review of Systems  Constitutional:  Negative for activity change and appetite change.  HENT:  Negative for sinus pressure and sore throat.   Respiratory:  Negative for chest tightness, shortness of breath and wheezing.   Cardiovascular:  Negative for chest pain and palpitations.  Gastrointestinal:  Negative for abdominal distention,  abdominal pain and constipation.  Genitourinary: Negative.   Musculoskeletal: Negative.   Psychiatric/Behavioral:  Negative for behavioral problems and dysphoric mood.     Objective:  BP 115/82 (BP Location: Right Arm, Patient Position: Sitting, Cuff Size: Large)   Pulse 97   Ht 5\' 7"  (1.702 m)   SpO2 97%   BMI 42.29 kg/m      05/27/2023   11:08 AM 05/27/2023   10:31 AM 05/26/2023    9:16 AM  BP/Weight  Systolic BP 115 130 93  Diastolic BP 82 95  76      Physical Exam Constitutional:      Appearance: He is well-developed. He is obese.  Cardiovascular:     Rate and Rhythm: Normal rate.     Heart sounds: Normal heart sounds. No murmur heard. Pulmonary:     Effort: Pulmonary effort is normal.     Breath sounds: Normal breath sounds. No wheezing or rales.  Chest:     Chest wall: No tenderness.  Abdominal:     General: Bowel sounds are normal. There is no distension.     Palpations: Abdomen is soft. There is no mass.     Tenderness: There is no abdominal tenderness.  Musculoskeletal:        General: Normal range of motion.     Right lower leg: No edema.     Left lower leg: No edema.  Neurological:     Mental Status: He is alert and oriented to person, place, and time.  Psychiatric:        Mood and Affect: Mood normal.    Declines foot exam    Latest Ref Rng & Units 03/17/2023    3:52 PM 10/22/2022   11:33 AM 05/23/2022    4:18 AM  CMP  Glucose 70 - 99 mg/dL 95  85  161   BUN 8 - 23 mg/dL 29  27  23    Creatinine 0.61 - 1.24 mg/dL 0.96  0.45  4.09   Sodium 135 - 145 mmol/L 138  141  141   Potassium 3.5 - 5.1 mmol/L 3.3  3.7  3.5   Chloride 98 - 111 mmol/L 103  99  102   CO2 22 - 32 mmol/L 22  24  28    Calcium 8.9 - 10.3 mg/dL 9.7  81.1  9.8   Total Protein 6.5 - 8.1 g/dL 7.0  7.4    Total Bilirubin 0.3 - 1.2 mg/dL 0.2  0.3    Alkaline Phos 38 - 126 U/L 44  46    AST 15 - 41 U/L 26  26    ALT 0 - 44 U/L 24  24      Lipid Panel     Component Value Date/Time    CHOL 133 10/22/2022 1133   TRIG 180 (H) 10/22/2022 1133   HDL 31 (L) 10/22/2022 1133   CHOLHDL 4.3 10/22/2022 1133   LDLCALC 71 10/22/2022 1133    CBC    Component Value Date/Time   WBC 8.2 03/17/2023 1552   RBC 4.73 03/17/2023 1552   HGB 13.9 03/17/2023 1552   HGB 15.8 12/17/2020 0915   HCT 42.9 03/17/2023 1552   HCT 47.9 12/17/2020 0915   PLT 258 03/17/2023 1552   PLT 295 12/17/2020 0915   MCV 90.7 03/17/2023 1552   MCV 91 12/17/2020 0915   MCH 29.4 03/17/2023 1552   MCHC 32.4 03/17/2023 1552   RDW 16.0 (H) 03/17/2023 1552   RDW 13.7 12/17/2020 0915   LYMPHSABS 3.4 03/17/2023 1552   LYMPHSABS 3.4 (H) 12/17/2020 0915   MONOABS 0.9 03/17/2023 1552   EOSABS 0.4 03/17/2023 1552   EOSABS 0.3 12/17/2020 0915   BASOSABS 0.1 03/17/2023 1552   BASOSABS 0.1 12/17/2020 0915    Lab Results  Component Value Date   HGBA1C 6.8 (A) 05/04/2023    Assessment & Plan:      Type 2 Diabetes Mellitus Well-controlled with an A1c of 6.8. No reported neuropathy or foot complications. -Continue management per endocrine -Complete and submit form for diabetic  shoes.  Hypertension Elevated diastolic blood pressure noted during visit, possibly related to recent smoking. -Repeat blood pressure measurement returned normal  General Health Maintenance -Continue current management of COPD and sleep apnea. -Continue monitoring for toenail fungus.          Meds ordered this encounter  Medications   gabapentin (NEURONTIN) 100 MG capsule    Sig: Take 1 capsule (100 mg total) by mouth 2 (two) times daily. Please mail    Dispense:  180 capsule    Refill:  1    Please mail    Follow-up: Return for previously scheduled appointment.       Hoy Register, MD, FAAFP. [facility] [place], [place] [phone]   05/27/2023, 12:18 PM

## 2023-05-27 NOTE — Patient Instructions (Signed)

## 2023-05-28 ENCOUNTER — Ambulatory Visit: Payer: Medicare HMO | Admitting: Physical Therapy

## 2023-05-28 ENCOUNTER — Ambulatory Visit: Payer: Medicare HMO

## 2023-05-28 VITALS — BP 110/75 | HR 101

## 2023-05-28 DIAGNOSIS — R2689 Other abnormalities of gait and mobility: Secondary | ICD-10-CM | POA: Diagnosis not present

## 2023-05-28 DIAGNOSIS — R2681 Unsteadiness on feet: Secondary | ICD-10-CM

## 2023-05-28 DIAGNOSIS — Z7409 Other reduced mobility: Secondary | ICD-10-CM | POA: Diagnosis not present

## 2023-05-28 DIAGNOSIS — R293 Abnormal posture: Secondary | ICD-10-CM

## 2023-05-28 DIAGNOSIS — R262 Difficulty in walking, not elsewhere classified: Secondary | ICD-10-CM | POA: Diagnosis not present

## 2023-05-28 DIAGNOSIS — M6281 Muscle weakness (generalized): Secondary | ICD-10-CM

## 2023-05-28 DIAGNOSIS — Z789 Other specified health status: Secondary | ICD-10-CM | POA: Diagnosis not present

## 2023-05-28 NOTE — Therapy (Signed)
OUTPATIENT PHYSICAL THERAPY NEURO TREATMENT- DISCHARGE   Patient Name: Reginald Rodriguez MRN: [id number] DOB:May 19, 1951, 72 y.o., male Today's Date: 05/28/2023  PHYSICAL THERAPY DISCHARGE SUMMARY  Visits from Start of Care: 6  Current functional level related to goals / functional outcomes: Mod I   Remaining deficits: Decreased LE strength, impaired endurance, impaired balance   Education / Equipment: Continue HEP   Patient agrees to discharge. Patient goals were partially met. Patient is being discharged due to the patient's request.   PCP: Bertram Denver, NP REFERRING PROVIDER: Shan Levans, MD  END OF SESSION:  PT End of Session - 05/28/23 0842     Visit Number 6    Number of Visits 17    Date for PT Re-Evaluation 06/25/23    Authorization Type [employer] medicare    Progress Note Due on Visit 10    PT Start Time 0843    PT Stop Time 0921    PT Time Calculation (min) 38 min    Equipment Utilized During Treatment Gait belt    Activity Tolerance Treatment limited secondary to medical complications (Comment)   Drop in blood sugar   Behavior During Therapy WFL for tasks assessed/performed                Past Medical History:  Diagnosis Date   Diabetes mellitus without complication (HCC)    Hypertension    Stroke (HCC)    No past surgical history on file. Patient Active Problem List   Diagnosis Date Noted   Impaired mobility and activities of daily living 03/02/2022   Stress incontinence 12/31/2021   Type 2 diabetes mellitus with diabetic polyneuropathy, with long-term current use of insulin (HCC) 03/13/2021   Type 2 diabetes mellitus with stage 3b chronic kidney disease, with long-term current use of insulin (HCC) 03/13/2021   Obesity, Class III, BMI 40-49.9 (morbid obesity) (HCC) 12/18/2020   Hyperlipidemia associated with type 2 diabetes mellitus (HCC) 12/17/2020   Tobacco use 06/27/2020   Fatty liver 05/13/2020   Arthritis:Knees, right shoulder 05/13/2020    COPD with emphysema (HCC) 05/13/2020   Erectile dysfunction 05/13/2020   OSA (obstructive sleep apnea) 05/13/2020   Onychomycosis of multiple toenails with type 2 diabetes mellitus (HCC) 05/13/2020   AAA (abdominal aortic aneurysm) (HCC) 05/13/2020   CKD (chronic kidney disease) stage 3, GFR 30-59 ml/min (HCC) 04/16/2020   Poor dentition 04/15/2020   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 04/15/2020   Uncontrolled type 2 diabetes mellitus with hyperglycemia (HCC) 04/15/2020   Essential hypertension 04/15/2020    ONSET DATE:   04/07/2023  referral  REFERRING DIAG: Z74.09,Z78.9 (ICD-10-CM) - Impaired mobility and activities of daily living  THERAPY DIAG:  Muscle weakness (generalized)  Unsteadiness on feet  Other abnormalities of gait and mobility  Difficulty in walking, not elsewhere classified  Impaired mobility and activities of daily living  Abnormal posture  Rationale for Evaluation and Treatment: Rehabilitation  SUBJECTIVE:  SUBJECTIVE STATEMENT: Belinda Block Patient reports feeling sleepy today. Blood sugar is 102 mg/dL at the beginning of session. Had breakfast before coming in. Denies acute changes or falls.   "This is going to be my last session," pt reports he is having difficulty with his transportation as well as moving, so will need an extended break from therapy.  Pt accompanied by: self  PERTINENT HISTORY: HTN, DM, CVA  PAIN:  Are you having pain? No  PATIENT GOALS: To get stronger    TODAY'S TREATMENT:    Ther Act  Blood Sugar 8:50 102 Blood Sugar 9:05 95 Blood Sugar 9:14 93 Vitals:   05/28/23 0854  BP: 110/75  Pulse: (!) 101  Assessed vitals, WNL for therapy  For STG assessment:  [facility] PT Assessment - 05/28/23 0857        Standardized Balance Assessment   Five times sit to stand comments  19.69 sec   BUE, supervision, from standard chair   10 Meter Walk 10 m over 18.22 sec= 0.55 m/sec      Timed Up and Go Test   Normal TUG (seconds) 22.13   upwalker            19.69 sec 5x sts w/ BUE, Supervision 33.72 sec 5x sts w/o BUE, CGA  TUG no AD, CGA 25.57 seconds TUG upwalker, CGA  22.13 seconds  PATIENT EDUCATION:  Education details: continue HEP, monitoring glucose at home, glucose changes with exercise, call dr for new referral when ready to return Person educated: Patient Education method: Explanation Education comprehension: verbalized understanding and needs further education  HOME EXERCISE PROGRAM:  Access Code: [email or web address] URL: [email or web address] Date: 05/10/2023 Prepared by: Alethia Berthold Plaster  Program Notes Walking Program: -Walk one lap in your hallway and then rest for 1-2 minutes -Repeat 3 times -Use your walker   Exercises - Sit to Stand Without Arm Support  - 1 x daily - 7 x weekly - 1-2 sets - 10 reps  GOALS: Goals reviewed with patient? Yes  SHORT TERM GOALS: Target date: 05/28/23   Pt will be independent with initial HEP for improved balance and functional strength  Baseline: "they're going good, I'll keep working on them until I come back over here" Goal status: MET  2.  Pt will improve 5x STS to </= 23 sec to demo improved functional LE strength and balance   Baseline: 28.25, 19.69 sec BUE support supervision Goal status: MET  3.  Pt will improve gait speed to >/= .46m/s to demonstrate improved community ambulation  Baseline: .47m/s, 0.55 m/s (05/28/23) Goal status: NOT MET  4.  Pt will improve TUG to </= 13 secs to demonstrated reduced fall risk  Baseline: 15.6s with rollator, 22.13s w/ upwalker Goal status: NOT MET   LONG TERM GOALS: Target date: 06/25/23  Pt will be independent with final HEP for improved balance and functional strength   Baseline: Goal status: INITIAL  Pt will improve 5x STS to </= 17 sec to demo improved functional LE strength and balance   Baseline: 28.25 Goal status: INITIAL  Pt will improve gait speed to >/= .4m/s to demonstrate improved community ambulation  Baseline: .63m/s Goal status: INITIAL  Pt will improve TUG to </= 11 secs to demonstrated reduced fall risk Baseline: 15.6s with rollator Goal status: INITIAL  ASSESSMENT:  CLINICAL IMPRESSION:  Emphasis of skilled PT session on assessing STGs for discharge. This date, pt has met 2/4 STGs and not met 2/4 STGs. Pt reports good adherence to HEP  and demonstrates improved 5x STS time to 19.69 sec. Pt did not meet goals for gait speed or TUG, w/ gait speed only improving to 0.55 m/s and TUG regressing to 22.13 seconds. Pt to discharge due to difficulties with transportation, educated to ask PCP for referral to return when able.    OBJECTIVE IMPAIRMENTS: Abnormal gait, cardiopulmonary status limiting activity, decreased balance, decreased coordination, decreased endurance, decreased knowledge of condition, decreased knowledge of use of DME, decreased strength, postural dysfunction, and pain.   ACTIVITY LIMITATIONS: carrying, lifting, bending, stairs, transfers, bed mobility, bathing, hygiene/grooming, locomotion level, and caring for others  PARTICIPATION LIMITATIONS: meal prep, cleaning, interpersonal relationship, driving, shopping, and community activity  PERSONAL FACTORS: Age, Fitness, Past/current experiences, Social background, Time since onset of injury/illness/exacerbation, Transportation, and 3+ comorbidities: see above  are also affecting patient's functional outcome.   REHAB POTENTIAL: Fair time since onset  CLINICAL DECISION MAKING: Stable/uncomplicated  EVALUATION COMPLEXITY: Low    Beverely Low, SPT   05/28/2023, 10:14 AM

## 2023-05-31 ENCOUNTER — Ambulatory Visit: Payer: Medicare HMO

## 2023-06-01 ENCOUNTER — Inpatient Hospital Stay: Admission: RE | Admit: 2023-06-01 | Payer: Medicare HMO | Source: Ambulatory Visit

## 2023-06-04 ENCOUNTER — Ambulatory Visit: Payer: Medicare HMO

## 2023-06-06 ENCOUNTER — Other Ambulatory Visit: Payer: Self-pay | Admitting: Critical Care Medicine

## 2023-06-07 ENCOUNTER — Ambulatory Visit: Payer: Medicare HMO

## 2023-06-07 NOTE — Telephone Encounter (Signed)
Requested Prescriptions  Pending Prescriptions Disp Refills   chlorthalidone (HYGROTON) 25 MG tablet [Pharmacy Med Name: Chlorthalidone Oral Tablet 25 MG] 90 tablet 1    Sig: TAKE 1 TABLET EVERY DAY     Cardiovascular: Diuretics - Thiazide Failed - 06/06/2023  6:36 AM      Failed - Cr in normal range and within 180 days    Creatinine, Ser  Date Value Ref Range Status  03/17/2023 1.71 (H) 0.61 - 1.24 mg/dL Final         Failed - K in normal range and within 180 days    Potassium  Date Value Ref Range Status  03/17/2023 3.3 (L) 3.5 - 5.1 mmol/L Final         Passed - Na in normal range and within 180 days    Sodium  Date Value Ref Range Status  03/17/2023 138 135 - 145 mmol/L Final  10/22/2022 141 134 - 144 mmol/L Final         Passed - Last BP in normal range    BP Readings from Last 1 Encounters:  05/28/23 110/75         Passed - Valid encounter within last 6 months    Recent Outpatient Visits           1 week ago Type 2 diabetes mellitus with stage 3b chronic kidney disease, with long-term current use of insulin (HCC)   [facility] [place] [facility] [place]. [facility] Hoy Register, MD   1 month ago Acute bilateral low back pain without sciatica   [place] [facility] - [facility] Dept Of [place]. [facility] [place], [place] W, NP   3 months ago Uncontrolled type 2 diabetes mellitus with hyperglycemia [facility])   [place] [facility] Dept Of [facility] Lois Huxley, [place] L, RPH-CPP   4 months ago Uncontrolled type 2 diabetes mellitus with hyperglycemia [facility])   [place] [facility] - A Dept Of [place]. [facility] Storm Frisk, MD   7 months ago Uncontrolled type 2 diabetes mellitus with hyperglycemia [facility])    [facility] - A Dept Of [place]. [facility] Storm Frisk, MD       Future Appointments             In 1 week Claiborne Rigg, NP [facility] - A Dept Of Eligha Bridegroom. [facility]

## 2023-06-08 NOTE — Congregational Nurse Program (Signed)
  Dept: [phone]   Congregational Nurse Program Note  Date of Encounter: 06/08/2023  Clinic visit to check weight and blood pressure, BP 113/71, pulse 90 and regular.  Weight 275 Lbs, gained 5 lbs in past two weeks. Is no longer having in-home aide prepare meals for him d/t scheduling issues.  Discussed ways to increase vegetable intake and decrease soft drink intake.  Encouraged to drink more water.  Is planning to move to new apartment complex and is concerned that move not completed. Past Medical History: Past Medical History:  Diagnosis Date   Diabetes mellitus without complication (HCC)    Hypertension    Stroke [facility])     Encounter Details:  Community Questionnaire - 06/08/23 1455       Questionnaire   Ask client: Do you give verbal consent for me to treat you today? Yes    Student Assistance N/A    Location [place]    Encounter Setting CN site    Population Status Unknown   Has apartment at [facility] Medicare;Medicaid    Insurance/Financial Assistance Referral N/A    Medication N/A    Medical Provider Yes    Screening Referrals Made N/A    Medical Referrals Made N/A    Medical Appointment Completed N/A    CNP Interventions Counsel;Advocate/Support;Educate;Spiritual Care    Screenings CN Performed Blood Pressure;Weight    ED Visit Averted N/A    Life-Saving Intervention Made N/A      Questionnaire   Do you give verbal consent to treat you today? Yes    Location Patient Served  Not Applicable    Visit Setting Church or Organization    Patient Status Unknown    Insurance Medicare    Insurance Referral N/A    Medication N/A    Screening Referrals N/A    Housing/Utilities N/A    Intervention Blood pressure;Blood glucose;Counsel;Educate

## 2023-06-10 ENCOUNTER — Telehealth: Payer: Self-pay

## 2023-06-10 ENCOUNTER — Other Ambulatory Visit: Payer: Self-pay

## 2023-06-10 NOTE — Telephone Encounter (Signed)
Copied from CRM [id number]. Topic: General - Other >> Jun 10, 2023 11:54 AM [place] B wrote: Reason for CRM: pt called in for refill of inslulin, but doesn't have the name of it and it show 3 different kinds. Please cb pt to confirm which one it is

## 2023-06-11 ENCOUNTER — Ambulatory Visit: Payer: Medicare HMO | Admitting: Physical Therapy

## 2023-06-11 NOTE — Telephone Encounter (Signed)
Patient identified by name and date of birth.  Patient stated that he will bring his medication to his appointment on the 26 th of Nov to clarify which one is needed.

## 2023-06-14 ENCOUNTER — Ambulatory Visit: Payer: Medicare HMO

## 2023-06-15 ENCOUNTER — Ambulatory Visit: Payer: Medicare HMO | Admitting: Critical Care Medicine

## 2023-06-15 ENCOUNTER — Ambulatory Visit: Payer: Medicare HMO | Attending: Critical Care Medicine | Admitting: Nurse Practitioner

## 2023-06-15 DIAGNOSIS — E785 Hyperlipidemia, unspecified: Secondary | ICD-10-CM

## 2023-06-15 NOTE — Progress Notes (Signed)
Mr. Scacco is requesting refills of fenofibrate but has refills on file already.

## 2023-06-16 ENCOUNTER — Ambulatory Visit: Payer: Medicare HMO | Admitting: Physical Therapy

## 2023-06-22 NOTE — Congregational Nurse Program (Signed)
  Dept: [phone]   Congregational Nurse Program Note  Date of Encounter: 06/22/2023  Clinic visit to check blood pressure and discuss dental referral.  BP 105/85, pulse 98 and regular, O2 Sat 98%.  Resident is moving to a new apartment complex and wants to wait to make dental appointment for new dentures. Dentures no longer fit.  Sensors for [employer] did not arrive yesterday as scheduled to be shipped 12/6.  Checking blood glucose by finger stick, 100 -108 in AM this week. Advised to check glucose in PM before dinner until supplies arrive to ensure of proper inulin coverage. Past Medical History: Past Medical History:  Diagnosis Date   Diabetes mellitus without complication (HCC)    Hypertension    Stroke [facility])     Encounter Details:  Community Questionnaire - 06/22/23 1340       Questionnaire   Ask client: Do you give verbal consent for me to treat you today? Yes    Student Assistance N/A    Location [place]    Encounter Setting CN site    Population Status Unknown   Has apartment at [facility] Medicare;Medicaid    Insurance/Financial Assistance Referral N/A    Medication N/A    Medical Provider Yes    Screening Referrals Made N/A    Medical Referrals Made Dental    Medical Appointment Completed N/A    CNP Interventions Counsel;Advocate/Support;Educate;Spiritual Care    Screenings CN Performed Blood Pressure;Weight    ED Visit Averted N/A    Life-Saving Intervention Made N/A      Questionnaire   Do you give verbal consent to treat you today? Yes    Location Patient Served  Not Applicable    Visit Setting Church or Organization    Patient Status Unknown    Insurance Medicare    Insurance Referral N/A    Medication N/A    Screening Referrals N/A    Housing/Utilities N/A    Intervention Blood pressure;Blood glucose;Counsel;Educate

## 2023-07-06 NOTE — Congregational Nurse Program (Signed)
  Dept: [phone]   Congregational Nurse Program Note  Date of Encounter: 07/06/2023  Telephone call to resident who moved to permanent housing at [facility].  States that he is managing his diabetes as usual and has not had any change in blood glucose levels since the move, taking AM Novolog and Lantus PM dosage.  Given information on other CCNP clinics. Past Medical History: Past Medical History:  Diagnosis Date   Diabetes mellitus without complication (HCC)    Hypertension    Stroke [facility])     Encounter Details:

## 2023-07-18 ENCOUNTER — Emergency Department (HOSPITAL_COMMUNITY): Payer: Medicare HMO

## 2023-07-18 ENCOUNTER — Encounter (HOSPITAL_COMMUNITY)
Admission: EM | Disposition: A | Discharge status: Home Health | Payer: Self-pay | Source: Home / Self Care | Attending: Vascular Surgery

## 2023-07-18 ENCOUNTER — Other Ambulatory Visit: Payer: Self-pay

## 2023-07-18 ENCOUNTER — Inpatient Hospital Stay (HOSPITAL_COMMUNITY)
Admission: EM | Admit: 2023-07-18 | Discharge: 2023-07-19 | DRG: 269 | Disposition: A | Discharge status: Home Health | Payer: Medicare HMO | Attending: Vascular Surgery | Admitting: Vascular Surgery

## 2023-07-18 ENCOUNTER — Encounter (HOSPITAL_COMMUNITY): Payer: Self-pay

## 2023-07-18 DIAGNOSIS — F17210 Nicotine dependence, cigarettes, uncomplicated: Secondary | ICD-10-CM | POA: Diagnosis present

## 2023-07-18 DIAGNOSIS — Z6841 Body Mass Index (BMI) 40.0 and over, adult: Secondary | ICD-10-CM

## 2023-07-18 DIAGNOSIS — R079 Chest pain, unspecified: Secondary | ICD-10-CM | POA: Diagnosis not present

## 2023-07-18 DIAGNOSIS — R109 Unspecified abdominal pain: Secondary | ICD-10-CM | POA: Diagnosis not present

## 2023-07-18 DIAGNOSIS — I10 Essential (primary) hypertension: Secondary | ICD-10-CM | POA: Diagnosis present

## 2023-07-18 DIAGNOSIS — Z794 Long term (current) use of insulin: Secondary | ICD-10-CM

## 2023-07-18 DIAGNOSIS — K5730 Diverticulosis of large intestine without perforation or abscess without bleeding: Secondary | ICD-10-CM | POA: Diagnosis not present

## 2023-07-18 DIAGNOSIS — E669 Obesity, unspecified: Secondary | ICD-10-CM | POA: Diagnosis present

## 2023-07-18 DIAGNOSIS — E119 Type 2 diabetes mellitus without complications: Secondary | ICD-10-CM | POA: Diagnosis present

## 2023-07-18 DIAGNOSIS — Z7984 Long term (current) use of oral hypoglycemic drugs: Secondary | ICD-10-CM | POA: Diagnosis not present

## 2023-07-18 DIAGNOSIS — Z880 Allergy status to penicillin: Secondary | ICD-10-CM | POA: Diagnosis not present

## 2023-07-18 DIAGNOSIS — I714 Abdominal aortic aneurysm, without rupture, unspecified: Principal | ICD-10-CM

## 2023-07-18 DIAGNOSIS — G4730 Sleep apnea, unspecified: Secondary | ICD-10-CM | POA: Diagnosis present

## 2023-07-18 DIAGNOSIS — E1165 Type 2 diabetes mellitus with hyperglycemia: Secondary | ICD-10-CM | POA: Diagnosis present

## 2023-07-18 DIAGNOSIS — I77819 Aortic ectasia, unspecified site: Secondary | ICD-10-CM | POA: Diagnosis not present

## 2023-07-18 DIAGNOSIS — R1084 Generalized abdominal pain: Secondary | ICD-10-CM | POA: Diagnosis not present

## 2023-07-18 DIAGNOSIS — I69351 Hemiplegia and hemiparesis following cerebral infarction affecting right dominant side: Secondary | ICD-10-CM

## 2023-07-18 DIAGNOSIS — K5900 Constipation, unspecified: Secondary | ICD-10-CM | POA: Diagnosis present

## 2023-07-18 DIAGNOSIS — I7143 Infrarenal abdominal aortic aneurysm, without rupture: Secondary | ICD-10-CM | POA: Diagnosis not present

## 2023-07-18 DIAGNOSIS — Z79899 Other long term (current) drug therapy: Secondary | ICD-10-CM

## 2023-07-18 DIAGNOSIS — J449 Chronic obstructive pulmonary disease, unspecified: Secondary | ICD-10-CM | POA: Diagnosis present

## 2023-07-18 DIAGNOSIS — Z91030 Bee allergy status: Secondary | ICD-10-CM

## 2023-07-18 DIAGNOSIS — Z9889 Other specified postprocedural states: Secondary | ICD-10-CM

## 2023-07-18 DIAGNOSIS — Z7982 Long term (current) use of aspirin: Secondary | ICD-10-CM | POA: Diagnosis not present

## 2023-07-18 DIAGNOSIS — I700 Atherosclerosis of aorta: Secondary | ICD-10-CM | POA: Diagnosis not present

## 2023-07-18 DIAGNOSIS — I771 Stricture of artery: Secondary | ICD-10-CM | POA: Diagnosis not present

## 2023-07-18 HISTORY — PX: ULTRASOUND GUIDANCE FOR VASCULAR ACCESS: SHX6516

## 2023-07-18 HISTORY — PX: ABDOMINAL AORTIC ENDOVASCULAR STENT GRAFT: SHX5707

## 2023-07-18 LAB — POCT I-STAT 7, (LYTES, BLD GAS, ICA,H+H)
Acid-Base Excess: 3 mmol/L — ABNORMAL HIGH (ref 0.0–2.0)
Bicarbonate: 29.3 mmol/L — ABNORMAL HIGH (ref 20.0–28.0)
Calcium, Ion: 1.26 mmol/L (ref 1.15–1.40)
HCT: 43 % (ref 39.0–52.0)
Hemoglobin: 14.6 g/dL (ref 13.0–17.0)
O2 Saturation: 100 %
Patient temperature: 37
Potassium: 3.2 mmol/L — ABNORMAL LOW (ref 3.5–5.1)
Sodium: 140 mmol/L (ref 135–145)
TCO2: 31 mmol/L (ref 22–32)
pCO2 arterial: 52.3 mm[Hg] — ABNORMAL HIGH (ref 32–48)
pH, Arterial: 7.357 (ref 7.35–7.45)
pO2, Arterial: 382 mm[Hg] — ABNORMAL HIGH (ref 83–108)

## 2023-07-18 LAB — COMPREHENSIVE METABOLIC PANEL
ALT: 26 U/L (ref 0–44)
AST: 32 U/L (ref 15–41)
Albumin: 4.1 g/dL (ref 3.5–5.0)
Alkaline Phosphatase: 32 U/L — ABNORMAL LOW (ref 38–126)
Anion gap: 11 (ref 5–15)
BUN: 28 mg/dL — ABNORMAL HIGH (ref 8–23)
CO2: 28 mmol/L (ref 22–32)
Calcium: 10.6 mg/dL — ABNORMAL HIGH (ref 8.9–10.3)
Chloride: 99 mmol/L (ref 98–111)
Creatinine, Ser: 1.73 mg/dL — ABNORMAL HIGH (ref 0.61–1.24)
GFR, Estimated: 42 mL/min — ABNORMAL LOW (ref >60)
Glucose, Bld: 111 mg/dL — ABNORMAL HIGH (ref 70–99)
Potassium: 3.8 mmol/L (ref 3.5–5.1)
Sodium: 138 mmol/L (ref 135–145)
Total Bilirubin: 0.7 mg/dL (ref <1.2)
Total Protein: 7.7 g/dL (ref 6.5–8.1)

## 2023-07-18 LAB — APTT: aPTT: 35 s (ref 24–36)

## 2023-07-18 LAB — GLUCOSE, CAPILLARY
Glucose-Capillary: 119 mg/dL — ABNORMAL HIGH (ref 70–99)
Glucose-Capillary: 122 mg/dL — ABNORMAL HIGH (ref 70–99)

## 2023-07-18 LAB — CBC
HCT: 49.6 % (ref 39.0–52.0)
HCT: 41 % (ref 39.0–52.0)
Hemoglobin: 16.2 g/dL (ref 13.0–17.0)
Hemoglobin: 13.6 g/dL (ref 13.0–17.0)
MCH: 30 pg (ref 26.0–34.0)
MCH: 30.3 pg (ref 26.0–34.0)
MCHC: 32.7 g/dL (ref 30.0–36.0)
MCHC: 33.2 g/dL (ref 30.0–36.0)
MCV: 91.9 fL (ref 80.0–100.0)
MCV: 91.3 fL (ref 80.0–100.0)
Platelets: 328 10*3/uL (ref 150–400)
Platelets: 250 10*3/uL (ref 150–400)
RBC: 5.4 MIL/uL (ref 4.22–5.81)
RBC: 4.49 MIL/uL (ref 4.22–5.81)
RDW: 15.4 % (ref 11.5–15.5)
RDW: 15.4 % (ref 11.5–15.5)
WBC: 7.4 10*3/uL (ref 4.0–10.5)
WBC: 9.8 10*3/uL (ref 4.0–10.5)
nRBC: 0 % (ref 0.0–0.2)
nRBC: 0 % (ref 0.0–0.2)

## 2023-07-18 LAB — BASIC METABOLIC PANEL
Anion gap: 7 (ref 5–15)
BUN: 25 mg/dL — ABNORMAL HIGH (ref 8–23)
CO2: 26 mmol/L (ref 22–32)
Calcium: 9.2 mg/dL (ref 8.9–10.3)
Chloride: 104 mmol/L (ref 98–111)
Creatinine, Ser: 1.85 mg/dL — ABNORMAL HIGH (ref 0.61–1.24)
GFR, Estimated: 39 mL/min — ABNORMAL LOW (ref >60)
Glucose, Bld: 122 mg/dL — ABNORMAL HIGH (ref 70–99)
Potassium: 3.4 mmol/L — ABNORMAL LOW (ref 3.5–5.1)
Sodium: 137 mmol/L (ref 135–145)

## 2023-07-18 LAB — URINALYSIS, ROUTINE W REFLEX MICROSCOPIC
Bilirubin Urine: NEGATIVE
Glucose, UA: >=500 mg/dL — AB
Hgb urine dipstick: NEGATIVE
Ketones, ur: NEGATIVE mg/dL
Leukocytes,Ua: NEGATIVE
Nitrite: NEGATIVE
Protein, ur: NEGATIVE mg/dL
Specific Gravity, Urine: 1.021 (ref 1.005–1.030)
pH: 5 (ref 5.0–8.0)

## 2023-07-18 LAB — POCT ACTIVATED CLOTTING TIME
Activated Clotting Time: 250 s
Activated Clotting Time: 285 s

## 2023-07-18 LAB — TROPONIN I (HIGH SENSITIVITY)
Troponin I (High Sensitivity): 17 ng/L (ref <18)
Troponin I (High Sensitivity): 14 ng/L (ref <18)

## 2023-07-18 LAB — PROTIME-INR
INR: 1.1 (ref 0.8–1.2)
Prothrombin Time: 14.3 s (ref 11.4–15.2)

## 2023-07-18 LAB — LIPASE, BLOOD: Lipase: 27 U/L (ref 11–51)

## 2023-07-18 LAB — PREPARE RBC (CROSSMATCH)

## 2023-07-18 LAB — MAGNESIUM: Magnesium: 2 mg/dL (ref 1.7–2.4)

## 2023-07-18 LAB — ABO/RH: ABO/RH(D): O POS

## 2023-07-18 SURGERY — INSERTION, ENDOVASCULAR STENT GRAFT, AORTA, ABDOMINAL
Anesthesia: General | Site: Groin

## 2023-07-18 MED ORDER — DEXAMETHASONE SODIUM PHOSPHATE 10 MG/ML IJ SOLN
INTRAMUSCULAR | Status: AC
  Filled 2023-07-18: qty 1

## 2023-07-18 MED ORDER — INSULIN ASPART 100 UNIT/ML IJ SOLN: 0.0000 [IU] | Freq: Every day | INTRAMUSCULAR | Status: DC

## 2023-07-18 MED ORDER — SODIUM CHLORIDE 0.9% FLUSH
10.0000 mL | Freq: Two times a day (BID) | INTRAVENOUS | Status: DC
  Administered 2023-07-18: 10 mL

## 2023-07-18 MED ORDER — GABAPENTIN 100 MG PO CAPS
100.0000 mg | ORAL_CAPSULE | Freq: Two times a day (BID) | ORAL | Status: DC
  Administered 2023-07-18 – 2023-07-19 (×2): 100 mg via ORAL
  Filled 2023-07-18 (×2): qty 1

## 2023-07-18 MED ORDER — PROPOFOL 10 MG/ML IV BOLUS
INTRAVENOUS | Status: AC
  Filled 2023-07-18: qty 20

## 2023-07-18 MED ORDER — ACETAMINOPHEN 10 MG/ML IV SOLN
INTRAVENOUS | Status: AC
  Filled 2023-07-18: qty 100

## 2023-07-18 MED ORDER — ORAL CARE MOUTH RINSE
15.0000 mL | OROMUCOSAL | Status: DC | PRN
Start: 2023-07-18 — End: 2023-07-20

## 2023-07-18 MED ORDER — SODIUM CHLORIDE 0.9 % IV SOLN: 500.0000 mL | Freq: Once | INTRAVENOUS | Status: DC | PRN

## 2023-07-18 MED ORDER — CEFAZOLIN SODIUM-DEXTROSE 2-4 GM/100ML-% IV SOLN
2.0000 g | Freq: Three times a day (TID) | INTRAVENOUS | Status: AC
  Administered 2023-07-19 (×2): 2 g via INTRAVENOUS
  Filled 2023-07-18 (×2): qty 100

## 2023-07-18 MED ORDER — ROCURONIUM BROMIDE 10 MG/ML (PF) SYRINGE
PREFILLED_SYRINGE | INTRAVENOUS | Status: AC
  Filled 2023-07-18: qty 10

## 2023-07-18 MED ORDER — CEFAZOLIN SODIUM-DEXTROSE 2-3 GM-%(50ML) IV SOLR
INTRAVENOUS | Status: DC | PRN
  Administered 2023-07-18: 2 g via INTRAVENOUS

## 2023-07-18 MED ORDER — OXYCODONE HCL 5 MG PO TABS
5.0000 mg | ORAL_TABLET | ORAL | Status: DC | PRN
  Administered 2023-07-19: 10 mg via ORAL
  Administered 2023-07-19: 5 mg via ORAL
  Filled 2023-07-18: qty 2
  Filled 2023-07-18: qty 1

## 2023-07-18 MED ORDER — LACTATED RINGERS IV SOLN: INTRAVENOUS | Status: DC | PRN

## 2023-07-18 MED ORDER — FENTANYL CITRATE PF 50 MCG/ML IJ SOSY
25.0000 ug | PREFILLED_SYRINGE | Freq: Once | INTRAMUSCULAR | Status: AC
  Administered 2023-07-18: 25 ug via INTRAVENOUS
  Filled 2023-07-18: qty 1

## 2023-07-18 MED ORDER — FENOFIBRATE 160 MG PO TABS
160.0000 mg | ORAL_TABLET | Freq: Every day | ORAL | Status: DC
  Administered 2023-07-19: 160 mg via ORAL
  Filled 2023-07-18: qty 1

## 2023-07-18 MED ORDER — PROPOFOL 10 MG/ML IV BOLUS
INTRAVENOUS | Status: DC | PRN
  Administered 2023-07-18: 150 mg via INTRAVENOUS

## 2023-07-18 MED ORDER — SODIUM CHLORIDE 0.9% FLUSH
3.0000 mL | Freq: Two times a day (BID) | INTRAVENOUS | Status: DC
  Administered 2023-07-18 – 2023-07-19 (×2): 3 mL via INTRAVENOUS

## 2023-07-18 MED ORDER — MAGNESIUM SULFATE 2 GM/50ML IV SOLN: 2.0000 g | Freq: Every day | INTRAVENOUS | Status: DC | PRN

## 2023-07-18 MED ORDER — CHLORTHALIDONE 25 MG PO TABS
25.0000 mg | ORAL_TABLET | Freq: Every day | ORAL | Status: DC
  Administered 2023-07-19: 25 mg via ORAL
  Filled 2023-07-18: qty 1

## 2023-07-18 MED ORDER — POTASSIUM CHLORIDE CRYS ER 20 MEQ PO TBCR
20.0000 meq | EXTENDED_RELEASE_TABLET | Freq: Every day | ORAL | Status: AC | PRN
  Administered 2023-07-19: 40 meq via ORAL
  Filled 2023-07-18: qty 2

## 2023-07-18 MED ORDER — HYDROMORPHONE HCL 1 MG/ML IJ SOLN
0.5000 mg | INTRAMUSCULAR | Status: DC | PRN
Start: 2023-07-18 — End: 2023-07-20
  Administered 2023-07-18: 1 mg via INTRAVENOUS
  Filled 2023-07-18: qty 1

## 2023-07-18 MED ORDER — MIDAZOLAM HCL 2 MG/2ML IJ SOLN
INTRAMUSCULAR | Status: AC
  Filled 2023-07-18: qty 2

## 2023-07-18 MED ORDER — PIOGLITAZONE HCL 30 MG PO TABS
30.0000 mg | ORAL_TABLET | Freq: Every day | ORAL | Status: DC
Start: 2023-07-19 — End: 2023-07-20
  Administered 2023-07-19: 30 mg via ORAL
  Filled 2023-07-18: qty 1

## 2023-07-18 MED ORDER — SODIUM CHLORIDE 0.9% IV SOLUTION: Freq: Once | INTRAVENOUS | Status: DC

## 2023-07-18 MED ORDER — SODIUM CHLORIDE 0.9 % IV BOLUS
1000.0000 mL | Freq: Once | INTRAVENOUS | Status: AC
  Administered 2023-07-18: 1000 mL via INTRAVENOUS

## 2023-07-18 MED ORDER — ONDANSETRON HCL 4 MG/2ML IJ SOLN: 4.0000 mg | Freq: Four times a day (QID) | INTRAMUSCULAR | Status: DC | PRN

## 2023-07-18 MED ORDER — LIDOCAINE 2% (20 MG/ML) 5 ML SYRINGE
INTRAMUSCULAR | Status: AC
Start: 2023-07-18 — End: ?
  Filled 2023-07-18: qty 5

## 2023-07-18 MED ORDER — ONDANSETRON HCL 4 MG/2ML IJ SOLN
INTRAMUSCULAR | Status: AC
  Filled 2023-07-18: qty 2

## 2023-07-18 MED ORDER — HEPARIN SODIUM (PORCINE) 1000 UNIT/ML IJ SOLN
INTRAMUSCULAR | Status: AC
Start: 2023-07-18 — End: ?
  Filled 2023-07-18: qty 10

## 2023-07-18 MED ORDER — FENTANYL CITRATE (PF) 250 MCG/5ML IJ SOLN
INTRAMUSCULAR | Status: AC
  Filled 2023-07-18: qty 5

## 2023-07-18 MED ORDER — ESMOLOL HCL 100 MG/10ML IV SOLN
INTRAVENOUS | Status: DC | PRN
  Administered 2023-07-18 (×2): 50 mg via INTRAVENOUS

## 2023-07-18 MED ORDER — PHENYLEPHRINE 80 MCG/ML (10ML) SYRINGE FOR IV PUSH (FOR BLOOD PRESSURE SUPPORT): PREFILLED_SYRINGE | INTRAVENOUS | Status: DC | PRN

## 2023-07-18 MED ORDER — LABETALOL HCL 5 MG/ML IV SOLN
INTRAVENOUS | Status: DC | PRN
  Administered 2023-07-18: 10 mg via INTRAVENOUS

## 2023-07-18 MED ORDER — PHENYLEPHRINE HCL-NACL 20-0.9 MG/250ML-% IV SOLN
INTRAVENOUS | Status: DC | PRN
  Administered 2023-07-18: 40 ug/min via INTRAVENOUS

## 2023-07-18 MED ORDER — FENTANYL CITRATE (PF) 100 MCG/2ML IJ SOLN: 25.0000 ug | INTRAMUSCULAR | Status: DC | PRN

## 2023-07-18 MED ORDER — SODIUM CHLORIDE 0.9% FLUSH: 10.0000 mL | INTRAVENOUS | Status: DC | PRN

## 2023-07-18 MED ORDER — SENNOSIDES-DOCUSATE SODIUM 8.6-50 MG PO TABS: 1.0000 | ORAL_TABLET | Freq: Every evening | ORAL | Status: DC | PRN

## 2023-07-18 MED ORDER — DIPHENHYDRAMINE HCL 50 MG/ML IJ SOLN
INTRAMUSCULAR | Status: AC
  Filled 2023-07-18: qty 1

## 2023-07-18 MED ORDER — ASPIRIN 81 MG PO TBEC
81.0000 mg | DELAYED_RELEASE_TABLET | Freq: Every day | ORAL | Status: DC
Start: 2023-07-19 — End: 2023-07-20
  Administered 2023-07-19: 81 mg via ORAL
  Filled 2023-07-18: qty 1

## 2023-07-18 MED ORDER — ONDANSETRON HCL 4 MG/2ML IJ SOLN
INTRAMUSCULAR | Status: DC | PRN
  Administered 2023-07-18: 4 mg via INTRAVENOUS

## 2023-07-18 MED ORDER — DAPAGLIFLOZIN PROPANEDIOL 10 MG PO TABS
10.0000 mg | ORAL_TABLET | Freq: Every day | ORAL | Status: DC
  Administered 2023-07-19: 10 mg via ORAL
  Filled 2023-07-18 (×2): qty 1

## 2023-07-18 MED ORDER — FENTANYL CITRATE (PF) 250 MCG/5ML IJ SOLN
INTRAMUSCULAR | Status: DC | PRN
  Administered 2023-07-18: 150 ug via INTRAVENOUS

## 2023-07-18 MED ORDER — SUGAMMADEX SODIUM 200 MG/2ML IV SOLN
INTRAVENOUS | Status: DC | PRN
  Administered 2023-07-18: 260 mg via INTRAVENOUS

## 2023-07-18 MED ORDER — IPRATROPIUM-ALBUTEROL 0.5-2.5 (3) MG/3ML IN SOLN
3.0000 mL | Freq: Once | RESPIRATORY_TRACT | Status: AC
  Administered 2023-07-18: 3 mL via RESPIRATORY_TRACT

## 2023-07-18 MED ORDER — DROPERIDOL 2.5 MG/ML IJ SOLN: 0.6250 mg | Freq: Once | INTRAMUSCULAR | Status: DC | PRN

## 2023-07-18 MED ORDER — ACETAMINOPHEN 325 MG PO TABS: 325.0000 mg | ORAL_TABLET | ORAL | Status: DC | PRN

## 2023-07-18 MED ORDER — PHENOL 1.4 % MT LIQD
1.0000 | OROMUCOSAL | Status: DC | PRN
  Filled 2023-07-18: qty 177

## 2023-07-18 MED ORDER — CEFAZOLIN SODIUM 1 G IJ SOLR
INTRAMUSCULAR | Status: AC
  Filled 2023-07-18: qty 20

## 2023-07-18 MED ORDER — SUCCINYLCHOLINE CHLORIDE 200 MG/10ML IV SOSY
PREFILLED_SYRINGE | INTRAVENOUS | Status: AC
  Filled 2023-07-18: qty 10

## 2023-07-18 MED ORDER — HYDRALAZINE HCL 20 MG/ML IJ SOLN: 5.0000 mg | INTRAMUSCULAR | Status: DC | PRN

## 2023-07-18 MED ORDER — LISINOPRIL 2.5 MG PO TABS
2.5000 mg | ORAL_TABLET | Freq: Every day | ORAL | Status: DC
  Administered 2023-07-19: 2.5 mg via ORAL
  Filled 2023-07-18: qty 1

## 2023-07-18 MED ORDER — OXYCODONE HCL 5 MG/5ML PO SOLN: 5.0000 mg | Freq: Once | ORAL | Status: DC | PRN

## 2023-07-18 MED ORDER — INSULIN ASPART 100 UNIT/ML IJ SOLN
0.0000 [IU] | Freq: Three times a day (TID) | INTRAMUSCULAR | Status: DC
  Administered 2023-07-19 (×2): 7 [IU] via SUBCUTANEOUS

## 2023-07-18 MED ORDER — DOCUSATE SODIUM 100 MG PO CAPS
100.0000 mg | ORAL_CAPSULE | Freq: Every day | ORAL | Status: DC
Start: 2023-07-19 — End: 2023-07-20
  Administered 2023-07-19: 100 mg via ORAL
  Filled 2023-07-18: qty 1

## 2023-07-18 MED ORDER — EPHEDRINE 5 MG/ML INJ
INTRAVENOUS | Status: AC
Start: 2023-07-18 — End: ?
  Filled 2023-07-18: qty 5

## 2023-07-18 MED ORDER — IPRATROPIUM-ALBUTEROL 0.5-2.5 (3) MG/3ML IN SOLN
RESPIRATORY_TRACT | Status: AC
  Filled 2023-07-18: qty 3

## 2023-07-18 MED ORDER — PROTAMINE SULFATE 10 MG/ML IV SOLN
INTRAVENOUS | Status: DC | PRN
  Administered 2023-07-18: 50 mg via INTRAVENOUS

## 2023-07-18 MED ORDER — HEPARIN SODIUM (PORCINE) 1000 UNIT/ML IJ SOLN
INTRAMUSCULAR | Status: DC | PRN
  Administered 2023-07-18: 10000 [IU] via INTRAVENOUS

## 2023-07-18 MED ORDER — ORAL CARE MOUTH RINSE: 15.0000 mL | OROMUCOSAL | Status: DC | PRN

## 2023-07-18 MED ORDER — CHLORHEXIDINE GLUCONATE CLOTH 2 % EX PADS
6.0000 | MEDICATED_PAD | Freq: Every day | CUTANEOUS | Status: DC
  Administered 2023-07-19: 6 via TOPICAL

## 2023-07-18 MED ORDER — ACETAMINOPHEN 10 MG/ML IV SOLN: 1000.0000 mg | Freq: Once | INTRAVENOUS | Status: DC | PRN

## 2023-07-18 MED ORDER — AMLODIPINE BESYLATE 5 MG PO TABS
5.0000 mg | ORAL_TABLET | Freq: Every day | ORAL | Status: DC
Start: 2023-07-19 — End: 2023-07-20
  Administered 2023-07-19: 5 mg via ORAL
  Filled 2023-07-18: qty 1

## 2023-07-18 MED ORDER — MIDAZOLAM HCL 2 MG/2ML IJ SOLN
INTRAMUSCULAR | Status: DC | PRN
  Administered 2023-07-18: 2 mg via INTRAVENOUS

## 2023-07-18 MED ORDER — LIDOCAINE 2% (20 MG/ML) 5 ML SYRINGE
INTRAMUSCULAR | Status: AC
  Filled 2023-07-18: qty 5

## 2023-07-18 MED ORDER — LIDOCAINE HCL (PF) 2 % IJ SOLN
INTRAMUSCULAR | Status: DC | PRN
  Administered 2023-07-18: 100 mg via INTRADERMAL

## 2023-07-18 MED ORDER — SODIUM CHLORIDE 0.9 % IV SOLN: 250.0000 mL | INTRAVENOUS | Status: DC | PRN

## 2023-07-18 MED ORDER — LABETALOL HCL 5 MG/ML IV SOLN: 10.0000 mg | INTRAVENOUS | Status: DC | PRN

## 2023-07-18 MED ORDER — HEPARIN 6000 UNIT IRRIGATION SOLUTION
Status: DC | PRN
  Administered 2023-07-18: 1

## 2023-07-18 MED ORDER — METOPROLOL TARTRATE 5 MG/5ML IV SOLN: 2.0000 mg | INTRAVENOUS | Status: DC | PRN

## 2023-07-18 MED ORDER — HEPARIN SODIUM (PORCINE) 5000 UNIT/ML IJ SOLN
5000.0000 [IU] | Freq: Three times a day (TID) | INTRAMUSCULAR | Status: DC
  Administered 2023-07-19 (×2): 5000 [IU] via SUBCUTANEOUS
  Filled 2023-07-18 (×2): qty 1

## 2023-07-18 MED ORDER — IOHEXOL 350 MG/ML SOLN
55.0000 mL | Freq: Once | INTRAVENOUS | Status: AC | PRN
  Administered 2023-07-18: 55 mL via INTRAVENOUS

## 2023-07-18 MED ORDER — BISACODYL 5 MG PO TBEC: 5.0000 mg | DELAYED_RELEASE_TABLET | Freq: Every day | ORAL | Status: DC | PRN

## 2023-07-18 MED ORDER — PANTOPRAZOLE SODIUM 40 MG PO TBEC
40.0000 mg | DELAYED_RELEASE_TABLET | Freq: Every day | ORAL | Status: DC
  Administered 2023-07-19: 40 mg via ORAL
  Filled 2023-07-18: qty 1

## 2023-07-18 MED ORDER — VASOPRESSIN 20 UNIT/ML IV SOLN
INTRAVENOUS | Status: AC
Start: 2023-07-18 — End: ?
  Filled 2023-07-18: qty 1

## 2023-07-18 MED ORDER — DEXAMETHASONE SODIUM PHOSPHATE 10 MG/ML IJ SOLN
INTRAMUSCULAR | Status: DC | PRN
  Administered 2023-07-18: 5 mg via INTRAVENOUS

## 2023-07-18 MED ORDER — SODIUM CHLORIDE 0.9% FLUSH: 3.0000 mL | INTRAVENOUS | Status: DC | PRN

## 2023-07-18 MED ORDER — PHENYLEPHRINE 80 MCG/ML (10ML) SYRINGE FOR IV PUSH (FOR BLOOD PRESSURE SUPPORT)
PREFILLED_SYRINGE | INTRAVENOUS | Status: AC
Start: 2023-07-18 — End: ?
  Filled 2023-07-18: qty 10

## 2023-07-18 MED ORDER — ALUM & MAG HYDROXIDE-SIMETH 200-200-20 MG/5ML PO SUSP: 15.0000 mL | ORAL | Status: DC | PRN

## 2023-07-18 MED ORDER — ACETAMINOPHEN 325 MG RE SUPP: 325.0000 mg | RECTAL | Status: DC | PRN

## 2023-07-18 MED ORDER — ROCURONIUM BROMIDE 10 MG/ML (PF) SYRINGE
PREFILLED_SYRINGE | INTRAVENOUS | Status: DC | PRN
  Administered 2023-07-18: 100 mg via INTRAVENOUS

## 2023-07-18 MED ORDER — GUAIFENESIN-DM 100-10 MG/5ML PO SYRP
15.0000 mL | ORAL_SOLUTION | ORAL | Status: DC | PRN
  Administered 2023-07-19: 15 mL via ORAL
  Filled 2023-07-18: qty 15

## 2023-07-18 MED ORDER — DIPHENHYDRAMINE HCL 50 MG/ML IJ SOLN
INTRAMUSCULAR | Status: DC | PRN
  Administered 2023-07-18: 12.5 mg via INTRAVENOUS

## 2023-07-18 MED ORDER — ACETAMINOPHEN 10 MG/ML IV SOLN
INTRAVENOUS | Status: DC | PRN
  Administered 2023-07-18: 1000 mg via INTRAVENOUS

## 2023-07-18 MED ORDER — OXYCODONE HCL 5 MG PO TABS: 5.0000 mg | ORAL_TABLET | Freq: Once | ORAL | Status: DC | PRN

## 2023-07-18 MED ORDER — ALBUMIN HUMAN 5 % IV SOLN: INTRAVENOUS | Status: DC | PRN

## 2023-07-18 MED ORDER — IODIXANOL 320 MG/ML IV SOLN
INTRAVENOUS | Status: DC | PRN
  Administered 2023-07-18: 70.7 mL

## 2023-07-18 MED ORDER — ATORVASTATIN CALCIUM 10 MG PO TABS
10.0000 mg | ORAL_TABLET | Freq: Every day | ORAL | Status: DC
  Administered 2023-07-19: 10 mg via ORAL
  Filled 2023-07-18: qty 1

## 2023-07-18 SURGICAL SUPPLY — 53 items
BAG COUNTER SPONGE SURGICOUNT (BAG) ×2 IMPLANT
BENZOIN TINCTURE PRP APPL 2/3 (GAUZE/BANDAGES/DRESSINGS) ×2 IMPLANT
BLADE CLIPPER SURG (BLADE) ×2 IMPLANT
CANISTER SUCT 3000ML PPV (MISCELLANEOUS) ×2 IMPLANT
CATH ACCU-VU SIZ PIG 5F 70CM (CATHETERS) IMPLANT
CATH ANGIO 5F BER2 65CM (CATHETERS) IMPLANT
CATH BEACON 5.038 65CM KMP-01 (CATHETERS) ×2 IMPLANT
CATH OMNI FLUSH .035X70CM (CATHETERS) ×2 IMPLANT
CATH VANSCH 5FR 6CM (CATHETERS) IMPLANT
CHLORAPREP W/TINT 26 (MISCELLANEOUS) ×2 IMPLANT
DEVICE CLOSURE PERCLS PRGLD 6F (VASCULAR PRODUCTS) IMPLANT
DEVICE TORQUE SEADRAGON GRN (MISCELLANEOUS) IMPLANT
DRSG IV TEGADERM 3.5X4.5 STRL (GAUZE/BANDAGES/DRESSINGS) IMPLANT
DRSG TEGADERM 2-3/8X2-3/4 SM (GAUZE/BANDAGES/DRESSINGS) ×4 IMPLANT
ELECT REM PT RETURN 9FT ADLT (ELECTROSURGICAL) ×4 IMPLANT
ELECTRODE REM PT RTRN 9FT ADLT (ELECTROSURGICAL) ×4 IMPLANT
EXCLDR TRNK 28.5X14.5X12 16F (Endovascular Graft) ×2 IMPLANT
EXCLUDER TNK 28.5X14.5X12 16F (Endovascular Graft) IMPLANT
EXTENDER ENDOPROSTHESIS 12X7 (Endovascular Graft) IMPLANT
GAUZE SPONGE 2X2 8PLY STRL LF (GAUZE/BANDAGES/DRESSINGS) ×4 IMPLANT
GLIDEWIRE ADV .035X260CM (WIRE) IMPLANT
GLOVE BIO SURGEON STRL SZ8 (GLOVE) ×2 IMPLANT
GOWN STRL REUS W/ TWL LRG LVL3 (GOWN DISPOSABLE) ×4 IMPLANT
GOWN STRL REUS W/ TWL XL LVL3 (GOWN DISPOSABLE) ×2 IMPLANT
GRAFT BALLN CATH 65CM (BALLOONS) ×2 IMPLANT
GUIDEWIRE ANGLED .035X150CM (WIRE) IMPLANT
KIT BASIN OR (CUSTOM PROCEDURE TRAY) ×2 IMPLANT
KIT DRAIN CSF ACCUDRAIN (MISCELLANEOUS) IMPLANT
KIT TURNOVER KIT B (KITS) ×2 IMPLANT
NS IRRIG 1000ML POUR BTL (IV SOLUTION) ×2 IMPLANT
PACK ENDOVASCULAR (PACKS) ×2 IMPLANT
PAD ARMBOARD 7.5X6 YLW CONV (MISCELLANEOUS) ×4 IMPLANT
PERCLOSE PROGLIDE 6F (VASCULAR PRODUCTS) IMPLANT
SET MICROPUNCTURE 5F STIFF (MISCELLANEOUS) ×2 IMPLANT
SHEATH BRITE TIP 8FR 23CM (SHEATH) ×2 IMPLANT
SHEATH DRYSEAL FLEX 12FR 33CM (SHEATH) IMPLANT
SHEATH DRYSEAL FLEX 18FR 33CM (SHEATH) IMPLANT
SHEATH PINNACLE 8F 10CM (SHEATH) ×2 IMPLANT
STENT GRAFT CONTRALAT 16X11.5 (Endovascular Graft) IMPLANT
STENT GRAFT CONTRALAT 16X13.5 (Endovascular Graft) IMPLANT
STOPCOCK MORSE 400PSI 3WAY (MISCELLANEOUS) ×2 IMPLANT
STRIP CLOSURE SKIN 1/2X4 (GAUZE/BANDAGES/DRESSINGS) ×4 IMPLANT
SUT MNCRL AB 4-0 PS2 18 (SUTURE) ×4 IMPLANT
SUT PROLENE 5 0 C 1 24 (SUTURE) IMPLANT
SUT VIC AB 2-0 CT1 TAPERPNT 27 (SUTURE) IMPLANT
SUT VIC AB 3-0 SH 27X BRD (SUTURE) IMPLANT
SYR 20CC LL (SYRINGE) ×4 IMPLANT
TOWEL GREEN STERILE (TOWEL DISPOSABLE) ×2 IMPLANT
TRAY FOLEY MTR SLVR 16FR STAT (SET/KITS/TRAYS/PACK) ×2 IMPLANT
TUBING INJECTOR 48 (MISCELLANEOUS) ×2 IMPLANT
WIRE BENTSON .035X145CM (WIRE) ×4 IMPLANT
WIRE LUNDERQUIST .035X180CM (WIRE) ×4 IMPLANT
WIRE TORQFLEX AUST .018X40CM (WIRE) IMPLANT

## 2023-07-18 NOTE — Anesthesia Procedure Notes (Signed)
Arterial Line Insertion Start/End12/29/2024 7:30 PM, 07/18/2023 7:35 PM Performed by: Derby Line Nation, MD, anesthesiologist  Patient location: Pre-op. Preanesthetic checklist: patient identified, IV checked, site marked, risks and benefits discussed, surgical consent, monitors and equipment checked, pre-op evaluation, timeout performed and anesthesia consent Lidocaine 1% used for infiltration Left, radial was placed Catheter size: 20 G Hand hygiene performed  and maximum sterile barriers used   Attempts: 1 Procedure performed using ultrasound guided technique. Ultrasound Notes:image(s) printed for medical record Following insertion, dressing applied. Post procedure assessment: normal and unchanged  Patient tolerated the procedure well with no immediate complications.

## 2023-07-18 NOTE — Anesthesia Preprocedure Evaluation (Signed)
Anesthesia Evaluation  Patient identified by MRN, date of birth, ID band Patient awake    Reviewed: Allergy & Precautions, H&P , NPO status , Patient's Chart, lab work & pertinent test results  Airway Mallampati: II  TM Distance: >3 FB Neck ROM: Full    Dental no notable dental hx.    Pulmonary sleep apnea , COPD, Current Smoker   Pulmonary exam normal breath sounds clear to auscultation       Cardiovascular hypertension, Normal cardiovascular exam Rhythm:Regular Rate:Normal  73 mm infrarenal abdominal aortic aneurysm   Neuro/Psych Residual right hemiparesis  Neuromuscular disease CVA  negative psych ROS   GI/Hepatic negative GI ROS, Neg liver ROS,,,  Endo/Other  diabetes, Poorly Controlled, Type 2    Renal/GU CRFRenal disease  negative genitourinary   Musculoskeletal  (+) Arthritis ,    Abdominal   Peds negative pediatric ROS (+)  Hematology negative hematology ROS (+)   Anesthesia Other Findings   Reproductive/Obstetrics negative OB ROS                              Anesthesia Physical Anesthesia Plan  ASA: 4 and emergent  Anesthesia Plan: General   Post-op Pain Management:    Induction: Intravenous and Rapid sequence  PONV Risk Score and Plan: 2 and Ondansetron, Dexamethasone and Treatment may vary due to age or medical condition  Airway Management Planned: Oral ETT  Additional Equipment: Arterial line  Intra-op Plan:   Post-operative Plan: Possible Post-op intubation/ventilation  Informed Consent: I have reviewed the patients History and Physical, chart, labs and discussed the procedure including the risks, benefits and alternatives for the proposed anesthesia with the patient or authorized representative who has indicated his/her understanding and acceptance.     Dental advisory given  Plan Discussed with: CRNA  Anesthesia Plan Comments:          Anesthesia  Quick Evaluation

## 2023-07-18 NOTE — H&P (Signed)
[facility] [place]  ASSESSMENT / PLAN: 72 y.o. male with symptomatic infrarenal abdominal aortic aneurysm measuring 73mm. No other explanation for pain found on workup. Anatomy appears suitable for EVAR. Will plan to repair this urgently.  CHIEF COMPLAINT: right sided abdominal pain  HISTORY OF PRESENT ILLNESS: Reginald Rodriguez is a 72 y.o. male who presents to [facility] emergency department for evaluation of right-sided abdominal pain.  This began yesterday morning while he had friends over to help him install a washer and dryer in his new apartment.  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  He reports the pain improved somewhat with Tylenol and ibuprofen, but was still present when he went to sleep last night.  The pain worsened today, prompting his presentation.  He reports no associated nausea, vomiting, diarrhea, fever, or chills.  He is an active cigarette smoker.   Past Medical History:  Diagnosis Date   Diabetes mellitus without complication (HCC)    Hypertension    Stroke [facility])     History reviewed. No pertinent surgical history.  History reviewed. No pertinent family history.  Social History   Socioeconomic History   Marital status: Single    Spouse name: Not on file   Number of children: Not on file   Years of education: Not on file   Highest education level: Not on file  Occupational History   Not on file  Tobacco Use   Smoking status: Every Day    Current packs/day: 1.00    Average packs/day: 1 pack/day for 46.0 years (46.0 ttl pk-yrs)    Types: Cigarettes   Smokeless tobacco: Former  Building services engineer status: Never Used  Substance and Sexual Activity   Alcohol use: Not Currently   Drug use: Not Currently   Sexual activity: Not on file  Other Topics Concern   Not on file  Social History Narrative   Not on file   Social Drivers of Health   Financial Resource Strain: Low Risk   (09/17/2022)   Overall Financial Resource Strain (CARDIA)    Difficulty of Paying Living Expenses: Not hard at all  Food Insecurity: No Food Insecurity (09/17/2022)   Hunger Vital Sign    Worried About Running Out of Food in the Last Year: Never true    Ran Out of Food in the Last Year: Never true  Transportation Needs: No Transportation Needs (09/17/2022)   PRAPARE - Administrator, Civil Service (Medical): No    Lack of Transportation (Non-Medical): No  Physical Activity: Inactive (09/17/2022)   Exercise Vital Sign    Days of Exercise per Week: 0 days    Minutes of Exercise per Session: 0 min  Stress: No Stress Concern Present (09/17/2022)   [employer] of Occupational Health - Occupational Stress Questionnaire    Feeling of Stress : Not at all  Social Connections: Not on file  Intimate Partner Violence: Not on file    Allergies  Allergen Reactions   Bee Venom Swelling   Penicillins Rash    Current Facility-Administered Medications  Medication Dose Route Frequency Provider Last Rate Last Admin   glucose chewable tablet 4 g  1 tablet Oral Once Storm Frisk, MD       Current Outpatient Medications  Medication Sig Dispense Refill   Accu-Chek Softclix Lancets lancets Use to check blood sugar 3 times daily. 100 each 2   amLODipine (NORVASC)  5 MG tablet TAKE 1 TABLET EVERY DAY 90 tablet 3   ammonium lactate (AMLACTIN) 12 % cream Apply 1 Application topically as needed for dry skin. 385 g 5   aspirin EC 81 MG tablet Take 1 tablet (81 mg total) by mouth daily. 100 tablet 1   atorvastatin (LIPITOR) 10 MG tablet Take 1 tablet (10 mg total) by mouth daily. 90 tablet 3   Blood Glucose Monitoring Suppl (ACCU-CHEK GUIDE) w/Device KIT Use to check blood sugar 3 times daily. 1 kit 0   chlorthalidone (HYGROTON) 25 MG tablet TAKE 1 TABLET EVERY DAY 90 tablet 1   Continuous Glucose Sensor (FREESTYLE LIBRE 2 SENSOR) MISC Use to check blood sugar continuously throughout the  day. Change sensors once every 14 days. E11.22 6 each 2   dapagliflozin propanediol (FARXIGA) 10 MG TABS tablet Take 1 tablet (10 mg total) by mouth daily before breakfast. 90 tablet 3   diclofenac Sodium (VOLTAREN) 1 % GEL Apply 4 g topically 4 (four) times daily. 100 g 0   fenofibrate micronized (LOFIBRA) 134 MG capsule Take 1 capsule (134 mg total) by mouth daily before breakfast. 90 capsule 3   gabapentin (NEURONTIN) 100 MG capsule Take 1 capsule (100 mg total) by mouth 2 (two) times daily. Please mail 180 capsule 1   glucose blood (ACCU-CHEK GUIDE) test strip 1 each by Other route 3 (three) times daily. Use to check blood sugar 3 times daily. 300 each 3   insulin aspart (NOVOLOG FLEXPEN) 100 UNIT/ML FlexPen Max daily 75units 75 mL 3   insulin degludec (TRESIBA FLEXTOUCH) 200 UNIT/ML FlexTouch Pen Inject 64 Units into the skin daily in the afternoon. 45 mL 3   Insulin Pen Needle 31G X 5 MM MISC Use in the morning, at noon, in the evening, and at bedtime. 400 each 3   lactulose, encephalopathy, (CHRONULAC) 10 GM/15ML SOLN Take 15 mLs (10 g total) by mouth daily as needed (constipation). 480 mL 3   lidocaine (LIDODERM) 5 % Place 1 patch onto the skin daily. Remove & Discard patch within 12 hours or as directed by MD 15 patch 0   lisinopril (ZESTRIL) 2.5 MG tablet Take 1 tablet (2.5 mg total) by mouth daily. 90 tablet 3   methocarbamol (ROBAXIN) 750 MG tablet Take 1 tablet (750 mg total) by mouth every 8 (eight) hours as needed for muscle spasms. 60 tablet 1   nicotine (NICODERM CQ) 21 mg/24hr patch Place 1 patch (21 mg total) onto the skin daily. 28 patch 0   pioglitazone (ACTOS) 30 MG tablet Take 1 tablet (30 mg total) by mouth daily. 90 tablet 3    PHYSICAL EXAM Vitals:   07/18/23 1104 07/18/23 1108 07/18/23 1440  BP:  104/82 104/81  Pulse:  83 87  Resp:  20 18  Temp:  97.9 F (36.6 C) 97.9 F (36.6 C)  TempSrc:  Oral   SpO2:  100% 91%  Weight: 129.3 kg    Height: 5\' 7"  (1.702 m)      Obese man in no distress Regular rate and rhythm Unlabored breathing Abdomen tender to palpation especially over the right quadrants Palpable posterior tibial pulses bilaterally  PERTINENT LABORATORY AND RADIOLOGIC DATA  Most recent CBC    Latest Ref Rng & Units 07/18/2023   11:47 AM 03/17/2023    3:52 PM 05/23/2022    4:18 AM  CBC  WBC 4.0 - 10.5 K/uL 7.4  8.2  7.7   Hemoglobin 13.0 - 17.0 g/dL 16.2  13.9  14.5   Hematocrit 39.0 - 52.0 % 49.6  42.9  44.6   Platelets 150 - 400 K/uL 328  258  255      Most recent CMP    Latest Ref Rng & Units 07/18/2023   11:47 AM 03/17/2023    3:52 PM 10/22/2022   11:33 AM  CMP  Glucose 70 - 99 mg/dL 604  95  85   BUN 8 - 23 mg/dL 28  29  27    Creatinine 0.61 - 1.24 mg/dL 5.40  9.81  1.91   Sodium 135 - 145 mmol/L 138  138  141   Potassium 3.5 - 5.1 mmol/L 3.8  3.3  3.7   Chloride 98 - 111 mmol/L 99  103  99   CO2 22 - 32 mmol/L 28  22  24    Calcium 8.9 - 10.3 mg/dL 47.8  9.7  29.5   Total Protein 6.5 - 8.1 g/dL 7.7  7.0  7.4   Total Bilirubin <1.2 mg/dL 0.7  0.2  0.3   Alkaline Phos 38 - 126 U/L 32  44  46   AST 15 - 41 U/L 32  26  26   ALT 0 - 44 U/L 26  24  24      Renal function Estimated Creatinine Clearance: 51.4 mL/min (A) (by C-G formula based on SCr of 1.73 mg/dL (H)).  Hemoglobin A1C (%)  Date Value  05/04/2023 6.8 (A)   HbA1c, POC (controlled diabetic range) (%)  Date Value  03/04/2023 7.5 (A)    LDL Chol Calc (NIH)  Date Value Ref Range Status  10/22/2022 71 0 - 99 mg/dL Final    CT scan of the abdomen pelvis obtained earlier today personally reviewed.  There is a 73 mm infrarenal abdominal aortic aneurysm with suitable infrarenal neck for endovascular aortic repair.  The radiologist notes some stranding about the aneurysm sac, which is very subtle.  Rande Brunt. Lenell Antu, MD [facility] Phone Number: [phone] 07/18/2023 6:27 PM   Total time spent on preparing  this encounter including chart review, data review, collecting history, examining the patient, coordinating care for this new patient, 80 minutes.  Portions of this report may have been transcribed using voice recognition software.  Every effort has been made to ensure accuracy; however, inadvertent computerized transcription errors may still be present.

## 2023-07-18 NOTE — ED Provider Triage Note (Signed)
Emergency Medicine Provider Triage Evaluation Note  Reginald Rodriguez , a 72 y.o. male  was evaluated in triage.  Pt complains of abdominal pain.  Review of Systems  Positive: Right upper quadrant pain, nausea, constipation, right-sided chest pain Negative: Shortness of breath, fever, vomiting, diarrhea  Physical Exam  BP 104/82 (BP Location: Right Arm)   Pulse 83   Temp 97.9 F (36.6 C) (Oral)   Resp 20   Ht 5\' 7"  (1.702 m)   Wt 129.3 kg   SpO2 100%   BMI 44.64 kg/m  Gen:   Awake, no distress   Resp:  Normal effort  MSK:   Moves extremities without difficulty  Other:  Right upper quadrant tenderness to palpation on exam  Medical Decision Making  Medically screening exam initiated at 11:20 AM.  Appropriate orders placed.  Reginald Rodriguez was informed that the remainder of the evaluation will be completed by another provider, this initial triage assessment does not replace that evaluation, and the importance of remaining in the ED until their evaluation is complete.  Patient presenting with right upper quadrant pain since last night.  Radiates to the right flank.  He has some associated right-sided chest pain as well but is primarily abdominal pain.  No shortness of breath.  No history of PE or DVT as far as he is aware.  He has also been constipated.  I am concerned for possible cholecystitis, lab work and CT ordered.   Reginald Spates, MD 07/18/23 1130

## 2023-07-18 NOTE — Transfer of Care (Signed)
Immediate Anesthesia Transfer of Care Note  Patient: Reginald Rodriguez  Procedure(s) Performed: ABDOMINAL AORTIC ENDOVASCULAR STENT GRAFT ULTRASOUND GUIDANCE FOR VASCULAR ACCESS (Bilateral: Groin)  Patient Location: PACU  Anesthesia Type:General  Level of Consciousness: drowsy  Airway & Oxygen Therapy: Patient Spontanous Breathing and Patient connected to face mask oxygen  Post-op Assessment: Report given to RN and Post -op Vital signs reviewed and stable  Post vital signs: Reviewed and stable  Last Vitals:  Vitals Value Taken Time  BP 117/79 07/18/23 2230  Temp    Pulse 79 07/18/23 2234  Resp 21 07/18/23 2234  SpO2 94 % 07/18/23 2234  Vitals shown include unfiled device data.  Last Pain:  Vitals:   07/18/23 1913  TempSrc: Oral  PainSc:          Complications: No notable events documented.

## 2023-07-18 NOTE — Op Note (Signed)
DATE OF SERVICE: 07/18/2023  PATIENT:  Reginald Rodriguez  72 y.o. male  PRE-OPERATIVE DIAGNOSIS:  symptomatic infrarenal abdominal aortic aneurysm  POST-OPERATIVE DIAGNOSIS:  Same  PROCEDURE:   1) bilateral ultrasound guided common femoral artery access and large bore percutaneous closure (CPT [phone]) 2) aorto-bi-iliac stent graft repair of abdominal aortic aneurysm (CPT [phone]) 3) extension of repair onto iliac arteries x2 (CPT [phone])  SURGEON:  Surgeons and Role:    * Leonie Douglas, MD - Primary  ASSISTANT: Nathanial Rancher, PA-C  An experienced assistant was required given the complexity of this procedure and the standard of surgical care. My assistant helped with exposure through counter tension, suctioning, ligation and retraction to better visualize the surgical field.  My assistant expedited sewing during the case by following my sutures. Wherever I use the term "we" in the report, my assistant actively helped me with that portion of the procedure.  ANESTHESIA:   general  EBL:  BLOOD ADMINISTERED:none  DRAINS: none   LOCAL MEDICATIONS USED:  NONE  SPECIMEN:  none  COUNTS: confirmed correct.  TOURNIQUET:  none  PATIENT DISPOSITION:  PACU - hemodynamically stable.   Delay start of Pharmacological VTE agent (>24hrs) due to surgical blood loss or risk of bleeding: no  INDICATION FOR PROCEDURE: Reginald Rodriguez is a 72 y.o. male with 73mm infrarenal abdominal aortic aneurysm. The patient has severe abdominal pain with no other clear cause. He has some evidence of stranding about the aneurysm. After careful discussion of risks, benefits, and alternatives the patient was offered EVAR.  The patient understood and wished to proceed. Prior to informed consent, the patient developed respiratory distress in the preoperative area, and so he was taken to the OR emergently.  OPERATIVE FINDINGS: aneurysm excluded by EVAR; no endoleak at completion. Brisk doppler flow in both feet at end  of case.  ENDOPROSTHESES: 28 x 14 x 120 mm Gore excluder main body via left common femoral artery access 16 x 135 mm Gore iliac extension limb via left common femoral artery 12 x 70 mm Gore iliac extension limb "bridge" via left common femoral artery 16 x 115 mm Gore iliac extension limb via right common femoral artery access  DESCRIPTION OF PROCEDURE: After identification of the patient in the pre-operative holding area, the patient was transferred to the operating room. The patient was positioned supine on the operating room table. Anesthesia was induced. The abdomen and groins were prepped and draped in standard fashion. A surgical pause was performed confirming correct patient, procedure, and operative location.  Using duplex ultrasound, the left common femoral artery was accessed with micropuncture technique.  Using Seldinger technique a micro sheath was introduced in the common femoral artery.  A Bentson wire was easily navigated into the perivisceral aorta.  The tract was dilated with the dilator of an 8 [place] sheath.  A small incision was made around the dilator.  Perclose sutures were placed at 10:00 and 2:00 for use at the end of the case.  An 8 French sheath was delivered through the arteriotomy.  The arteriotomy was hemostatic.  Using duplex ultrasound, the right common femoral artery was accessed with micropuncture technique.  Using Seldinger technique a micro sheath was introduced in the common femoral artery.  A Bentson wire was easily navigated into the perivisceral aorta.  The tract was dilated with the dilator of an 8 [place] sheath.  A small incision was made around the dilator.  Perclose sutures were placed at 10:00 and 2:00 for use  at the end of the case.  An 8 French sheath was delivered through the arteriotomy.  The arteriotomy was hemostatic.  The patient was systemically heparinized.  Activated clotting time measurements reassess case to confirm adequate anticoagulation.   Bentson wires were navigated into the descending thoracic aorta.  Kumpe catheters were used to exchange the wires for Lunderquist wires.  Access was upsized to 18 [place] dry seal on the left.  Access was upsized to 12 [place] dry seal on the right.  The sheaths were position in the terminal aorta.  Via the right sided access a pigtail catheter was advanced above the area of the neck of the aneurysm around L2.  Through the left-sided access a 28 x 14 x 120 mm Gore excluder main body was advanced to the presumed area of the aortic neck.  Abdominal aortogram was performed.  The renal anatomy was marked.  The main body of the device was deployed with the gate configured in a "ballerina" configuration to permit easier cannulation.  Contralateral gate was very difficult to cannulate.  Ultimately we reconstructed the device and reposition the gate to allow for more easy cannulation.  A Vanshee 3 catheter and a Glidewire were used to cannulate the gate.  After cannulating the gate, we spun a pigtail catheter in the main body of the device to confirm accurate cannulation.  A repeat aortogram was used to confirm the main body was in good position.  We did reposition device upward about 3 mm and reconfirmed its position with aortogram.  Satisfied with this position we extended the repair onto the iliac arteries.  The pigtail catheter was brought down the left-sided access and a retrograde angiogram was performed through the left sheath.  The hypogastric anatomy was marked.  A 12 x 70 mm Gore iliac extension limb was used as a "bridge" piece, this was followed by 16 x 135 mm iliac extension limb to the iliac bifurcation.  The main body deployment was completed.  I retrograde iliac angiogram was performed via the right femoral access.  A 16 x 115 mm iliac extension limb was deployed and landed just prior to the iliac bifurcation.  A molding and occlusion balloon was used to angioplasty all areas of seal and overlap.   The pigtail catheter was advanced over the top of the graft and abdominal aortogram was performed.  Abdominal aortic gram showed no evidence of endoleak at completion. Both hypogastric arteries and external iliac arteries appeared patent.  Both renal arteries were patent.  Satisfied we ended the case here.  All endovascular equipment was removed.  The previously placed Perclose sutures were secured down.  This rendered both arteriotomies hemostatic.  The wound was closed with 4-0 Monocryl.  Doppler flow was confirmed in the feet.  Heparin was reversed with protamine.  Upon completion of the case instrument and sharps counts were confirmed correct. The patient was transferred to the PACU in good condition. I was present for all portions of the procedure.  FOLLOW UP PLAN: Assuming a normal postoperative course, VVS PA will see the patient in 4 weeks with CT angiogram of abdomen / pelvis.   Rande Brunt. Lenell Antu, MD [facility] of [facility] Phone Number: [phone] 07/18/2023 9:51 PM

## 2023-07-18 NOTE — ED Triage Notes (Signed)
Pt to ED via [facility] from home c/o abdominal pain that started last night. Reports RUQ pain, tender to touch, abdominal distention.  No urinary symptoms. No N/V/D.  No hx of abdominal sx.  Abnormal gait secondary to previous stroke.  Last VS: 120/70, HR 87, RR24. 98%RA, CBG 163  No medications given by EMS.

## 2023-07-18 NOTE — ED Notes (Signed)
Pt verbally abusive to this RN, yelling screaming. Not open to support, refusing to allow this RN to obtain IV access. NT successful in collecting ordered labs.

## 2023-07-18 NOTE — Anesthesia Procedure Notes (Signed)
Procedure Name: Intubation Date/Time: 07/18/2023 7:32 PM  Performed by: Tressia Miners, CRNAPre-anesthesia Checklist: Patient identified, Emergency Drugs available, Suction available, Patient being monitored and Timeout performed Patient Re-evaluated:Patient Re-evaluated prior to induction Oxygen Delivery Method: Circle system utilized Preoxygenation: Pre-oxygenation with 100% oxygen Induction Type: IV induction Ventilation: Mask ventilation without difficulty and Oral airway inserted - appropriate to patient size Laryngoscope Size: 4 and Glidescope Grade View: Grade I Tube type: Oral Tube size: 7.5 mm Number of attempts: 1 Airway Equipment and Method: Video-laryngoscopy and Rigid stylet Placement Confirmation: ETT inserted through vocal cords under direct vision, positive ETCO2 and breath sounds checked- equal and bilateral Secured at: 23 cm Tube secured with: Tape Dental Injury: Teeth and Oropharynx as per pre-operative assessment  Comments: Smooth IV Induction. Eyes taped. Easy mask with oral airway. DL x 1 with grade 1 view. Atraumatically placed, teeth and lip remain intact as pre-op. Secured with tape. Bilateral breath sounds +/=, EtCO2 +, Adequate TV, VSS.

## 2023-07-18 NOTE — Discharge Instructions (Signed)
  [facility] [place]   Discharge Instructions  Endovascular Aortic Aneurysm Repair  Please refer to the following instructions for your post-procedure care. Your surgeon or Physician Assistant will discuss any changes with you.  Activity  You are encouraged to walk as much as you can. You can slowly return to normal activities but must avoid strenuous activity and heavy lifting until your doctor tells you it's OK. Avoid activities such as vacuuming or swinging a gold club. It is normal to feel tired for several weeks after your surgery. Do not drive until your doctor gives the OK and you are no longer taking prescription pain medications. It is also normal to have difficulty with sleep habits, eating, and bowel movements after surgery. These will go away with time.  Bathing/Showering  Shower daily after you go home.  Do not soak in a bathtub, hot tub, or swim until the incision heals completely.  If you have incisions in your groin, wash the groin wounds with soap and water daily and pat dry. (No tub bath-only shower)  Then put a dry gauze or washcloth there to keep this area dry to help prevent wound infection daily and as needed.  Do not use Vaseline or neosporin on your incisions.  Only use soap and water on your incisions and then protect and keep dry.  Incision Care  Shower every day. Clean your incision with mild soap and water. Pat the area dry with a clean towel. You do not need a bandage unless otherwise instructed. Do not apply any ointments or creams to your incision. If you clothing is irritating, you may cover your incision with a dry gauze pad.  Diet  Resume your normal diet. There are no special food restrictions following this procedure. A low fat/low cholesterol diet is recommended for all patients with vascular disease. In order to heal from your surgery, it is CRITICAL to get adequate nutrition. Your body requires vitamins, minerals, and protein.  Vegetables are the best source of vitamins and minerals. Vegetables also provide the perfect balance of protein. Processed food has little nutritional value, so try to avoid this.  Medications  Resume taking all of your medications unless your doctor or nurse practitioner tells you not to. If your incision is causing pain, you may take over-the-counter pain relievers such as acetaminophen (Tylenol). If you were prescribed a stronger pain medication, please be aware these medications can cause nausea and constipation. Prevent nausea by taking the medication with a snack or meal. Avoid constipation by drinking plenty of fluids and eating foods with a high amount of fiber, such as fruits, vegetables, and grains.  Do not take Tylenol if you are taking prescription pain medications.   Follow up  Our office will schedule a follow-up appointment with a CT scan 3-4 weeks after your surgery.  Please call us immediately for any of the following conditions  Severe or worsening pain in your legs or feet or in your abdomen back or chest. Increased pain, redness, drainage (pus) from your incision site. Increased abdominal pain, bloating, nausea, vomiting or persistent diarrhea. Fever of 101 degrees or higher. Swelling in your leg (s),  Reduce your risk of vascular disease  Stop smoking. If you would like help call [facility] at 1-800-QUIT-NOW (1-800-784-8669) or [place] at [phone]. Manage your cholesterol Maintain a desired weight Control your diabetes Keep your blood pressure down  If you have questions, please call the office at [phone].  

## 2023-07-18 NOTE — Anesthesia Postprocedure Evaluation (Signed)
Anesthesia Post Note  Patient: Reginald Rodriguez  Procedure(s) Performed: ABDOMINAL AORTIC ENDOVASCULAR STENT GRAFT ULTRASOUND GUIDANCE FOR VASCULAR ACCESS (Bilateral: Groin)     Patient location during evaluation: PACU Anesthesia Type: General Level of consciousness: awake and alert Pain management: pain level controlled Vital Signs Assessment: post-procedure vital signs reviewed and stable Respiratory status: spontaneous breathing, nonlabored ventilation, respiratory function stable and patient connected to nasal cannula oxygen Cardiovascular status: blood pressure returned to baseline and stable Postop Assessment: no apparent nausea or vomiting Anesthetic complications: no   No notable events documented.  Last Vitals:  Vitals:   07/18/23 2250 07/18/23 2305  BP: (!) 114/94 105/73  Pulse: 77 76  Resp: 15 16  Temp:  36.7 C  SpO2: 93% 93%    Last Pain:  Vitals:   07/18/23 2305  TempSrc:   PainSc: 0-No pain                 Pierre Nation

## 2023-07-18 NOTE — ED Provider Notes (Signed)
[facility] Provider Note   CSN: [id number] Arrival date & time: 07/18/23  1058     History  Chief Complaint  Patient presents with   Abdominal Pain    Reginald Rodriguez is a 72 y.o. male here presenting with abdominal pain.  Patient has acute onset of right upper quadrant pain since this morning.  Patient states that he just feels very uncomfortable and nauseated.  Denies any chest pain.  Has history of hypertension but no previous abdominal surgeries  The history is provided by the patient.       Home Medications Prior to Admission medications   Medication Sig Start Date End Date Taking? Authorizing Provider  Accu-Chek Softclix Lancets lancets Use to check blood sugar 3 times daily. 04/15/22   Storm Frisk, MD  amLODipine (NORVASC) 5 MG tablet TAKE 1 TABLET EVERY DAY 03/25/23   Storm Frisk, MD  ammonium lactate (AMLACTIN) 12 % cream Apply 1 Application topically as needed for dry skin. 10/07/22   Edwin Cap, DPM  aspirin EC 81 MG tablet Take 1 tablet (81 mg total) by mouth daily. 10/22/22   Storm Frisk, MD  atorvastatin (LIPITOR) 10 MG tablet Take 1 tablet (10 mg total) by mouth daily. 10/22/22   Storm Frisk, MD  Blood Glucose Monitoring Suppl (ACCU-CHEK GUIDE) w/Device KIT Use to check blood sugar 3 times daily. 04/15/22   Storm Frisk, MD  chlorthalidone (HYGROTON) 25 MG tablet TAKE 1 TABLET EVERY DAY 06/07/23   Claiborne Rigg, NP  Continuous Glucose Sensor (FREESTYLE LIBRE 2 SENSOR) MISC Use to check blood sugar continuously throughout the day. Change sensors once every 14 days. E11.22 12/30/22   Storm Frisk, MD  dapagliflozin propanediol (FARXIGA) 10 MG TABS tablet Take 1 tablet (10 mg total) by mouth daily before breakfast. 05/04/23   Shamleffer, Konrad Dolores, MD  diclofenac Sodium (VOLTAREN) 1 % GEL Apply 4 g topically 4 (four) times daily. 05/23/22   Melene Plan, DO  fenofibrate micronized (LOFIBRA) 134 MG capsule Take 1  capsule (134 mg total) by mouth daily before breakfast. 10/22/22 10/17/23  Storm Frisk, MD  gabapentin (NEURONTIN) 100 MG capsule Take 1 capsule (100 mg total) by mouth 2 (two) times daily. Please mail 05/27/23   Hoy Register, MD  glucose blood (ACCU-CHEK GUIDE) test strip 1 each by Reginald route 3 (three) times daily. Use to check blood sugar 3 times daily. 04/17/22   Shamleffer, Konrad Dolores, MD  insulin aspart (NOVOLOG FLEXPEN) 100 UNIT/ML FlexPen Max daily 75units 05/04/23   Shamleffer, Konrad Dolores, MD  insulin degludec (TRESIBA FLEXTOUCH) 200 UNIT/ML FlexTouch Pen Inject 64 Units into the skin daily in the afternoon. 05/04/23   Shamleffer, Konrad Dolores, MD  Insulin Pen Needle 31G X 5 MM MISC Use in the morning, at noon, in the evening, and at bedtime. 05/04/23   Shamleffer, Konrad Dolores, MD  lactulose, encephalopathy, (CHRONULAC) 10 GM/15ML SOLN Take 15 mLs (10 g total) by mouth daily as needed (constipation). 06/16/21   Storm Frisk, MD  lidocaine (LIDODERM) 5 % Place 1 patch onto the skin daily. Remove & Discard patch within 12 hours or as directed by MD 04/11/23   Tegeler, Canary Brim, MD  lisinopril (ZESTRIL) 2.5 MG tablet Take 1 tablet (2.5 mg total) by mouth daily. 10/22/22   Storm Frisk, MD  methocarbamol (ROBAXIN) 750 MG tablet Take 1 tablet (750 mg total) by mouth every 8 (eight) hours as needed for muscle  spasms. 04/28/23   Claiborne Rigg, NP  nicotine (NICODERM CQ) 21 mg/24hr patch Place 1 patch (21 mg total) onto the skin daily. 01/20/23   Storm Frisk, MD  pioglitazone (ACTOS) 30 MG tablet Take 1 tablet (30 mg total) by mouth daily. 05/04/23   Shamleffer, Konrad Dolores, MD      Allergies    Bee venom and Penicillins    Review of Systems   Review of Systems  Gastrointestinal:  Positive for abdominal pain.  All Reginald systems reviewed and are negative.   Physical Exam Updated Vital Signs BP (!) 146/116 (BP Location: Right Arm)   Pulse 82   Temp  98.2 F (36.8 C) (Oral)   Resp 19   Ht 5\' 7"  (1.702 m)   Wt 129.3 kg   SpO2 100%   BMI 44.64 kg/m  Physical Exam Vitals and nursing note reviewed.  Constitutional:      Comments: Uncomfortable  HENT:     Head: Normocephalic.     Mouth/Throat:     Mouth: Mucous membranes are moist.  Eyes:     Extraocular Movements: Extraocular movements intact.     Pupils: Pupils are equal, round, and reactive to light.  Cardiovascular:     Rate and Rhythm: Normal rate and regular rhythm.     Heart sounds: Normal heart sounds.  Pulmonary:     Effort: Pulmonary effort is normal.     Breath sounds: Normal breath sounds.  Abdominal:     Comments: Patient has a pulsatile mass that is tender  Skin:    General: Skin is warm.     Capillary Refill: Capillary refill takes less than 2 seconds.  Neurological:     General: No focal deficit present.  Psychiatric:        Mood and Affect: Mood normal.        Behavior: Behavior normal.     ED Results / Procedures / Treatments   Labs (all labs ordered are listed, but only abnormal results are displayed) Labs Reviewed  COMPREHENSIVE METABOLIC PANEL - Abnormal; Notable for the following components:      Result Value   Glucose, Bld 111 (*)    BUN 28 (*)    Creatinine, Ser 1.73 (*)    Calcium 10.6 (*)    Alkaline Phosphatase 32 (*)    GFR, Estimated 42 (*)    All Reginald components within normal limits  URINALYSIS, ROUTINE W REFLEX MICROSCOPIC - Abnormal; Notable for the following components:   Glucose, UA >=500 (*)    Bacteria, UA RARE (*)    All Reginald components within normal limits  LIPASE, BLOOD  CBC  TYPE AND SCREEN  PREPARE RBC (CROSSMATCH)  ABO/RH  TROPONIN I (HIGH SENSITIVITY)  TROPONIN I (HIGH SENSITIVITY)    EKG EKG Interpretation Date/Time:  Sunday July 18 2023 11:27:18 EST Ventricular Rate:  93 PR Interval:  158 QRS Duration:  78 QT Interval:  376 QTC Calculation: 467 R Axis:   12  Text Interpretation: Sinus rhythm  with Premature atrial complexes and Premature ventricular complexes or Fusion complexes Nonspecific T wave abnormality Prolonged QT Abnormal ECG When compared with ECG of 23-May-2022 04:09, PVC new since previous Confirmed by Richardean Canal [phone]) on 07/18/2023 4:23:58 PM  Radiology HYBRID OR IMAGING ([facility] ONLY) Result Date: 07/18/2023 There is no interpretation for this exam.  This order is for images obtained during a surgical procedure.  Please See "Surgeries" Tab for more information regarding the procedure.  CT ABDOMEN PELVIS W CONTRAST Result Date: 07/18/2023 CLINICAL DATA:  Acute abdominal pain on the right EXAM: CT ABDOMEN AND PELVIS WITH CONTRAST TECHNIQUE: Multidetector CT imaging of the abdomen and pelvis was performed using the standard protocol following bolus administration of intravenous contrast. RADIATION DOSE REDUCTION: This exam was performed according to the departmental dose-optimization program which includes automated exposure control, adjustment of the mA and/or kV according to patient size and/or use of iterative reconstruction technique. CONTRAST:  55mL OMNIPAQUE IOHEXOL 350 MG/ML SOLN COMPARISON:  None Available. FINDINGS: Lower chest: No acute abnormality. Hepatobiliary: Fatty infiltration of the liver is noted. The gallbladder is within normal limits. Pancreas: Unremarkable. No pancreatic ductal dilatation or surrounding inflammatory changes. Spleen: Normal in size without focal abnormality. 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 Given their stability, no follow-up is recommended. The kidneys demonstrate a normal enhancement pattern bilaterally. No renal calculi or obstructive changes are noted. The bladder is partially distended. Stomach/Bowel: Scattered diverticular change of the colon is noted without evidence of diverticulitis. No obstructive or inflammatory changes of the colon are seen. The  appendix is within normal limits and air filled. No inflammatory changes are noted. Small bowel and stomach are unremarkable. Vascular/Lymphatic: Atherosclerotic calcifications of the aorta are noted. There is an infrarenal abdominal aortic aneurysm which measures 7.3 cm in transverse dimension and 6.7 cm in AP dimension. The mural thrombus is mildly hyperdense and some very mild Peri aortic stranding is seen suspicious for impending rupture. No lymphadenopathy is identified. Reproductive: Prostate is unremarkable. Reginald: No abdominal wall hernia or abnormality. No abdominopelvic ascites. Musculoskeletal: No acute or significant osseous findings. IMPRESSION: Abdominal aortic aneurysm measuring up to 7.3 cm in transverse diameter as described. The circumferential mural thrombus is mildly hyperdense and mild Peri aortic stranding is identified. These findings are suspicious for impending rupture. Vascular surgery consultation is recommended. Diverticulosis without diverticulitis. Fatty liver. Critical Value/emergent results were called by telephone at the time of interpretation on 07/18/2023 at 5:50 pm to Dr. Fulton Reek , who verbally acknowledged these results. Electronically Signed   By: Alcide Clever M.D.   On: 07/18/2023 17:51   DG Chest 2 View Result Date: 07/18/2023 CLINICAL DATA:  Chest pain EXAM: CHEST - 2 VIEW COMPARISON:  05/23/2022. FINDINGS: Cardiac silhouette is unremarkable. No pneumothorax or pleural effusion. The lungs are clear. Aorta is calcified, ectatic and tortuous. The visualized skeletal structures are unremarkable. IMPRESSION: No acute cardiopulmonary process. Electronically Signed   By: Layla Maw M.D.   On: 07/18/2023 12:09    Procedures Procedures    CRITICAL CARE Performed by: Richardean Canal   Total critical care time: 39 minutes  Critical care time was exclusive of separately billable procedures and treating Reginald patients.  Critical care was necessary to treat or  prevent imminent or life-threatening deterioration.  Critical care was time spent personally by me on the following activities: development of treatment plan with patient and/or surrogate as well as nursing, discussions with consultants, evaluation of patient's response to treatment, examination of patient, obtaining history from patient or surrogate, ordering and performing treatments and interventions, ordering and review of laboratory studies, ordering and review of radiographic studies, pulse oximetry and re-evaluation of patient's condition.   Medications Ordered in ED Medications  0.9 %  sodium chloride infusion (Manually program via Guardrails IV Fluids) ( Intravenous MAR Hold 07/18/23 1922)  iohexol (OMNIPAQUE) 350 MG/ML injection 55 mL (55 mLs Intravenous Contrast Given 07/18/23  1713)  fentaNYL (SUBLIMAZE) injection 25 mcg (25 mcg Intravenous Given 07/18/23 1802)  sodium chloride 0.9 % bolus 1,000 mL (1,000 mLs Intravenous New Bag/Given 07/18/23 1802)    ED Course/ Medical Decision Making/ A&P                                 Medical Decision Making Othor Rodriguez is a 72 y.o. male here presenting with abdominal pain.  Concern for possible AAA versus biliary colic.  Plan to get CBC and CMP and CT abdomen pelvis  6 pm CT showed 7.3 cm AAA with mural thrombus and possible rupture.  Given IV antibiotics.  Consulted Dr. Juanetta Gosling from vascular surgery.  He states that he will see the patient and likely take patient to the OR  7:37 PM Patient went to OR with vascular surgery    Amount and/or Complexity of Data Reviewed Labs: ordered. Decision-making details documented in ED Course.  Risk Prescription drug management. Decision regarding hospitalization.    Final Clinical Impression(s) / ED Diagnoses Final diagnoses:  Abdominal aortic aneurysm (AAA), unspecified part, unspecified whether ruptured [facility])    Rx / DC Orders ED Discharge Orders     None         Charlynne Pander, MD 07/18/23 [phone]

## 2023-07-19 ENCOUNTER — Other Ambulatory Visit (HOSPITAL_COMMUNITY): Payer: Self-pay

## 2023-07-19 ENCOUNTER — Encounter (HOSPITAL_COMMUNITY): Payer: Self-pay | Admitting: Vascular Surgery

## 2023-07-19 LAB — BASIC METABOLIC PANEL
Anion gap: 12 (ref 5–15)
BUN: 26 mg/dL — ABNORMAL HIGH (ref 8–23)
CO2: 24 mmol/L (ref 22–32)
Calcium: 9.3 mg/dL (ref 8.9–10.3)
Chloride: 101 mmol/L (ref 98–111)
Creatinine, Ser: 1.89 mg/dL — ABNORMAL HIGH (ref 0.61–1.24)
GFR, Estimated: 38 mL/min — ABNORMAL LOW (ref >60)
Glucose, Bld: 161 mg/dL — ABNORMAL HIGH (ref 70–99)
Potassium: 3.6 mmol/L (ref 3.5–5.1)
Sodium: 137 mmol/L (ref 135–145)

## 2023-07-19 LAB — MRSA NEXT GEN BY PCR, NASAL: MRSA by PCR Next Gen: NOT DETECTED

## 2023-07-19 LAB — CBC
HCT: 40.9 % (ref 39.0–52.0)
Hemoglobin: 13.7 g/dL (ref 13.0–17.0)
MCH: 30.4 pg (ref 26.0–34.0)
MCHC: 33.5 g/dL (ref 30.0–36.0)
MCV: 90.7 fL (ref 80.0–100.0)
Platelets: 270 10*3/uL (ref 150–400)
RBC: 4.51 MIL/uL (ref 4.22–5.81)
RDW: 15.5 % (ref 11.5–15.5)
WBC: 9.2 10*3/uL (ref 4.0–10.5)
nRBC: 0 % (ref 0.0–0.2)

## 2023-07-19 LAB — GLUCOSE, CAPILLARY
Glucose-Capillary: 216 mg/dL — ABNORMAL HIGH (ref 70–99)
Glucose-Capillary: 229 mg/dL — ABNORMAL HIGH (ref 70–99)
Glucose-Capillary: 115 mg/dL — ABNORMAL HIGH (ref 70–99)

## 2023-07-19 MED ORDER — OXYCODONE HCL 5 MG PO TABS
5.0000 mg | ORAL_TABLET | Freq: Four times a day (QID) | ORAL | 0 refills | Status: DC | PRN
  Filled 2023-07-19: qty 20, 5d supply, fill #0

## 2023-07-19 NOTE — Plan of Care (Signed)
  Problem: Education: Goal: Knowledge of General Education information will improve Description: Including pain rating scale, medication(s)/side effects and non-pharmacologic comfort measures Outcome: Completed/Met   Problem: Health Behavior/Discharge Planning: Goal: Ability to manage health-related needs will improve Outcome: Completed/Met   Problem: Clinical Measurements: Goal: Ability to maintain clinical measurements within normal limits will improve Outcome: Adequate for Discharge Goal: Will remain free from infection Outcome: Adequate for Discharge Goal: Diagnostic test results will improve Outcome: Adequate for Discharge Goal: Respiratory complications will improve Outcome: Adequate for Discharge Goal: Cardiovascular complication will be avoided Outcome: Adequate for Discharge   Problem: Activity: Goal: Risk for activity intolerance will decrease Outcome: Adequate for Discharge   Problem: Clinical Measurements: Goal: Ability to maintain clinical measurements within normal limits will improve Outcome: Adequate for Discharge Goal: Will remain free from infection Outcome: Adequate for Discharge Goal: Diagnostic test results will improve Outcome: Adequate for Discharge Goal: Respiratory complications will improve Outcome: Adequate for Discharge Goal: Cardiovascular complication will be avoided Outcome: Adequate for Discharge   Problem: Health Behavior/Discharge Planning: Goal: Ability to manage health-related needs will improve Outcome: Completed/Met   Problem: Nutrition: Goal: Adequate nutrition will be maintained Outcome: Adequate for Discharge   Problem: Coping: Goal: Level of anxiety will decrease Outcome: Adequate for Discharge   Problem: Elimination: Goal: Will not experience complications related to bowel motility Outcome: Adequate for Discharge Goal: Will not experience complications related to urinary retention Outcome: Adequate for Discharge    Problem: Pain Management: Goal: General experience of comfort will improve Outcome: Adequate for Discharge   Problem: Safety: Goal: Ability to remain free from injury will improve Outcome: Adequate for Discharge   Problem: Skin Integrity: Goal: Risk for impaired skin integrity will decrease Outcome: Adequate for Discharge   Problem: Education: Goal: Knowledge of discharge needs will improve Outcome: Adequate for Discharge   Problem: Clinical Measurements: Goal: Postoperative complications will be avoided or minimized Outcome: Adequate for Discharge   Problem: Respiratory: Goal: Ability to achieve and maintain a regular respiratory rate will improve Outcome: Adequate for Discharge   Problem: Skin Integrity: Goal: Demonstration of wound healing without infection will improve Outcome: Adequate for Discharge   Problem: Education: Goal: Ability to describe self-care measures that may prevent or decrease complications (Diabetes Survival Skills Education) will improve Outcome: Adequate for Discharge Goal: Individualized Educational Video(s) Outcome: Adequate for Discharge   Problem: Coping: Goal: Ability to adjust to condition or change in health will improve Outcome: Adequate for Discharge   Problem: Fluid Volume: Goal: Ability to maintain a balanced intake and output will improve Outcome: Adequate for Discharge   Problem: Health Behavior/Discharge Planning: Goal: Ability to identify and utilize available resources and services will improve Outcome: Adequate for Discharge Goal: Ability to manage health-related needs will improve Outcome: Adequate for Discharge   Problem: Metabolic: Goal: Ability to maintain appropriate glucose levels will improve Outcome: Adequate for Discharge   Problem: Nutritional: Goal: Maintenance of adequate nutrition will improve Outcome: Adequate for Discharge Goal: Progress toward achieving an optimal weight will improve Outcome: Adequate  for Discharge   Problem: Skin Integrity: Goal: Risk for impaired skin integrity will decrease Outcome: Adequate for Discharge   Problem: Tissue Perfusion: Goal: Adequacy of tissue perfusion will improve Outcome: Adequate for Discharge

## 2023-07-19 NOTE — Evaluation (Signed)
Occupational Therapy Evaluation Patient Details Name: Reginald Rodriguez MRN: [id number] DOB: 01-Dec-1950 Today's Date: 07/19/2023   History of Present Illness Reginald Rodriguez is a 72 y.o. male who presented to ED with R abd pain. 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 PMH: obesity, diabetes, CVA   Clinical Impression   PTA, pt lived alone and was mod I for ADL and light meal prep; HH aide 3 days/week for 2 hours/day who assisted with cleaning, meal prep, grocery shopping. Upon eval, pt with DOE, decreased activity tolerance, and decreased self initiation of self pacing. Reviewed LB AE for dressing and bathing, and EC techniques. Pt very motivated to optimize activity pacing and planning ahead. Encouraged pt to walk frequently within safe means. Will continue to follow. Recommending HHOT at discharge.     If plan is discharge home, recommend the following: A little help with walking and/or transfers;A little help with bathing/dressing/bathroom;Assistance with cooking/housework;Assist for transportation;Help with stairs or ramp for entrance    Functional Status Assessment  Patient has had a recent decline in their functional status and demonstrates the ability to make significant improvements in function in a reasonable and predictable amount of time.  Equipment Recommendations  Tub/shower seat    Recommendations for Other Services       Precautions / Restrictions Restrictions Weight Bearing Restrictions Per Provider Order: No      Mobility Bed Mobility               General bed mobility comments: pt received up in chair    Transfers Overall transfer level: Modified independent Equipment used: None               General transfer comment: pt with wide base of support, rocking momentum, incresaed time, labored effort/breathing      Balance Overall balance assessment: Needs assistance Sitting-balance support: Feet supported, No upper extremity  supported Sitting balance-Leahy Scale: Good     Standing balance support: Bilateral upper extremity supported, During functional activity Standing balance-Leahy Scale: Fair                             ADL either performed or assessed with clinical judgement   ADL Overall ADL's : Needs assistance/impaired Eating/Feeding: Set up;Sitting   Grooming: Contact guard assist;Standing   Upper Body Bathing: Set up;Sitting   Lower Body Bathing: Contact guard assist;Sit to/from stand;With adaptive equipment   Upper Body Dressing : Set up;Sitting   Lower Body Dressing: Minimal assistance;With adaptive equipment;Sit to/from stand Lower Body Dressing Details (indicate cue type and reason): min A for use of sock aide. Otherwise CGA Toilet Transfer: Contact guard assist;Ambulation (EVA)           Functional mobility during ADLs: Contact guard assist       Vision Baseline Vision/History: 1 Wears glasses Patient Visual Report: No change from baseline Vision Assessment?: No apparent visual deficits Additional Comments: not formally assessed     Perception Perception: Not tested       Praxis Praxis: Not tested       Pertinent Vitals/Pain Pain Assessment Pain Assessment: Faces Faces Pain Scale: Hurts even more Pain Location: abdomen when coughing Pain Descriptors / Indicators: Constant Pain Intervention(s): Limited activity within patient's tolerance, Monitored during session     Extremity/Trunk Assessment Upper Extremity Assessment Upper Extremity Assessment: Overall WFL for tasks assessed   Lower Extremity Assessment Lower Extremity Assessment: Defer to PT evaluation   Cervical /  Trunk Assessment Cervical / Trunk Assessment: Other exceptions Cervical / Trunk Exceptions: morbidly obese   Communication Communication Communication: No apparent difficulties   Cognition Arousal: Alert Behavior During Therapy: WFL for tasks assessed/performed Overall Cognitive  Status: No family/caregiver present to determine baseline cognitive functioning                                 General Comments: pt able to follow commands, A&Ox4, decreased insight to health and ability to care for self medically. was able to report PLOF [name] and describe what usually makes him dyspnic.     General Comments  dyspnea 2/4 with LB ADl with pt  self implementing self pacing. Pt reports LB ADL not usually this difficulty for him and SpO2 as low as 85%. RN aware anbd reporting MD plans to order home O2. Reviewed EC techniques    Exercises     Shoulder Instructions      Home Living Family/patient expects to be discharged to:: Private residence Living Arrangements: Alone Available Help at Discharge: Family;Friend(s);Personal care attendant;Available PRN/intermittently [facility] aide comes out 3x/wk for 2hrs a day. pt reports friends are able to check on him as well on request) Type of Home: Apartment Home Access: Level entry     Home Layout: One level     Bathroom Shower/Tub: Chief Strategy Officer: Standard     Home Equipment: Agricultural consultant (2 wheels);Cane - single point;Grab bars - tub/shower;Wheelchair - power (hover round)   Additional Comments: pt reports home health aide doing his cooking, cleaning, and grocery shopping      Prior Functioning/Environment Prior Level of Function : Needs assist             Mobility Comments: pt reports using hover-round majority of time and SPC and occasionally the RW for short distance amb ADLs Comments: pt reports doing dressing/bathing indep, can do simple meal prep        OT Problem List: Decreased strength;Impaired balance (sitting and/or standing);Decreased activity tolerance;Decreased safety awareness;Decreased knowledge of use of DME or AE;Cardiopulmonary status limiting activity      OT Treatment/Interventions: Self-care/ADL training;Therapeutic exercise;Energy conservation;DME and/or AE  instruction;Balance training;Patient/family education;Therapeutic activities;Cognitive remediation/compensation    OT Goals(Current goals can be found in the care plan section) Acute Rehab OT Goals Patient Stated Goal: go home OT Goal Formulation: With patient Time For Goal Achievement: 08/02/23 Potential to Achieve Goals: Good  OT Frequency: Min 1X/week    Co-evaluation              AM-PAC OT "6 Clicks" Daily Activity     Outcome Measure Help from another person eating meals?: None Help from another person taking care of personal grooming?: A Little Help from another person toileting, which includes using toliet, bedpan, or urinal?: A Little Help from another person bathing (including washing, rinsing, drying)?: A Little Help from another person to put on and taking off regular upper body clothing?: A Little Help from another person to put on and taking off regular lower body clothing?: A Little 6 Click Score: 19   End of Session Nurse Communication: Mobility status  Activity Tolerance: Patient tolerated treatment well Patient left: in chair;with call bell/phone within reach  OT Visit Diagnosis: Unsteadiness on feet (R26.81);Muscle weakness (generalized) (M62.81)                Time: 2841-3244 OT Time Calculation (min): 22 min Charges:  OT General  Charges $OT Visit: 1 Visit OT Evaluation $OT Eval Moderate Complexity: 1 Mod  Tyler Deis, OTR/L [facility] Acute Rehabilitation Office: [phone]  Myrla Halsted 07/19/2023, 2:12 PM

## 2023-07-19 NOTE — Progress Notes (Addendum)
Room Air Oxygen at rest sats 88%, ambulating in room 85% 2L Oxygen at rest 91%, ambulating in room 88% 4L Oxygen at rest 94%, ambulating in room 92%  VASCULAR STAFF ADDENDUM: I agree with the above.   Rande Brunt. Lenell Antu, MD [facility] Phone Number: [phone] 07/19/2023 4:56 PM

## 2023-07-19 NOTE — Care Management (Cosign Needed)
    Durable Medical Equipment  (From admission, onward)           Start     Ordered   07/19/23 1513  For home use only DME oxygen  Once       Question Answer Comment  Length of Need 6 Months   Mode or (Route) Nasal cannula   Liters per Minute 6   Frequency Continuous (stationary and portable oxygen unit needed)   Oxygen delivery system Gas      07/19/23 1513

## 2023-07-19 NOTE — Progress Notes (Signed)
Discharged to home via taxi with 4 oxygen tanks. Discharged instructions given, questions answered.

## 2023-07-19 NOTE — Evaluation (Signed)
Physical Therapy Evaluation Patient Details Name: Reginald Rodriguez MRN: [id number] DOB: May 13, 1951 Today's Date: 07/19/2023  History of Present Illness  Reginald Rodriguez is a 72 y.o. male who presented to ED with R abd pain. 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 PMH: obesity, diabetes, CVA   Clinical Impression  Pt admitted with above. PTA pt lives alone with home health aide that comes 3x/wk for 2hrs at a time to assist with grocery shopping, transport to MD appts, cooking, and cleaning.  Pt reports using a hover-round primarily for mobility both inside and outside due to SOB. Pt able to amb 200' with eva walker however SpO2 at 85% on RA with noted 3/4 DOE. Pt did attempt to amb with SPC however pt with significantly less distance, more unsteady, and 4/4 DOE. Pt encouraged to get a long handled shoe horn and reacher to aide in lower body dressing and encouraged pt to start using his RW at home and to start walking in the home for exercise. Recommend HHPT upon d/c to optimize home set up and progress ambulation. Recommend OT eval to address Lower body ADL difficulty. Acute PT to cont to follow.      If plan is discharge home, recommend the following: A little help with walking and/or transfers;A little help with bathing/dressing/bathroom;Assist for transportation   Can travel by private vehicle        Equipment Recommendations    Recommendations for Other Services  OT consult    Functional Status Assessment Patient has had a recent decline in their functional status and demonstrates the ability to make significant improvements in function in a reasonable and predictable amount of time.     Precautions / Restrictions Restrictions Weight Bearing Restrictions Per Provider Order: No      Mobility  Bed Mobility               General bed mobility comments: pt received up in chair    Transfers Overall transfer level: Modified independent Equipment used:  None               General transfer comment: pt with wide base of support, rocking momentum, incresaed time, labored effort/breathing    Ambulation/Gait Ambulation/Gait assistance: Contact guard assist Gait Distance (Feet): 200 Feet Assistive device: Fara Boros Gait Pattern/deviations: Step-through pattern, Decreased stride length, Trunk flexed Gait velocity: dec Gait velocity interpretation: 1.31 - 2.62 ft/sec, indicative of limited community ambulator   General Gait Details: per RN pt amb with  SPC with her earlier and he could only amb about 29' and SPO2 was in to 80s on RA and pt with 3/4 DOE. Pt amb with eva walker this date and was able to tolerate increased distances of 200'. verbal cues to stand upright and now lean on RW. SpO2 at 85% on RA, noted 3/4 DOE  Acupuncturist Bed    Modified Rankin (Stroke Patients Only)       Balance Overall balance assessment: Needs assistance Sitting-balance support: Feet supported, No upper extremity supported Sitting balance-Leahy Scale: Good     Standing balance support: Bilateral upper extremity supported, During functional activity Standing balance-Leahy Scale: Fair                               Pertinent Vitals/Pain Pain Assessment Pain Assessment: Faces Faces Pain Scale:  Hurts even more Pain Location: abdomen when coughing Pain Descriptors / Indicators: Constant Pain Intervention(s): Monitored during session    Home Living Family/patient expects to be discharged to:: Private residence Living Arrangements: Alone Available Help at Discharge: Family;Friend(s);Personal care attendant;Available PRN/intermittently [facility] aide comes out 3x/wk for 2hrs a day) Type of Home: Apartment Home Access: Level entry       Home Layout: One level Home Equipment: Agricultural consultant (2 wheels);Cane - single point;Grab bars - tub/shower;Wheelchair - power Additional Comments: pt reports  home health aide doing his cooking, cleaning, and grocery shopping    Prior Function Prior Level of Function : Needs assist             Mobility Comments: pt reports using hover-round majority of time and SPC and occasionally the RW for short distance amb ADLs Comments: pt reports doing dressing/bathing indep, can do simple meal prep     Extremity/Trunk Assessment   Upper Extremity Assessment Upper Extremity Assessment: Overall WFL for tasks assessed    Lower Extremity Assessment Lower Extremity Assessment: Overall WFL for tasks assessed    Cervical / Trunk Assessment Cervical / Trunk Assessment: Other exceptions Cervical / Trunk Exceptions: morbidly obese  Communication   Communication Communication: No apparent difficulties  Cognition Arousal: Alert Behavior During Therapy: WFL for tasks assessed/performed Overall Cognitive Status: No family/caregiver present to determine baseline cognitive functioning                                 General Comments: pt able to follow commands, A&Ox4, decreased insight to health and ability to care for self medically        General Comments General comments (skin integrity, edema, etc.): pt with morbid obesity making Lower body ADLs difficult and labored, +SOB, SpO2 at 85% on RA during activity    Exercises     Assessment/Plan    PT Assessment Patient needs continued PT services  PT Problem List Decreased activity tolerance;Decreased balance;Decreased mobility;Obesity       PT Treatment Interventions DME instruction;Gait training;Functional mobility training;Therapeutic activities;Therapeutic exercise;Balance training;Neuromuscular re-education    PT Goals (Current goals can be found in the Care Plan section)  Acute Rehab PT Goals Patient Stated Goal: home PT Goal Formulation: With patient Time For Goal Achievement: 08/02/23 Potential to Achieve Goals: Good    Frequency Min 1X/week     Co-evaluation                AM-PAC PT "6 Clicks" Mobility  Outcome Measure Help needed turning from your back to your side while in a flat bed without using bedrails?: A Little Help needed moving from lying on your back to sitting on the side of a flat bed without using bedrails?: A Little Help needed moving to and from a bed to a chair (including a wheelchair)?: A Little Help needed standing up from a chair using your arms (e.g., wheelchair or bedside chair)?: A Little Help needed to walk in hospital room?: A Little Help needed climbing 3-5 steps with a railing? : A Lot 6 Click Score: 17    End of Session   Activity Tolerance: Patient limited by fatigue Patient left: in chair;with call bell/phone within reach Nurse Communication: Mobility status PT Visit Diagnosis: Unsteadiness on feet (R26.81);Muscle weakness (generalized) (M62.81)    Time: 1610-9604 PT Time Calculation (min) (ACUTE ONLY): 35 min   Charges:   PT Evaluation $PT Eval Low Complexity: 1 Low  PT Treatments $Gait Training: 8-22 mins PT General Charges $$ ACUTE PT VISIT: 1 Visit         Lewis Shock, PT, DPT Acute Rehabilitation Services Secure chat preferred Office #: [phone]   Iona Hansen 07/19/2023, 12:31 PM

## 2023-07-19 NOTE — TOC Transition Note (Addendum)
Transition of Care [facility]) - Discharge Note   Patient Details  Name: Reginald Rodriguez MRN: [id number] Date of Birth: [birth year]  Transition of Care [facility] CM/SW Contact:  Ronny Bacon, RN Phone Number: 07/19/2023, 2:33 PM   Clinical Narrative:    Patient is being discharged today. HH PT/OT arranged through Cindie with [facility]. Home oxygen arranged through [facility] with [employer]. Patient unable to afford cab home, has only $5 with him, cab voucher given to unit to assist with transportation home.  1515: Call from [facility] with [employer], patient needs specific ambulation sats documented before delivery of oxygen tank. Floor nurse made aware. Also, needed oxygen order to specify if oxygen is continuous or not. Provider aware, order updated.   Final next level of care: Home w Home Health Services Barriers to Discharge: No Barriers Identified   Patient Goals and CMS Choice            Discharge Placement                       Discharge Plan and Services Additional resources added to the After Visit Summary for                  DME Arranged: Oxygen DME Agency: [employer] Date DME Agency Contacted: 07/19/23 Time DME Agency Contacted: 1424 Representative spoke with at DME Agency: Mitch HH Arranged: PT, OT HH Agency: [facility] Date [facility] Agency Contacted: 07/19/23 Time HH Agency Contacted: 1405 Representative spoke with at [facility] Agency: Cindie  Social Drivers of Health (SDOH) Interventions SDOH Screenings   Food Insecurity: No Food Insecurity (07/18/2023)  Housing: Low Risk  (07/18/2023)  Transportation Needs: No Transportation Needs (07/18/2023)  Utilities: Not At Risk (07/18/2023)  Depression (PHQ2-9): Low Risk  (09/17/2022)  Financial Resource Strain: Low Risk  (09/17/2022)  Physical Activity: Inactive (09/17/2022)  Social Connections: Unknown (07/18/2023)  Stress: No Stress Concern Present (09/17/2022)  Tobacco Use: High Risk (07/18/2023)     Readmission Risk  Interventions     No data to display

## 2023-07-19 NOTE — Progress Notes (Addendum)
[facility]  Subjective  - doing well. No complaints.   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   B LE well perfused Voided, will ambulate prior to going home, tolerating PO's without N/V Plan for discharge home today in stable and improved. F/U in 4 weeks with Dr. Lenell Antu with CTA Abd/pelvis.  Mosetta Pigeon 07/19/2023 7:28 AM --  Laboratory Lab Results: Recent Labs    07/18/23 2320 07/19/23 0253  WBC 9.8 9.2  HGB 13.6 13.7  HCT 41.0 40.9  PLT 250 270   BMET Recent Labs    07/18/23 2320 07/19/23 0253  NA 137 137  K 3.4* 3.6  CL 104 101  CO2 26 24  GLUCOSE 122* 161*  BUN 25* 26*  CREATININE 1.85* 1.89*  CALCIUM 9.2 9.3    COAG Lab Results  Component Value Date   INR 1.1 07/18/2023   No results found for: "PTT"   VASCULAR STAFF ADDENDUM: I have independently interviewed and examined the patient. I agree with the above.   Rande Brunt. Lenell Antu, MD [facility] Phone Number: [phone] 07/19/2023 10:44 AM

## 2023-07-20 ENCOUNTER — Telehealth: Payer: Self-pay

## 2023-07-20 NOTE — Transitions of Care (Post Inpatient/ED Visit) (Signed)
 07/20/2023  Name: Reginald Rodriguez MRN: [id number] DOB: 1951/05/24  Today's TOC FU Call Status: Today's TOC FU Call Status:: Successful TOC FU Call Completed TOC FU Call Complete Date: 07/20/23 Patient's Name and Date of Birth confirmed.  Transition Care Management Follow-up Telephone Call Date of Discharge: 07/19/23 Discharge Facility: [facility]) Type of Discharge: Inpatient Admission Primary Inpatient Discharge Diagnosis:: Abdominal Aortic Aneurysm unspecified whether ruptured, underwent surgical repair of AAA on 07/18/23 How have you been since you were released from the hospital?: Same Any questions or concerns?: Yes Patient Questions/Concerns:: Patient is very concerned he was discharged without having a bowel movement but states he is just grateful to be alive because of the surgery he underwent to repair his abdominal aortic aneurysm. Patient states he hasn't had a bowel movement for 7 days , is out of his lactulose  medication and does not have transportation, but he stated he does have someone who is going out to pick up laxative/stool softener for him. Stated I would follow up with patient after the holiday (made appt for 07/22/23 at 3pm for FU) and that it was important to seek medical care if he did not have a bowel movement by tomorrow after taking the laxatives his friend is picking up for him. Explained the importance of not straining to have BM postop and explained that if he does not have a bowel movement after the laxatives he might have a bowel obstruction and counseled patient to seek Medical treatment promptly for this, even if it meant going back to the hospital ED. 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 Sig Documenting Provider Last Dose Status Informant  Accu-Chek Softclix Lancets lancets [id number]  Use to check blood sugar 3 times daily. Brien Belvie BRAVO, MD  Active Self, Pharmacy Records  amLODipine  (NORVASC ) 5 MG tablet [id number] Yes TAKE 1 TABLET EVERY DAY Brien Belvie BRAVO, MD Taking Active Self, Pharmacy Records  ammonium lactate  (AMLACTIN) 12 % cream [id number]  Apply 1 Application topically as needed for dry skin. Silva Juliene SAUNDERS, DPM  Active Self, Pharmacy Records  aspirin  EC 81 MG tablet [id number]  Take 1 tablet (81 mg total) by mouth daily. Brien Belvie BRAVO, MD  Active Self, Pharmacy Records  atorvastatin  (LIPITOR) 10 MG tablet [id number]  Take 1 tablet (10 mg total) by mouth daily. Brien Belvie BRAVO, MD  Active Self, Pharmacy Records           Med Note JACKOLYN WADDELL VEAR Pablo Jul 19, 2023  7:45 AM) Dispense record does not support patient claim.   chlorthalidone  (HYGROTON )  25 MG tablet [id number] Yes TAKE 1 TABLET EVERY DAY Fleming, Zelda W, NP Taking Active Self, Pharmacy Records  dapagliflozin  propanediol (FARXIGA ) 10 MG TABS tablet [id number] Yes Take 1 tablet (10 mg total) by mouth daily before breakfast. Shamleffer, Donell Cardinal, MD Taking  Active Self, Pharmacy Records  fenofibrate  micronized (LOFIBRA) 134 MG capsule [id number]  Take 1 capsule (134 mg total) by mouth daily before breakfast. Brien Belvie BRAVO, MD  Active Self, Pharmacy Records  gabapentin  (NEURONTIN ) 100 MG capsule [id number] Yes Take 1 capsule (100 mg total) by mouth 2 (two) times daily. Please mail Delbert Clam, MD Taking Active Self, Pharmacy Records  glucose chewable tablet 4 g [id number]   Brien Belvie BRAVO, MD  Active   ibuprofen (ADVIL) 200 MG tablet [id number]  Take 400 mg by mouth every 6 (six) hours as needed for moderate pain (pain score 4-6). [provider]  Active Self, Pharmacy Records  insulin  aspart (NOVOLOG  FLEXPEN) 100 UNIT/ML FlexPen [id number]  Max daily 75units  Patient taking differently: Inject 20-30 Units into the skin in the morning. Inject 30 units into the skin every morning and 20 units throughout the day as needed for high blood sugar.   Shamleffer, Donell Cardinal, MD  Active Self, Pharmacy Records           Med Note JACKOLYN WADDELL VEAR Pablo Jul 19, 2023  7:46 AM) Confirmed directions with patient.   insulin  degludec (TRESIBA  FLEXTOUCH) 200 UNIT/ML FlexTouch Pen [id number] Yes Inject 64 Units into the skin daily in the afternoon.  Patient taking differently: Inject 60 Units into the skin at bedtime.   Shamleffer, Donell Cardinal, MD Taking Active Self, Pharmacy Records  lactulose , encephalopathy, (CHRONULAC ) 10 GM/15ML SOLN [id number] No Take 15 mLs (10 g total) by mouth daily as needed (constipation).  Patient not taking: Reported on 07/20/2023   Brien Belvie BRAVO, MD Not Taking Active Self, Pharmacy Records           Med Note [facility]   Tue Jul 20, 2023  6:05 PM) Needs/wants refill. 07/20/23 pm - states he has not had bowel movement x 7 days. Advised use of OTC laxative/stool softener which friend is going out to get.  lisinopril  (ZESTRIL ) 2.5 MG tablet [id number]  Take 1 tablet (2.5 mg total) by mouth daily. Brien Belvie BRAVO, MD  Active Self, Pharmacy Records           Med Note JACKOLYN WADDELL VEAR Pablo Jul 19, 2023  7:47 AM) Dispense record does not support compliance.   methocarbamol  (ROBAXIN ) 750 MG tablet [id number]  Take 1 tablet (750 mg total) by mouth every 8 (eight) hours as needed for muscle spasms. Theotis Haze ORN, NP  Active Self, Pharmacy Records  oxyCODONE  (OXY IR/ROXICODONE ) 5 MG immediate release tablet [id number]  Take 1 tablet (5 mg total) by mouth every 6 (six) hours as needed for moderate pain (pain score 4-6) or severe pain (pain score 7-10). Gerome Herring M, PA-C  Active   pioglitazone  (ACTOS ) 30 MG tablet [id number] Yes Take 1 tablet (30 mg total) by mouth daily. Shamleffer, Donell Cardinal, MD Taking Active Self, Pharmacy Records            Home Care and Equipment/Supplies: Were Home Health Services Ordered?: Yes Name of Home Health Agency:: [facility] Has Agency set up a time to come to your home?: No EMR reviewed for Home Health Orders: Orders present/patient has not received call (refer to CM for follow-up) (DCSummary  notes order for [facility] and that the [facility] office will call to arrange follow up (within 48 hrs) after hospital discharge) Any new equipment or medical supplies ordered?: No  Functional Questionnaire: Do you need assistance with bathing/showering or dressing?: Yes (completes ADLs by self very slowly) Do you need assistance with meal preparation?: No Do you need assistance with eating?: No Do you have difficulty maintaining continence: No Do you need assistance with getting out of bed/getting out of a chair/moving?: Yes (completes ADLs by self very slowly) Do you have difficulty managing or taking your medications?: No  Follow up appointments reviewed: PCP Follow-up appointment confirmed?: (S) No (Encouraged patient to call as  soon as possible to arrange hospital FU with NP Haze Servant - office currently closed but encouraged patient to call early Thursday  07/22/23 morning after the holiday. Also told patient I would follow up w/ him via tele 1/2) MD Provider Line Number:[phone] Given: No Specialist Hospital Follow-up appointment confirmed?: Yes Date of Specialist follow-up appointment?: 08/24/23 Follow-Up Specialty Provider:: Post Op with Dr. Debby Distad, needs CTA prior to appt Do you need transportation to your follow-up appointment?: No (states he probably will be able to get ride but we will discuss further on 07/22/23 telephone FU to see if patient can get set up with transportation via his insurance plan) Do you understand care options if your condition(s) worsen?: Yes-patient verbalized understanding (Reviewed care options if condition worsens at length - patient knows to seek treatment if pain worsens, unable to have BM after taking laxative/stool softener)  Follow up telephone appointment scheduled for 07/22/23 @ 3pm to check on whether Home health Services have contacted patient, need for PCP hospital FU appointment as soon as possible, need to request refill of several meds ordered including but not limited to lactulose , check on status of bowel movements.  Channing Larry, RN, BA, [facility], CRRN [facility] Coordinator, Transition of Care Ph # [phone]

## 2023-07-22 ENCOUNTER — Other Ambulatory Visit: Payer: Self-pay

## 2023-07-22 ENCOUNTER — Telehealth: Payer: Self-pay

## 2023-07-22 DIAGNOSIS — E119 Type 2 diabetes mellitus without complications: Secondary | ICD-10-CM | POA: Diagnosis not present

## 2023-07-22 DIAGNOSIS — Z7982 Long term (current) use of aspirin: Secondary | ICD-10-CM | POA: Diagnosis not present

## 2023-07-22 DIAGNOSIS — Z8679 Personal history of other diseases of the circulatory system: Secondary | ICD-10-CM | POA: Diagnosis not present

## 2023-07-22 DIAGNOSIS — I10 Essential (primary) hypertension: Secondary | ICD-10-CM | POA: Diagnosis not present

## 2023-07-22 DIAGNOSIS — Z8673 Personal history of transient ischemic attack (TIA), and cerebral infarction without residual deficits: Secondary | ICD-10-CM | POA: Diagnosis not present

## 2023-07-22 DIAGNOSIS — Z794 Long term (current) use of insulin: Secondary | ICD-10-CM | POA: Diagnosis not present

## 2023-07-22 DIAGNOSIS — Z95818 Presence of other cardiac implants and grafts: Secondary | ICD-10-CM | POA: Diagnosis not present

## 2023-07-22 DIAGNOSIS — F17210 Nicotine dependence, cigarettes, uncomplicated: Secondary | ICD-10-CM | POA: Diagnosis not present

## 2023-07-22 DIAGNOSIS — Z48812 Encounter for surgical aftercare following surgery on the circulatory system: Secondary | ICD-10-CM | POA: Diagnosis not present

## 2023-07-22 LAB — BPAM RBC
Blood Product Expiration Date: 202501232359
Blood Product Expiration Date: 202501232359
Blood Product Expiration Date: 202501262359
Blood Product Expiration Date: 202501262359
ISSUE DATE / TIME: 202412302107
ISSUE DATE / TIME: 202412302130
Unit Type and Rh: 5100
Unit Type and Rh: 5100
Unit Type and Rh: 5100
Unit Type and Rh: 5100

## 2023-07-22 LAB — TYPE AND SCREEN
ABO/RH(D): O POS
Antibody Screen: NEGATIVE
Unit division: 0
Unit division: 0
Unit division: 0
Unit division: 0

## 2023-07-22 NOTE — Patient Instructions (Signed)
 Visit Information  Thank you for taking time to visit with me today. Please don't hesitate to contact me if I can be of assistance to you before our next scheduled telephone appointment.  Our next appointment is by telephone on 07/29/23 at 1pm  Following is a copy of your care plan:   Goals Addressed             This Visit's Progress    Transition of Care       Current Barriers:  Knowledge Deficits related to plan of care for management of Post-operative cardiac care following surgical repair of Abdominal Aortic Aneurysm rupture on 07/18/23 by Dr. Magda  RNCM Clinical Goal(s):  Patient will work with the Care Management team over the next 30 days to address Transition of Care Barriers: Medication access Medication Management Diet/Nutrition/Food Resources Support at home Provider appointments Functional/Safety Transportation through collaboration with Medical Illustrator, provider, and care team.   Interventions: Evaluation of current treatment plan related to  self management and patient's adherence to plan as established by provider   Surgery (07/18/23 Patient underwent aorto-bi-iliac stent graft repair of AAA (Abdominal Aortic Aneurysm and extension of repair onto iliac arteries x 2):  (Status: New goal.) Short Term Goal Evaluation of current treatment plan related to Stent graft repair of Abdominal Aortic Aneurysm surgery assessed patient/caregiver understanding of surgical procedure   reviewed post-operative instructions with patient/caregiver addressed questions about post - surgical incision care  reviewed medications with patient and addressed questions reviewed scheduled provider appointments with patientpatient has 4wk postop follow up with Surgeon Debby Magda on 08/24/23 confirmed availability of transportation to all appointments 07/22/23 - patient confirmed he has adequate transportation to complete his appointments  Patient Goals/Self-Care Activities: Participate in  Transition of Care Program/Attend TOC scheduled calls Attend all scheduled provider appointments Call pharmacy for medication refills 3-7 days in advance of running out of medications Perform all self care activities independently  Perform IADL's (shopping, preparing meals, housekeeping, managing finances) independently Call provider office for new concerns or questions   Follow Up Plan:  The patient has been provided with contact information for the care management team and has been advised to call with any health related questions or concerns.  The patient will call his PCP, Haze Servant, NP as advised to schedule a hospitalization follow up. Patient committed to calling his PCP tomorrow, 07/23/23 in the morning.          Patient verbalizes understanding of instructions and care plan provided today and agrees to view in MyChart. Active MyChart status and patient understanding of how to access instructions and care plan via MyChart confirmed with patient.     The patient has been provided with contact information for the care management team and has been advised to call with any health related questions or concerns.   Please call the care guide team at [phone] if you need to cancel or reschedule your appointment.   Please call 1-800-273-TALK (toll free, 24 hour hotline) if you are experiencing a Mental Health or Behavioral Health Crisis or need someone to talk to.  Channing Larry, RN, BA, [facility], CRRN [facility] Coordinator, Transition of Care Ph # [phone]

## 2023-07-22 NOTE — Patient Outreach (Signed)
  Care Management  Transitions of Care Program Transitions of Care Post-discharge week 2   07/22/2023 Name: Reginald Rodriguez MRN: [id number] DOB: 05/09/1951  Subjective: Reginald Rodriguez is a 73 y.o. year old male who is a primary care patient of Theotis Haze ORN, NP. The Care Management team Engaged with patient Engaged with patient by telephone to assess and address transitions of care needs.   Consent to Services:  Patient was given information about care management services, agreed to services, and gave verbal consent to participate.   Assessment:           SDOH Interventions    Flowsheet Row Clinical Support from 09/17/2022 in [facility] Of [place]. [facility]  SDOH Interventions   Food Insecurity Interventions Intervention Not Indicated  Transportation Interventions Intervention Not Indicated  Financial Strain Interventions Intervention Not Indicated  Physical Activity Interventions Patient Refused, Other (Comments)  Stress Interventions Intervention Not Indicated        Goals Addressed             This Visit's Progress    Transition of Care       Current Barriers:  Knowledge Deficits related to plan of care for management of Post-operative cardiac care following surgical repair of Abdominal Aortic Aneurysm rupture on 07/18/23 by Dr. Magda  RNCM Clinical Goal(s):  Patient will work with the Care Management team over the next 30 days to address Transition of Care Barriers: Medication access Medication Management Diet/Nutrition/Food Resources Support at home Provider appointments Functional/Safety Transportation through collaboration with Medical Illustrator, provider, and care team.   Interventions: Evaluation of current treatment plan related to  self management and patient's adherence to plan as established by provider   Surgery (07/18/23 Patient underwent aorto-bi-iliac stent graft repair of AAA (Abdominal Aortic Aneurysm  and extension of repair onto iliac arteries x 2):  (Status: New goal.) Short Term Goal Evaluation of current treatment plan related to Stent graft repair of Abdominal Aortic Aneurysm surgery assessed patient/caregiver understanding of surgical procedure   reviewed post-operative instructions with patient/caregiver addressed questions about post - surgical incision care  reviewed medications with patient and addressed questions reviewed scheduled provider appointments with patientpatient has 4wk postop follow up with Surgeon Debby Magda on 08/24/23 confirmed availability of transportation to all appointments 07/22/23 - patient confirmed he has adequate transportation to complete his appointments  Patient Goals/Self-Care Activities: Participate in Transition of Care Program/Attend TOC scheduled calls Attend all scheduled provider appointments Call pharmacy for medication refills 3-7 days in advance of running out of medications Perform all self care activities independently  Perform IADL's (shopping, preparing meals, housekeeping, managing finances) independently Call provider office for new concerns or questions   Follow Up Plan:  The patient has been provided with contact information for the care management team and has been advised to call with any health related questions or concerns.  The patient will call his PCP, Haze Theotis, NP as advised to schedule a hospitalization follow up. Patient committed to calling his PCP tomorrow, 07/23/23 in the morning.          Plan: The patient has been provided with contact information for the care management team and has been advised to call with any health related questions or concerns.   Channing Larry, RN, BA, [facility], CRRN [facility] Coordinator, Transition of Care Ph # [phone]

## 2023-07-27 DIAGNOSIS — Z7982 Long term (current) use of aspirin: Secondary | ICD-10-CM | POA: Diagnosis not present

## 2023-07-27 DIAGNOSIS — Z8673 Personal history of transient ischemic attack (TIA), and cerebral infarction without residual deficits: Secondary | ICD-10-CM | POA: Diagnosis not present

## 2023-07-27 DIAGNOSIS — Z794 Long term (current) use of insulin: Secondary | ICD-10-CM | POA: Diagnosis not present

## 2023-07-27 DIAGNOSIS — Z48812 Encounter for surgical aftercare following surgery on the circulatory system: Secondary | ICD-10-CM | POA: Diagnosis not present

## 2023-07-27 DIAGNOSIS — I10 Essential (primary) hypertension: Secondary | ICD-10-CM | POA: Diagnosis not present

## 2023-07-27 DIAGNOSIS — Z95818 Presence of other cardiac implants and grafts: Secondary | ICD-10-CM | POA: Diagnosis not present

## 2023-07-27 DIAGNOSIS — F17210 Nicotine dependence, cigarettes, uncomplicated: Secondary | ICD-10-CM | POA: Diagnosis not present

## 2023-07-27 DIAGNOSIS — E119 Type 2 diabetes mellitus without complications: Secondary | ICD-10-CM | POA: Diagnosis not present

## 2023-07-27 DIAGNOSIS — Z8679 Personal history of other diseases of the circulatory system: Secondary | ICD-10-CM | POA: Diagnosis not present

## 2023-07-27 NOTE — Telephone Encounter (Signed)
No additional documentation needed.

## 2023-07-28 ENCOUNTER — Other Ambulatory Visit: Payer: Self-pay

## 2023-07-28 DIAGNOSIS — Z8679 Personal history of other diseases of the circulatory system: Secondary | ICD-10-CM

## 2023-07-29 ENCOUNTER — Other Ambulatory Visit: Payer: Self-pay

## 2023-07-29 ENCOUNTER — Telehealth: Payer: Self-pay

## 2023-07-29 DIAGNOSIS — Z95818 Presence of other cardiac implants and grafts: Secondary | ICD-10-CM | POA: Diagnosis not present

## 2023-07-29 DIAGNOSIS — I10 Essential (primary) hypertension: Secondary | ICD-10-CM | POA: Diagnosis not present

## 2023-07-29 DIAGNOSIS — Z8679 Personal history of other diseases of the circulatory system: Secondary | ICD-10-CM | POA: Diagnosis not present

## 2023-07-29 DIAGNOSIS — Z794 Long term (current) use of insulin: Secondary | ICD-10-CM | POA: Diagnosis not present

## 2023-07-29 DIAGNOSIS — E119 Type 2 diabetes mellitus without complications: Secondary | ICD-10-CM | POA: Diagnosis not present

## 2023-07-29 DIAGNOSIS — Z7982 Long term (current) use of aspirin: Secondary | ICD-10-CM | POA: Diagnosis not present

## 2023-07-29 DIAGNOSIS — Z8673 Personal history of transient ischemic attack (TIA), and cerebral infarction without residual deficits: Secondary | ICD-10-CM | POA: Diagnosis not present

## 2023-07-29 DIAGNOSIS — Z48812 Encounter for surgical aftercare following surgery on the circulatory system: Secondary | ICD-10-CM | POA: Diagnosis not present

## 2023-07-29 DIAGNOSIS — F17210 Nicotine dependence, cigarettes, uncomplicated: Secondary | ICD-10-CM | POA: Diagnosis not present

## 2023-07-29 NOTE — Patient Outreach (Signed)
 Care Management  Transitions of Care Program Transitions of Care Post-discharge week 2   07/29/2023 Name: Reginald Rodriguez MRN: [id number] DOB: 17-Jul-1951  Subjective: Reginald Rodriguez is a 73 y.o. year old male who is a primary care patient of Reginald Rodriguez ORN, NP. The Care Management team Engaged with patient Engaged with patient by telephone to assess and address transitions of care needs.   Consent to Services:  Patient was given information about care management services, agreed to services, and gave verbal consent to participate.   Assessment:           SDOH Interventions    Flowsheet Row Clinical Support from 09/17/2022 in [facility] Dept Of [place]. [facility]  SDOH Interventions   Food Insecurity Interventions Intervention Not Indicated  Transportation Interventions Intervention Not Indicated  Financial Strain Interventions Intervention Not Indicated  Physical Activity Interventions Patient Refused, Other (Comments)  Stress Interventions Intervention Not Indicated        Goals Addressed             This Visit's Progress    Transition of Care       Current Barriers:  Knowledge Deficits related to plan of care for management of Post-operative cardiac care following surgical repair of Abdominal Aortic Aneurysm rupture on 07/18/23 by Dr. Magda  RNCM Clinical Goal(s):  Patient will work with the Care Management team over the next 30 days to address Transition of Care Barriers: Medication access Medication Management Diet/Nutrition/Food Resources Support at home Provider appointments Functional/Safety Transportation through collaboration with Medical Illustrator, provider, and care team.   Interventions: Evaluation of current treatment plan related to  self management and patient's adherence to plan as established by provider   Surgery (07/18/23 Patient underwent aorto-bi-iliac stent graft repair of AAA (Abdominal Aortic Aneurysm  and extension of repair onto iliac arteries x 2):  (Status: New goal.) Short Term Goal Evaluation of current treatment plan related to Stent graft repair of Abdominal Aortic Aneurysm surgery assessed patient/caregiver understanding of surgical procedure   reviewed post-operative instructions with patient/caregiver addressed questions about post - surgical incision care  reviewed medications with patient and addressed questions reviewed scheduled provider appointments with patientpatient has 4wk postop follow up with Surgeon Debby Magda on 08/24/23 confirmed availability of transportation to all appointments 07/22/23 - patient confirmed he has adequate transportation to complete his appointments  Patient Goals/Self-Care Activities: Participate in Transition of Care Program/Attend TOC scheduled calls Attend all scheduled provider appointments Call pharmacy for medication refills 3-7 days in advance of running out of medications Perform all self care activities independently  Perform IADL's (shopping, preparing meals, housekeeping, managing finances) independently Call provider office for new concerns or questions   Follow Up Plan:  The patient has been provided with contact information for the care management team and has been advised to call with any health related questions or concerns.  The patient will call his PCP, Rodriguez Theotis, NP as advised to schedule a hospitalization follow up. Patient committed to calling his PCP tomorrow, 07/23/23 in the morning.  07/29/23 Update : Spoke to patient who stated he called his PCP last week as advised to schedule appt. however, according to patient, he was told that someone would call him back - still had not heard back from PCP when I called today. Contacted PCP's office on behalf of patient and was promised that someone from the office would call patient by end of day to schedule patient's hospital FU.  As of 530pm, patient had not heard back from PCP office.  Will call PCP's office again in AM to check on status of needed hospital follow up and call patient again tomorrow to continue TOC 30d assessment/follow up.         Plan: The patient has been provided with contact information for the care management team and has been advised to call with any health related questions or concerns.   Channing Larry, RN, BA, [facility], CRRN [facility] Coordinator, Transition of Care Ph # [phone]

## 2023-07-30 ENCOUNTER — Other Ambulatory Visit: Payer: Self-pay

## 2023-07-30 ENCOUNTER — Telehealth: Payer: Self-pay

## 2023-07-30 NOTE — Patient Instructions (Signed)
 Visit Information  Thank you for taking time to visit with me today. Please don't hesitate to contact me if I can be of assistance to you before our next scheduled telephone appointment.  Our next appointment is by telephone on 08/05/23 at 1pm.  Following is a copy of your care plan:   Goals Addressed             This Visit's Progress    Transition of Care       Current Barriers:  Knowledge Deficits related to plan of care for management of Post-operative cardiac care following surgical repair of Abdominal Aortic Aneurysm rupture on 07/18/23 by Dr. Magda  RNCM Clinical Goal(s):  Patient will work with the Care Management team over the next 30 days to address Transition of Care Barriers: Medication access Medication Management Diet/Nutrition/Food Resources Support at home Provider appointments Functional/Safety Transportation through collaboration with Medical Illustrator, provider, and care team.   Interventions: Evaluation of current treatment plan related to  self management and patient's adherence to plan as established by provider   Surgery (07/18/23 Patient underwent aorto-bi-iliac stent graft repair of AAA (Abdominal Aortic Aneurysm and extension of repair onto iliac arteries x 2):  (Status: Ongoing) Short Term Goal Evaluation of current treatment plan related to Stent graft repair of Abdominal Aortic Aneurysm surgery assessed patient/caregiver understanding of surgical procedure   reviewed post-operative instructions with patient/caregiver addressed questions about post - surgical incision care  reviewed medications with patient and addressed questions reviewed scheduled provider appointments with patientpatient has 4wk postop follow up with Surgeon Debby Magda on 08/24/23 confirmed availability of transportation to all appointments 07/22/23 - patient confirmed he has adequate transportation to complete his appointments 07/29/22 obtained Hospital FU appointment with Dr. Juanice  of [facility] for 08/03/23 @ 2:20pm which is an affiliated PCP/practice of patient's PCP Haze Servant. Scheduled appt is exactly 14 days post dc.  Patient Goals/Self-Care Activities: Participate in Transition of Care Program/Attend TOC scheduled calls Attend all scheduled provider appointments Call pharmacy for medication refills 3-7 days in advance of running out of medications Perform all self care activities independently  Perform IADL's (shopping, preparing meals, housekeeping, managing finances) independently Call provider office for new concerns or questions   Follow Up Plan:  The patient has been provided with contact information for the care management team and has been advised to call with any health related questions or concerns.  The patient will call his PCP, Haze Servant, NP as advised to schedule a hospitalization follow up. Patient committed to calling his PCP tomorrow, 07/23/23 in the morning.  07/29/23 Update : Spoke to patient who stated he called his PCP last week as advised to schedule appt. however, according to patient, he was told that someone would call him back - still had not heard back from PCP when I called today. Contacted PCP's office on behalf of patient and was promised that someone from the office would call patient by end of day to schedule patient's hospital FU. As of 530pm, patient had not heard back from PCP office. Will call PCP's office again in AM to check on status of needed hospital follow up and call patient again tomorrow to continue TOC 30d assessment/follow up. 07/30/23 Update - patient has hospital FU appt within 14 days of his discharge scheduled for 08/03/23 - see above.        The patient verbalized understanding of instructions, educational materials, and care plan provided today and agreed to receive a  mailed copy of patient instructions, educational materials, and care plan.   The patient has been provided with contact information for  the care management team and has been advised to call with any health related questions or concerns.   Please call the care guide team at [phone] if you need to cancel or reschedule your appointment.   Please call 1-800-273-TALK (toll free, 24 hour hotline) if you are experiencing a Mental Health or Behavioral Health Crisis or need someone to talk to.  Channing Larry, RN, BA, [facility], CRRN [facility] Coordinator, Transition of Care Ph # [phone]

## 2023-07-30 NOTE — Patient Outreach (Signed)
  Care Management  Transitions of Care Program Transitions of Care Post-discharge week 2   07/30/2023 Name: Reginald Rodriguez MRN: [id number] DOB: [birth year]  Subjective: Reginald Rodriguez is a 73 y.o. year old male, who is a primary care patient of Theotis Haze ORN, NP, who was discharged from [facility] on 07/18/24 after Abdominal Aortic Aneurysm (unspec whether ruptured) surgical repair. Patient stated during [facility] FU that he has not been able to successfully obtain a Hospital FU with PCP. V[facility] TOC RNCM collaborated with PCP Haze Fleming's office ([facility], spoke to [place] on 1/9 and then [place] today, 1/10) to discuss and address transitions of care needs,notably need for Hospital FU and probable prescription refills.  On Call RN from [facility] (was told practice was closed today 1/10) obtained appointment with a PCP affiliate - Dr. Paneda on 07/02/24 at 2:20pm [address], [address].    Plan:  Will follow up with patient to provide him with appointment information and complete TOC30d program, week 2 assessment.  Channing Larry, RN, BA, [facility], CRRN [facility] Coordinator, Transition of Care Ph # [phone]

## 2023-07-30 NOTE — Patient Outreach (Signed)
 Care Management  Transitions of Care Program Transitions of Care Post-discharge week 2   07/30/2023 Name: Reginald Rodriguez MRN: [id number] DOB: 04/18/1951  Subjective: Reginald Rodriguez is a 73 y.o. year old male who is a primary care patient of Theotis Haze ORN, NP. The Care Management team Engaged with patient Engaged with patient by telephone to assess and address transitions of care needs.   Consent to Services:  Patient was given information about care management services, agreed to services, and gave verbal consent to participate.   Assessment:           SDOH Interventions    Flowsheet Row Telephone from 07/30/2023 in [place] [facility]PART[facility] Clinical Support from 09/17/2022 in [facility] - A Dept Of [place]. [facility]  SDOH Interventions    Food Insecurity Interventions -- Intervention Not Indicated  Transportation Interventions Other (Comment)  [Have provided patient with Insurance-provided transportation number which he has been utilizing. 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 Magda  RNCM Clinical Goal(s):  Patient will work with the Care Management team over the next 30 days to address Transition of Care Barriers: Medication access Medication Management Diet/Nutrition/Food Resources Support at home Provider appointments Functional/Safety Transportation through collaboration with Medical Illustrator, provider, and care team.    Interventions: Evaluation of current treatment plan related to  self management and patient's adherence to plan as established by provider   Surgery (07/18/23 Patient underwent aorto-bi-iliac stent graft repair of AAA (Abdominal Aortic Aneurysm and extension of repair onto iliac arteries x 2):  (Status: Ongoing) Short Term Goal Evaluation of current treatment plan related to Stent graft repair of Abdominal Aortic Aneurysm surgery assessed patient/caregiver understanding of surgical procedure   reviewed post-operative instructions with patient/caregiver addressed questions about post - surgical incision care  reviewed medications with patient and addressed questions reviewed scheduled provider appointments with patientpatient has 4wk postop follow up with Surgeon Debby Magda on 08/24/23 confirmed availability of transportation to all appointments 07/22/23 - patient confirmed he has adequate transportation to complete his appointments 07/29/22 obtained Hospital FU appointment with Dr. Juanice of [facility] for 08/03/23 @ 2:20pm which is an affiliated PCP/practice of patient's PCP Haze Theotis. Scheduled appt is exactly 14 days post dc.  Patient Goals/Self-Care Activities: Participate in Transition of Care Program/Attend TOC scheduled calls Attend all scheduled provider appointments Call pharmacy for medication refills 3-7 days in advance of running out of medications Perform all self care activities independently  Perform IADL's (shopping, preparing meals, housekeeping, managing finances) independently Call provider office for new concerns or questions   Follow Up Plan:  The patient has been provided with contact information for the care management team and has been advised to call with any health related questions or concerns.  The patient will call his PCP, Haze Theotis, NP as advised to schedule a hospitalization follow up. Patient committed to calling his PCP tomorrow, 07/23/23  in the morning.  07/29/23 Update : Spoke to patient who stated he called his PCP last week as advised to schedule appt. however, according to patient, he was told that someone would call him back - still had not heard back from PCP when I called today. Contacted PCP's office on behalf of patient and was promised that someone from the office would call patient by end of day to schedule patient's hospital FU. As of 530pm, patient had not heard back from PCP office. Will call PCP's office again in AM to check on status of needed hospital follow up and call patient again tomorrow to continue TOC 30d assessment/follow up. 07/30/23 Update - patient has hospital FU appt within 14 days of his discharge scheduled for 08/03/23 - see above.        Plan: The patient has been provided with contact information for the care management team and has been advised to call with any health related questions or concerns.   Channing Larry, RN, BA, [facility], CRRN [facility] Coordinator, Transition of Care Ph # [phone]

## 2023-08-03 ENCOUNTER — Encounter: Payer: Self-pay | Admitting: Nurse Practitioner

## 2023-08-03 ENCOUNTER — Ambulatory Visit (INDEPENDENT_AMBULATORY_CARE_PROVIDER_SITE_OTHER): Payer: Self-pay | Admitting: Nurse Practitioner

## 2023-08-03 VITALS — BP 108/74 | HR 112 | Temp 96.8°F | Wt 259.0 lb

## 2023-08-03 DIAGNOSIS — F17200 Nicotine dependence, unspecified, uncomplicated: Secondary | ICD-10-CM | POA: Diagnosis not present

## 2023-08-03 DIAGNOSIS — Z09 Encounter for follow-up examination after completed treatment for conditions other than malignant neoplasm: Secondary | ICD-10-CM | POA: Diagnosis not present

## 2023-08-03 DIAGNOSIS — I10 Essential (primary) hypertension: Secondary | ICD-10-CM | POA: Diagnosis not present

## 2023-08-03 DIAGNOSIS — N1832 Chronic kidney disease, stage 3b: Secondary | ICD-10-CM | POA: Diagnosis not present

## 2023-08-03 DIAGNOSIS — Z794 Long term (current) use of insulin: Secondary | ICD-10-CM | POA: Diagnosis not present

## 2023-08-03 DIAGNOSIS — I714 Abdominal aortic aneurysm, without rupture, unspecified: Secondary | ICD-10-CM

## 2023-08-03 DIAGNOSIS — E1142 Type 2 diabetes mellitus with diabetic polyneuropathy: Secondary | ICD-10-CM | POA: Diagnosis not present

## 2023-08-03 DIAGNOSIS — J439 Emphysema, unspecified: Secondary | ICD-10-CM | POA: Diagnosis not present

## 2023-08-03 NOTE — Progress Notes (Signed)
 New Patient Office Visit  Subjective:  Patient ID: Reginald Rodriguez, male    DOB: April 24, 1951  Age: 73 y.o. MRN: [id number]  CC:  Chief Complaint  Patient presents with   Hospitalization Follow-up    HPI Reginald Rodriguez is a 73 y.o. male  has a past medical history of Diabetes mellitus without complication (HCC), Hypertension, and Stroke (HCC).  Patient presents for hospital discharge follow-up. Patient of Zelda NP. Patient was on admission at the hospital from 07/18/2023 to 07/19/2023 for abdominal aortic aneurysm.  Patient underwent abdominal aortic endovascular stent graft.  Bilateral groin incisions examined today no redness, drainage or swelling noted.  Patient denies fever, chills, malaise ,pain.  Stated that he has been doing physical therapy twice weekly at home.  Has upcoming appointment with the vascular surgeon for postop visit.  He was discharged home on O2, 2 L but the patient stated that he has not needed oxygen  more than twice since he has discharge.  Smokes 2 cigarettes daily.  Currently denies shortness of breath, wheezing, cough     Wt Readings from Last 3 Encounters:  08/03/23 259 lb (117.5 kg)  07/18/23 285 lb (129.3 kg)  06/08/23 275 lb (124.7 kg)    Past Medical History:  Diagnosis Date   Diabetes mellitus without complication (HCC)    Hypertension    Stroke [facility])     Past Surgical History:  Procedure Laterality Date   ABDOMINAL AORTIC ENDOVASCULAR STENT GRAFT N/A 07/18/2023   Procedure: ABDOMINAL AORTIC ENDOVASCULAR STENT GRAFT;  Surgeon: Magda Debby SAILOR, MD;  Location: [facility] OR;  Service: Vascular;  Laterality: N/A;   ULTRASOUND GUIDANCE FOR VASCULAR ACCESS Bilateral 07/18/2023   Procedure: ULTRASOUND GUIDANCE FOR VASCULAR ACCESS;  Surgeon: Magda Debby SAILOR, MD;  Location: [facility] OR;  Service: Vascular;  Laterality: Bilateral;    History reviewed. No pertinent family history.  Social History   Socioeconomic History   Marital status: Single    Spouse name:  Not on file   Number of children: Not on file   Years of education: Not on file   Highest education level: Not on file  Occupational History   Not on file  Tobacco Use   Smoking status: Every Day    Current packs/day: 1.00    Average packs/day: 1 pack/day for 46.0 years (46.0 ttl pk-yrs)    Types: Cigarettes   Smokeless tobacco: Former  Building Services Engineer status: Never Used  Substance and Sexual Activity   Alcohol use: Not Currently   Drug use: Not Currently   Sexual activity: Not on file  Other Topics Concern   Not on file  Social History Narrative   Not on file   Social Drivers of Health   Financial Resource Strain: Medium Risk (07/30/2023)   Overall Financial Resource Strain (CARDIA)    Difficulty of Paying Living Expenses: Somewhat hard  Food Insecurity: No Food Insecurity (07/30/2023)   Hunger Vital Sign    Worried About Running Out of Food in the Last Year: Never true    Ran Out of Food in the Last Year: Never true  Transportation Needs: Unmet Transportation Needs (07/30/2023)   PRAPARE - Transportation    Lack of Transportation (Medical): Yes    Lack of Transportation (Non-Medical): Yes  Physical Activity: Inactive (07/22/2023)   Exercise Vital Sign    Days of Exercise per Week: 0 days    Minutes of Exercise per Session: 0 min  Stress: No Stress Concern Present (09/17/2022)   [place]  [employer] - Occupational Stress Questionnaire    Feeling of Stress : Not at all  Social Connections: Unknown (07/18/2023)   Social Connection and Isolation Panel [NHANES]    Frequency of Communication with Friends and Family: More than three times a week    Frequency of Social Gatherings with Friends and Family: Once a week    Attends Religious Services: 1 to 4 times per year    Active Member of [employer] or Organizations: No    Attends Banker Meetings: Patient declined    Marital Status: Patient declined  Catering Manager Violence: Not At Risk  (07/30/2023)   Humiliation, Afraid, Rape, and Kick questionnaire    Fear of Current or Ex-Partner: No    Emotionally Abused: No    Physically Abused: No    Sexually Abused: No    ROS Review of Systems  Constitutional:  Negative for appetite change, chills, fatigue and fever.  HENT:  Negative for congestion, postnasal drip, rhinorrhea and sneezing.   Respiratory:  Negative for cough, shortness of breath and wheezing.   Cardiovascular:  Negative for chest pain, palpitations and leg swelling.  Gastrointestinal:  Negative for abdominal pain, constipation, nausea and vomiting.  Genitourinary:  Negative for difficulty urinating, dysuria, flank pain and frequency.  Musculoskeletal:  Negative for arthralgias, back pain, joint swelling and myalgias.  Skin:  Negative for color change, pallor, rash and wound.  Neurological:  Negative for dizziness, facial asymmetry, weakness, numbness and headaches.  Psychiatric/Behavioral:  Negative for behavioral problems, confusion, self-injury and suicidal ideas.     Objective:   Today's Vitals: BP 108/74   Pulse (!) 112   Temp (!) 96.8 F (36 C)   Wt 259 lb (117.5 kg)   SpO2 99%   BMI 40.57 kg/m   Physical Exam Vitals and nursing note reviewed.  Constitutional:      General: He is not in acute distress.    Appearance: Normal appearance. He is obese. He is not ill-appearing, toxic-appearing or diaphoretic.  HENT:     Mouth/Throat:     Mouth: Mucous membranes are moist.     Pharynx: Oropharynx is clear. No oropharyngeal exudate or posterior oropharyngeal erythema.  Eyes:     General: No scleral icterus.       Right eye: No discharge.        Left eye: No discharge.     Extraocular Movements: Extraocular movements intact.     Conjunctiva/sclera: Conjunctivae normal.  Cardiovascular:     Rate and Rhythm: Normal rate and regular rhythm.     Pulses: Normal pulses.     Heart sounds: Normal heart sounds. No murmur heard.    No friction rub. No  gallop.  Pulmonary:     Effort: Pulmonary effort is normal. No respiratory distress.     Breath sounds: Normal breath sounds. No stridor. No wheezing, rhonchi or rales.  Chest:     Chest wall: No tenderness.  Abdominal:     General: There is no distension.     Palpations: Abdomen is soft.     Tenderness: There is no abdominal tenderness. There is no right CVA tenderness, left CVA tenderness or guarding.  Musculoskeletal:        General: No swelling, tenderness, deformity or signs of injury.     Right lower leg: No edema.     Left lower leg: No edema.     Comments: Using a cane for ambulation   Skin:    General: Skin  is warm and dry.     Capillary Refill: Capillary refill takes less than 2 seconds.     Coloration: Skin is not jaundiced or pale.     Findings: No bruising, erythema or lesion.     Comments: Bilateral groin incision clean and dry, no redness, swelling or purulent drainage noted  Neurological:     Mental Status: He is alert and oriented to person, place, and time.     Motor: No weakness.     Coordination: Coordination normal.  Psychiatric:        Mood and Affect: Mood normal.        Behavior: Behavior normal.        Thought Content: Thought content normal.        Judgment: Judgment normal.     Assessment & Plan:   Problem List Items Addressed This Visit       Cardiovascular and Mediastinum   Essential hypertension   Chronic medical condition currently well-controlled.  Continue lisinopril  2.5 mg daily, amlodipine  5 mg daily DASH diet and commitment to daily physical activity for a minimum of 30 minutes discussed and encouraged, as a part of hypertension management. The importance of attaining a healthy weight is also discussed.     08/03/2023    2:52 PM 07/19/2023    6:00 PM 07/19/2023    5:00 PM 07/19/2023    4:00 PM 07/19/2023    3:00 PM 07/19/2023    2:00 PM 07/19/2023    1:00 PM  BP/Weight  Systolic BP 108 99 117 116 106 122 114  Diastolic BP 74  77 67 89 87 82 75  Wt. (Lbs) 259        BMI 40.57 kg/m2                 Abdominal aortic aneurysm (AAA) greater than 5.5 cm in diameter in male [facility]) - Primary   S/P abdominal aortic endovascular stent graft. Bilateral groin incision clean and dry no redness swelling or drainage noted Patient encouraged to keep site clean and dry and report pain, purulent drainage, redness, swelling Follow-up with vascular specialist as planned        Respiratory   COPD with emphysema (HCC)   Not using oxygen  currently Smoking cessation encouraged        Endocrine   Type 2 diabetes mellitus with diabetic polyneuropathy, with long-term current use of insulin  (HCC)   Followed by endocrinologist Continue Farxiga  10 mg daily, Tresiba  64 units nightly, sliding scale insulin , Actos  30 mg daily Encouraged to maintain close follow-up with endocrinologist and PCP        Genitourinary   CKD (chronic kidney disease) stage 3, GFR 30-59 ml/min (HCC)   Lab Results  Component Value Date   NA 137 07/19/2023   K 3.6 07/19/2023   CO2 24 07/19/2023   GLUCOSE 161 (H) 07/19/2023   BUN 26 (H) 07/19/2023   CREATININE 1.89 (H) 07/19/2023   CALCIUM  9.3 07/19/2023   EGFR 43 (L) 10/22/2022   GFRNONAA 38 (L) 07/19/2023  Patient declined labs today Encouraged to drink at least 64 ounces of water daily to maintain hydration Avoid use of NSAIDs and other nephrotoxic agents He is on Farxiga  10 mg daily and lisinopril  2.5 mg daily        Other   Hospital discharge follow-up   Hospital chart reviewed, including discharge summary Medications reconciled and reviewed with the patient in detail       Tobacco use disorder  Need to quit smoking cigarettes discussed patient verbalized understanding       Outpatient Encounter Medications as of 08/03/2023  Medication Sig   amLODipine  (NORVASC ) 5 MG tablet TAKE 1 TABLET EVERY DAY   ammonium lactate  (AMLACTIN) 12 % cream Apply 1 Application topically as needed  for dry skin.   aspirin  EC 81 MG tablet Take 1 tablet (81 mg total) by mouth daily.   atorvastatin  (LIPITOR) 10 MG tablet Take 1 tablet (10 mg total) by mouth daily.   chlorthalidone  (HYGROTON ) 25 MG tablet TAKE 1 TABLET EVERY DAY   dapagliflozin  propanediol (FARXIGA ) 10 MG TABS tablet Take 1 tablet (10 mg total) by mouth daily before breakfast.   fenofibrate  micronized (LOFIBRA) 134 MG capsule Take 1 capsule (134 mg total) by mouth daily before breakfast.   gabapentin  (NEURONTIN ) 100 MG capsule Take 1 capsule (100 mg total) by mouth 2 (two) times daily. Please mail   insulin  aspart (NOVOLOG  FLEXPEN) 100 UNIT/ML FlexPen Max daily 75units (Patient taking differently: Inject 20-30 Units into the skin in the morning. Inject 30 units into the skin every morning and 20 units throughout the day as needed for high blood sugar.)   insulin  degludec (TRESIBA  FLEXTOUCH) 200 UNIT/ML FlexTouch Pen Inject 64 Units into the skin daily in the afternoon. (Patient taking differently: Inject 60 Units into the skin at bedtime.)   lisinopril  (ZESTRIL ) 2.5 MG tablet Take 1 tablet (2.5 mg total) by mouth daily.   pioglitazone  (ACTOS ) 30 MG tablet Take 1 tablet (30 mg total) by mouth daily.   Accu-Chek Softclix Lancets lancets Use to check blood sugar 3 times daily. (Patient not taking: Reported on 08/03/2023)   ibuprofen (ADVIL) 200 MG tablet Take 400 mg by mouth every 6 (six) hours as needed for moderate pain (pain score 4-6). (Patient not taking: Reported on 08/03/2023)   lactulose , encephalopathy, (CHRONULAC ) 10 GM/15ML SOLN Take 15 mLs (10 g total) by mouth daily as needed (constipation). (Patient not taking: Reported on 07/20/2023)   methocarbamol  (ROBAXIN ) 750 MG tablet Take 1 tablet (750 mg total) by mouth every 8 (eight) hours as needed for muscle spasms. (Patient not taking: Reported on 08/03/2023)   oxyCODONE  (OXY IR/ROXICODONE ) 5 MG immediate release tablet Take 1 tablet (5 mg total) by mouth every 6 (six) hours as  needed for moderate pain (pain score 4-6) or severe pain (pain score 7-10). (Patient not taking: Reported on 08/03/2023)   Facility-Administered Encounter Medications as of 08/03/2023  Medication   glucose chewable tablet 4 g    Follow-up: No follow-ups on file.   Icker Swigert R Trachelle Low, FNP

## 2023-08-03 NOTE — Assessment & Plan Note (Signed)
 S/P abdominal aortic endovascular stent graft. Bilateral groin incision clean and dry no redness swelling or drainage noted Patient encouraged to keep site clean and dry and report pain, purulent drainage, redness, swelling Follow-up with vascular specialist as planned

## 2023-08-03 NOTE — Assessment & Plan Note (Signed)
 Chronic medical condition currently well-controlled.  Continue lisinopril  2.5 mg daily, amlodipine  5 mg daily DASH diet and commitment to daily physical activity for a minimum of 30 minutes discussed and encouraged, as a part of hypertension management. The importance of attaining a healthy weight is also discussed.     08/03/2023    2:52 PM 07/19/2023    6:00 PM 07/19/2023    5:00 PM 07/19/2023    4:00 PM 07/19/2023    3:00 PM 07/19/2023    2:00 PM 07/19/2023    1:00 PM  BP/Weight  Systolic BP 108 99 117 116 106 122 114  Diastolic BP 74 77 67 89 87 82 75  Wt. (Lbs) 259        BMI 40.57 kg/m2

## 2023-08-03 NOTE — Patient Instructions (Signed)
 Please schedule an appointment in 2 months with your primary care doctor   It is important that you exercise regularly at least 30 minutes 5 times a week as tolerated  Think about what you will eat, plan ahead. Choose  clean, green, fresh or frozen over canned, processed or packaged foods which are more sugary, salty and fatty. 70 to 75% of food eaten should be vegetables and fruit. Three meals at set times with snacks allowed between meals, but they must be fruit or vegetables. Aim to eat over a 12 hour period , example 7 am to 7 pm, and STOP after  your last meal of the day. Drink water,generally about 64 ounces per day, no other drink is as healthy. Fruit juice is best enjoyed in a healthy way, by EATING the fruit.  Thanks for choosing [facility] we consider it a privelige to serve you.

## 2023-08-03 NOTE — Assessment & Plan Note (Signed)
 Followed by endocrinologist Continue Farxiga 10 mg daily, Tresiba 64 units nightly, sliding scale insulin, Actos 30 mg daily Encouraged to maintain close follow-up with endocrinologist and PCP

## 2023-08-03 NOTE — Assessment & Plan Note (Signed)
 Hospital chart reviewed, including discharge summary Medications reconciled and reviewed with the patient in detail

## 2023-08-03 NOTE — Assessment & Plan Note (Signed)
 Need to quit smoking cigarettes discussed patient verbalized understanding

## 2023-08-03 NOTE — Assessment & Plan Note (Signed)
 Lab Results  Component Value Date   NA 137 07/19/2023   K 3.6 07/19/2023   CO2 24 07/19/2023   GLUCOSE 161 (H) 07/19/2023   BUN 26 (H) 07/19/2023   CREATININE 1.89 (H) 07/19/2023   CALCIUM  9.3 07/19/2023   EGFR 43 (L) 10/22/2022   GFRNONAA 38 (L) 07/19/2023  Patient declined labs today Encouraged to drink at least 64 ounces of water daily to maintain hydration Avoid use of NSAIDs and other nephrotoxic agents He is on Farxiga  10 mg daily and lisinopril  2.5 mg daily

## 2023-08-03 NOTE — Assessment & Plan Note (Signed)
 Not using oxygen currently Smoking cessation encouraged

## 2023-08-05 ENCOUNTER — Telehealth: Payer: Self-pay

## 2023-08-05 ENCOUNTER — Other Ambulatory Visit: Payer: Self-pay

## 2023-08-05 DIAGNOSIS — I10 Essential (primary) hypertension: Secondary | ICD-10-CM | POA: Diagnosis not present

## 2023-08-05 DIAGNOSIS — Z8673 Personal history of transient ischemic attack (TIA), and cerebral infarction without residual deficits: Secondary | ICD-10-CM | POA: Diagnosis not present

## 2023-08-05 DIAGNOSIS — Z8679 Personal history of other diseases of the circulatory system: Secondary | ICD-10-CM | POA: Diagnosis not present

## 2023-08-05 DIAGNOSIS — Z48812 Encounter for surgical aftercare following surgery on the circulatory system: Secondary | ICD-10-CM | POA: Diagnosis not present

## 2023-08-05 DIAGNOSIS — Z95818 Presence of other cardiac implants and grafts: Secondary | ICD-10-CM | POA: Diagnosis not present

## 2023-08-05 DIAGNOSIS — E119 Type 2 diabetes mellitus without complications: Secondary | ICD-10-CM | POA: Diagnosis not present

## 2023-08-05 DIAGNOSIS — Z794 Long term (current) use of insulin: Secondary | ICD-10-CM | POA: Diagnosis not present

## 2023-08-05 DIAGNOSIS — F17210 Nicotine dependence, cigarettes, uncomplicated: Secondary | ICD-10-CM | POA: Diagnosis not present

## 2023-08-05 DIAGNOSIS — Z7982 Long term (current) use of aspirin: Secondary | ICD-10-CM | POA: Diagnosis not present

## 2023-08-05 NOTE — Patient Instructions (Signed)
Visit Information  Thank you for taking time to visit with me today. Please don't hesitate to contact me if I can be of assistance to you before our next scheduled telephone appointment.  Our next appointment is by telephone on 08/12/2023 at 1pm.  Following is a copy of your care plan:   Goals Addressed             This Visit's Progress    Transition of Care       Current Barriers:  Knowledge Deficits related to plan of care for management of Post-operative cardiac care following surgical repair of Abdominal Aortic Aneurysm rupture on 07/18/23 by Dr. Lenell Antu  RNCM Clinical Goal(s):  Patient will work with the Care Management team over the next 30 days to address Transition of Care Barriers: Medication access Medication Management Diet/Nutrition/Food Resources Support at home Provider appointments Functional/Safety Transportation through collaboration with Medical illustrator, provider, and care team.   Interventions: Evaluation of current treatment plan related to  self management and patient's adherence to plan as established by provider   Surgery (07/18/23 Patient underwent aorto-bi-iliac stent graft repair of AAA (Abdominal Aortic Aneurysm and extension of repair onto iliac arteries x 2):  (Status: Ongoing) Short Term Goal Evaluation of current treatment plan related to Stent graft repair of Abdominal Aortic Aneurysm surgery assessed patient/caregiver understanding of surgical procedure   reviewed post-operative instructions with patient/caregiver addressed questions about post - surgical incision care  reviewed medications with patient and addressed questions reviewed scheduled provider appointments with patientpatient has 4wk postop follow up with Surgeon Heath Lark on 08/24/23 confirmed availability of transportation to all appointments 07/22/23 - patient confirmed he has adequate transportation to complete his appointments 07/29/22 obtained Hospital FU appointment with Dr.  Geoffery Spruce of [facility] Patient Care for 08/03/23 @ 2:20pm which is an affiliated PCP/practice of patient's PCP Bertram Denver. Scheduled appt is exactly 14 days post dc.  Patient Goals/Self-Care Activities: Participate in Transition of Care Program/Attend TOC scheduled calls Attend all scheduled provider appointments Call pharmacy for medication refills 3-7 days in advance of running out of medications Perform all self care activities independently  Perform IADL's (shopping, preparing meals, housekeeping, managing finances) independently Call provider office for new concerns or questions   Follow Up Plan:  The patient has been provided with contact information for the care management team and has been advised to call with any health related questions or concerns.  The patient will call his PCP, Bertram Denver, NP as advised to schedule a hospitalization follow up. Patient committed to calling his PCP tomorrow, 07/23/23 in the morning.  07/29/23 Update : Spoke to patient who stated he called his PCP last week as advised to schedule appt. however, according to patient, he was told that someone would call him back - still had not heard back from PCP when I called today. Contacted PCP's office on behalf of patient and was promised that someone from the office would call patient by end of day to schedule patient's hospital FU. As of 530pm, patient had not heard back from PCP office. Will call PCP's office again in AM to check on status of needed hospital follow up and call patient again tomorrow to continue TOC 30d assessment/follow up. 07/30/23 Update - patient has hospital FU appt within 14 days of his discharge scheduled for 08/03/23 - see above.08/05/23 Update - completed hospital follow up with Edwin Dada, FNP on 08/03/23.        Patient verbalizes understanding of instructions and  care plan provided today and agrees to view in MyChart. Active MyChart status and patient understanding of how to access  instructions and care plan via MyChart confirmed with patient.     The patient has been provided with contact information for the care management team and has been advised to call with any health related questions or concerns.   Please call the care guide team at [phone] if you need to cancel or reschedule your appointment.   Please call 1-800-273-TALK (toll free, 24 hour hotline) if you are experiencing a Mental Health or Behavioral Health Crisis or need someone to talk to.  Alyse Low, RN, BA, [facility], CRRN [facility] Coordinator, Transition of Care Ph # [phone]

## 2023-08-05 NOTE — Patient Outreach (Signed)
Care Management  Transitions of Care Program Transitions of Care Post-discharge week 3   08/05/2023 Name: Reginald Rodriguez MRN: [id number] DOB: 09/16/1950  Subjective: Reginald Rodriguez is a 73 y.o. year old male who is a primary care patient of Claiborne Rigg, NP. The Care Management team Engaged with patient Engaged with patient by telephone to assess and address transitions of care needs.   Consent to Services:  Patient was given information about care management services, agreed to services, and gave verbal consent to participate.   Assessment:           SDOH Interventions    Flowsheet Row Telephone from 07/30/2023 in [place] POPULATION HEALTH DEPARTMENT Clinical Support from 09/17/2022 in [facility] Comm Health [place] - [facility] Of [place]  SDOH Interventions    Food Insecurity Interventions -- Intervention Not Indicated  Transportation Interventions Other (Comment)  [Have provided patient with Insurance-provided transportation number which he has been utilizing. 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 Lenell Antu  RNCM Clinical Goal(s):  Patient will work with the Care Management team over the next 30 days to address Transition of Care Barriers: Medication access Medication Management Diet/Nutrition/Food Resources Support at home Provider appointments Functional/Safety Transportation through collaboration with Medical illustrator, provider, and care team.    Interventions: Evaluation of current treatment plan related to  self management and patient's adherence to plan as established by provider   Surgery (07/18/23 Patient underwent aorto-bi-iliac stent graft repair of AAA (Abdominal Aortic Aneurysm and extension of repair onto iliac arteries x 2):  (Status: Ongoing) Short Term Goal Evaluation of current treatment plan related to Stent graft repair of Abdominal Aortic Aneurysm surgery assessed patient/caregiver understanding of surgical procedure   reviewed post-operative instructions with patient/caregiver addressed questions about post - surgical incision care  reviewed medications with patient and addressed questions reviewed scheduled provider appointments with patientpatient has 4wk postop follow up with Surgeon Heath Lark on 08/24/23 confirmed availability of transportation to all appointments 07/22/23 - patient confirmed he has adequate transportation to complete his appointments 07/29/22 obtained Hospital FU appointment with Dr. Geoffery Spruce of [facility] Patient Care for 08/03/23 @ 2:20pm which is an affiliated PCP/practice of patient's PCP Bertram Denver. Scheduled appt is exactly 14 days post dc.  Patient Goals/Self-Care Activities: Participate in Transition of Care Program/Attend TOC scheduled calls Attend all scheduled provider appointments Call pharmacy for medication refills 3-7 days in advance of running out of medications Perform all self care activities independently  Perform IADL's (shopping, preparing meals, housekeeping, managing finances) independently Call provider office for new concerns or questions   Follow Up Plan:  The patient has been provided with contact information for the care management team and has been advised to call with any health related questions or concerns.  The patient will call his PCP, Bertram Denver, NP as advised to schedule a hospitalization follow up. Patient committed to calling his PCP tomorrow, 07/23/23  in the morning.  07/29/23 Update : Spoke to patient who stated he called his PCP last week as advised to schedule appt. however, according to patient, he was told that someone would call him back - still had not heard back from PCP when I called today. Contacted PCP's office on behalf of patient and was promised that someone from the office would call patient by end of day to schedule patient's hospital FU. As of 530pm, patient had not heard back from PCP office. Will call PCP's office again in AM to check on status of needed hospital follow up and call patient again tomorrow to continue TOC 30d assessment/follow up. 07/30/23 Update - patient has hospital FU appt within 14 days of his discharge scheduled for 08/03/23 - see above.08/05/23 Update - completed hospital follow up with Edwin Dada, FNP on 08/03/23.        Plan: The patient has been provided with contact information for the care management team and has been advised to call with any health related questions or concerns.   Alyse Low, RN, BA, [facility], CRRN [facility] Coordinator, Transition of Care Ph # [phone]

## 2023-08-06 DIAGNOSIS — E119 Type 2 diabetes mellitus without complications: Secondary | ICD-10-CM | POA: Diagnosis not present

## 2023-08-06 DIAGNOSIS — Z8673 Personal history of transient ischemic attack (TIA), and cerebral infarction without residual deficits: Secondary | ICD-10-CM | POA: Diagnosis not present

## 2023-08-06 DIAGNOSIS — Z794 Long term (current) use of insulin: Secondary | ICD-10-CM | POA: Diagnosis not present

## 2023-08-06 DIAGNOSIS — Z8679 Personal history of other diseases of the circulatory system: Secondary | ICD-10-CM | POA: Diagnosis not present

## 2023-08-06 DIAGNOSIS — Z7982 Long term (current) use of aspirin: Secondary | ICD-10-CM | POA: Diagnosis not present

## 2023-08-06 DIAGNOSIS — Z95818 Presence of other cardiac implants and grafts: Secondary | ICD-10-CM | POA: Diagnosis not present

## 2023-08-06 DIAGNOSIS — F17210 Nicotine dependence, cigarettes, uncomplicated: Secondary | ICD-10-CM | POA: Diagnosis not present

## 2023-08-06 DIAGNOSIS — I10 Essential (primary) hypertension: Secondary | ICD-10-CM | POA: Diagnosis not present

## 2023-08-06 DIAGNOSIS — Z48812 Encounter for surgical aftercare following surgery on the circulatory system: Secondary | ICD-10-CM | POA: Diagnosis not present

## 2023-08-10 DIAGNOSIS — E119 Type 2 diabetes mellitus without complications: Secondary | ICD-10-CM | POA: Diagnosis not present

## 2023-08-10 DIAGNOSIS — Z8679 Personal history of other diseases of the circulatory system: Secondary | ICD-10-CM | POA: Diagnosis not present

## 2023-08-10 DIAGNOSIS — Z794 Long term (current) use of insulin: Secondary | ICD-10-CM | POA: Diagnosis not present

## 2023-08-10 DIAGNOSIS — Z8673 Personal history of transient ischemic attack (TIA), and cerebral infarction without residual deficits: Secondary | ICD-10-CM | POA: Diagnosis not present

## 2023-08-10 DIAGNOSIS — Z95818 Presence of other cardiac implants and grafts: Secondary | ICD-10-CM | POA: Diagnosis not present

## 2023-08-10 DIAGNOSIS — Z7982 Long term (current) use of aspirin: Secondary | ICD-10-CM | POA: Diagnosis not present

## 2023-08-10 DIAGNOSIS — I10 Essential (primary) hypertension: Secondary | ICD-10-CM | POA: Diagnosis not present

## 2023-08-10 DIAGNOSIS — Z48812 Encounter for surgical aftercare following surgery on the circulatory system: Secondary | ICD-10-CM | POA: Diagnosis not present

## 2023-08-10 DIAGNOSIS — F17210 Nicotine dependence, cigarettes, uncomplicated: Secondary | ICD-10-CM | POA: Diagnosis not present

## 2023-08-11 NOTE — Congregational Nurse Program (Signed)
  Dept: [phone]   Congregational Nurse Program Note  Date of Encounter: 08/11/2023  Call from previous [facility] resident who has apartment at [place].  States that he weighed 259 Lbs at MD office visit and that he continues to have blood glucose levels during the afternoon less than 150 and only taking breakfast insulin and the basal insulin at bedtime. Encouraged him to continue with dietary compliance to maintain blood glucose and informed him that their will be a CCNP clinic at his apartment complex later this year. Past Medical History: Past Medical History:  Diagnosis Date   Diabetes mellitus without complication (HCC)    Hypertension    Stroke [facility])     Encounter Details:  Community Questionnaire - 08/10/23 1410       Questionnaire   Ask client: Do you give verbal consent for me to treat you today? Yes    Student Assistance N/A    [place]    Encounter Setting Phone/Text/Email    Population Status Unknown   Moved from [facility] the weekend of December 6th to [place], a new community   Insurance Medicare;Medicaid    Insurance/Financial Assistance Referral N/A    Medication N/A    Medical Provider Yes    Screening Referrals Made N/A    Medical Referrals Made N/A    Medical Appointment Completed N/A    CNP Interventions Counsel;Advocate/Support;Spiritual Care    Screenings CN Performed N/A    ED Visit Averted N/A    Life-Saving Intervention Made N/A

## 2023-08-12 ENCOUNTER — Telehealth: Payer: Self-pay

## 2023-08-12 ENCOUNTER — Other Ambulatory Visit: Payer: Self-pay

## 2023-08-12 DIAGNOSIS — I714 Abdominal aortic aneurysm, without rupture, unspecified: Secondary | ICD-10-CM

## 2023-08-12 NOTE — Patient Instructions (Signed)
Visit Information  Dear Mr. Piehler,  Thank you for taking time to visit with me today, our final Transition of Care post-hospitalization follow up conversation. I want you to know it has been a privilege and pleasure to work alongside of you (via our phone calls) on your goal to improve your health.  You are doing excellent work taking your meds, going to all your follow-up appointments, eating healthier, monitoring your blood sugars, exercising, and trying to cut down smoking. You are smoking 2 or less cigarettes per day, and your goal is to cease smoking all together. Great Goals, and I believe in you!!  Please don't hesitate to contact me if I can be of assistance to you before our next scheduled telephone appointment.  Warm regards,  Elnita Maxwell   Following is a copy of your care plan:   Goals Addressed             This Visit's Progress    COMPLETED: Transition of Care       Current Barriers:  Knowledge Deficits related to plan of care for management of Post-operative cardiac care following surgical repair of Abdominal Aortic Aneurysm rupture on 07/18/23 by Dr. Lenell Antu  RNCM Clinical Goal(s):  Patient will work with the Care Management team over the next 30 days to address Transition of Care Barriers: Medication access Medication Management Diet/Nutrition/Food Resources Support at home Provider appointments Functional/Safety Transportation through collaboration with Medical illustrator, provider, and care team.   Interventions: Evaluation of current treatment plan related to  self management and patient's adherence to plan as established by provider   Surgery (07/18/23 Patient underwent aorto-bi-iliac stent graft repair of AAA (Abdominal Aortic Aneurysm and extension of repair onto iliac arteries x 2):  (Status: Ongoing) Short Term Goal Evaluation of current treatment plan related to Stent graft repair of Abdominal Aortic Aneurysm surgery assessed patient/caregiver understanding of  surgical procedure   reviewed post-operative instructions with patient/caregiver addressed questions about post - surgical incision care  reviewed medications with patient and addressed questions reviewed scheduled provider appointments with patientpatient has 4wk postop follow up with Surgeon Heath Lark on 08/24/23 confirmed availability of transportation to all appointments 07/22/23 - patient confirmed he has adequate transportation to complete his appointments 07/30/23 obtained Hospital FU appointment with Dr. Geoffery Spruce of [facility] Patient Care for 08/03/23 @ 2:20pm which is an affiliated PCP/practice of patient's PCP Bertram Denver. Scheduled appt is exactly 14 days post dc. 08/12/23 Patient participated in final TOC30d program appointment. Remains highly motivated to continue being proactive regarding his health, taking his meds as directed, and attending all Practitioner appointments. Has maintained blood pressures in the 100s-120/60-70's range with the use of antihypertensives, slow/healthy weight loss, and exercise. Remains motivated to stop smoking, down to 1-2 cigarettes per day.  Patient Goals/Self-Care Activities: Participate in Transition of Care Program/Attend TOC scheduled calls Attend all scheduled provider appointments Call pharmacy for medication refills 3-7 days in advance of running out of medications Perform all self care activities independently  Perform IADL's (shopping, preparing meals, housekeeping, managing finances) independently Call provider office for new concerns or questions   Follow Up Plan:  The patient has been provided with contact information for the care management team and has been advised to call with any health related questions or concerns.  The patient will call his PCP, Bertram Denver, NP as advised to schedule a hospitalization follow up. Patient committed to calling his PCP tomorrow, 07/23/23 in the morning.  07/29/23 Update : Spoke to patient  who stated he  called his PCP last week as advised to schedule appt. however, according to patient, he was told that someone would call him back - still had not heard back from PCP when I called today. Contacted PCP's office on behalf of patient and was promised that someone from the office would call patient by end of day to schedule patient's hospital FU. As of 530pm, patient had not heard back from PCP office. Will call PCP's office again in AM to check on status of needed hospital follow up and call patient again tomorrow to continue TOC 30d assessment/follow up. 07/30/23 Update - patient has hospital FU appt within 14 days of his discharge scheduled for 08/03/23 - see above.08/05/23 Update - completed hospital follow up with Edwin Dada, FNP on 08/03/23.        Patient verbalizes understanding of instructions and care plan provided today and agrees to view in MyChart. Active MyChart status and patient understanding of how to access instructions and care plan via MyChart confirmed with patient.     The patient has been provided with contact information for the care management team and has been advised to call with any health related questions or concerns.   Please call the care guide team at [phone] if you need to cancel or reschedule your appointment.   Please call 1-800-273-TALK (toll free, 24 hour hotline) if you are experiencing a Mental Health or Behavioral Health Crisis or need someone to talk to.  Alyse Low, RN, BA, [facility], CRRN [facility] Coordinator, Transition of Care Ph # [phone]

## 2023-08-12 NOTE — Patient Outreach (Signed)
Care Management  Transitions of Care Program Transitions of Care Post-discharge week 4   08/12/2023 Name: Reginald Rodriguez MRN: [id number] DOB: Feb 07, 1951  Subjective: Reginald Rodriguez is a 73 y.o. year old male who is a primary care patient of Claiborne Rigg, NP. The Care Management team Engaged with patient Engaged with patient by telephone to assess and address transitions of care needs.   Consent to Services:  Patient was given information about care management services, agreed to services, and gave verbal consent to participate.   Assessment:           SDOH Interventions    Flowsheet Row Telephone from 08/12/2023 in [place] POPULATION HEALTH DEPARTMENT Telephone from 07/30/2023 in [place] POPULATION HEALTH DEPARTMENT Clinical Support from 09/17/2022 in [facility] Comm Health [place] - A Dept Of [place]  SDOH Interventions     Food Insecurity Interventions -- -- Intervention Not Indicated  Housing Interventions --  [Patient moved from [facility] to [address] this past year] -- --  Transportation Interventions --  [Patient is now utilizing transportation benefit provided by his Insurer to go to/from medical appointments] Other (Comment)  [Have provided patient with Insurance-provided transportation number which he has been utilizing. States he also uses the bus as needed.] Intervention Not Indicated  Financial Strain Interventions -- -- Intervention Not Indicated  Physical Activity Interventions -- -- Patient Refused, Other (Comments)  Stress Interventions -- -- Intervention Not Indicated        Goals Addressed             This Visit's Progress    COMPLETED: Transition of Care       Current Barriers:  Knowledge Deficits related to plan of care for management of Post-operative cardiac care following surgical repair of Abdominal Aortic Aneurysm rupture on 07/18/23 by Dr. Lenell Antu  RNCM Clinical Goal(s):  Patient will work with the  Care Management team over the next 30 days to address Transition of Care Barriers: Medication access Medication Management Diet/Nutrition/Food Resources Support at home Provider appointments Functional/Safety Transportation through collaboration with Medical illustrator, provider, and care team.   Interventions: Evaluation of current treatment plan related to  self management and patient's adherence to plan as established by provider   Surgery (07/18/23 Patient underwent aorto-bi-iliac stent graft repair of AAA (Abdominal Aortic Aneurysm and extension of repair onto iliac arteries x 2):  (Status: Ongoing) Short Term Goal Evaluation of current treatment plan related to Stent graft repair of Abdominal Aortic Aneurysm surgery assessed patient/caregiver understanding of surgical procedure   reviewed post-operative instructions with patient/caregiver addressed questions about post - surgical incision care  reviewed medications with patient and addressed questions reviewed scheduled provider appointments with patientpatient has 4wk postop follow up with Surgeon Heath Lark on 08/24/23 confirmed availability of transportation to all appointments 07/22/23 - patient confirmed he has adequate transportation to complete his appointments 07/30/23 obtained Hospital FU appointment with Dr. Geoffery Spruce of [facility] Patient Care for 08/03/23 @ 2:20pm which is an affiliated PCP/practice of patient's PCP [employer]. Scheduled appt is exactly 14 days post dc. 08/12/23 Patient participated in final TOC30d program appointment. Remains highly motivated to continue being proactive regarding his health, taking his meds as directed, and attending all Practitioner appointments. Has maintained blood pressures in the 100s-120/60-70's range with the use of antihypertensives, slow/healthy weight loss, and exercise. Remains motivated to stop smoking, down to 1-2 cigarettes per day.  Patient Goals/Self-Care Activities: Participate  in Transition of Care Program/Attend  TOC scheduled calls Attend all scheduled provider appointments Call pharmacy for medication refills 3-7 days in advance of running out of medications Perform all self care activities independently  Perform IADL's (shopping, preparing meals, housekeeping, managing finances) independently Call provider office for new concerns or questions   Follow Up Plan:  The patient has been provided with contact information for the care management team and has been advised to call with any health related questions or concerns.  The patient will call his PCP, Bertram Denver, NP as advised to schedule a hospitalization follow up. Patient committed to calling his PCP tomorrow, 07/23/23 in the morning.  07/29/23 Update : Spoke to patient who stated he called his PCP last week as advised to schedule appt. however, according to patient, he was told that someone would call him back - still had not heard back from PCP when I called today. Contacted PCP's office on behalf of patient and was promised that someone from the office would call patient by end of day to schedule patient's hospital FU. As of 530pm, patient had not heard back from PCP office. Will call PCP's office again in AM to check on status of needed hospital follow up and call patient again tomorrow to continue TOC 30d assessment/follow up. 07/30/23 Update - patient has hospital FU appt within 14 days of his discharge scheduled for 08/03/23 - see above.08/05/23 Update - completed hospital follow up with Edwin Dada, FNP on 08/03/23.        Plan: The patient has been provided with contact information for the care management team and has been advised to call with any health related questions or concerns.   Alyse Low, RN, BA, [facility], CRRN [facility] Coordinator, Transition of Care Ph # [phone]

## 2023-08-13 ENCOUNTER — Telehealth: Payer: Self-pay | Admitting: *Deleted

## 2023-08-13 DIAGNOSIS — Z48812 Encounter for surgical aftercare following surgery on the circulatory system: Secondary | ICD-10-CM | POA: Diagnosis not present

## 2023-08-13 DIAGNOSIS — Z8673 Personal history of transient ischemic attack (TIA), and cerebral infarction without residual deficits: Secondary | ICD-10-CM | POA: Diagnosis not present

## 2023-08-13 DIAGNOSIS — Z95818 Presence of other cardiac implants and grafts: Secondary | ICD-10-CM | POA: Diagnosis not present

## 2023-08-13 DIAGNOSIS — Z7982 Long term (current) use of aspirin: Secondary | ICD-10-CM | POA: Diagnosis not present

## 2023-08-13 DIAGNOSIS — I10 Essential (primary) hypertension: Secondary | ICD-10-CM | POA: Diagnosis not present

## 2023-08-13 DIAGNOSIS — E119 Type 2 diabetes mellitus without complications: Secondary | ICD-10-CM | POA: Diagnosis not present

## 2023-08-13 DIAGNOSIS — F17210 Nicotine dependence, cigarettes, uncomplicated: Secondary | ICD-10-CM | POA: Diagnosis not present

## 2023-08-13 DIAGNOSIS — Z8679 Personal history of other diseases of the circulatory system: Secondary | ICD-10-CM | POA: Diagnosis not present

## 2023-08-13 DIAGNOSIS — Z794 Long term (current) use of insulin: Secondary | ICD-10-CM | POA: Diagnosis not present

## 2023-08-13 NOTE — Progress Notes (Signed)
Complex Care Management Note Care Guide Note  08/13/2023 Name: Reginald Rodriguez MRN: [id number] DOB: 06-Jan-1951   Complex Care Management Outreach Attempts: An unsuccessful telephone outreach was attempted today to offer the patient information about available complex care management services.  Follow Up Plan:  Additional outreach attempts will be made to offer the patient complex care management information and services.   Encounter Outcome:  Patient Request to Call Back  Gwenevere Ghazi  [facility] Health  [facility] Guide  Direct Dial: [phone]  Fax [phone]

## 2023-08-13 NOTE — Progress Notes (Signed)
Complex Care Management Note  Care Guide Note 08/13/2023 Name: Reginald Rodriguez MRN: [id number] DOB: August 29, 1950  Reginald Rodriguez is a 73 y.o. year old male who sees Claiborne Rigg, NP for primary care. I reached out to [name] by phone today to offer complex care management services.  [name] was given information about Complex Care Management services today including:   The Complex Care Management services include support from the care team which includes your Nurse Coordinator, Clinical Social Worker, or Pharmacist.  The Complex Care Management team is here to help remove barriers to the health concerns and goals most important to you. Complex Care Management services are voluntary, and the patient may decline or stop services at any time by request to their care team member.   Complex Care Management Consent Status: Patient agreed to services and verbal consent obtained.   Follow up plan:  Telephone appointment with complex care management team member scheduled for:  08/20/23  Encounter Outcome:  Patient Scheduled  Gwenevere Ghazi  [facility] Health  [facility], [facility] Guide  Direct Dial: [phone]  Fax [phone]

## 2023-08-16 ENCOUNTER — Ambulatory Visit
Admission: RE | Admit: 2023-08-16 | Discharge: 2023-08-16 | Disposition: A | Discharge status: Home / Self Care | Payer: Medicare HMO | Source: Ambulatory Visit | Attending: Vascular Surgery | Admitting: Vascular Surgery

## 2023-08-16 DIAGNOSIS — I7143 Infrarenal abdominal aortic aneurysm, without rupture: Secondary | ICD-10-CM | POA: Diagnosis not present

## 2023-08-16 DIAGNOSIS — Z95828 Presence of other vascular implants and grafts: Secondary | ICD-10-CM | POA: Diagnosis not present

## 2023-08-16 DIAGNOSIS — Z8679 Personal history of other diseases of the circulatory system: Secondary | ICD-10-CM

## 2023-08-16 MED ORDER — IOPAMIDOL (ISOVUE-370) INJECTION 76%
500.0000 mL | Freq: Once | INTRAVENOUS | Status: AC | PRN
  Administered 2023-08-16: 70 mL via INTRAVENOUS

## 2023-08-18 DIAGNOSIS — F17210 Nicotine dependence, cigarettes, uncomplicated: Secondary | ICD-10-CM | POA: Diagnosis not present

## 2023-08-18 DIAGNOSIS — E119 Type 2 diabetes mellitus without complications: Secondary | ICD-10-CM | POA: Diagnosis not present

## 2023-08-18 DIAGNOSIS — Z794 Long term (current) use of insulin: Secondary | ICD-10-CM | POA: Diagnosis not present

## 2023-08-18 DIAGNOSIS — Z95818 Presence of other cardiac implants and grafts: Secondary | ICD-10-CM | POA: Diagnosis not present

## 2023-08-18 DIAGNOSIS — Z7982 Long term (current) use of aspirin: Secondary | ICD-10-CM | POA: Diagnosis not present

## 2023-08-18 DIAGNOSIS — Z8679 Personal history of other diseases of the circulatory system: Secondary | ICD-10-CM | POA: Diagnosis not present

## 2023-08-18 DIAGNOSIS — Z8673 Personal history of transient ischemic attack (TIA), and cerebral infarction without residual deficits: Secondary | ICD-10-CM | POA: Diagnosis not present

## 2023-08-18 DIAGNOSIS — Z48812 Encounter for surgical aftercare following surgery on the circulatory system: Secondary | ICD-10-CM | POA: Diagnosis not present

## 2023-08-18 DIAGNOSIS — I10 Essential (primary) hypertension: Secondary | ICD-10-CM | POA: Diagnosis not present

## 2023-08-23 ENCOUNTER — Telehealth: Payer: Self-pay

## 2023-08-23 DIAGNOSIS — F17210 Nicotine dependence, cigarettes, uncomplicated: Secondary | ICD-10-CM | POA: Diagnosis not present

## 2023-08-23 DIAGNOSIS — Z8679 Personal history of other diseases of the circulatory system: Secondary | ICD-10-CM | POA: Diagnosis not present

## 2023-08-23 DIAGNOSIS — Z48812 Encounter for surgical aftercare following surgery on the circulatory system: Secondary | ICD-10-CM | POA: Diagnosis not present

## 2023-08-23 DIAGNOSIS — Z8673 Personal history of transient ischemic attack (TIA), and cerebral infarction without residual deficits: Secondary | ICD-10-CM | POA: Diagnosis not present

## 2023-08-23 DIAGNOSIS — Z7982 Long term (current) use of aspirin: Secondary | ICD-10-CM | POA: Diagnosis not present

## 2023-08-23 DIAGNOSIS — Z95818 Presence of other cardiac implants and grafts: Secondary | ICD-10-CM | POA: Diagnosis not present

## 2023-08-23 DIAGNOSIS — Z794 Long term (current) use of insulin: Secondary | ICD-10-CM | POA: Diagnosis not present

## 2023-08-23 DIAGNOSIS — I10 Essential (primary) hypertension: Secondary | ICD-10-CM | POA: Diagnosis not present

## 2023-08-23 DIAGNOSIS — E119 Type 2 diabetes mellitus without complications: Secondary | ICD-10-CM | POA: Diagnosis not present

## 2023-08-23 NOTE — Telephone Encounter (Signed)
error 

## 2023-08-23 NOTE — Progress Notes (Signed)
 VASCULAR AND VEIN SPECIALISTS OF [place]  ASSESSMENT / PLAN: 73 y.o. male status post EVAR 07/18/2023 for symptomatic abdominal aortic aneurysm.  CT angiogram shows good technical result with exclusion of the aneurysm sac and no evidence of endoleak.  The patient reports no recurrent abdominal pain.  He is very thankful.  He should follow-up in 1 year with EVAR duplex.  CHIEF COMPLAINT: Follow-up after EVAR  HISTORY OF PRESENT ILLNESS: 07/18/23: Reginald Rodriguez is a 73 y.o. male who presents to [facility] emergency department for evaluation of right-sided abdominal pain. This began yesterday morning while he had friends over to help him install a washer and dryer in his new apartment. 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 He reports the pain improved somewhat with Tylenol  and ibuprofen, but was still present when he went to sleep last night. The pain worsened today, prompting his presentation. He reports no associated nausea, vomiting, diarrhea, fever, or chills. He is an active cigarette smoker.   08/24/23: Patient returns after EVAR for symptomatic aortic aneurysm.  He reports total resolution of abdominal pain symptoms after EVAR.  He feels well overall.  Past Medical History:  Diagnosis Date   Diabetes mellitus without complication (HCC)    Hypertension    Stroke [facility])     Past Surgical History:  Procedure Laterality Date   ABDOMINAL AORTIC ENDOVASCULAR STENT GRAFT N/A 07/18/2023   Procedure: ABDOMINAL AORTIC ENDOVASCULAR STENT GRAFT;  Surgeon: Magda Debby SAILOR, MD;  Location: [facility] OR;  Service: Vascular;  Laterality: N/A;   ULTRASOUND GUIDANCE FOR VASCULAR ACCESS Bilateral 07/18/2023   Procedure: ULTRASOUND GUIDANCE FOR VASCULAR ACCESS;  Surgeon: Magda Debby SAILOR, MD;  Location: [facility] OR;  Service: Vascular;  Laterality: Bilateral;    History reviewed. No pertinent family history.  Social History   Socioeconomic History   Marital status:  Single    Spouse name: Not on file   Number of children: Not on file   Years of education: Not on file   Highest education level: Not on file  Occupational History   Not on file  Tobacco Use   Smoking status: Every Day    Current packs/day: 1.00    Average packs/day: 1 pack/day for 46.0 years (46.0 ttl pk-yrs)    Types: Cigarettes   Smokeless tobacco: Former  Building Services Engineer status: Never Used  Substance and Sexual Activity   Alcohol use: Not Currently   Drug use: Not Currently   Sexual activity: Not on file  Other Topics Concern   Not on file  Social History Narrative   Not on file   Social Drivers of Health   Financial Resource Strain: Medium Risk (08/12/2023)   Overall Financial Resource Strain (CARDIA)    Difficulty of Paying Living Expenses: Somewhat hard  Food Insecurity: No Food Insecurity (08/12/2023)   Hunger Vital Sign    Worried About Running Out of Food in the Last Year: Never true    Ran Out of Food in the Last Year: Never true  Transportation Needs: Unmet Transportation Needs (08/12/2023)   PRAPARE - Transportation    Lack of Transportation (Medical): Yes    Lack of Transportation (Non-Medical): Yes  Physical Activity: Insufficiently Active (08/12/2023)   Exercise Vital Sign    Days of Exercise per Week: 2 days    Minutes of Exercise per Session: 30 min  Stress: No Stress Concern Present (09/17/2022)   [employer] of Occupational Health -  Occupational Stress Questionnaire    Feeling of Stress : Not at all  Social Connections: Unknown (07/18/2023)   Social Connection and Isolation Panel [NHANES]    Frequency of Communication with Friends and Family: More than three times a week    Frequency of Social Gatherings with Friends and Family: Once a week    Attends Religious Services: 1 to 4 times per year    Active Member of [employer] or Organizations: No    Attends Banker Meetings: Patient declined    Marital Status: Patient declined   Catering Manager Violence: Not At Risk (08/12/2023)   Humiliation, Afraid, Rape, and Kick questionnaire    Fear of Current or Ex-Partner: No    Emotionally Abused: No    Physically Abused: No    Sexually Abused: No    Allergies  Allergen Reactions   Bee Venom Swelling   Penicillins Rash    Current Outpatient Medications  Medication Sig Dispense Refill   Accu-Chek Softclix Lancets lancets Use to check blood sugar 3 times daily. 100 each 2   amLODipine  (NORVASC ) 5 MG tablet TAKE 1 TABLET EVERY DAY 90 tablet 3   ammonium lactate  (AMLACTIN) 12 % cream Apply 1 Application topically as needed for dry skin. 385 g 5   aspirin  EC 81 MG tablet Take 1 tablet (81 mg total) by mouth daily. 100 tablet 1   atorvastatin  (LIPITOR) 10 MG tablet Take 1 tablet (10 mg total) by mouth daily. 90 tablet 3   chlorthalidone  (HYGROTON ) 25 MG tablet TAKE 1 TABLET EVERY DAY 90 tablet 1   dapagliflozin  propanediol (FARXIGA ) 10 MG TABS tablet Take 1 tablet (10 mg total) by mouth daily before breakfast. 90 tablet 3   fenofibrate  micronized (LOFIBRA) 134 MG capsule Take 1 capsule (134 mg total) by mouth daily before breakfast. 90 capsule 3   gabapentin  (NEURONTIN ) 100 MG capsule Take 1 capsule (100 mg total) by mouth 2 (two) times daily. Please mail 180 capsule 1   ibuprofen (ADVIL) 200 MG tablet Take 400 mg by mouth every 6 (six) hours as needed for moderate pain (pain score 4-6).     insulin  aspart (NOVOLOG  FLEXPEN) 100 UNIT/ML FlexPen Max daily 75units (Patient taking differently: Inject 20-30 Units into the skin in the morning. Inject 30 units into the skin every morning and 20 units throughout the day as needed for high blood sugar.) 75 mL 3   insulin  degludec (TRESIBA  FLEXTOUCH) 200 UNIT/ML FlexTouch Pen Inject 64 Units into the skin daily in the afternoon. (Patient taking differently: Inject 60 Units into the skin at bedtime.) 45 mL 3   lactulose , encephalopathy, (CHRONULAC ) 10 GM/15ML SOLN Take 15 mLs (10 g  total) by mouth daily as needed (constipation). 480 mL 3   lisinopril  (ZESTRIL ) 2.5 MG tablet Take 1 tablet (2.5 mg total) by mouth daily. 90 tablet 3   methocarbamol  (ROBAXIN ) 750 MG tablet Take 1 tablet (750 mg total) by mouth every 8 (eight) hours as needed for muscle spasms. 60 tablet 1   oxyCODONE  (OXY IR/ROXICODONE ) 5 MG immediate release tablet Take 1 tablet (5 mg total) by mouth every 6 (six) hours as needed for moderate pain (pain score 4-6) or severe pain (pain score 7-10). 20 tablet 0   pioglitazone  (ACTOS ) 30 MG tablet Take 1 tablet (30 mg total) by mouth daily. 90 tablet 3   Current Facility-Administered Medications  Medication Dose Route Frequency Provider Last Rate Last Admin   glucose chewable tablet 4 g  1 tablet  Oral Once Brien Belvie BRAVO, MD        PHYSICAL EXAM Vitals:   08/24/23 1316  BP: 90/67  Pulse: 97  Resp: 20  Temp: 98 F (36.7 C)  SpO2: 94%  Height: 5' 7 (1.702 m)    Well-appearing man in no distress Regular rate and rhythm Unlabored breathing In a wheelchair  PERTINENT LABORATORY AND RADIOLOGIC DATA  Most recent CBC    Latest Ref Rng & Units 07/19/2023    2:53 AM 07/18/2023   11:20 PM 07/18/2023    7:54 PM  CBC  WBC 4.0 - 10.5 K/uL 9.2  9.8    Hemoglobin 13.0 - 17.0 g/dL 86.2  86.3  85.3   Hematocrit 39.0 - 52.0 % 40.9  41.0  43.0   Platelets 150 - 400 K/uL 270  250       Most recent CMP    Latest Ref Rng & Units 07/19/2023    2:53 AM 07/18/2023   11:20 PM 07/18/2023    7:54 PM  CMP  Glucose 70 - 99 mg/dL 838  877    BUN 8 - 23 mg/dL 26  25    Creatinine 9.38 - 1.24 mg/dL 8.10  8.14    Sodium 864 - 145 mmol/L 137  137  140   Potassium 3.5 - 5.1 mmol/L 3.6  3.4  3.2   Chloride 98 - 111 mmol/L 101  104    CO2 22 - 32 mmol/L 24  26    Calcium  8.9 - 10.3 mg/dL 9.3  9.2      Renal function CrCl cannot be calculated (Patient's most recent lab result is older than the maximum 21 days allowed.).  Hemoglobin A1C (%)  Date Value   05/04/2023 6.8 (A)   HbA1c, POC (controlled diabetic range) (%)  Date Value  03/04/2023 7.5 (A)    LDL Chol Calc (NIH)  Date Value Ref Range Status  10/22/2022 71 0 - 99 mg/dL Final    CT angiogram 01/24/7973 Personally reviewed.  Good technical result from EVAR.  No evidence of endoleak.  Debby SAILOR. Magda, MD [facility] Vascular and Vein Specialists of [facility] Phone Number: [phone] 08/24/2023 1:38 PM   Total time spent on preparing this encounter including chart review, data review, collecting history, examining the patient, coordinating care for this established patient, 20 minutes.  Portions of this report may have been transcribed using voice recognition software.  Every effort has been made to ensure accuracy; however, inadvertent computerized transcription errors may still be present.

## 2023-08-24 ENCOUNTER — Ambulatory Visit (INDEPENDENT_AMBULATORY_CARE_PROVIDER_SITE_OTHER): Payer: Medicare HMO | Admitting: Vascular Surgery

## 2023-08-24 ENCOUNTER — Encounter: Payer: Self-pay | Admitting: Vascular Surgery

## 2023-08-24 VITALS — BP 90/67 | HR 97 | Temp 98.0°F | Resp 20 | Ht 67.0 in

## 2023-08-24 DIAGNOSIS — Z9889 Other specified postprocedural states: Secondary | ICD-10-CM

## 2023-08-24 DIAGNOSIS — Z8679 Personal history of other diseases of the circulatory system: Secondary | ICD-10-CM

## 2023-08-25 ENCOUNTER — Telehealth: Payer: Self-pay | Admitting: Nurse Practitioner

## 2023-08-25 NOTE — Telephone Encounter (Signed)
 Pt call regarding supplies he waiting for approved... he want someone to contact him

## 2023-08-27 DIAGNOSIS — E119 Type 2 diabetes mellitus without complications: Secondary | ICD-10-CM | POA: Diagnosis not present

## 2023-08-27 DIAGNOSIS — Z48812 Encounter for surgical aftercare following surgery on the circulatory system: Secondary | ICD-10-CM | POA: Diagnosis not present

## 2023-08-27 DIAGNOSIS — Z95818 Presence of other cardiac implants and grafts: Secondary | ICD-10-CM | POA: Diagnosis not present

## 2023-08-27 DIAGNOSIS — I10 Essential (primary) hypertension: Secondary | ICD-10-CM | POA: Diagnosis not present

## 2023-08-27 DIAGNOSIS — Z794 Long term (current) use of insulin: Secondary | ICD-10-CM | POA: Diagnosis not present

## 2023-08-27 DIAGNOSIS — F17210 Nicotine dependence, cigarettes, uncomplicated: Secondary | ICD-10-CM | POA: Diagnosis not present

## 2023-08-27 DIAGNOSIS — Z8679 Personal history of other diseases of the circulatory system: Secondary | ICD-10-CM | POA: Diagnosis not present

## 2023-08-27 DIAGNOSIS — Z7982 Long term (current) use of aspirin: Secondary | ICD-10-CM | POA: Diagnosis not present

## 2023-08-27 DIAGNOSIS — Z8673 Personal history of transient ischemic attack (TIA), and cerebral infarction without residual deficits: Secondary | ICD-10-CM | POA: Diagnosis not present

## 2023-08-31 DIAGNOSIS — Z8679 Personal history of other diseases of the circulatory system: Secondary | ICD-10-CM | POA: Diagnosis not present

## 2023-08-31 DIAGNOSIS — I10 Essential (primary) hypertension: Secondary | ICD-10-CM | POA: Diagnosis not present

## 2023-08-31 DIAGNOSIS — Z7982 Long term (current) use of aspirin: Secondary | ICD-10-CM | POA: Diagnosis not present

## 2023-08-31 DIAGNOSIS — Z794 Long term (current) use of insulin: Secondary | ICD-10-CM | POA: Diagnosis not present

## 2023-08-31 DIAGNOSIS — Z8673 Personal history of transient ischemic attack (TIA), and cerebral infarction without residual deficits: Secondary | ICD-10-CM | POA: Diagnosis not present

## 2023-08-31 DIAGNOSIS — F17210 Nicotine dependence, cigarettes, uncomplicated: Secondary | ICD-10-CM | POA: Diagnosis not present

## 2023-08-31 DIAGNOSIS — E119 Type 2 diabetes mellitus without complications: Secondary | ICD-10-CM | POA: Diagnosis not present

## 2023-08-31 DIAGNOSIS — Z95818 Presence of other cardiac implants and grafts: Secondary | ICD-10-CM | POA: Diagnosis not present

## 2023-08-31 DIAGNOSIS — Z48812 Encounter for surgical aftercare following surgery on the circulatory system: Secondary | ICD-10-CM | POA: Diagnosis not present

## 2023-09-03 ENCOUNTER — Other Ambulatory Visit: Payer: Self-pay

## 2023-09-03 DIAGNOSIS — Z8679 Personal history of other diseases of the circulatory system: Secondary | ICD-10-CM | POA: Diagnosis not present

## 2023-09-03 DIAGNOSIS — E119 Type 2 diabetes mellitus without complications: Secondary | ICD-10-CM | POA: Diagnosis not present

## 2023-09-03 DIAGNOSIS — Z8673 Personal history of transient ischemic attack (TIA), and cerebral infarction without residual deficits: Secondary | ICD-10-CM | POA: Diagnosis not present

## 2023-09-03 DIAGNOSIS — I10 Essential (primary) hypertension: Secondary | ICD-10-CM | POA: Diagnosis not present

## 2023-09-03 DIAGNOSIS — Z7982 Long term (current) use of aspirin: Secondary | ICD-10-CM | POA: Diagnosis not present

## 2023-09-03 DIAGNOSIS — Z48812 Encounter for surgical aftercare following surgery on the circulatory system: Secondary | ICD-10-CM | POA: Diagnosis not present

## 2023-09-03 DIAGNOSIS — Z794 Long term (current) use of insulin: Secondary | ICD-10-CM | POA: Diagnosis not present

## 2023-09-03 DIAGNOSIS — Z95818 Presence of other cardiac implants and grafts: Secondary | ICD-10-CM | POA: Diagnosis not present

## 2023-09-03 DIAGNOSIS — Z9889 Other specified postprocedural states: Secondary | ICD-10-CM

## 2023-09-03 DIAGNOSIS — F17210 Nicotine dependence, cigarettes, uncomplicated: Secondary | ICD-10-CM | POA: Diagnosis not present

## 2023-09-08 DIAGNOSIS — I10 Essential (primary) hypertension: Secondary | ICD-10-CM | POA: Diagnosis not present

## 2023-09-08 DIAGNOSIS — Z7982 Long term (current) use of aspirin: Secondary | ICD-10-CM | POA: Diagnosis not present

## 2023-09-08 DIAGNOSIS — Z8679 Personal history of other diseases of the circulatory system: Secondary | ICD-10-CM | POA: Diagnosis not present

## 2023-09-08 DIAGNOSIS — Z8673 Personal history of transient ischemic attack (TIA), and cerebral infarction without residual deficits: Secondary | ICD-10-CM | POA: Diagnosis not present

## 2023-09-08 DIAGNOSIS — Z48812 Encounter for surgical aftercare following surgery on the circulatory system: Secondary | ICD-10-CM | POA: Diagnosis not present

## 2023-09-08 DIAGNOSIS — F17210 Nicotine dependence, cigarettes, uncomplicated: Secondary | ICD-10-CM | POA: Diagnosis not present

## 2023-09-08 DIAGNOSIS — Z95818 Presence of other cardiac implants and grafts: Secondary | ICD-10-CM | POA: Diagnosis not present

## 2023-09-08 DIAGNOSIS — E119 Type 2 diabetes mellitus without complications: Secondary | ICD-10-CM | POA: Diagnosis not present

## 2023-09-08 DIAGNOSIS — Z794 Long term (current) use of insulin: Secondary | ICD-10-CM | POA: Diagnosis not present

## 2023-09-09 ENCOUNTER — Ambulatory Visit: Payer: Medicare HMO | Attending: Physician Assistant | Admitting: Physician Assistant

## 2023-09-09 VITALS — BP 114/81 | HR 92 | Wt 268.8 lb

## 2023-09-09 DIAGNOSIS — Z7409 Other reduced mobility: Secondary | ICD-10-CM | POA: Diagnosis not present

## 2023-09-09 DIAGNOSIS — I6930 Unspecified sequelae of cerebral infarction: Secondary | ICD-10-CM | POA: Diagnosis not present

## 2023-09-09 DIAGNOSIS — E1122 Type 2 diabetes mellitus with diabetic chronic kidney disease: Secondary | ICD-10-CM | POA: Diagnosis not present

## 2023-09-09 DIAGNOSIS — N1832 Chronic kidney disease, stage 3b: Secondary | ICD-10-CM

## 2023-09-09 DIAGNOSIS — Z789 Other specified health status: Secondary | ICD-10-CM | POA: Diagnosis not present

## 2023-09-09 DIAGNOSIS — M545 Low back pain, unspecified: Secondary | ICD-10-CM | POA: Diagnosis not present

## 2023-09-09 DIAGNOSIS — J432 Centrilobular emphysema: Secondary | ICD-10-CM

## 2023-09-09 DIAGNOSIS — Z794 Long term (current) use of insulin: Secondary | ICD-10-CM

## 2023-09-09 LAB — GLUCOSE, POCT (MANUAL RESULT ENTRY): POC Glucose: 130 mg/dL — AB (ref 70–99)

## 2023-09-09 MED ORDER — PIOGLITAZONE HCL 30 MG PO TABS
30.0000 mg | ORAL_TABLET | Freq: Every day | ORAL | 3 refills | Status: DC
Start: 2023-09-09 — End: 2023-10-11

## 2023-09-09 MED ORDER — FREESTYLE LIBRE 2 SENSOR MISC: 1.0000 | 99 refills | Status: DC

## 2023-09-09 MED ORDER — GABAPENTIN 100 MG PO CAPS
100.0000 mg | ORAL_CAPSULE | Freq: Two times a day (BID) | ORAL | 1 refills | Status: DC
Start: 2023-09-09 — End: 2023-10-11

## 2023-09-09 NOTE — Patient Instructions (Addendum)
[employer] [email or web address] is the website for narcotics anonymous [email or web address] (website) or [phone] is the information for alcoholics anonymous Both are free and immediately available for help with alcohol and drug use   [place] [phone] Michele Mcalpine [phone]

## 2023-09-09 NOTE — Progress Notes (Signed)
Patient ID: Reginald Rodriguez, male   DOB: 06-Apr-1951, 73 y.o.   MRN: [id number]   Reginald Rodriguez, is a 73 y.o. male  [id number]  [id number]  DOB - 1950-07-31  Chief Complaint  Patient presents with   Medication Refill   Medical Management of Chronic Issues       Subjective:   Reginald Rodriguez is a 73 y.o. male here today for RF gabapentn and needs sensors for his libre 2.  He has been out of sensors since December.  He does not see endocrine until April.  He is s/p stent for aneurysm repair from December.    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  He has limitations with his ADL due to R hemiparesis from previous stroke.  His right leg is not strong enough to step safely in and out of the shower.  His apartment did install a grip bar.  He can't stand for more than a few seconds without support so he is requesting a shower chair to make it safer for bathing.  He also needs a lift for his toilet seat.  He finds he has a hard time getting up and down off the toilet esp since the stroke/right sided weakness  and his recent surgery.    He has a hoveround for general getting around and also uses a cane No problems updated.  ALLERGIES: Allergies  Allergen Reactions   Bee Venom Swelling   Penicillins Rash    PAST MEDICAL HISTORY: Past Medical History:  Diagnosis Date   Diabetes mellitus without complication (HCC)    Hypertension    Stroke [facility])     MEDICATIONS AT HOME: Prior to Admission medications   Medication Sig Start Date End Date Taking? Authorizing Provider  Continuous Glucose Sensor (FREESTYLE LIBRE 2 SENSOR) MISC 1 each by Does not apply route every 14 (fourteen) days. 09/09/23  Yes Anders Simmonds, PA-C  Accu-Chek Softclix Lancets lancets Use to check blood sugar 3 times daily. 04/15/22   Storm Frisk, MD  amLODipine (NORVASC) 5 MG tablet TAKE 1 TABLET EVERY DAY 03/25/23   Storm Frisk, MD  ammonium lactate (AMLACTIN) 12 % cream Apply 1  Application topically as needed for dry skin. 10/07/22   Edwin Cap, DPM  aspirin EC 81 MG tablet Take 1 tablet (81 mg total) by mouth daily. 10/22/22   Storm Frisk, MD  atorvastatin (LIPITOR) 10 MG tablet Take 1 tablet (10 mg total) by mouth daily. 10/22/22   Storm Frisk, MD  chlorthalidone (HYGROTON) 25 MG tablet TAKE 1 TABLET EVERY DAY 06/07/23   Claiborne Rigg, NP  dapagliflozin propanediol (FARXIGA) 10 MG TABS tablet Take 1 tablet (10 mg total) by mouth daily before breakfast. 05/04/23   Shamleffer, Konrad Dolores, MD  fenofibrate micronized (LOFIBRA) 134 MG capsule Take 1 capsule (134 mg total) by mouth daily before breakfast. 10/22/22 10/17/23  Storm Frisk, MD  gabapentin (NEURONTIN) 100 MG capsule Take 1 capsule (100 mg total) by mouth 2 (two) times daily. Please mail 09/09/23   Anders Simmonds, PA-C  ibuprofen (ADVIL) 200 MG tablet Take 400 mg by mouth every 6 (six) hours as needed for moderate pain (pain score 4-6).    [provider]  insulin aspart (NOVOLOG FLEXPEN) 100 UNIT/ML FlexPen Max daily 75units Patient taking differently: Inject 20-30 Units into the skin in the morning. Inject 30 units into the skin every morning and 20 units throughout the day as  needed for high blood sugar. 05/04/23   Shamleffer, Konrad Dolores, MD  insulin degludec (TRESIBA FLEXTOUCH) 200 UNIT/ML FlexTouch Pen Inject 64 Units into the skin daily in the afternoon. Patient taking differently: Inject 60 Units into the skin at bedtime. 05/04/23   Shamleffer, Konrad Dolores, MD  lactulose, encephalopathy, (CHRONULAC) 10 GM/15ML SOLN Take 15 mLs (10 g total) by mouth daily as needed (constipation). 06/16/21   Storm Frisk, MD  lisinopril (ZESTRIL) 2.5 MG tablet Take 1 tablet (2.5 mg total) by mouth daily. 10/22/22   Storm Frisk, MD  methocarbamol (ROBAXIN) 750 MG tablet Take 1 tablet (750 mg total) by mouth every 8 (eight) hours as needed for muscle spasms. 04/28/23   Claiborne Rigg, NP  pioglitazone (ACTOS) 30 MG tablet Take 1 tablet (30 mg total) by mouth daily. 09/09/23   Anders Simmonds, PA-C    ROS: Neg HEENT Neg resp Neg cardiac Neg GI Neg GU Neg MS Neg psych Neg neuro  Objective:   Vitals:   09/09/23 1056  BP: 114/81  Pulse: 92  SpO2: 98%  Weight: 268 lb 12.8 oz (121.9 kg)   Exam General appearance : Awake, alert, not in any distress. Speech affected by strike but he is able to communicate clearly. Not toxic looking.  He is overweight  HEENT: Atraumatic and Normocephalic Neck: Supple, no JVD. No cervical lymphadenopathy.  Chest: Good air entry bilaterally, CTAB.  No rales/rhonchi/wheezing CVS: S1 S2 regular, no murmurs.  Extremities: B/L Lower Ext shows no edema, both legs are warm to touch Neurology: Awake alert, and oriented X 3, R sided hemiparesis including the face.   Skin: No Rash  Data Review Lab Results  Component Value Date   HGBA1C 6.8 (A) 05/04/2023   HGBA1C 7.5 (A) 03/04/2023   HGBA1C 7.9 (A) 10/21/2022    Assessment & Plan   1. Type 2 diabetes mellitus with stage 3b chronic kidney disease, with long-term current use of insulin (HCC) (Primary) Continue current regimen and see endocrine in April as planned - Glucose (CBG) - pioglitazone (ACTOS) 30 MG tablet; Take 1 tablet (30 mg total) by mouth daily.  Dispense: 90 tablet; Refill: 3 - Comprehensive metabolic panel  2. Long term (current) use of insulin (HCC)  3. Acute bilateral low back pain without sciatica stable - gabapentin (NEURONTIN) 100 MG capsule; Take 1 capsule (100 mg total) by mouth 2 (two) times daily. Please mail  Dispense: 180 capsule; Refill: 1  4. Centrilobular emphysema (HCC) Makes it difficult for him to get around  5. 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    6. Impaired mobility and activities of daily living Needs shower chair and elevated toilet seat. Address other  needs as they come up    Return in about 4 months (around 01/07/2024) for PCP for chronic conditions.  The patient was given clear instructions to go to ER or return to medical center if symptoms don't improve, worsen or new problems develop. The patient verbalized understanding. The patient was told to call to get lab results if they haven't heard anything in the next week.      Georgian Co, PA-C [facility] and [facility], [place] [phone]   09/09/2023, 11:58 AM

## 2023-09-10 ENCOUNTER — Ambulatory Visit: Payer: Self-pay

## 2023-09-10 ENCOUNTER — Telehealth: Payer: Self-pay

## 2023-09-10 LAB — COMPREHENSIVE METABOLIC PANEL
ALT: 22 [IU]/L (ref 0–44)
AST: 28 [IU]/L (ref 0–40)
Albumin: 4.6 g/dL (ref 3.9–4.9)
Alkaline Phosphatase: 45 [IU]/L (ref 44–121)
BUN/Creatinine Ratio: 13 (ref 10–24)
BUN: 23 mg/dL (ref 8–27)
Bilirubin Total: 0.3 mg/dL (ref 0.0–1.2)
CO2: 25 mmol/L (ref 20–29)
Calcium: 10.8 mg/dL — ABNORMAL HIGH (ref 8.6–10.2)
Chloride: 97 mmol/L (ref 96–106)
Creatinine, Ser: 1.79 mg/dL — ABNORMAL HIGH (ref 0.76–1.27)
Globulin, Total: 3.2 g/dL (ref 1.5–4.5)
Glucose: 92 mg/dL (ref 70–99)
Potassium: 3.6 mmol/L (ref 3.5–5.2)
Sodium: 140 mmol/L (ref 134–144)
Total Protein: 7.8 g/dL (ref 6.0–8.5)
eGFR: 40 mL/min/{1.73_m2} — ABNORMAL LOW (ref >59)

## 2023-09-10 NOTE — Patient Outreach (Signed)
Care Coordination   09/10/2023 Name: Reginald Rodriguez MRN: [id number] DOB: 09-May-1951   Care Coordination Outreach Attempts:  An unsuccessful outreach was attempted for an appointment today.  Follow Up Plan:  Additional outreach attempts will be made to offer the patient complex care management information and services.   Encounter Outcome:  Patient Request to Call Back   Care Coordination Interventions:  No, not indicated    Delsa Sale RN BSN CCM [place]  [facility], [facility] Health Nurse Care Coordinator  Direct Dial: [phone] Website: [email or web address]

## 2023-09-10 NOTE — Telephone Encounter (Signed)
 Pt was called and is aware of results, DOB was confirmed.  ?

## 2023-09-10 NOTE — Telephone Encounter (Signed)
-----   Message from Georgian Co sent at 09/10/2023  7:19 AM EST ----- Please call patient. His kidney function is impaired but stable. Liver function and electrolytes were normal. Thanks, Georgian Co, PA-C

## 2023-09-13 ENCOUNTER — Telehealth: Payer: Self-pay | Admitting: Pediatric Intensive Care

## 2023-09-13 NOTE — Telephone Encounter (Signed)
 Unable to leave message for client. Shann Medal BSN RN CCNP [phone].

## 2023-09-15 ENCOUNTER — Encounter: Payer: Self-pay | Admitting: Pediatric Intensive Care

## 2023-09-15 DIAGNOSIS — Z7982 Long term (current) use of aspirin: Secondary | ICD-10-CM | POA: Diagnosis not present

## 2023-09-15 DIAGNOSIS — Z95818 Presence of other cardiac implants and grafts: Secondary | ICD-10-CM | POA: Diagnosis not present

## 2023-09-15 DIAGNOSIS — F17210 Nicotine dependence, cigarettes, uncomplicated: Secondary | ICD-10-CM | POA: Diagnosis not present

## 2023-09-15 DIAGNOSIS — Z794 Long term (current) use of insulin: Secondary | ICD-10-CM | POA: Diagnosis not present

## 2023-09-15 DIAGNOSIS — Z8679 Personal history of other diseases of the circulatory system: Secondary | ICD-10-CM | POA: Diagnosis not present

## 2023-09-15 DIAGNOSIS — Z48812 Encounter for surgical aftercare following surgery on the circulatory system: Secondary | ICD-10-CM | POA: Diagnosis not present

## 2023-09-15 DIAGNOSIS — Z8673 Personal history of transient ischemic attack (TIA), and cerebral infarction without residual deficits: Secondary | ICD-10-CM | POA: Diagnosis not present

## 2023-09-15 DIAGNOSIS — E119 Type 2 diabetes mellitus without complications: Secondary | ICD-10-CM | POA: Diagnosis not present

## 2023-09-15 DIAGNOSIS — I10 Essential (primary) hypertension: Secondary | ICD-10-CM | POA: Diagnosis not present

## 2023-09-15 NOTE — Congregational Nurse Program (Signed)
  Dept: [phone]   Congregational Nurse Program Note  Date of Encounter: 09/15/2023  Past Medical History: Past Medical History:  Diagnosis Date   Diabetes mellitus without complication (HCC)    Hypertension    Stroke [facility])     Encounter Details:  Community Questionnaire - 09/15/23 1530       Questionnaire   Ask client: Do you give verbal consent for me to treat you today? Yes    Student Assistance N/A    Location Patient [place]    Encounter Setting CN site    Population Status Unknown   Moved from [facility] the weekend of December 6th to [place], a new community   Insurance Medicare;Medicaid    Insurance/Financial Assistance Referral N/A    Medication N/A    Medical Provider Yes    Screening Referrals Made N/A    Medical Referrals Made N/A    Medical Appointment Completed N/A    CNP Interventions Advocate/Support;Spiritual Care    Screenings CN Performed N/A    ED Visit Averted N/A    Life-Saving Intervention Made N/A            Greeted client, well-known to me. Client states that he is happy to be her, recovering well from abdominal aorta stent placement. 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 States that he is very comfortable at his apartment, has access to food and transportation. CN advised client of current clinic hours and to return when needed. Shann Medal  BSN RN CNNP [phone]

## 2023-09-16 ENCOUNTER — Telehealth: Payer: Self-pay

## 2023-09-16 NOTE — Telephone Encounter (Signed)
 [facility] called and verified that prescription was received, rep informed me that Sensor was delivered on 09/15/2023.  Patient called and informed that sensor were delivered on 09/15/2023. Pt was able to locate box of sensors.    Copied from CRM [id number]. Topic: Clinical - Prescription Issue >> Sep 16, 2023 11:50 AM Ivette P wrote: Reason for CRM: pt called in to see about Continuous Glucose Sensor (FREESTYLE LIBRE 2 SENSOR) MISC. Pt has not received through mail delivery. Pt would like to know if sensor was ordered. Pt new number [phone]

## 2023-09-20 ENCOUNTER — Telehealth: Payer: Self-pay | Admitting: Nurse Practitioner

## 2023-09-20 NOTE — Telephone Encounter (Signed)
 Copied from CRM [id number]. Topic: Clinical - Prescription Issue >> Sep 20, 2023  3:29 PM Tiffany S wrote: Reason for CRM: [facility] called to check the status of a order for medical supplies advise that I will send request over to the Nurse to complete

## 2023-09-21 NOTE — Telephone Encounter (Signed)
Form has been received and will be faxed once complete. 

## 2023-09-23 ENCOUNTER — Encounter: Payer: Self-pay | Admitting: Pediatric Intensive Care

## 2023-09-23 NOTE — Congregational Nurse Program (Signed)
  Dept: [phone]   Congregational Nurse Program Note  Date of Encounter: 09/23/2023  Past Medical History: Past Medical History:  Diagnosis Date   Diabetes mellitus without complication (HCC)    Hypertension    Stroke [facility])     Encounter Details: Client in clinic to bring unused medication for disposal. CN will take to [facility] to dispose. No concerns today. Tells story of friend that recently passed away- she gave him her bible. Client will stop in next week. Shann Medal BSN RN CNNP [phone]

## 2023-10-07 ENCOUNTER — Ambulatory Visit: Payer: Self-pay

## 2023-10-07 ENCOUNTER — Other Ambulatory Visit: Payer: Self-pay | Admitting: Nurse Practitioner

## 2023-10-07 DIAGNOSIS — E1169 Type 2 diabetes mellitus with other specified complication: Secondary | ICD-10-CM

## 2023-10-07 DIAGNOSIS — I6930 Unspecified sequelae of cerebral infarction: Secondary | ICD-10-CM

## 2023-10-07 DIAGNOSIS — I639 Cerebral infarction, unspecified: Secondary | ICD-10-CM

## 2023-10-07 NOTE — Addendum Note (Signed)
 Addended by: Riley Churches on: 10/07/2023 02:05 PM   Modules accepted: Orders

## 2023-10-07 NOTE — Patient Instructions (Signed)
 Visit Information  Thank you for taking time to visit with me today. Please don't hesitate to contact me if I can be of assistance to you.   Following are the goals we discussed today:   Goals Addressed             This Visit's Progress    RN Care Coordination Activities: further follow up needed       Care Coordination Interventions: Evaluation of current treatment plan related to impaired physical mobility and patient's adherence to plan as established by provider Provided education to patient about basic DM disease process Review of patient status, including review of consultants reports, relevant laboratory and other test results, and medication reconciliation completed Determined patient is having difficulty reading his pill bottles, educated patient about adherence packaging and patient would like to try this method Sent in basket message to [employer] requesting assistance with transitioning patient to [facility] for adherence packaging and delivery Educated patient regarding recommendations for foot care and eye care, determined patient is behind on his eye exam, he needs a new referral sent  Sent in basket message to Dr. Lonzo Cloud, Endocrinologist requesting a referral for Ophthalmology during patient's next scheduled visit scheduled for 11/02/23 Determined patient needs a shower chair and raised toilet seat following a Stroke Determined this DME was not ordered by his PCP Collaborated with Bertram Denver NP requesting the DME request for patient to help reduce fall risk  Reply from [facility] stating the DME will be ordered as requested, patient is aware Discussed plans with patient for ongoing care coordination follow up and provided patient with direct contact information for nurse care coordinator Scheduled nurse follow up call with patient for 11/04/23 @09 :30 AM           Our next appointment is by telephone on 11/04/23 at 09:30 AM  Please call the care  guide team at [phone] if you need to cancel or reschedule your appointment.   If you are experiencing a Mental Health or Behavioral Health Crisis or need someone to talk to, please call 1-800-273-TALK (toll free, 24 hour hotline) go to [facility] [address], [place] [phone])  The patient verbalized understanding of instructions, educational materials, and care plan provided today and DECLINED offer to receive copy of patient instructions, educational materials, and care plan.   Delsa Sale RN BSN CCM [place]  [facility], [facility] Health Nurse Care Coordinator  Direct Dial: [phone] Website: [email or web address]

## 2023-10-07 NOTE — Patient Outreach (Signed)
 Care Coordination   Initial Visit Note   10/07/2023 Name: Reginald Rodriguez MRN: [id number] DOB: 1950-09-23  Reginald Rodriguez is a 73 y.o. year old male who sees Claiborne Rigg, NP for primary care. I spoke with  [name] by phone today.  What matters to the patients health and wellness today?  Patient would like assistance with obtaining DME and using adherence packaging to help him better manage his medications.     Goals Addressed             This Visit's Progress    RN Care Coordination Activities: further follow up needed       Care Coordination Interventions: Evaluation of current treatment plan related to impaired physical mobility and patient's adherence to plan as established by provider Provided education to patient about basic DM disease process Review of patient status, including review of consultants reports, relevant laboratory and other test results, and medication reconciliation completed Determined patient is having difficulty reading his pill bottles, educated patient about adherence packaging and patient would like to try this method Sent in basket message to [employer] requesting assistance with transitioning patient to [facility] for adherence packaging and delivery Educated patient regarding recommendations for foot care and eye care, determined patient is behind on his eye exam, he needs a new referral sent  Sent in basket message to Dr. Lonzo Cloud, Endocrinologist requesting a referral for Ophthalmology during patient's next scheduled visit scheduled for 11/02/23 Determined patient needs a shower chair and raised toilet seat following a Stroke Determined this DME was not ordered by his PCP Collaborated with Bertram Denver NP requesting the DME request for patient to help reduce fall risk  Reply from [facility] stating the DME will be ordered as requested, patient is aware Discussed plans with patient for ongoing care coordination follow up and  provided patient with direct contact information for nurse care coordinator Scheduled nurse follow up call with patient for 11/04/23 @09 :30 AM       Interventions Today    Flowsheet Row Most Recent Value  Chronic Disease   Chronic disease during today's visit Diabetes, Other  [impaired physical mobility,  s/p CVA]  General Interventions   General Interventions Discussed/Reviewed General Interventions Discussed, General Interventions Reviewed, Doctor Visits, Labs, Durable Medical Equipment (DME), Communication with, Annual Foot Exam  Doctor Visits Discussed/Reviewed PCP, Doctor Visits Discussed, Doctor Visits Reviewed, Specialist  Durable Medical Equipment (DME) Dan Humphreys, Wheelchair, Other  [cane]  Wheelchair Motorized  Communication with PCP/Specialists, Pharmacists  Loreen Freud NP,  Georgiana Shore Ausdall Pharm D,  Dr. Lonzo Cloud  Exercise Interventions   Exercise Discussed/Reviewed Physical Activity, Exercise Discussed, Exercise Reviewed  Physical Activity Discussed/Reviewed Physical Activity Reviewed, Physical Activity Discussed, Home Exercise Program (HEP)  Education Interventions   Education Provided Provided Education  Provided Verbal Education On When to see the doctor, Labs, Blood Sugar Monitoring, Medication, Exercise, Eye Care, Foot Care  Labs Reviewed Hgb A1c, Kidney Function  Pharmacy Interventions   Pharmacy Dicussed/Reviewed Pharmacy Topics Discussed, Pharmacy Topics Reviewed, Medications and their functions, Medication Adherence, Referral to Pharmacist  Medication Adherence Unable to refill medication  Safety Interventions   Safety Discussed/Reviewed Fall Risk, Safety Reviewed, Safety Discussed, Home Safety  Home Safety Assistive Devices          SDOH assessments and interventions completed:  No     Care Coordination Interventions:  Yes, provided   Follow up plan: Follow up call scheduled for 11/04/23 @09 :30 AM    Encounter Outcome:  Patient Visit Completed

## 2023-10-11 ENCOUNTER — Other Ambulatory Visit (HOSPITAL_BASED_OUTPATIENT_CLINIC_OR_DEPARTMENT_OTHER): Admitting: Pharmacist

## 2023-10-11 ENCOUNTER — Other Ambulatory Visit: Payer: Self-pay | Admitting: Pharmacist

## 2023-10-11 ENCOUNTER — Other Ambulatory Visit: Payer: Self-pay

## 2023-10-11 ENCOUNTER — Other Ambulatory Visit (HOSPITAL_COMMUNITY): Payer: Self-pay

## 2023-10-11 ENCOUNTER — Encounter: Payer: Self-pay | Admitting: Pharmacist

## 2023-10-11 DIAGNOSIS — M545 Low back pain, unspecified: Secondary | ICD-10-CM

## 2023-10-11 DIAGNOSIS — N1832 Chronic kidney disease, stage 3b: Secondary | ICD-10-CM

## 2023-10-11 DIAGNOSIS — Z7189 Other specified counseling: Secondary | ICD-10-CM

## 2023-10-11 MED ORDER — ATORVASTATIN CALCIUM 10 MG PO TABS
10.0000 mg | ORAL_TABLET | Freq: Every day | ORAL | 3 refills | Status: AC
  Filled 2023-10-11 (×2): qty 30, 30d supply, fill #0
  Filled 2023-11-04: qty 30, 30d supply, fill #1
  Filled 2023-11-26 – 2023-11-30 (×3): qty 30, 30d supply, fill #2
  Filled 2023-12-21 – 2024-01-03 (×2): qty 30, 30d supply, fill #3
  Filled 2024-01-31: qty 30, 30d supply, fill #4
  Filled 2024-03-07: qty 30, 30d supply, fill #5
  Filled 2024-04-04: qty 30, 30d supply, fill #6
  Filled 2024-05-01: qty 30, 30d supply, fill #7
  Filled 2024-05-29: qty 30, 30d supply, fill #8
  Filled 2024-07-06: qty 30, 30d supply, fill #9
  Filled 2024-08-04: qty 30, 30d supply, fill #10

## 2023-10-11 MED ORDER — AMLODIPINE BESYLATE 5 MG PO TABS
5.0000 mg | ORAL_TABLET | Freq: Every day | ORAL | 3 refills | Status: AC
  Filled 2023-10-11 – 2023-11-04 (×3): qty 30, 30d supply, fill #0
  Filled 2023-11-26 – 2023-11-30 (×3): qty 30, 30d supply, fill #1
  Filled 2023-12-21 – 2024-01-03 (×2): qty 30, 30d supply, fill #2
  Filled 2024-01-31: qty 30, 30d supply, fill #3
  Filled 2024-03-07: qty 30, 30d supply, fill #4
  Filled 2024-04-04: qty 30, 30d supply, fill #5
  Filled 2024-05-01: qty 30, 30d supply, fill #6
  Filled 2024-05-29: qty 30, 30d supply, fill #7
  Filled 2024-07-06: qty 30, 30d supply, fill #8
  Filled 2024-08-04: qty 30, 30d supply, fill #9

## 2023-10-11 MED ORDER — GABAPENTIN 100 MG PO CAPS
100.0000 mg | ORAL_CAPSULE | Freq: Two times a day (BID) | ORAL | 1 refills | Status: DC
  Filled 2023-10-11 (×2): qty 60, 30d supply, fill #0
  Filled 2023-11-04 – 2023-11-22 (×2): qty 180, 90d supply, fill #0
  Filled 2023-11-22: qty 28, 14d supply, fill #0
  Filled 2023-11-30 – 2024-01-31 (×2): qty 60, 30d supply, fill #0
  Filled 2024-03-07: qty 60, 30d supply, fill #1
  Filled 2024-04-04: qty 60, 30d supply, fill #2
  Filled 2024-05-01: qty 60, 30d supply, fill #3
  Filled 2024-05-29: qty 60, 30d supply, fill #4
  Filled 2024-07-06: qty 60, 30d supply, fill #5

## 2023-10-11 MED ORDER — LISINOPRIL 2.5 MG PO TABS
2.5000 mg | ORAL_TABLET | Freq: Every day | ORAL | 3 refills | Status: AC
  Filled 2023-10-11 (×2): qty 30, 30d supply, fill #0
  Filled 2023-11-04: qty 30, 30d supply, fill #1
  Filled 2023-11-26 – 2023-11-30 (×3): qty 30, 30d supply, fill #2
  Filled 2023-12-21 – 2024-01-03 (×2): qty 30, 30d supply, fill #3
  Filled 2024-01-31: qty 30, 30d supply, fill #4
  Filled 2024-03-07: qty 30, 30d supply, fill #5
  Filled 2024-04-04: qty 30, 30d supply, fill #6
  Filled 2024-05-01: qty 30, 30d supply, fill #7
  Filled 2024-05-29: qty 30, 30d supply, fill #8
  Filled 2024-07-06: qty 30, 30d supply, fill #9
  Filled 2024-08-04: qty 30, 30d supply, fill #10

## 2023-10-11 MED ORDER — TRESIBA FLEXTOUCH 200 UNIT/ML ~~LOC~~ SOPN
64.0000 [IU] | PEN_INJECTOR | Freq: Every day | SUBCUTANEOUS | 3 refills | Status: DC
  Filled 2023-10-11: qty 27, 84d supply, fill #0
  Filled 2023-11-04 – 2024-01-31 (×2): qty 27, 84d supply, fill #1

## 2023-10-11 MED ORDER — ASPIRIN 81 MG PO TBEC
81.0000 mg | DELAYED_RELEASE_TABLET | Freq: Every day | ORAL | 1 refills | Status: DC
  Filled 2023-10-11 (×2): qty 30, 30d supply, fill #0
  Filled 2023-11-04: qty 30, 30d supply, fill #1
  Filled 2023-11-26 – 2023-11-30 (×3): qty 30, 30d supply, fill #2
  Filled 2023-12-21 – 2024-01-03 (×2): qty 30, 30d supply, fill #3
  Filled 2024-01-31: qty 30, 30d supply, fill #4
  Filled 2024-03-07: qty 30, 30d supply, fill #5
  Filled 2024-04-04: qty 30, 30d supply, fill #6

## 2023-10-11 MED ORDER — NOVOLOG FLEXPEN 100 UNIT/ML ~~LOC~~ SOPN
PEN_INJECTOR | SUBCUTANEOUS | 3 refills | Status: DC
  Filled 2023-10-11: qty 60, 80d supply, fill #0
  Filled 2023-11-04 – 2024-01-31 (×2): qty 60, 80d supply, fill #1

## 2023-10-11 MED ORDER — PIOGLITAZONE HCL 30 MG PO TABS
30.0000 mg | ORAL_TABLET | Freq: Every day | ORAL | 3 refills | Status: AC
  Filled 2023-10-11 (×2): qty 30, 30d supply, fill #0
  Filled 2023-10-20 – 2023-11-26 (×3): qty 90, 90d supply, fill #0
  Filled 2023-11-30 – 2024-03-07 (×4): qty 30, 30d supply, fill #0
  Filled 2024-04-04: qty 30, 30d supply, fill #1
  Filled 2024-05-01: qty 30, 30d supply, fill #2

## 2023-10-11 MED ORDER — DAPAGLIFLOZIN PROPANEDIOL 10 MG PO TABS
10.0000 mg | ORAL_TABLET | Freq: Every day | ORAL | 3 refills | Status: AC
  Filled 2023-10-11 (×2): qty 30, 30d supply, fill #0
  Filled 2023-11-04: qty 30, 30d supply, fill #1
  Filled 2023-11-26 – 2023-11-30 (×3): qty 30, 30d supply, fill #2
  Filled 2023-12-21 – 2024-01-03 (×2): qty 30, 30d supply, fill #3
  Filled 2024-01-31: qty 30, 30d supply, fill #4
  Filled 2024-03-07: qty 30, 30d supply, fill #5
  Filled 2024-04-04: qty 30, 30d supply, fill #6
  Filled 2024-05-01: qty 30, 30d supply, fill #7
  Filled 2024-05-29: qty 30, 30d supply, fill #8
  Filled 2024-07-06: qty 30, 30d supply, fill #9
  Filled 2024-08-04: qty 30, 30d supply, fill #10

## 2023-10-11 MED ORDER — FENOFIBRATE 134 MG PO CAPS
134.0000 mg | ORAL_CAPSULE | Freq: Every day | ORAL | 3 refills | Status: AC
  Filled 2023-10-11 (×2): qty 30, 30d supply, fill #0
  Filled 2023-11-04 (×2): qty 30, 30d supply, fill #1
  Filled 2023-11-26 – 2023-11-30 (×4): qty 30, 30d supply, fill #2
  Filled 2023-12-21 – 2024-01-03 (×3): qty 30, 30d supply, fill #3
  Filled 2024-01-31: qty 30, 30d supply, fill #4
  Filled 2024-03-07 (×2): qty 30, 30d supply, fill #5
  Filled 2024-04-04 (×2): qty 30, 30d supply, fill #6
  Filled 2024-05-01: qty 30, 30d supply, fill #7
  Filled 2024-05-29: qty 30, 30d supply, fill #8
  Filled 2024-07-06 – 2024-07-11 (×2): qty 30, 30d supply, fill #9
  Filled 2024-08-04: qty 30, 30d supply, fill #10

## 2023-10-11 MED ORDER — CHLORTHALIDONE 25 MG PO TABS
25.0000 mg | ORAL_TABLET | Freq: Every day | ORAL | 1 refills | Status: DC
  Filled 2023-10-11 – 2023-11-04 (×3): qty 30, 30d supply, fill #0
  Filled 2023-11-26 – 2023-11-30 (×3): qty 30, 30d supply, fill #1
  Filled 2023-12-21 – 2024-01-03 (×2): qty 30, 30d supply, fill #2

## 2023-10-11 NOTE — Progress Notes (Signed)
 10/11/2023 Name: Reginald Rodriguez MRN: [id number] DOB: [birth year]  No chief complaint on file.  Reginald Rodriguez is a 73 y.o. year old male who presented for a telephone visit.   They were referred to the pharmacist by their Case Management Team  for assistance in managing complex medication management. [name] is doing well today. He is interested in adherence packaging and delivery from our pharmacy services. He currently uses [facility] and is unhappy with their delivery times.  Care Team: Primary Care Provider: Claiborne Rigg, NP ; Next Scheduled Visit: none  Medication Access/Adherence Current Pharmacy:  [facility] [address] [place] [address] Phone: [phone] Fax: [phone]  Patient reports affordability concerns with their medications: No  Patient reports access/transportation concerns to their pharmacy: Yes  Patient reports adherence concerns with their medications:  Yes  - currently uses [facility] and is unhappy with their service.   Medication Management:  Current adherence strategy: suboptimal. Requests delivery from our pharmacy services with adherence packaging.   Patient reports Fair adherence to medications  Patient reports the following barriers to adherence: delayed delivery from [facility]  Objective:  Lab Results  Component Value Date   HGBA1C 6.8 (A) 05/04/2023    Lab Results  Component Value Date   CREATININE 1.79 (H) 09/09/2023   BUN 23 09/09/2023   NA 140 09/09/2023   K 3.6 09/09/2023   CL 97 09/09/2023   CO2 25 09/09/2023    Lab Results  Component Value Date   CHOL 133 10/22/2022   HDL 31 (L) 10/22/2022   LDLCALC 71 10/22/2022   TRIG 180 (H) 10/22/2022   CHOLHDL 4.3 10/22/2022    Medications Reviewed Today     Reviewed by Drucilla Chalet, RPH-CPP (Pharmacist) on 10/11/23 at 1113  Med List Status: <None>   Medication Order Taking? Sig Documenting Provider Last Dose Status  Informant  Accu-Chek Softclix Lancets lancets [id number] No Use to check blood sugar 3 times daily. Storm Frisk, MD Taking Active Self, Pharmacy Records  amLODipine [facility]) 5 MG tablet [id number]  Take 1 tablet (5 mg total) by mouth daily. Hoy Register, MD  Active   ammonium lactate (AMLACTIN) 12 % cream [id number] No Apply 1 Application topically as needed for dry skin. Edwin Cap, DPM Taking Active Self, Pharmacy Records  aspirin EC 81 MG tablet [id number]  Take 1 tablet (81 mg total) by mouth daily. Hoy Register, MD  Active   atorvastatin (LIPITOR) 10 MG tablet [id number]  Take 1 tablet (10 mg total) by mouth daily. Hoy Register, MD  Active   chlorthalidone (HYGROTON) 25 MG tablet [id number]  Take 1 tablet (25 mg total) by mouth daily. Hoy Register, MD  Active   Continuous Glucose Sensor (FREESTYLE LIBRE 2 SENSOR) [place] [id number]  1 each by Does not apply route every 14 (fourteen) days. Georgian Co M, PA-C  Active   dapagliflozin propanediol (FARXIGA) 10 MG TABS tablet [id number]  Take 1 tablet (10 mg total) by mouth daily before breakfast. Hoy Register, MD  Active   fenofibrate micronized (LOFIBRA) 134 MG capsule [id number]  Take 1 capsule (134 mg total) by mouth daily before breakfast. Hoy Register, MD  Active   gabapentin (NEURONTIN) 100 MG capsule [id number]  Take 1 capsule (100 mg total) by mouth 2 (two) times daily. Please mail Hoy Register, MD  Active   glucose chewable tablet 4 g [id number]   Storm Frisk, MD  Active   ibuprofen (ADVIL)  200 MG tablet [id number] No Take 400 mg by mouth every 6 (six) hours as needed for moderate pain (pain score 4-6). [provider] Taking Active Self, Pharmacy Records  insulin aspart (NOVOLOG FLEXPEN) 100 UNIT/ML FlexPen [id number]  Max daily 75units Newlin, Enobong, MD  Active   insulin degludec (TRESIBA FLEXTOUCH) 200 UNIT/ML FlexTouch Pen [id number]  Inject 64 Units into the skin daily in the afternoon. Hoy Register, MD  Active   lactulose, encephalopathy, (CHRONULAC) 10 GM/15ML SOLN [id number] No Take 15 mLs (10 g total) by mouth daily as needed (constipation).  Patient not taking: Reported on 10/07/2023   Storm Frisk, MD Not Taking Active Self, Pharmacy Records           Med Note [facility]   Tue Jul 20, 2023  6:05 PM) Needs/wants refill. 07/20/23 pm - states he has not had bowel movement x 7 days. Advised use of OTC laxative/stool softener which friend is going out to get.  lisinopril (ZESTRIL) 2.5 MG tablet [id number]  Take 1 tablet (2.5 mg total) by mouth daily. Hoy Register, MD  Active   methocarbamol (ROBAXIN) 750 MG tablet [id number] No Take 1 tablet (750 mg total) by mouth every 8 (eight) hours as needed for muscle spasms.  Patient not taking: Reported on 10/07/2023   Claiborne Rigg, NP Not Taking Active Self, Pharmacy Records  pioglitazone (ACTOS) 30 MG tablet [id number]  Take 1 tablet (30 mg total) by mouth daily. Hoy Register, MD  Active               Assessment/Plan:   Medication Management: - Currently strategy insufficient to maintain appropriate adherence to prescribed medication regimen - Suggested use of adherence packaging to organize medications. Will send rxns to [facility] with request for adherence packaging.  - Discussed collaboration with [facility]/central fill for adherence packaging and delivery.  -Patient elects to fill with [place].   Follow Up Plan: 1 week  Butch Penny, PharmD, [place], CPP Clinical Pharmacist [facility] [phone]

## 2023-10-12 ENCOUNTER — Other Ambulatory Visit: Payer: Self-pay

## 2023-10-12 ENCOUNTER — Other Ambulatory Visit: Payer: Self-pay | Admitting: Nurse Practitioner

## 2023-10-12 ENCOUNTER — Other Ambulatory Visit (HOSPITAL_COMMUNITY): Payer: Self-pay

## 2023-10-12 DIAGNOSIS — N529 Male erectile dysfunction, unspecified: Secondary | ICD-10-CM

## 2023-10-12 DIAGNOSIS — E1165 Type 2 diabetes mellitus with hyperglycemia: Secondary | ICD-10-CM

## 2023-10-14 ENCOUNTER — Telehealth: Payer: Self-pay | Admitting: Nurse Practitioner

## 2023-10-14 NOTE — Telephone Encounter (Signed)
 Copied from CRM [id number]. Topic: Clinical - Prescription Issue >> Oct 14, 2023  2:38 PM [name] wrote: Reason for CRM: Patient would like Reginald Rodriguez to give him a call about prescription//

## 2023-10-15 ENCOUNTER — Encounter: Payer: Self-pay | Admitting: Podiatry

## 2023-10-15 ENCOUNTER — Ambulatory Visit (INDEPENDENT_AMBULATORY_CARE_PROVIDER_SITE_OTHER): Admitting: Podiatry

## 2023-10-15 DIAGNOSIS — M79675 Pain in left toe(s): Secondary | ICD-10-CM | POA: Diagnosis not present

## 2023-10-15 DIAGNOSIS — E1122 Type 2 diabetes mellitus with diabetic chronic kidney disease: Secondary | ICD-10-CM

## 2023-10-15 DIAGNOSIS — Z794 Long term (current) use of insulin: Secondary | ICD-10-CM

## 2023-10-15 DIAGNOSIS — M79674 Pain in right toe(s): Secondary | ICD-10-CM | POA: Diagnosis not present

## 2023-10-15 DIAGNOSIS — B351 Tinea unguium: Secondary | ICD-10-CM | POA: Diagnosis not present

## 2023-10-15 DIAGNOSIS — N1832 Chronic kidney disease, stage 3b: Secondary | ICD-10-CM

## 2023-10-15 NOTE — Progress Notes (Signed)

## 2023-10-15 NOTE — Telephone Encounter (Signed)
 Call returned to patient. He received his medication adherence packaging from [facility] and wants to make sure he does not duplicate anything with the medication bottles he has on hand. Appt scheduled for next Thursday (4/3) for medication reconciliation.

## 2023-10-20 ENCOUNTER — Other Ambulatory Visit (HOSPITAL_COMMUNITY): Payer: Self-pay

## 2023-10-20 ENCOUNTER — Other Ambulatory Visit: Payer: Self-pay

## 2023-10-21 ENCOUNTER — Ambulatory Visit: Attending: Nurse Practitioner | Admitting: Pharmacist

## 2023-10-21 DIAGNOSIS — Z7189 Other specified counseling: Secondary | ICD-10-CM | POA: Diagnosis not present

## 2023-10-21 NOTE — Progress Notes (Signed)
 10/21/2023 Name: Gannon Heinzman MRN: [id number] DOB: 1951-06-13  No chief complaint on file.  Hansford Hirt is a 73 y.o. year old male who presented for a telephone visit.   They were referred to the pharmacist by their Case Management Team  for assistance in managing complex medication management. Mr. Hewitt is doing well today. He is now receiving adherence packaging and delivery from our pharmacy services. He brings in his pill packs today for review.  Care Team: Primary Care Provider: Claiborne Rigg, NP ; Next Scheduled Visit: none  Medication Access/Adherence Current Pharmacy:  [facility] [address] [place] [address] Phone: [phone] Fax: [phone]  Patient reports affordability concerns with their medications: No  Patient reports access/transportation concerns to their pharmacy: Yes  - for this reason, has his medications delivered to his home. Patient reports adherence concerns with their medications:  No  Medication Management:  Current adherence strategy: optimal. Receives delivery from our pharmacy services with adherence packaging.   Patient reports improved adherence to medications  Objective:  Lab Results  Component Value Date   HGBA1C 6.8 (A) 05/04/2023    Lab Results  Component Value Date   CREATININE 1.79 (H) 09/09/2023   BUN 23 09/09/2023   NA 140 09/09/2023   K 3.6 09/09/2023   CL 97 09/09/2023   CO2 25 09/09/2023    Lab Results  Component Value Date   CHOL 133 10/22/2022   HDL 31 (L) 10/22/2022   LDLCALC 71 10/22/2022   TRIG 180 (H) 10/22/2022   CHOLHDL 4.3 10/22/2022    Medications Reviewed Today     Reviewed by Drucilla Chalet, RPH-CPP (Pharmacist) on 10/21/23 at [phone]  Med List Status: <None>   Medication Order Taking? Sig Documenting Provider Last Dose Status Informant  Accu-Chek Softclix Lancets lancets [id number] Yes Use to check blood sugar 3 times daily. Storm Frisk, MD  Taking Active Self, Pharmacy Records  amLODipine [facility]) 5 MG tablet [id number] Yes Take 1 tablet (5 mg total) by mouth daily. Hoy Register, MD Taking Active   ammonium lactate (AMLACTIN) 12 % cream [id number]  Apply 1 Application topically as needed for dry skin. Edwin Cap, DPM  Active Self, Pharmacy Records  aspirin EC 81 MG tablet [id number] Yes Take 1 tablet (81 mg total) by mouth daily. Hoy Register, MD Taking Active   atorvastatin (LIPITOR) 10 MG tablet [id number] Yes Take 1 tablet (10 mg total) by mouth daily. Hoy Register, MD Taking Active   chlorthalidone (HYGROTON) 25 MG tablet [id number] Yes Take 1 tablet (25 mg total) by mouth daily. Hoy Register, MD Taking Active   Continuous Glucose Sensor (FREESTYLE LIBRE 2 SENSOR) [place] [id number] Yes 1 each by Does not apply route every 14 (fourteen) days. Georgian Co M, PA-C Taking Active   dapagliflozin propanediol (FARXIGA) 10 MG TABS tablet [id number] Yes Take 1 tablet (10 mg total) by mouth daily before breakfast. Hoy Register, MD Taking Active   fenofibrate micronized (LOFIBRA) 134 MG capsule [id number] Yes Take 1 capsule (134 mg total) by mouth daily before breakfast. Hoy Register, MD Taking Active   gabapentin (NEURONTIN) 100 MG capsule [id number] Yes Take 1 capsule (100 mg total) by mouth 2 (two) times daily. Please mail Hoy Register, MD Taking Active   glucose chewable tablet 4 g [id number]   Storm Frisk, MD  Active   ibuprofen (ADVIL) 200 MG tablet [id number]  Take 400 mg by mouth every 6 (six) hours as  needed for moderate pain (pain score 4-6). [provider]  Active Self, Pharmacy Records  insulin aspart (NOVOLOG FLEXPEN) 100 UNIT/ML FlexPen [id number] Yes Inject a max daily dose of 75 units Newlin, Enobong, MD Taking Active   insulin degludec (TRESIBA FLEXTOUCH) 200 UNIT/ML FlexTouch Pen [id number] Yes Inject 64 Units into the skin daily in the afternoon. Hoy Register, MD Taking Active   lactulose,  encephalopathy, (CHRONULAC) 10 GM/15ML SOLN [id number]  Take 15 mLs (10 g total) by mouth daily as needed (constipation). Storm Frisk, MD  Active Self, Pharmacy Records           Med Note [facility]   Tue Jul 20, 2023  6:05 PM) Needs/wants refill. 07/20/23 pm - states he has not had bowel movement x 7 days. Advised use of OTC laxative/stool softener which friend is going out to get.  lisinopril (ZESTRIL) 2.5 MG tablet [id number] Yes Take 1 tablet (2.5 mg total) by mouth daily. Hoy Register, MD Taking Active   pioglitazone (ACTOS) 30 MG tablet [id number] Yes Take 1 tablet (30 mg total) by mouth daily. Hoy Register, MD Taking Active               Assessment/Plan:   Medication Management: - Currently strategy sufficient to maintain appropriate adherence to prescribed medication regimen - Encouraged continued use of adherence packaging to organize medications.   Follow Up Plan: prn  Butch Penny, PharmD, BCACP, CPP Clinical Pharmacist [facility] [phone]

## 2023-10-27 ENCOUNTER — Encounter: Payer: Self-pay | Admitting: Pediatric Intensive Care

## 2023-10-27 DIAGNOSIS — E119 Type 2 diabetes mellitus without complications: Secondary | ICD-10-CM | POA: Diagnosis not present

## 2023-10-27 DIAGNOSIS — H2513 Age-related nuclear cataract, bilateral: Secondary | ICD-10-CM | POA: Diagnosis not present

## 2023-10-27 DIAGNOSIS — H5201 Hypermetropia, right eye: Secondary | ICD-10-CM | POA: Diagnosis not present

## 2023-10-27 NOTE — Congregational Nurse Program (Signed)
  Dept: [phone]   Congregational Nurse Program Note  Date of Encounter: 10/27/2023  Past Medical History: Past Medical History:  Diagnosis Date   Diabetes mellitus without complication (HCC)    Hypertension    Stroke [facility])     Encounter Details: Encounter with client to have talk space, review recent medical appointments and health. [employer] BSN RN CCNP [phone]

## 2023-11-02 ENCOUNTER — Ambulatory Visit (INDEPENDENT_AMBULATORY_CARE_PROVIDER_SITE_OTHER): Payer: Medicare HMO | Admitting: Internal Medicine

## 2023-11-02 VITALS — BP 120/80 | HR 106 | Ht 67.0 in | Wt 275.2 lb

## 2023-11-02 DIAGNOSIS — N1832 Chronic kidney disease, stage 3b: Secondary | ICD-10-CM | POA: Diagnosis not present

## 2023-11-02 DIAGNOSIS — E1165 Type 2 diabetes mellitus with hyperglycemia: Secondary | ICD-10-CM | POA: Diagnosis not present

## 2023-11-02 DIAGNOSIS — E1142 Type 2 diabetes mellitus with diabetic polyneuropathy: Secondary | ICD-10-CM

## 2023-11-02 DIAGNOSIS — Z794 Long term (current) use of insulin: Secondary | ICD-10-CM | POA: Diagnosis not present

## 2023-11-02 DIAGNOSIS — E119 Type 2 diabetes mellitus without complications: Secondary | ICD-10-CM | POA: Insufficient documentation

## 2023-11-02 DIAGNOSIS — E1122 Type 2 diabetes mellitus with diabetic chronic kidney disease: Secondary | ICD-10-CM | POA: Diagnosis not present

## 2023-11-02 DIAGNOSIS — E1159 Type 2 diabetes mellitus with other circulatory complications: Secondary | ICD-10-CM | POA: Diagnosis not present

## 2023-11-02 LAB — POCT GLYCOSYLATED HEMOGLOBIN (HGB A1C): Hemoglobin A1C: 6.6 % — AB (ref 4.0–5.6)

## 2023-11-02 NOTE — Patient Instructions (Addendum)
-   A1c 6.6% , Keep UP the Good Work !   - Continue  Pioglitazone (Actos) 30 mg daily  - Continue farxiga 10 tablet every morning  - Continue Tresiba 60 units once daily  - Decrease Novolog 26 units with each meal  - Novolog correctional insulin: ADD extra units on insulin to your meal-time Novolog  dose if your blood sugars are higher than 160. Use the scale below to help guide you:   Blood sugar before meal Number of units to inject  Less than 160 0 unit  161 -  190 1 units  191 -  220 2 units  221 -  250 3 units  251 -  280 4 units  281 -  310 5 units  311 -  340 6 units        HOW TO TREAT LOW BLOOD SUGARS (Blood sugar LESS THAN 70 MG/DL) Please follow the RULE OF 15 for the treatment of hypoglycemia treatment (when your (blood sugars are less than 70 mg/dL)   STEP 1: Take 15 grams of carbohydrates when your blood sugar is low, which includes:  3-4 GLUCOSE TABS  OR 3-4 OZ OF JUICE OR REGULAR SODA OR ONE TUBE OF GLUCOSE GEL    STEP 2: RECHECK blood sugar in 15 MINUTES STEP 3: If your blood sugar is still low at the 15 minute recheck --> then, go back to STEP 1 and treat AGAIN with another 15 grams of carbohydrates.

## 2023-11-02 NOTE — Progress Notes (Signed)
 Name: Reginald Rodriguez  MRN/ DOB: [id number], 1951/04/16   Age/ Sex: 73 y.o., male    PCP: Collins Dean, NP   Reason for Endocrinology Evaluation: Type 2 Diabetes Mellitus     Date of Initial Endocrinology Visit: 03/12/2021    PATIENT IDENTIFIER: Reginald Rodriguez is a 73 y.o. male with a past medical history of DM, HTN , COPD , Hx of cocaine abuse and Dyslipidemia. The patient presented for initial endocrinology clinic visit on 03/12/2021 for consultative assistance with his diabetes management.     DIABETIC HISTORY:  Reginald Rodriguez was diagnosed with DM many years ago. His hemoglobin A1c has ranged from 7.7%, peaking at 9.7% in 2021  He is intolerant to higher doses of Trulicity   On his initial visit to our clinic his A1c was 9.2 % we started farxiga, continued Trulicity and Basal/ Prandial regimen   Trulicity was stopped 09/2021 as he could only tolerate the smallest dose and we opted to start pioglitazone  SUBJECTIVE:   During the last visit (05/04/2023): A1c 6.8 %    Today (11/02/23): Reginald Rodriguez is here for a follow up on diabetes management.   He checks his sugars blood sugars multiple times daily through CGM. The patient has had hypoglycemic episodes since the last clinic visit.He is symptomatic with these episodes.   He was seen by podiatry 10/15/2023  Denies nausea  or vomiting  Denies constipation or diarrhea  Denies LE stable , but has abnormal feet feeling at night , but no tingling or numbness  Has been to the ophthalmologist, needs new glasses   HOME DIABETES REGIMEN: Tresiba 60  units daily at night  Novolog 30 units with breakfast Pioglitazone 30 mg daily  Farxiga 10 mg daily     Statin: yes ACE-I/ARB: yes   CONTINUOUS GLUCOSE MONITORING RECORD INTERPRETATION    Dates of Recording: 4/2-4/15/2025  Sensor description:freestyle libre  Results statistics:   CGM use % of time 76  Average and SD 131/24.4  Time in range  91  %  % Time Above 180 9  % Time  above 250 0  % Time Below target 0   Glycemic patterns summary: BGs are optimal throughout the day and night Hyperglycemic episodes  postprandial   Hypoglycemic episodes occurred after bolus  Overnight periods: optimal with rare hypoglycemia     DIABETIC COMPLICATIONS: Microvascular complications:  CKD III, neuropathy  Denies: retinopathy  Last eye exam: Completed 10/14/2021  Macrovascular complications:  CVA ( Right hemiparesis )  Denies: CAD, PVD   PAST HISTORY: Past Medical History:  Past Medical History:  Diagnosis Date   Diabetes mellitus without complication (HCC)    Hypertension    Stroke [facility])    Past Surgical History:  Past Surgical History:  Procedure Laterality Date   ABDOMINAL AORTIC ENDOVASCULAR STENT GRAFT N/A 07/18/2023   Procedure: ABDOMINAL AORTIC ENDOVASCULAR STENT GRAFT;  Surgeon: Carlene Che, MD;  Location: [facility] OR;  Service: Vascular;  Laterality: N/A;   ULTRASOUND GUIDANCE FOR VASCULAR ACCESS Bilateral 07/18/2023   Procedure: ULTRASOUND GUIDANCE FOR VASCULAR ACCESS;  Surgeon: Carlene Che, MD;  Location: [facility] OR;  Service: Vascular;  Laterality: Bilateral;    Social History:  reports that he has been smoking cigarettes. He has a 46 pack-year smoking history. He has quit using smokeless tobacco. He reports that he does not currently use alcohol. He reports that he does not currently use drugs. Family History: No family history on file.   HOME MEDICATIONS: Allergies  as of 11/02/2023       Reactions   Bee Venom Swelling   Penicillins Rash        Medication List        Accurate as of November 02, 2023  9:02 AM. If you have any questions, ask your nurse or doctor.          Accu-Chek Softclix Lancets lancets Use to check blood sugar 3 times daily.   amLODipine 5 MG tablet Commonly known as: NORVASC Take 1 tablet (5 mg total) by mouth daily.   ammonium lactate 12 % cream Commonly known as: AMLACTIN Apply 1 Application topically  as needed for dry skin.   Aspirin Low Dose 81 MG tablet Generic drug: aspirin EC Take 1 tablet (81 mg total) by mouth daily.   atorvastatin 10 MG tablet Commonly known as: LIPITOR Take 1 tablet (10 mg total) by mouth daily.   chlorthalidone 25 MG tablet Commonly known as: HYGROTON Take 1 tablet (25 mg total) by mouth daily.   Farxiga 10 MG Tabs tablet Generic drug: dapagliflozin propanediol Take 1 tablet (10 mg total) by mouth daily before breakfast.   fenofibrate micronized 134 MG capsule Commonly known as: LOFIBRA Take 1 capsule (134 mg total) by mouth daily before breakfast.   FreeStyle Libre 2 Sensor Misc 1 each by Does not apply route every 14 (fourteen) days.   gabapentin 100 MG capsule Commonly known as: NEURONTIN Take 1 capsule (100 mg total) by mouth 2 (two) times daily. Please mail   ibuprofen 200 MG tablet Commonly known as: ADVIL Take 400 mg by mouth every 6 (six) hours as needed for moderate pain (pain score 4-6).   lactulose (encephalopathy) 10 GM/15ML Soln Commonly known as: CHRONULAC Take 15 mLs (10 g total) by mouth daily as needed (constipation).   lisinopril 2.5 MG tablet Commonly known as: ZESTRIL Take 1 tablet (2.5 mg total) by mouth daily.   NovoLOG FlexPen 100 UNIT/ML FlexPen Generic drug: insulin aspart Inject a max daily dose of 75 units What changed: how much to take   pioglitazone 30 MG tablet Commonly known as: Actos Take 1 tablet (30 mg total) by mouth daily.   Tresiba FlexTouch 200 UNIT/ML FlexTouch Pen Generic drug: insulin degludec Inject 64 Units into the skin daily in the afternoon. What changed: how much to take         ALLERGIES: Allergies  Allergen Reactions   Bee Venom Swelling   Penicillins Rash     OBJECTIVE:   VITAL SIGNS: BP 120/80   Pulse (!) 106   Ht 5\' 7"  (1.702 m)   Wt 275 lb 3.2 oz (124.8 kg)   SpO2 94%   BMI 43.10 kg/m    PHYSICAL EXAM:  General: Pt appears well and is in NAD with a walker   Lungs: Clear with good BS bilat   Heart: RRR  Extremities:  Lower extremities - trace  pretibial edema.  Neuro: MS is good with appropriate affect, pt is alert and Ox3    DM Foot Exam 10/12/2023 per podiatry    DATA REVIEWED:  Lab Results  Component Value Date   HGBA1C 6.8 (A) 05/04/2023   HGBA1C 7.5 (A) 03/04/2023   HGBA1C 7.9 (A) 10/21/2022    Latest Reference Range & Units 09/09/23 12:02  Sodium 134 - 144 mmol/L 140  Potassium 3.5 - 5.2 mmol/L 3.6  Chloride 96 - 106 mmol/L 97  CO2 20 - 29 mmol/L 25  Glucose 70 - 99 mg/dL 92  BUN 8 - 27 mg/dL 23  Creatinine 1.61 - 0.96 mg/dL 0.45 (H)  Calcium 8.6 - 10.2 mg/dL 40.9 (H)  BUN/Creatinine Ratio 10 - 24  13  eGFR >59 mL/min/1.73 40 (L)  Alkaline Phosphatase 44 - 121 IU/L 45  Albumin 3.9 - 4.9 g/dL 4.6  AST 0 - 40 IU/L 28  ALT 0 - 44 IU/L 22  Total Protein 6.0 - 8.5 g/dL 7.8  Total Bilirubin 0.0 - 1.2 mg/dL 0.3    ASSESSMENT / PLAN / RECOMMENDATIONS:   1) Type 2 Diabetes Mellitus, Optimally controlled , With Neuropathic, CKD III and macrovascular  complications - Most recent A1c of 6.6 %. Goal A1c < 7.0 %.    -A1c continues to be optimal -He is up-to-date on his eye exams - He developed severe nausea to a small dose of trulicity in the past and has declined trying them again - He currently takes NovoLog with breakfast only, no change - I have encouraged the patient to use correction scale if needed with each meal  MEDICATIONS:   Continue pioglitazone 30 mg daily Continue farxiga  10 tablet every morning  Continue Tresiba 60 units once daily  Decrease Novolog 26 units with Breakfast only CF: Novolog (BG -130/30) TID QAC    EDUCATION / INSTRUCTIONS: BG monitoring instructions: Patient is instructed to check his blood sugars 3 times a day, before meals. Call [facility] if: BG persistently < 70  I reviewed the Rule of 15 for the treatment of hypoglycemia in detail with the patient. Literature  supplied.   2) Diabetic complications:  Eye: Does not have known diabetic retinopathy.  Neuro/ Feet: Does  have known diabetic peripheral neuropathy. Renal: Patient does  have known baseline CKD. He is  on an ACEI/ARB at present.     F/U in 6 months     Signed electronically by: Natale Bail, MD  [facility] Endocrinology  [facility] [address] [place] [place], [place] [address] Phone: [phone] FAX: [phone]   CC: Collins Dean, NP [address] [place] [address] [place] [address] Phone: [phone]  Fax: [phone]    Return to Endocrinology clinic as below: Future Appointments  Date Time Provider Department Center  11/04/2023  9:30 AM Little, Skeeter Dukes, RN [facility] None  01/17/2024 10:45 AM Ruffin Cotton, DPM [facility]

## 2023-11-04 ENCOUNTER — Encounter: Payer: Self-pay | Admitting: Internal Medicine

## 2023-11-04 ENCOUNTER — Ambulatory Visit: Payer: Self-pay

## 2023-11-04 ENCOUNTER — Other Ambulatory Visit (HOSPITAL_COMMUNITY): Payer: Self-pay

## 2023-11-04 ENCOUNTER — Other Ambulatory Visit: Payer: Self-pay

## 2023-11-04 NOTE — Patient Outreach (Signed)
 Complex Care Management   Visit Note  11/04/2023  Name:  Reginald Rodriguez MRN: [id number] DOB: 1950/07/23  Situation: Referral received for Complex Care Management related to Diabetes with Complications I obtained verbal consent from Patient.  Visit completed with patient  on the phone  Background:   Past Medical History:  Diagnosis Date   Diabetes mellitus without complication (HCC)    Hypertension    Stroke [facility])     Assessment: Patient Reported Symptoms:  Cognitive Cognitive Status: Alert and oriented to person, place, and time Cognitive/Intellectual Conditions Management [RPT]: None reported or documented in medical history or problem list   Health Maintenance Behaviors: Annual physical exam, Healthy diet, Sleep adequate Healing Pattern: Fast Health Facilitated by: Healthy diet, Rest  Neurological   Neurological Conditions: Stroke, ischemic Neurological Management Strategies: Routine screening, Medication therapy Neurological Self-Management Outcome: 4 (good)  HEENT HEENT Symptoms Reported: No symptoms reported      Cardiovascular Cardiovascular Symptoms Reported: No symptoms reported Does patient have uncontrolled Hypertension?: No Cardiovascular Conditions: Hypertension Cardiovascular Management Strategies: Medication therapy, Routine screening Weight: 275 lb (124.7 kg) (patient reported) Cardiovascular Self-Management Outcome: 4 (good)  Respiratory Respiratory Symptoms Reported: No symptoms reported Respiratory Conditions: COPD, Sleep disordered breathing Respiratory Self-Management Outcome: 4 (good)  Endocrine Patient reports the following symptoms related to hypoglycemia or hyperglycemia : No symptoms reported Is patient diabetic?: Yes Is patient checking blood sugars at home?: Yes Endocrine Conditions: Diabetes Endocrine Management Strategies: Medication therapy, Diet modification, Routine screening Endocrine Self-Management Outcome: 4 (good)  Gastrointestinal  Gastrointestinal Symptoms Reported: No symptoms reported   Nutrition Risk Screen (CP): No indicators present  Genitourinary   Genitourinary Conditions: Other Other Genitourinary Conditions: Erectile Dysfunction Genitourinary Self-Management Outcome: 3 (uncertain) Genitourinary Comment: upcoming referral to [facility]  Integumentary Integumentary Symptoms Reported: No symptoms reported    Musculoskeletal Musculoskelatal Symptoms Reviewed: No symptoms reported   Falls in the past year?: No Number of falls in past year: 1 or less Was there an injury with Fall?: No Fall Risk Category Calculator: 0 Patient Fall Risk Level: Low Fall Risk Patient at Risk for Falls Due to: No Fall Risks  Psychosocial Psychosocial Symptoms Reported: No symptoms reported   Major Change/Loss/Stressor/Fears (CP): Denies Techniques to Cope with Loss/Stress/Change: None Quality of Family Relationships: supportive Do you feel physically threatened by others?: No      11/04/2023   10:42 AM  Depression screen PHQ 2/9  Decreased Interest 0  Down, Depressed, Hopeless 0  PHQ - 2 Score 0    Vitals:   11/04/23 1004  BP: 120/88    Medications Reviewed Today     Reviewed by Riley Churches, RN (Registered Nurse) on 11/04/23 at 1022  Med List Status: <None>   Medication Order Taking? Sig Documenting Provider Last Dose Status Informant  Accu-Chek Softclix Lancets lancets [id number]  Use to check blood sugar 3 times daily. Storm Frisk, MD  Active Self, Pharmacy Records  amLODipine Brand [facility]) 5 MG tablet [id number] Yes Take 1 tablet (5 mg total) by mouth daily. Hoy Register, MD Taking Active   ammonium lactate (AMLACTIN) 12 % cream [id number] No Apply 1 Application topically as needed for dry skin.  Patient not taking: Reported on 11/04/2023   Edwin Cap, DPM Not Taking Active Self, Pharmacy Records  aspirin EC 81 MG tablet [id number] Yes Take 1 tablet (81 mg total) by mouth daily. Hoy Register,  MD Taking Active   atorvastatin (LIPITOR) 10 MG tablet [id number] Yes Take 1 tablet (10 mg total)  by mouth daily. Newlin, Enobong, MD Taking Active   chlorthalidone (HYGROTON) 25 MG tablet [id number] Yes Take 1 tablet (25 mg total) by mouth daily. Newlin, Enobong, MD Taking Active   Continuous Glucose Sensor (FREESTYLE LIBRE 2 SENSOR) [place] [id number]  1 each by Does not apply route every 14 (fourteen) days. Dulce Gibbs M, PA-C  Active   dapagliflozin propanediol (FARXIGA) 10 MG TABS tablet [id number] Yes Take 1 tablet (10 mg total) by mouth daily before breakfast. Newlin, Enobong, MD Taking Active   fenofibrate micronized (LOFIBRA) 134 MG capsule [id number] Yes Take 1 capsule (134 mg total) by mouth daily before breakfast. Newlin, Enobong, MD Taking Active   gabapentin (NEURONTIN) 100 MG capsule [id number] Yes Take 1 capsule (100 mg total) by mouth 2 (two) times daily. Please mail Joaquin Mulberry, MD Taking Active   glucose chewable tablet 4 g [id number]   Vernell Goldsmith, MD  Active   ibuprofen (ADVIL) 200 MG tablet [id number] Yes Take 400 mg by mouth every 6 (six) hours as needed for moderate pain (pain score 4-6). [provider] Taking Active Self, Pharmacy Records  insulin aspart (NOVOLOG FLEXPEN) 100 UNIT/ML FlexPen [id number] Yes Inject a max daily dose of 75 units  Patient taking differently: 26 Units. Take 26 units daily per Dr. Shamleffer   Newlin, Enobong, MD Taking Active   insulin degludec (TRESIBA FLEXTOUCH) 200 UNIT/ML FlexTouch Pen [id number] Yes Inject 64 Units into the skin daily in the afternoon.  Patient taking differently: Inject 60 Units into the skin daily in the afternoon.   Newlin, Enobong, MD Taking Active   lactulose, encephalopathy, (CHRONULAC) 10 GM/15ML SOLN [id number] Yes Take 15 mLs (10 g total) by mouth daily as needed (constipation). Vernell Goldsmith, MD Taking Active Self, Pharmacy Records           Med Note (Glennon Kopko L   Thu Nov 04, 2023 10:21 AM)     lisinopril (ZESTRIL) 2.5 MG tablet [id number] Yes Take 1 tablet (2.5 mg total) by mouth daily. Newlin, Enobong, MD Taking Active   pioglitazone (ACTOS) 30 MG tablet [id number] Yes Take 1 tablet (30 mg total) by mouth daily. Newlin, Enobong, MD Taking Active             Recommendation:   PCP Follow-up  Follow Up Plan:   Telephone follow up appointment date/time:  11/19/23 @12 :30 PM  Louanne Roussel RN BSN CCM [place]  [facility], [facility] Health Nurse Care Coordinator  Direct Dial: [phone] Website: [email or web address]

## 2023-11-04 NOTE — Patient Instructions (Signed)
 Visit Information  Thank you for taking time to visit with me today. Please don't hesitate to contact me if I can be of assistance to you before our next scheduled appointment.  Our next appointment is by telephone on 11/19/23 at 12:30 PM Please call the care guide team at [phone] if you need to cancel or reschedule your appointment.   Following is a copy of your care plan:   Goals Addressed             This Visit's Progress    COMPLETED: RN Care Coordination Activities: further follow up needed       Care Coordination Interventions: See new goal        VBCI RN Care Plan related to Self-Care Deficit       Problems:  Chronic Disease Management support and education needs related to self-care deficit  Goal: Over the next 30 days the Patient will continue to work with RN Care Manager and/or Social Worker to address care management and care coordination needs related to self-care deficit  as evidenced by adherence to care management team scheduled appointments      Interventions:   Evaluation of current treatment plan related to  self-care deficit , self-management and patient's adherence to plan as established by provider Placed joint call with patient and [employer] to check the status for patient's shower chair and raised toilet seat Updated patient's contact number with [employer], determined patient's Rx for DME was received and is in progress to be completed by 11/02/23, discussed next steps with [employer] and patient Discussed having [employer] pick up 5 large portable tanks from patient's apartment, sent in basket message to PCP provider requesting an Rx for small portable Oxygen tank with bag be sent to [employer] [facility]  Discussed plans with patient for ongoing care management follow up and provided patient with direct contact information for care management team   Patient Self-Care Activities:  Attend all scheduled provider appointments Call pharmacy for medication refills 3-7 days in  advance of running out of medications Call provider office for new concerns or questions  Perform all self care activities independently  Perform IADL's (shopping, preparing meals, housekeeping, managing finances) independently Take medications as prescribed    Plan:  Telephone follow up appointment with care management team member scheduled for:  11/19/23 @12 :30 PM      VBCI RN Care Plan related to type II diabetes with polyneuropathy       Problems:  Chronic Disease Management support and education needs related to DMII No Advanced Directives in place  Goal: Over the next 30 days the Patient will continue to work with Medical illustrator and/or Social Worker to address care management and care coordination needs related to DMII as evidenced by adherence to care management team scheduled appointments      Interventions:   Diabetes Interventions: Assessed patient's understanding of A1c goal: <7% Provided education to patient about basic DM disease process Reviewed medications with patient and discussed importance of medication adherence, confirmed patient is using compliance packaging  Advised patient, providing education and rationale, to check cbg daily before meals and at bedtime and record, calling PCP for findings outside established parameters Review of patient status, including review of consultants reports, relevant laboratory and other test results, and medications completed Counseled on Diabetic diet, my plate method, 098 minutes of moderate intensity exercise/week Lab Results  Component Value Date   HGBA1C 6.6 (A) 11/02/2023    Patient Self-Care Activities:  Attend all scheduled  provider appointments Call pharmacy for medication refills 3-7 days in advance of running out of medications Call provider office for new concerns or questions  Take medications as prescribed   check blood sugar at prescribed times: before meals and at bedtime check feet daily for cuts, sores or  redness enter blood sugar readings and medication or insulin into daily log take the blood sugar log to all doctor visits drink 6 to 8 glasses of water each day fill half of plate with vegetables manage portion size Establish a routine exercise regimen   Plan:  Telephone follow up appointment with care management team member scheduled for:  11/19/23 @12 :30 PM         Please call 1-800-273-TALK (toll free, 24 hour hotline) if you are experiencing a Mental Health or Behavioral Health Crisis or need someone to talk to.  The patient verbalized understanding of instructions, educational materials, and care plan provided today and DECLINED offer to receive copy of patient instructions, educational materials, and care plan.   Louanne Roussel RN BSN CCM   [facility], [facility] Health Nurse Care Coordinator  Direct Dial: [phone] Website: [email or web address]

## 2023-11-05 ENCOUNTER — Other Ambulatory Visit: Payer: Self-pay

## 2023-11-08 ENCOUNTER — Other Ambulatory Visit: Payer: Self-pay | Admitting: Nurse Practitioner

## 2023-11-08 DIAGNOSIS — J432 Centrilobular emphysema: Secondary | ICD-10-CM

## 2023-11-17 ENCOUNTER — Other Ambulatory Visit (HOSPITAL_COMMUNITY): Payer: Self-pay

## 2023-11-17 ENCOUNTER — Other Ambulatory Visit: Payer: Self-pay

## 2023-11-17 ENCOUNTER — Telehealth: Payer: Self-pay | Admitting: Pharmacist

## 2023-11-17 MED ORDER — FREESTYLE LIBRE 2 SENSOR MISC
1.0000 | 99 refills | Status: DC
  Filled 2023-11-17: qty 2, 28d supply, fill #0
  Filled 2023-12-21: qty 2, 28d supply, fill #1

## 2023-11-17 NOTE — Telephone Encounter (Signed)
 Hey friends,   I saw this patient earlier this month for a medication reconciliation. He also requested a bedside commode and a shower chair. He tells me today that he was notified "by the same people who helps him with his oxygen" that they would be delivering these. However, he has not received anything yet. I'm unsure who to reach out to.  Reginald Rodriguez,   I looked at a care coordination note from a nurse, and I believe he uses [employer] for his oxygen services. Do you know if they also handle the supplies listed above? He claims he was contacted by someone about the order. I can't remember if we even submitted an order for these. For that reason, just in case, I looped Zelda into this message as well.

## 2023-11-18 ENCOUNTER — Telehealth: Payer: Self-pay | Admitting: Nurse Practitioner

## 2023-11-18 NOTE — Telephone Encounter (Signed)
 I spoke to the patient and shared the information that [employer] provided. He said that [employer] already called him and he told them to forget about it. He can't get there to pick it up. I told him that they can deliver if he would ask for that. I told him I can call them back or I can send the order to another company and he no, he doesn't want to bother with this now.  He went on to explain that he really needs the shower chair.  Again, I told him that I can send the order to another DME company and again he was adamant that he does not want me to do that.  He said he just wants to forget about it.  I then spoke to [place] RN/ Congregational Nurse at [employer] and asked that she speak to him about the shower chair/ tub transfer bench and she said she would speak to him when she sees him

## 2023-11-18 NOTE — Telephone Encounter (Signed)
 Copied from CRM [id number]. Topic: General - Other  >> Nov 18, 2023  8:23 AM Elle L wrote: Reason for CRM: [employer] was returning a call to Burnett Carson, RN regarding the patient. She advised that the patient has to go to the store to pick up the equipment or they need an email address in order to ship it and they have been unable to reach the patient.  >> Nov 18, 2023  8:26 AM Elle L wrote: Their number is [phone].

## 2023-11-19 ENCOUNTER — Other Ambulatory Visit: Payer: Self-pay

## 2023-11-19 NOTE — Patient Instructions (Signed)
 Visit Information  Thank you for taking time to visit with me today. Please don't hesitate to contact me if I can be of assistance to you before our next scheduled appointment.  Your next care management appointment is by telephone on Thursday, June 5 at 12:30 PM   Please call the care guide team at [phone] if you need to cancel, schedule, or reschedule an appointment.   Please call 1-800-273-TALK (toll free, 24 hour hotline) if you are experiencing a Mental Health or Behavioral Health Crisis or need someone to talk to.  Louanne Roussel RN BSN CCM [place]  [facility], [facility] Health Nurse Care Coordinator  Direct Dial: [phone] Website: [email or web address]

## 2023-11-19 NOTE — Patient Outreach (Signed)
 Complex Care Management   Visit Note  11/19/2023  Name:  Reginald Rodriguez MRN: [id number] DOB: 03-10-1951  Situation: Referral received for Complex Care Management related to Diabetes with Complications and Self-Care deficit.  I obtained verbal consent from Patient.  Visit completed with patient on the phone.  Background:   Past Medical History:  Diagnosis Date   Diabetes mellitus without complication (HCC)    Hypertension    Stroke [facility])     Assessment: Patient Reported Symptoms:  Cognitive Cognitive Status: Alert and oriented to person, place, and time Cognitive/Intellectual Conditions Management [RPT]: None reported or documented in medical history or problem list   Health Maintenance Behaviors: Annual physical exam, Healthy diet, Sleep adequate, Stress management  Neurological Neurological Review of Symptoms: Not assessed Neurological Conditions: Stroke, ischemic Neurological Management Strategies: Routine screening Neurological Self-Management Outcome: 4 (good)  HEENT HEENT Symptoms Reported: Not assessed      Cardiovascular Cardiovascular Symptoms Reported: Not assessed    Respiratory Respiratory Symptoms Reported: Not assesed    Endocrine Patient reports the following symptoms related to hypoglycemia or hyperglycemia : Not assessed    Gastrointestinal Gastrointestinal Symptoms Reported: Not assessed      Genitourinary Genitourinary Symptoms Reported: Not assessed    Integumentary Integumentary Symptoms Reported: Not assessed    Musculoskeletal Musculoskelatal Symptoms Reviewed: No symptoms reported   Falls in the past year?: No Number of falls in past year: 1 or less Was there an injury with Fall?: No Fall Risk Category Calculator: 0 Patient Fall Risk Level: Low Fall Risk Patient at Risk for Falls Due to: No Fall Risks  Psychosocial  Psychosocial Symptoms Reviewed: Not assessed             11/04/2023   10:42 AM  Depression screen PHQ 2/9  Decreased  Interest 0  Down, Depressed, Hopeless 0  PHQ - 2 Score 0    There were no vitals filed for this visit.  Medications Reviewed Today     Reviewed by Kaylene Pascal, RN (Registered Nurse) on 11/19/23 at 1359  Med List Status: <None>   Medication Order Taking? Sig Documenting Provider Last Dose Status Informant  Accu-Chek Softclix Lancets lancets [id number] No Use to check blood sugar 3 times daily. Vernell Goldsmith, MD Taking Active Self, Pharmacy Records  amLODipine  (NORVASC ) 5 MG tablet [id number] No Take 1 tablet (5 mg total) by mouth daily. Newlin, Enobong, MD Taking Active   ammonium lactate  (AMLACTIN) 12 % cream [id number] No Apply 1 Application topically as needed for dry skin.  Patient not taking: Reported on 11/04/2023   Floyce Hutching, DPM Not Taking Active Self, Pharmacy Records  aspirin  EC 81 MG tablet [id number] No Take 1 tablet (81 mg total) by mouth daily. Newlin, Enobong, MD Taking Active   atorvastatin  (LIPITOR) 10 MG tablet [id number] No Take 1 tablet (10 mg total) by mouth daily. Newlin, Enobong, MD Taking Active   chlorthalidone  (HYGROTON ) 25 MG tablet [id number] No Take 1 tablet (25 mg total) by mouth daily. Newlin, Enobong, MD Taking Active   Continuous Glucose Sensor (FREESTYLE LIBRE 2 SENSOR) MISC [id number]  1 each by Does not apply route every 14 (fourteen) days. Newlin, Enobong, MD  Active   dapagliflozin  propanediol (FARXIGA ) 10 MG TABS tablet [id number] No Take 1 tablet (10 mg total) by mouth daily before breakfast. Newlin, Enobong, MD Taking Active   fenofibrate  micronized (LOFIBRA) 134 MG capsule [id number] No Take 1 capsule (134 mg total) by mouth daily before breakfast. Joaquin Mulberry, MD Taking  Active   gabapentin  (NEURONTIN ) 100 MG capsule [id number] No Take 1 capsule (100 mg total) by mouth 2 (two) times daily. Please mail Joaquin Mulberry, MD Taking Active   glucose chewable tablet 4 g [id number]   Vernell Goldsmith, MD  Active   ibuprofen (ADVIL) 200 MG tablet  [id number] No Take 400 mg by mouth every 6 (six) hours as needed for moderate pain (pain score 4-6). [provider] Taking Active Self, Pharmacy Records  insulin  aspart (NOVOLOG  FLEXPEN) 100 UNIT/ML FlexPen [id number] No Inject a max daily dose of 75 units  Patient taking differently: 26 Units. Take 26 units daily per Dr. Shamleffer   Newlin, Enobong, MD Taking Active   insulin  degludec (TRESIBA  FLEXTOUCH) 200 UNIT/ML FlexTouch Pen [id number] No Inject 64 Units into the skin daily in the afternoon.  Patient taking differently: Inject 60 Units into the skin daily in the afternoon.   Newlin, Enobong, MD Taking Active   lactulose , encephalopathy, (CHRONULAC ) 10 GM/15ML SOLN [id number] No Take 15 mLs (10 g total) by mouth daily as needed (constipation). Vernell Goldsmith, MD Taking Active Self, Pharmacy Records           Med Note (Sadrac Zeoli L   Thu Nov 04, 2023 10:21 AM)    lisinopril  (ZESTRIL ) 2.5 MG tablet [id number] No Take 1 tablet (2.5 mg total) by mouth daily. Newlin, Enobong, MD Taking Active   pioglitazone  (ACTOS ) 30 MG tablet [id number] No Take 1 tablet (30 mg total) by mouth daily. Newlin, Enobong, MD Taking Active             Recommendation:   PCP Follow-up  Follow Up Plan:   Telephone follow up appointment date/time:  Thursday, June 5 at 12:30 PM  Louanne Roussel RN BSN CCM [place]  [facility] Nurse Care Coordinator  Direct Dial: [phone] Website: [email or web address]

## 2023-11-20 DIAGNOSIS — Z7409 Other reduced mobility: Secondary | ICD-10-CM | POA: Diagnosis not present

## 2023-11-20 DIAGNOSIS — I6930 Unspecified sequelae of cerebral infarction: Secondary | ICD-10-CM | POA: Diagnosis not present

## 2023-11-22 ENCOUNTER — Other Ambulatory Visit (HOSPITAL_COMMUNITY): Payer: Self-pay

## 2023-11-22 ENCOUNTER — Other Ambulatory Visit: Payer: Self-pay

## 2023-11-22 DIAGNOSIS — N5201 Erectile dysfunction due to arterial insufficiency: Secondary | ICD-10-CM | POA: Diagnosis not present

## 2023-11-22 MED ORDER — SILDENAFIL CITRATE 100 MG PO TABS
50.0000 mg | ORAL_TABLET | Freq: Every day | ORAL | 5 refills | Status: DC | PRN
Start: 2023-11-22 — End: 2023-11-22
  Filled 2023-11-22: qty 30, 30d supply, fill #0

## 2023-11-22 MED ORDER — SILDENAFIL CITRATE 100 MG PO TABS
50.0000 mg | ORAL_TABLET | Freq: Every day | ORAL | 5 refills | Status: DC | PRN
  Filled 2023-11-22: qty 30, 30d supply, fill #0

## 2023-11-26 ENCOUNTER — Other Ambulatory Visit: Payer: Self-pay

## 2023-11-29 ENCOUNTER — Other Ambulatory Visit: Payer: Self-pay

## 2023-11-30 ENCOUNTER — Other Ambulatory Visit (HOSPITAL_COMMUNITY): Payer: Self-pay

## 2023-11-30 ENCOUNTER — Other Ambulatory Visit: Payer: Self-pay

## 2023-12-01 ENCOUNTER — Other Ambulatory Visit: Payer: Self-pay

## 2023-12-02 ENCOUNTER — Other Ambulatory Visit: Payer: Self-pay

## 2023-12-15 ENCOUNTER — Encounter: Payer: Self-pay | Admitting: Pediatric Intensive Care

## 2023-12-15 NOTE — Congregational Nurse Program (Signed)
  Dept: [phone]   Congregational Nurse Program Note  Date of Encounter: 12/15/2023  Past Medical History: Past Medical History:  Diagnosis Date   Diabetes mellitus without complication (HCC)    Hypertension    Stroke [facility])     Encounter Details: Brief encounter with client to get updates on DME status. Client states that he has received his DME and is doing well. Follow up in CN clinic when needed. Armand Berliner BSN RN CNNP [phone]

## 2023-12-21 ENCOUNTER — Ambulatory Visit: Attending: Nurse Practitioner | Admitting: Nurse Practitioner

## 2023-12-21 ENCOUNTER — Telehealth: Payer: Self-pay

## 2023-12-21 ENCOUNTER — Other Ambulatory Visit: Payer: Self-pay

## 2023-12-21 ENCOUNTER — Other Ambulatory Visit (HOSPITAL_COMMUNITY): Payer: Self-pay

## 2023-12-21 ENCOUNTER — Encounter: Payer: Self-pay | Admitting: Nurse Practitioner

## 2023-12-21 VITALS — BP 111/77 | HR 98 | Resp 19 | Ht 67.0 in | Wt 276.8 lb

## 2023-12-21 DIAGNOSIS — Z0000 Encounter for general adult medical examination without abnormal findings: Secondary | ICD-10-CM

## 2023-12-21 DIAGNOSIS — Z794 Long term (current) use of insulin: Secondary | ICD-10-CM

## 2023-12-21 DIAGNOSIS — E1142 Type 2 diabetes mellitus with diabetic polyneuropathy: Secondary | ICD-10-CM | POA: Diagnosis not present

## 2023-12-21 DIAGNOSIS — Z720 Tobacco use: Secondary | ICD-10-CM

## 2023-12-21 NOTE — Telephone Encounter (Signed)
 I met with the patient when he was in the clinic this afternoon.  He explained that his current power chair is damaged and [employer] told him that it may take 6 weeks to complete the repairs. In the meantime, he would like to know if they would provide a loaner.  I told him that I would call [employer] and check.  I spoke to Roger/[employer]: [phone] and he said they do not provide loaners.     I called the patient and informed him that [employer] does not offer loaners while his power chair is being repaired. He said he understood and appreciated the assistance.

## 2023-12-21 NOTE — Progress Notes (Signed)
 Assessment & Plan:   Reginald Rodriguez was seen today for annual exam.  Diagnoses and all orders for this visit:  Encounter for annual physical exam  Type 2 diabetes mellitus with diabetic polyneuropathy, with long-term current use of insulin  (HCC) -     Urine Albumin /Creatinine with ratio (send out) [LAB689] -     CMP14+EGFR  Tobacco use -     CT CHEST LUNG CA SCREEN LOW DOSE W/O CM; Future    Patient has been counseled on age-appropriate routine health concerns for screening and prevention. These are reviewed and up-to-date. Referrals have been placed accordingly. Immunizations are up-to-date or declined.    Subjective:   Chief Complaint  Patient presents with   Annual Exam    Reginald Rodriguez 73 y.o. male presents to office today for annual physical exam  Past medical history significant for CKD, COPD, previous stroke with right leg weakness at baseline, diabetes, hypertension, and AAA   He initially had made this appointment for a physical and requesting portable O2 concentrator however when we informed him that he would need to do a walk test he declined stating he did not bring his walker   Review of Systems  Constitutional:  Negative for fever, malaise/fatigue and weight loss.  HENT: Negative.  Negative for nosebleeds.   Eyes: Negative.  Negative for blurred vision, double vision and photophobia.  Respiratory: Negative.  Negative for cough and shortness of breath.   Cardiovascular: Negative.  Negative for chest pain, palpitations and leg swelling.  Gastrointestinal: Negative.  Negative for heartburn, nausea and vomiting.  Genitourinary: Negative.   Musculoskeletal: Negative.  Negative for myalgias.  Skin: Negative.   Neurological:  Positive for weakness. Negative for dizziness, focal weakness, seizures and headaches.  Endo/Heme/Allergies: Negative.   Psychiatric/Behavioral: Negative.  Negative for suicidal ideas.     Past Medical History:  Diagnosis Date   Diabetes  mellitus without complication (HCC)    Hypertension    Stroke [facility])     Past Surgical History:  Procedure Laterality Date   ABDOMINAL AORTIC ENDOVASCULAR STENT GRAFT N/A 07/18/2023   Procedure: ABDOMINAL AORTIC ENDOVASCULAR STENT GRAFT;  Surgeon: Carlene Che, MD;  Location: [facility] OR;  Service: Vascular;  Laterality: N/A;   ULTRASOUND GUIDANCE FOR VASCULAR ACCESS Bilateral 07/18/2023   Procedure: ULTRASOUND GUIDANCE FOR VASCULAR ACCESS;  Surgeon: Carlene Che, MD;  Location: [facility] OR;  Service: Vascular;  Laterality: Bilateral;    No family history on file.  Social History Reviewed with no changes to be made today.   Outpatient Medications Prior to Visit  Medication Sig Dispense Refill   Accu-Chek Softclix Lancets lancets Use to check blood sugar 3 times daily. 100 each 2   amLODipine  (NORVASC ) 5 MG tablet Take 1 tablet (5 mg total) by mouth daily. 90 tablet 3   ammonium lactate  (AMLACTIN) 12 % cream Apply 1 Application topically as needed for dry skin. 385 g 5   aspirin  EC 81 MG tablet Take 1 tablet (81 mg total) by mouth daily. 100 tablet 1   atorvastatin  (LIPITOR) 10 MG tablet Take 1 tablet (10 mg total) by mouth daily. 90 tablet 3   chlorthalidone  (HYGROTON ) 25 MG tablet Take 1 tablet (25 mg total) by mouth daily. 90 tablet 1   Continuous Glucose Sensor (FREESTYLE LIBRE 2 SENSOR) MISC 1 each by Does not apply route every 14 (fourteen) days. 2 each PRN   dapagliflozin  propanediol (FARXIGA ) 10 MG TABS tablet Take 1 tablet (10 mg total) by mouth daily  before breakfast. 90 tablet 3   fenofibrate  micronized (LOFIBRA) 134 MG capsule Take 1 capsule (134 mg total) by mouth daily before breakfast. 90 capsule 3   gabapentin  (NEURONTIN ) 100 MG capsule Take 1 capsule (100 mg total) by mouth 2 (two) times daily. 180 capsule 1   ibuprofen (ADVIL) 200 MG tablet Take 400 mg by mouth every 6 (six) hours as needed for moderate pain (pain score 4-6).     insulin  aspart (NOVOLOG  FLEXPEN) 100 UNIT/ML  FlexPen Inject a max daily dose of 75 units (Patient taking differently: 26 Units. Take 26 units daily per Dr. Rosalea Collin) 75 mL 3   insulin  degludec (TRESIBA  FLEXTOUCH) 200 UNIT/ML FlexTouch Pen Inject 64 Units into the skin daily in the afternoon. (Patient taking differently: Inject 60 Units into the skin daily in the afternoon.) 45 mL 3   lisinopril  (ZESTRIL ) 2.5 MG tablet Take 1 tablet (2.5 mg total) by mouth daily. 90 tablet 3   pioglitazone  (ACTOS ) 30 MG tablet Take 1 tablet (30 mg total) by mouth daily. 90 tablet 3   sildenafil  (VIAGRA ) 100 MG tablet Take 0.5-1 tablets (50-100 mg total) by mouth daily as needed. 30 tablet 5   lactulose , encephalopathy, (CHRONULAC ) 10 GM/15ML SOLN Take 15 mLs (10 g total) by mouth daily as needed (constipation). 480 mL 3   Facility-Administered Medications Prior to Visit  Medication Dose Route Frequency Provider Last Rate Last Admin   glucose chewable tablet 4 g  1 tablet Oral Once Vernell Goldsmith, MD        Allergies  Allergen Reactions   Bee Venom Swelling   Penicillins Rash       Objective:    BP 111/77 (BP Location: Right Arm, Patient Position: Sitting, Cuff Size: Large)   Pulse 98   Resp 19   Wt 276 lb 12.8 oz (125.6 kg)   SpO2 97%   BMI 43.35 kg/m  Wt Readings from Last 3 Encounters:  12/21/23 276 lb 12.8 oz (125.6 kg)  11/04/23 275 lb (124.7 kg)  11/02/23 275 lb 3.2 oz (124.8 kg)    Physical Exam Vitals and nursing note reviewed.  Constitutional:      Appearance: He is well-developed. He is obese.  HENT:     Head: Normocephalic and atraumatic.  Cardiovascular:     Rate and Rhythm: Normal rate and regular rhythm.     Heart sounds: Normal heart sounds. No murmur heard.    No friction rub. No gallop.  Pulmonary:     Effort: Pulmonary effort is normal. No tachypnea or respiratory distress.     Breath sounds: Normal breath sounds. No decreased breath sounds, wheezing, rhonchi or rales.  Chest:     Chest wall: No tenderness.   Abdominal:     General: Bowel sounds are normal.     Palpations: Abdomen is soft.  Musculoskeletal:        General: Normal range of motion.     Cervical back: Normal range of motion.  Skin:    General: Skin is warm and dry.  Neurological:     Mental Status: He is alert and oriented to person, place, and time.     Coordination: Coordination normal.     Gait: Gait abnormal.  Psychiatric:        Behavior: Behavior normal. Behavior is cooperative.        Thought Content: Thought content normal.        Judgment: Judgment normal.  Patient has been counseled extensively about nutrition and exercise as well as the importance of adherence with medications and regular follow-up. The patient was given clear instructions to go to ER or return to medical center if symptoms don't improve, worsen or new problems develop. The patient verbalized understanding.   Follow-up: Return in about 4 months (around 04/21/2024).   Collins Dean, FNP-BC [facility] [place], [place] [phone]   12/21/2023, 6:13 PM

## 2023-12-21 NOTE — Progress Notes (Signed)
 Patient refused to finish walk test. Stated that he does not want to do this if he has to walk around.

## 2023-12-22 ENCOUNTER — Ambulatory Visit: Payer: Self-pay | Admitting: Nurse Practitioner

## 2023-12-22 ENCOUNTER — Other Ambulatory Visit: Payer: Self-pay | Admitting: Nurse Practitioner

## 2023-12-22 DIAGNOSIS — N1831 Chronic kidney disease, stage 3a: Secondary | ICD-10-CM

## 2023-12-22 LAB — CMP14+EGFR
ALT: 19 IU/L (ref 0–44)
AST: 21 IU/L (ref 0–40)
Albumin: 4.6 g/dL (ref 3.9–4.9)
Alkaline Phosphatase: 58 IU/L (ref 44–121)
BUN/Creatinine Ratio: 15 (ref 10–24)
BUN: 26 mg/dL (ref 8–27)
Bilirubin Total: 0.3 mg/dL (ref 0.0–1.2)
CO2: 25 mmol/L (ref 20–29)
Calcium: 10.9 mg/dL — ABNORMAL HIGH (ref 8.6–10.2)
Chloride: 98 mmol/L (ref 96–106)
Creatinine, Ser: 1.7 mg/dL — ABNORMAL HIGH (ref 0.76–1.27)
Globulin, Total: 3.1 g/dL (ref 1.5–4.5)
Glucose: 72 mg/dL (ref 70–99)
Potassium: 3.9 mmol/L (ref 3.5–5.2)
Sodium: 142 mmol/L (ref 134–144)
Total Protein: 7.7 g/dL (ref 6.0–8.5)
eGFR: 43 mL/min/{1.73_m2} — ABNORMAL LOW (ref >59)

## 2023-12-22 LAB — MICROALBUMIN / CREATININE URINE RATIO
Creatinine, Urine: 158.2 mg/dL
Microalb/Creat Ratio: 5 mg/g{creat} (ref 0–29)
Microalbumin, Urine: 8.3 ug/mL

## 2023-12-23 ENCOUNTER — Telehealth: Payer: Self-pay

## 2023-12-31 ENCOUNTER — Other Ambulatory Visit: Payer: Self-pay | Admitting: Nurse Practitioner

## 2024-01-03 ENCOUNTER — Other Ambulatory Visit: Payer: Self-pay

## 2024-01-03 ENCOUNTER — Other Ambulatory Visit (HOSPITAL_COMMUNITY): Payer: Self-pay

## 2024-01-06 ENCOUNTER — Telehealth: Payer: Self-pay | Admitting: Nurse Practitioner

## 2024-01-06 NOTE — Telephone Encounter (Signed)
 Fax has been received, will fax once signed by provider.

## 2024-01-06 NOTE — Telephone Encounter (Signed)
 What is [employer] that he is requesting?

## 2024-01-06 NOTE — Telephone Encounter (Signed)
 Copied from CRM [id number]. Topic: General - Other >> Jan 06, 2024 10:07 AM Sanjuana Crutch wrote:  Reason for CRM: patient calling in because he needs his hoveramp to get around and they said they are waiting on something from his doctor and he was calling to speak with luke about this matter.

## 2024-01-06 NOTE — Telephone Encounter (Signed)
 Routing this request to his provider. I have not received any faxes. He knows me because I help with his prescriptions. However, I'm not sure I can help him with a hoveramp.

## 2024-01-07 NOTE — Telephone Encounter (Signed)
 Okay.  His PCP would need to address that when she returns.

## 2024-01-07 NOTE — Telephone Encounter (Signed)
 Noted, thank you

## 2024-01-10 ENCOUNTER — Telehealth: Payer: Self-pay

## 2024-01-10 ENCOUNTER — Other Ambulatory Visit: Payer: Self-pay

## 2024-01-10 NOTE — Telephone Encounter (Signed)
 I spoke to the patient and he has been in contact with [employer] and they told him that they sent paperwork to PCP that they need signed so they can start repairs on his [employer] scooter.

## 2024-01-10 NOTE — Telephone Encounter (Signed)
Completed Access [place] application emailed to [employer]

## 2024-01-11 NOTE — Telephone Encounter (Signed)
Noted, will inform provider.

## 2024-01-11 NOTE — Telephone Encounter (Signed)
 Paperwork signed. Will be faxed today

## 2024-01-17 ENCOUNTER — Ambulatory Visit: Admitting: Podiatry

## 2024-01-17 ENCOUNTER — Encounter (HOSPITAL_COMMUNITY): Payer: Self-pay

## 2024-01-17 ENCOUNTER — Other Ambulatory Visit: Payer: Self-pay

## 2024-01-17 ENCOUNTER — Emergency Department (HOSPITAL_COMMUNITY)

## 2024-01-17 ENCOUNTER — Emergency Department (HOSPITAL_COMMUNITY): Admission: EM | Admit: 2024-01-17 | Discharge: 2024-01-18 | Discharge status: Against Medical Advice

## 2024-01-17 DIAGNOSIS — R1011 Right upper quadrant pain: Secondary | ICD-10-CM | POA: Diagnosis not present

## 2024-01-17 DIAGNOSIS — I129 Hypertensive chronic kidney disease with stage 1 through stage 4 chronic kidney disease, or unspecified chronic kidney disease: Secondary | ICD-10-CM | POA: Diagnosis not present

## 2024-01-17 DIAGNOSIS — E1122 Type 2 diabetes mellitus with diabetic chronic kidney disease: Secondary | ICD-10-CM | POA: Diagnosis not present

## 2024-01-17 DIAGNOSIS — I7143 Infrarenal abdominal aortic aneurysm, without rupture: Secondary | ICD-10-CM | POA: Diagnosis not present

## 2024-01-17 DIAGNOSIS — N189 Chronic kidney disease, unspecified: Secondary | ICD-10-CM | POA: Insufficient documentation

## 2024-01-17 DIAGNOSIS — Z5321 Procedure and treatment not carried out due to patient leaving prior to being seen by health care provider: Secondary | ICD-10-CM | POA: Diagnosis not present

## 2024-01-17 DIAGNOSIS — R1084 Generalized abdominal pain: Secondary | ICD-10-CM | POA: Diagnosis not present

## 2024-01-17 DIAGNOSIS — R109 Unspecified abdominal pain: Secondary | ICD-10-CM | POA: Diagnosis not present

## 2024-01-17 DIAGNOSIS — R609 Edema, unspecified: Secondary | ICD-10-CM | POA: Diagnosis not present

## 2024-01-17 DIAGNOSIS — K429 Umbilical hernia without obstruction or gangrene: Secondary | ICD-10-CM | POA: Diagnosis not present

## 2024-01-17 LAB — I-STAT CHEM 8, ED
BUN: 23 mg/dL (ref 8–23)
Calcium, Ion: 1.15 mmol/L (ref 1.15–1.40)
Chloride: 104 mmol/L (ref 98–111)
Creatinine, Ser: 1.7 mg/dL — ABNORMAL HIGH (ref 0.61–1.24)
Glucose, Bld: 118 mg/dL — ABNORMAL HIGH (ref 70–99)
HCT: 47 % (ref 39.0–52.0)
Hemoglobin: 16 g/dL (ref 13.0–17.0)
Potassium: 3.7 mmol/L (ref 3.5–5.1)
Sodium: 141 mmol/L (ref 135–145)
TCO2: 26 mmol/L (ref 22–32)

## 2024-01-17 LAB — URINALYSIS, ROUTINE W REFLEX MICROSCOPIC
Bacteria, UA: NONE SEEN
Bilirubin Urine: NEGATIVE
Glucose, UA: >=500 mg/dL — AB
Hgb urine dipstick: NEGATIVE
Ketones, ur: NEGATIVE mg/dL
Leukocytes,Ua: NEGATIVE
Nitrite: NEGATIVE
Protein, ur: NEGATIVE mg/dL
Specific Gravity, Urine: 1.021 (ref 1.005–1.030)
pH: 7 (ref 5.0–8.0)

## 2024-01-17 LAB — CBC WITH DIFFERENTIAL/PLATELET
Abs Immature Granulocytes: 0.02 10*3/uL (ref 0.00–0.07)
Basophils Absolute: 0.1 10*3/uL (ref 0.0–0.1)
Basophils Relative: 1 %
Eosinophils Absolute: 0.3 10*3/uL (ref 0.0–0.5)
Eosinophils Relative: 4 %
HCT: 45.6 % (ref 39.0–52.0)
Hemoglobin: 14.9 g/dL (ref 13.0–17.0)
Immature Granulocytes: 0 %
Lymphocytes Relative: 36 %
Lymphs Abs: 2.9 10*3/uL (ref 0.7–4.0)
MCH: 29.9 pg (ref 26.0–34.0)
MCHC: 32.7 g/dL (ref 30.0–36.0)
MCV: 91.6 fL (ref 80.0–100.0)
Monocytes Absolute: 0.9 10*3/uL (ref 0.1–1.0)
Monocytes Relative: 12 %
Neutro Abs: 3.9 10*3/uL (ref 1.7–7.7)
Neutrophils Relative %: 47 %
Platelets: 295 10*3/uL (ref 150–400)
RBC: 4.98 MIL/uL (ref 4.22–5.81)
RDW: 16.5 % — ABNORMAL HIGH (ref 11.5–15.5)
WBC: 8.1 10*3/uL (ref 4.0–10.5)
nRBC: 0 % (ref 0.0–0.2)

## 2024-01-17 LAB — COMPREHENSIVE METABOLIC PANEL WITH GFR
ALT: 19 U/L (ref 0–44)
AST: 30 U/L (ref 15–41)
Albumin: 3.9 g/dL (ref 3.5–5.0)
Alkaline Phosphatase: 39 U/L (ref 38–126)
Anion gap: 11 (ref 5–15)
BUN: 20 mg/dL (ref 8–23)
CO2: 26 mmol/L (ref 22–32)
Calcium: 9.7 mg/dL (ref 8.9–10.3)
Chloride: 103 mmol/L (ref 98–111)
Creatinine, Ser: 1.71 mg/dL — ABNORMAL HIGH (ref 0.61–1.24)
GFR, Estimated: 42 mL/min — ABNORMAL LOW (ref >60)
Glucose, Bld: 118 mg/dL — ABNORMAL HIGH (ref 70–99)
Potassium: 3.8 mmol/L (ref 3.5–5.1)
Sodium: 140 mmol/L (ref 135–145)
Total Bilirubin: 0.8 mg/dL (ref 0.0–1.2)
Total Protein: 7.2 g/dL (ref 6.5–8.1)

## 2024-01-17 LAB — CBG MONITORING, ED: Glucose-Capillary: 108 mg/dL — ABNORMAL HIGH (ref 70–99)

## 2024-01-17 LAB — LIPASE, BLOOD: Lipase: 29 U/L (ref 11–51)

## 2024-01-17 MED ORDER — FENTANYL CITRATE PF 50 MCG/ML IJ SOSY
50.0000 ug | PREFILLED_SYRINGE | Freq: Once | INTRAMUSCULAR | Status: AC
  Administered 2024-01-17: 50 ug via INTRAVENOUS
  Filled 2024-01-17: qty 1

## 2024-01-17 MED ORDER — IOHEXOL 350 MG/ML SOLN
100.0000 mL | Freq: Once | INTRAVENOUS | Status: AC | PRN
  Administered 2024-01-17: 100 mL via INTRAVENOUS

## 2024-01-17 NOTE — ED Triage Notes (Signed)
 Patient BIB [facility] from home for RUQ abdominal pain starting this morning when he woke up, getting worse throughout the day, worse when he moves or lays on his L side. Patient reports his abdomen feels bigger than usual. VSS.

## 2024-01-17 NOTE — ED Provider Triage Note (Signed)
 Emergency Medicine Provider Triage Evaluation Note  Reginald Rodriguez , a 73 y.o. male  was evaluated in triage.  Pt complains of right upper quadrant abdominal pain that began suddenly this morning.  He denies any nausea/vomiting, denies any constipation or diarrhea, does endorse a previous history of ureteral stenting, further does have previous medical history of type 2 diabetes, abdominal aortic aneurysm, CKD, and primary hypertension.  He qualifies the pain as sharp stabbing pain that fluctuates and intensity, but is a persistent pain.  He denies any dysuria as well, but is concerned that abdominal pain may be related to his previous kidney disease.  Review of Systems  Positive: As noted above Negative:   Physical Exam  Ht 5' 7 (1.702 m)   Wt 124.7 kg   SpO2 94%   BMI 43.07 kg/m  Gen:   Awake, no distress   Resp:  Normal effort  MSK:   Moves extremities without difficulty  Other:  Bowel sounds are present and normal, abdomen is grossly soft and nontender with the exception of point tenderness to the right upper quadrant of the abdomen.  There is noted CVA tenderness to the right.  Medical Decision Making  Medically screening exam initiated at 3:31 PM.  Appropriate orders placed.  Reginald Rodriguez was informed that the remainder of the evaluation will be completed by another provider, this initial triage assessment does not replace that evaluation, and the importance of remaining in the ED until their evaluation is complete.  Abdominal pain order set placed.   Reginald Rodriguez, Reginald Rodriguez 01/17/24 1536

## 2024-01-17 NOTE — ED Notes (Signed)
 Pt requesting to get IV taken out. Pt advised not to leave the ER but insists on leaving. IV taken out before pt left.

## 2024-01-26 ENCOUNTER — Encounter: Payer: Self-pay | Admitting: Nurse Practitioner

## 2024-01-26 ENCOUNTER — Other Ambulatory Visit: Payer: Self-pay

## 2024-01-26 NOTE — Patient Outreach (Signed)
 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 I obtained verbal consent from Patient.  Visit completed with patient  on the phone.  Background:   Past Medical History:  Diagnosis Date   Diabetes mellitus without complication (HCC)    Hypertension    Stroke [facility])     Assessment: Patient Reported Symptoms:  Cognitive Cognitive Status: Alert and oriented to person, place, and time Cognitive/Intellectual Conditions Management [RPT]: None reported or documented in medical history or problem list   Health Maintenance Behaviors: Annual physical exam Health Facilitated by: Healthy diet, Rest  Neurological Neurological Review of Symptoms: Weakness Neurological Management Strategies: Routine screening Neurological Self-Management Outcome: 3 (uncertain)  HEENT HEENT Symptoms Reported: Not assessed      Cardiovascular Cardiovascular Symptoms Reported: No symptoms reported Does patient have uncontrolled Hypertension?: No Cardiovascular Management Strategies: Medication therapy, Routine screening Cardiovascular Self-Management Outcome: 4 (good)  Respiratory Respiratory Symptoms Reported: Shortness of breath Other Respiratory Symptoms: dyspnea on exertion Respiratory Management Strategies: Routine screening Respiratory Self-Management Outcome: 4 (good)  Endocrine Endocrine Symptoms Reported: No symptoms reported Is patient diabetic?: Yes Is patient checking blood sugars at home?: Yes List most recent blood sugar readings, include date and time of day: no FBS provided  today Endocrine Self-Management Outcome: 4 (good)  Gastrointestinal Gastrointestinal Symptoms Reported: Abdominal pain or discomfort Additional Gastrointestinal Details: no current symptoms today, on 01/17/24 patient experienced acute RUQ pain; upcoming scheduled appt for imaging on 01/28/24 Gastrointestinal Management Strategies: Diet modification (Routine Screening) Gastrointestinal Self-Management Outcome: 3 (uncertain)    Genitourinary Genitourinary Symptoms Reported: No symptoms reported    Integumentary Integumentary Symptoms Reported: No symptoms reported Skin Management Strategies: Fluid modification, Routine screening Skin Self-Management Outcome: 3 (uncertain) Skin Comment: patient was referred to Nephrology per PCP due to having stage 3b CKD  Musculoskeletal Musculoskelatal Symptoms Reviewed: Difficulty walking, Limited mobility, Unsteady gait Musculoskeletal Management Strategies: Medical device, Routine screening Musculoskeletal Self-Management Outcome: 3 (uncertain) Musculoskeletal Comment: patient is waiting for his [employer] to be repaired Falls in the past year?: Yes Number of falls in past year: 1 or less Was there an injury with Fall?: No Fall Risk Category Calculator: 1 Patient Fall Risk Level: Low Fall Risk Patient at Risk for Falls Due to: Impaired balance/gait, Impaired mobility Fall risk Follow up: Falls evaluation completed, Education provided  Psychosocial Psychosocial Symptoms Reported: No symptoms reported     Quality of Family Relationships: supportive      01/26/2024   11:44 AM  Depression screen PHQ 2/9  Decreased Interest 0  Down, Depressed, Hopeless 0  PHQ - 2 Score 0    There were no vitals filed for this visit.  Medications Reviewed Today     Reviewed by Morgan Clayborne CROME, RN (Registered Nurse) on 01/26/24 at 1142  Med List Status: <None>   Medication Order Taking? Sig Documenting Provider Last Dose Status Informant  Accu-Chek Softclix  Lancets lancets [id number]  Use to check blood sugar 3 times daily. Brien Belvie BRAVO, MD  Active Self, Pharmacy Records  amLODipine  (NORVASC ) 5 MG tablet [id number] Yes Take 1 tablet (5 mg total) by mouth daily. Newlin, Enobong, MD  Active  ammonium lactate  (AMLACTIN) 12 % cream [id number] Yes Apply 1 Application topically as needed for dry skin. Silva Juliene SAUNDERS, DPM  Active Self, Pharmacy Records  aspirin  EC 81 MG tablet [id number] Yes Take 1 tablet (81 mg total) by mouth daily. Newlin, Enobong, MD  Active   atorvastatin  (LIPITOR) 10 MG tablet [id number] Yes Take 1 tablet (10 mg total) by mouth daily. Newlin, Enobong, MD  Active   chlorthalidone  (HYGROTON ) 25 MG tablet [id number] Yes TAKE 1 TABLET EVERY DAY Newlin, Enobong, MD  Active   Continuous Glucose Sensor (FREESTYLE LIBRE 2 SENSOR) MISC [id number]  1 each by Does not apply route every 14 (fourteen) days. Newlin, Enobong, MD  Active   dapagliflozin  propanediol (FARXIGA ) 10 MG TABS tablet [id number] Yes Take 1 tablet (10 mg total) by mouth daily before breakfast. Newlin, Enobong, MD  Active   fenofibrate  micronized (LOFIBRA) 134 MG capsule [id number] Yes Take 1 capsule (134 mg total) by mouth daily before breakfast. Newlin, Enobong, MD  Active   gabapentin  (NEURONTIN ) 100 MG capsule [id number] Yes Take 1 capsule (100 mg total) by mouth 2 (two) times daily. Newlin, Enobong, MD  Active   glucose chewable tablet 4 g [id number]   Brien Belvie BRAVO, MD  Active   ibuprofen (ADVIL) 200 MG tablet [id number] Yes Take 400 mg by mouth every 6 (six) hours as needed for moderate pain (pain score 4-6). [provider]  Active Self, Pharmacy Records  insulin  aspart (NOVOLOG  FLEXPEN) 100 UNIT/ML FlexPen [id number] Yes Inject a max daily dose of 75 units Newlin, Enobong, MD  Active   insulin  degludec (TRESIBA  FLEXTOUCH) 200 UNIT/ML FlexTouch Pen [id number] Yes Inject 64 Units into the skin daily in the afternoon. Newlin, Enobong, MD  Active   lisinopril  (ZESTRIL )  2.5 MG tablet [id number] Yes Take 1 tablet (2.5 mg total) by mouth daily. Newlin, Enobong, MD  Active   pioglitazone  (ACTOS ) 30 MG tablet [id number] Yes Take 1 tablet (30 mg total) by mouth daily. Newlin, Enobong, MD  Active   sildenafil  (VIAGRA ) 100 MG tablet [id number]  Take 0.5-1 tablets (50-100 mg total) by mouth daily as needed.  Patient not taking: Reported on 01/26/2024   Watt Rush, MD  Active             Recommendation:   Specialty provider follow-up Call  [facility] to schedule your new patient appointment   Follow Up Plan:   Telephone follow-up in 1 month  Clayborne Ly RN BSN CCM [place]  [facility], [facility] Nurse Care Coordinator  Direct Dial: [phone] Website: [email or web address]

## 2024-01-27 NOTE — Patient Instructions (Signed)
 Visit Information  Thank you for taking time to visit with me today. Please don't hesitate to contact me if I can be of assistance to you before our next scheduled appointment.  Please expect a call from a care guide team member regarding your next scheduled nurse telephone visit   Please call the care guide team at [phone] if you need to cancel, schedule, or reschedule an appointment.   Please call 1-800-273-TALK (toll free, 24 hour hotline) if you are experiencing a Mental Health or Behavioral Health Crisis or need someone to talk to.  Clayborne Ly RN BSN CCM [place]  [facility], [facility] Nurse Care Coordinator  Direct Dial: [phone] Website: [email or web address]

## 2024-01-28 ENCOUNTER — Ambulatory Visit
Admission: RE | Admit: 2024-01-28 | Discharge: 2024-01-28 | Disposition: A | Discharge status: Home / Self Care | Source: Ambulatory Visit | Attending: Nurse Practitioner

## 2024-01-28 DIAGNOSIS — F17210 Nicotine dependence, cigarettes, uncomplicated: Secondary | ICD-10-CM | POA: Diagnosis not present

## 2024-01-28 DIAGNOSIS — Z720 Tobacco use: Secondary | ICD-10-CM

## 2024-01-28 DIAGNOSIS — Z122 Encounter for screening for malignant neoplasm of respiratory organs: Secondary | ICD-10-CM | POA: Diagnosis not present

## 2024-01-31 ENCOUNTER — Other Ambulatory Visit: Payer: Self-pay

## 2024-01-31 DIAGNOSIS — E785 Hyperlipidemia, unspecified: Secondary | ICD-10-CM | POA: Diagnosis not present

## 2024-01-31 DIAGNOSIS — I129 Hypertensive chronic kidney disease with stage 1 through stage 4 chronic kidney disease, or unspecified chronic kidney disease: Secondary | ICD-10-CM | POA: Diagnosis not present

## 2024-01-31 DIAGNOSIS — N1832 Chronic kidney disease, stage 3b: Secondary | ICD-10-CM | POA: Diagnosis not present

## 2024-01-31 DIAGNOSIS — E1122 Type 2 diabetes mellitus with diabetic chronic kidney disease: Secondary | ICD-10-CM | POA: Diagnosis not present

## 2024-01-31 DIAGNOSIS — F17200 Nicotine dependence, unspecified, uncomplicated: Secondary | ICD-10-CM | POA: Diagnosis not present

## 2024-02-07 ENCOUNTER — Other Ambulatory Visit: Payer: Self-pay | Admitting: Nurse Practitioner

## 2024-02-09 NOTE — Telephone Encounter (Signed)
 Message received from Courtney Rorie, GTA stating the patient has been certified for Access [place]

## 2024-02-10 ENCOUNTER — Other Ambulatory Visit: Payer: Self-pay

## 2024-02-13 ENCOUNTER — Other Ambulatory Visit: Payer: Self-pay | Admitting: Nurse Practitioner

## 2024-02-13 DIAGNOSIS — I7121 Aneurysm of the ascending aorta, without rupture: Secondary | ICD-10-CM

## 2024-02-15 DIAGNOSIS — J449 Chronic obstructive pulmonary disease, unspecified: Secondary | ICD-10-CM | POA: Diagnosis not present

## 2024-02-15 DIAGNOSIS — R600 Localized edema: Secondary | ICD-10-CM | POA: Diagnosis not present

## 2024-02-15 DIAGNOSIS — Z7409 Other reduced mobility: Secondary | ICD-10-CM | POA: Diagnosis not present

## 2024-02-15 DIAGNOSIS — I69359 Hemiplegia and hemiparesis following cerebral infarction affecting unspecified side: Secondary | ICD-10-CM | POA: Diagnosis not present

## 2024-02-15 DIAGNOSIS — I679 Cerebrovascular disease, unspecified: Secondary | ICD-10-CM | POA: Diagnosis not present

## 2024-02-21 ENCOUNTER — Other Ambulatory Visit: Payer: Self-pay

## 2024-02-21 ENCOUNTER — Telehealth: Payer: Self-pay

## 2024-02-21 NOTE — Patient Outreach (Signed)
 Care Coordination   02/21/2024 Name: Reginald Rodriguez MRN: [id number] DOB: January 11, 1951   Care Coordination Outreach Attempts:  An unsuccessful outreach was attempted for an appointment today.  Follow Up Plan:  Additional outreach attempts will be made to complete CCM follow-up visit.   Encounter Outcome:  No Answer x2 attempts. Mailbox full, unable to leave message.   Rosaline Finlay, RN MSN [place]  [facility] Population Health RN Care Manager Direct Dial: [phone]  Fax: [phone]

## 2024-02-22 ENCOUNTER — Ambulatory Visit: Attending: Nurse Practitioner

## 2024-02-22 VITALS — Ht 67.0 in | Wt 282.0 lb

## 2024-02-22 DIAGNOSIS — Z7984 Long term (current) use of oral hypoglycemic drugs: Secondary | ICD-10-CM

## 2024-02-22 DIAGNOSIS — E119 Type 2 diabetes mellitus without complications: Secondary | ICD-10-CM | POA: Diagnosis not present

## 2024-02-22 DIAGNOSIS — Z0000 Encounter for general adult medical examination without abnormal findings: Secondary | ICD-10-CM | POA: Diagnosis not present

## 2024-02-22 DIAGNOSIS — Z794 Long term (current) use of insulin: Secondary | ICD-10-CM | POA: Diagnosis not present

## 2024-02-22 NOTE — Progress Notes (Addendum)
 Because this visit was a virtual/telehealth visit,  certain criteria was not obtained, such a blood pressure, CBG if applicable, and timed get up and go. Any medications not marked as taking were not mentioned during the medication reconciliation part of the visit. Any vitals not documented were not able to be obtained due to this being a telehealth visit or patient was unable to self-report a recent blood pressure reading due to a lack of equipment at home via telehealth. Vitals that have been documented are verbally provided by the patient.   Subjective:   Reginald Rodriguez is a 73 y.o. who presents for a Medicare Wellness preventive visit.  As a reminder, Annual Wellness Visits don't include a physical exam, and some assessments may be limited, especially if this visit is performed virtually. We may recommend an in-person follow-up visit with your provider if needed.  Visit Complete: Virtual I connected with  Reginald Rodriguez on 02/22/24 by a audio enabled telemedicine application and verified that I am speaking with the correct person using two identifiers.  Patient Location: Home  Provider Location: Home Office  I discussed the limitations of evaluation and management by telemedicine. The patient expressed understanding and agreed to proceed.  Vital Signs: Because this visit was a virtual/telehealth visit, some criteria may be missing or patient reported. Any vitals not documented were not able to be obtained and vitals that have been documented are patient reported.  VideoDeclined- This patient declined Librarian, academic. Therefore the visit was completed with audio only.  Persons Participating in Visit: Patient.  AWV Questionnaire: No: Patient Medicare AWV questionnaire was not completed prior to this visit.  Cardiac Risk Factors include: advanced age (>67men, >91 women);sedentary lifestyle;diabetes mellitus;hypertension;male gender;obesity (BMI  >30kg/m2);smoking/ tobacco exposure     Objective:    Today's Vitals   02/22/24 0835  Weight: 282 lb (127.9 kg)  Height: 5' 7 (1.702 m)  PainSc: 0-No pain   Body mass index is 44.17 kg/m.     02/22/2024    8:38 AM 01/17/2024    3:34 PM 07/18/2023   11:30 PM 07/18/2023   11:09 AM 04/30/2023    2:51 PM 04/11/2023    7:04 PM 03/17/2023    3:30 PM  Advanced Directives  Does Patient Have a Medical Advance Directive? No No Yes Yes No No No  Type of Advance Directive   Healthcare Power of [employer] Power of Attorney     Does patient want to make changes to medical advance directive?   No - Patient declined      Would patient like information on creating a medical advance directive? No - Patient declined      No - Patient declined    Current Medications (verified) Outpatient Encounter Medications as of 02/22/2024  Medication Sig   Accu-Chek Softclix Lancets lancets Use to check blood sugar 3 times daily.   amLODipine  (NORVASC ) 5 MG tablet Take 1 tablet (5 mg total) by mouth daily.   ammonium lactate  (AMLACTIN) 12 % cream Apply 1 Application topically as needed for dry skin.   aspirin  EC 81 MG tablet Take 1 tablet (81 mg total) by mouth daily.   atorvastatin  (LIPITOR) 10 MG tablet Take 1 tablet (10 mg total) by mouth daily.   chlorthalidone  (HYGROTON ) 25 MG tablet TAKE 1 TABLET EVERY DAY   Continuous Glucose Sensor (FREESTYLE LIBRE 2 SENSOR) MISC 1 each by Does not apply route every 14 (fourteen) days.   dapagliflozin  propanediol (FARXIGA ) 10 MG TABS  tablet Take 1 tablet (10 mg total) by mouth daily before breakfast.   fenofibrate  micronized (LOFIBRA) 134 MG capsule Take 1 capsule (134 mg total) by mouth daily before breakfast.   gabapentin  (NEURONTIN ) 100 MG capsule Take 1 capsule (100 mg total) by mouth 2 (two) times daily.   ibuprofen (ADVIL) 200 MG tablet Take 400 mg by mouth every 6 (six) hours as needed for moderate pain (pain score 4-6).   insulin  aspart (NOVOLOG   FLEXPEN) 100 UNIT/ML FlexPen Inject a max daily dose of 75 units   insulin  degludec (TRESIBA  FLEXTOUCH) 200 UNIT/ML FlexTouch Pen Inject 64 Units into the skin daily in the afternoon.   lisinopril  (ZESTRIL ) 2.5 MG tablet Take 1 tablet (2.5 mg total) by mouth daily.   pioglitazone  (ACTOS ) 30 MG tablet Take 1 tablet (30 mg total) by mouth daily.   sildenafil  (VIAGRA ) 100 MG tablet Take 0.5-1 tablets (50-100 mg total) by mouth daily as needed. (Patient not taking: Reported on 01/26/2024)   Facility-Administered Encounter Medications as of 02/22/2024  Medication   glucose chewable tablet 4 g    Allergies (verified) Bee venom and Penicillins   History: Past Medical History:  Diagnosis Date   Diabetes mellitus without complication (HCC)    Hypertension    Stroke [facility])    Past Surgical History:  Procedure Laterality Date   ABDOMINAL AORTIC ENDOVASCULAR STENT GRAFT N/A 07/18/2023   Procedure: ABDOMINAL AORTIC ENDOVASCULAR STENT GRAFT;  Surgeon: Magda Debby SAILOR, MD;  Location: [facility] OR;  Service: Vascular;  Laterality: N/A;   ULTRASOUND GUIDANCE FOR VASCULAR ACCESS Bilateral 07/18/2023   Procedure: ULTRASOUND GUIDANCE FOR VASCULAR ACCESS;  Surgeon: Magda Debby SAILOR, MD;  Location: [facility] OR;  Service: Vascular;  Laterality: Bilateral;   History reviewed. No pertinent family history. Social History   Socioeconomic History   Marital status: Single    Spouse name: Not on file   Number of children: Not on file   Years of education: Not on file   Highest education level: Not on file  Occupational History   Not on file  Tobacco Use   Smoking status: Every Day    Current packs/day: 1.00    Average packs/day: 1 pack/day for 46.0 years (46.0 ttl pk-yrs)    Types: Cigarettes   Smokeless tobacco: Former  Building services engineer status: Never Used  Substance and Sexual Activity   Alcohol use: Not Currently   Drug use: Not Currently   Sexual activity: Not on file  Other Topics Concern   Not on file   Social History Narrative   Not on file   Social Drivers of Health   Financial Resource Strain: Medium Risk (02/22/2024)   Overall Financial Resource Strain (CARDIA)    Difficulty of Paying Living Expenses: Somewhat hard  Food Insecurity: No Food Insecurity (02/22/2024)   Hunger Vital Sign    Worried About Running Out of Food in the Last Year: Never true    Ran Out of Food in the Last Year: Never true  Transportation Needs: No Transportation Needs (02/22/2024)   PRAPARE - Administrator, Civil Service (Medical): No    Lack of Transportation (Non-Medical): No  Physical Activity: Inactive (02/22/2024)   Exercise Vital Sign    Days of Exercise per Week: 0 days    Minutes of Exercise per Session: 0 min  Stress: No Stress Concern Present (02/22/2024)   [employer] of Occupational Health - Occupational Stress Questionnaire    Feeling of Stress: Not at all  Social Connections: Unknown (02/22/2024)   Social Connection and Isolation Panel    Frequency of Communication with Friends and Family: More than three times a week    Frequency of Social Gatherings with Friends and Family: Once a week    Attends Religious Services: 1 to 4 times per year    Active Member of [employer] or Organizations: No    Attends Engineer, structural: Patient declined    Marital Status: Patient declined    Tobacco Counseling Ready to quit: Not Answered Counseling given: Not Answered    Clinical Intake:  Pre-visit preparation completed: Yes  Pain : No/denies pain Pain Score: 0-No pain     BMI - recorded: 44.17 Nutritional Status: BMI > 30  Obese Nutritional Risks: None Diabetes: Yes CBG done?: No Did pt. bring in CBG monitor from home?: No  Lab Results  Component Value Date   HGBA1C 6.6 (A) 11/02/2023   HGBA1C 6.8 (A) 05/04/2023   HGBA1C 7.5 (A) 03/04/2023     How often do you need to have someone help you when you read instructions, pamphlets, or other written materials from your  doctor or pharmacy?: 1 - Never  Interpreter Needed?: No  Information entered by :: Nathalee Smarr N. Taylah Dubiel, LPN.   Activities of Daily Living     02/22/2024    8:43 AM 07/18/2023   11:30 PM  In your present state of health, do you have any difficulty performing the following activities:  Hearing? 0 0  Vision? 0 0  Difficulty concentrating or making decisions? 0 0  Walking or climbing stairs? 1   Dressing or bathing? 1   Doing errands, shopping? 0 0  Preparing Food and eating ? Y   Using the Toilet? N   In the past six months, have you accidently leaked urine? N   Do you have problems with loss of bowel control? N   Managing your Medications? N   Managing your Finances? N   Housekeeping or managing your Housekeeping? Y     Patient Care Team: Theotis Haze ORN, NP as PCP - General (Nurse Practitioner) Arno Rosaline SQUIBB, RN as [facility] Management Watt Rush, MD as Attending Physician (Urology) Robinson Mayo, OD as Referring Physician (Optometry)  I have updated your Care Teams any recent Medical Services you may have received from other providers in the past year.     Assessment:   This is a routine wellness examination for Khaden.  Hearing/Vision screen Hearing Screening - Comments:: Denies hearing difficulties.  Vision Screening - Comments:: Wears rx glasses - up to date with routine eye exams with Mayo Robinson, OD.    Goals Addressed             This Visit's Progress    Client understands the importance of follow-up with providers by attending scheduled visits         Depression Screen     02/22/2024    8:40 AM 01/26/2024   11:44 AM 11/04/2023   10:42 AM 01/14/2023    8:55 AM 10/22/2022   10:33 AM 09/17/2022    8:28 AM 06/23/2022    9:35 AM  PHQ 2/9 Scores  PHQ - 2 Score 0 0 0   0 0  PHQ- 9 Score 0        Exception Documentation    Patient refusal Patient refusal      Fall Risk     02/22/2024    8:40 AM 01/26/2024   11:42 AM 12/21/2023  3:03 PM 11/19/2023     2:01 PM 11/04/2023   10:40 AM  Fall Risk   Falls in the past year? 0 1 1 0 0  Number falls in past yr: 0 0 0 0 0  Injury with Fall? 0 0 0 0 0  Risk for fall due to : No Fall Risks Impaired balance/gait;Impaired mobility Impaired balance/gait;Impaired mobility No Fall Risks No Fall Risks  Follow up Falls evaluation completed Falls evaluation completed;Education provided Falls evaluation completed      MEDICARE RISK AT HOME:  Medicare Risk at Home Any stairs in or around the home?: No If so, are there any without handrails?: No Home free of loose throw rugs in walkways, pet beds, electrical cords, etc?: Yes Adequate lighting in your home to reduce risk of falls?: Yes Life alert?: No Use of a cane, walker or w/c?: Yes (WALKER) Grab bars in the bathroom?: Yes Shower chair or bench in shower?: Yes Elevated toilet seat or a handicapped toilet?: Yes  TIMED UP AND GO:  Was the test performed?  No  Cognitive Function: Declined/Normal: No cognitive concerns noted by patient or family. Patient alert, oriented, able to answer questions appropriately and recall recent events. No signs of memory loss or confusion.    02/22/2024    8:43 AM  MMSE - Mini Mental State Exam  Not completed: Unable to complete        02/22/2024    8:43 AM 09/17/2022    8:31 AM 03/22/2021   11:49 AM  6CIT Screen  What Year? 0 points 0 points 0 points  What month? 0 points 0 points 0 points  What time? 0 points 0 points 0 points  Count back from 20 0 points 0 points 0 points  Months in reverse 0 points 0 points 0 points  Repeat phrase 0 points 0 points   Total Score 0 points 0 points     Immunizations Immunization History  Administered Date(s) Administered   Fluad Quad(high Dose 65+) 04/02/2022   Fluad Trivalent(High Dose 65+) 04/28/2023   Influenza,inj,Quad PF,6+ Mos 04/15/2020, 03/19/2021   PFIZER Comirnaty(Gray Top)Covid-19 Tri-Sucrose Vaccine 09/11/2020, 01/22/2021   PFIZER(Purple Top)SARS-COV-2  Vaccination 11/09/2019, 12/07/2019   Pneumococcal Conjugate-13 04/15/2020   Pneumococcal Polysaccharide-23 06/16/2021   Respiratory Syncytial Virus Vaccine ,Recomb Aduvanted(Arexvy ) 04/02/2022   Tdap 04/15/2017   Zoster Recombinant(Shingrix ) 04/15/2022    Screening Tests Health Maintenance  Topic Date Due   Zoster Vaccines- Shingrix  (2 of 2) 06/10/2022   COVID-19 Vaccine (5 - 2024-25 season) 03/21/2023   FOOT EXAM  01/11/2024   OPHTHALMOLOGY EXAM  02/09/2024   INFLUENZA VACCINE  02/18/2024   HEMOGLOBIN A1C  05/03/2024   Diabetic kidney evaluation - Urine ACR  12/20/2024   Diabetic kidney evaluation - eGFR measurement  01/16/2025   Lung Cancer Screening  01/27/2025   Medicare Annual Wellness (AWV)  02/21/2025   DTaP/Tdap/Td (2 - Td or Tdap) 04/16/2027   Colonoscopy  07/24/2029   Pneumococcal Vaccine: 50+ Years  Completed   Hepatitis C Screening  Completed   Hepatitis B Vaccines  Aged Out   HPV VACCINES  Aged Out   Meningococcal B Vaccine  Aged Out    Health Maintenance  Health Maintenance Due  Topic Date Due   Zoster Vaccines- Shingrix  (2 of 2) 06/10/2022   COVID-19 Vaccine (5 - 2024-25 season) 03/21/2023   FOOT EXAM  01/11/2024   OPHTHALMOLOGY EXAM  02/09/2024   INFLUENZA VACCINE  02/18/2024   Health Maintenance Items Addressed: Yes, Patient  aware of current care gaps.  Diabetic Foot Exam recommended and referral sent for diabetic eye exam.  Additional Screening:  Vision Screening: Recommended annual ophthalmology exams for early detection of glaucoma and other disorders of the eye. Would you like a referral to an eye doctor? Yes    Dental Screening: Recommended annual dental exams for proper oral hygiene  Community Resource Referral / Chronic Care Management: CRR required this visit?  No   CCM required this visit?  No   Plan:    I have personally reviewed and noted the following in the patient's chart:   Medical and social history Use of alcohol, tobacco  or illicit drugs  Current medications and supplements including opioid prescriptions. Patient is not currently taking opioid prescriptions. Functional ability and status Nutritional status Physical activity Advanced directives List of other physicians Hospitalizations, surgeries, and ER visits in previous 12 months Vitals Screenings to include cognitive, depression, and falls Referrals and appointments  In addition, I have reviewed and discussed with patient certain preventive protocols, quality metrics, and best practice recommendations. A written personalized care plan for preventive services as well as general preventive health recommendations were provided to patient.   Roz LOISE Fuller, LPN   07/23/7972   After Visit Summary: (Mail) Due to this being a telephonic visit, the after visit summary with patients personalized plan was offered to patient via mail   Notes: Diabetic Foot Exam recommended and referral sent for diabetic eye exam.

## 2024-02-22 NOTE — Patient Instructions (Addendum)
 [name] , Thank you for taking time out of your busy schedule to complete your Annual Wellness Visit with me. I enjoyed our conversation and look forward to speaking with you again next year. I, as well as your care team,  appreciate your ongoing commitment to your health goals. Please review the following plan we discussed and let me know if I can assist you in the future. Your Game plan/ To Do List    Referrals: If you haven't heard from the office you've been referred to, please reach out to them at the phone provided.   Follow up Visits: We will see or speak with you next year for your Next Medicare AWV with our clinical staff Have you seen your provider in the last 6 months (3 months if uncontrolled diabetes)? Yes  Clinician Recommendations:  Aim for 30 minutes of exercise or brisk walking, 6-8 glasses of water, and 5 servings of fruits and vegetables each day.       This is a list of the screenings recommended for you:  Health Maintenance  Topic Date Due   Zoster (Shingles) Vaccine (2 of 2) 06/10/2022   COVID-19 Vaccine (5 - 2024-25 season) 03/21/2023   Complete foot exam   01/11/2024   Eye exam for diabetics  02/09/2024   Flu Shot  02/18/2024   Hemoglobin A1C  05/03/2024   Yearly kidney health urinalysis for diabetes  12/20/2024   Yearly kidney function blood test for diabetes  01/16/2025   Screening for Lung Cancer  01/27/2025   Medicare Annual Wellness Visit  02/21/2025   DTaP/Tdap/Td vaccine (2 - Td or Tdap) 04/16/2027   Colon Cancer Screening  07/24/2029   Pneumococcal Vaccine for age over 69  Completed   Hepatitis C Screening  Completed   Hepatitis B Vaccine  Aged Out   HPV Vaccine  Aged Out   Meningitis B Vaccine  Aged Out    Advanced directives: (Declined) Advance directive discussed with you today. Even though you declined this today, please call our office should you change your mind, and we can give you the proper paperwork for you to fill out. Advance Care  Planning is important because it:  [x]  Makes sure you receive the medical care that is consistent with your values, goals, and preferences  [x]  It provides guidance to your family and loved ones and reduces their decisional burden about whether or not they are making the right decisions based on your wishes.  Follow the link provided in your after visit summary or read over the paperwork we have mailed to you to help you started getting your Advance Directives in place. If you need assistance in completing these, please reach out to us  so that we can help you!  See attachments for Preventive Care and Fall Prevention Tips.

## 2024-02-28 ENCOUNTER — Other Ambulatory Visit: Payer: Self-pay

## 2024-02-28 NOTE — Patient Outreach (Signed)
 Complex Care Management   Visit Note  02/28/2024  Name:  Reginald Rodriguez MRN: [id number] DOB: 18-Apr-1951  Situation: Referral received for Complex Care Management related to hospitalization for AAA repair I obtained verbal consent from Patient.  Visit completed with Octave Koop  on the phone  Background:   Past Medical History:  Diagnosis Date   Diabetes mellitus without complication (HCC)    Hypertension    Stroke [facility])     Assessment: Patient Reported Symptoms:  Cognitive Cognitive Status: Able to follow simple commands, Alert and oriented to person, place, and time, Normal speech and language skills Cognitive/Intellectual Conditions Management [RPT]: None reported or documented in medical history or problem list      Neurological Neurological Review of Symptoms: Weakness (Right sided sinces troke (2021)) Neurological Management Strategies: Routine screening, Medication therapy  HEENT HEENT Symptoms Reported: No symptoms reported HEENT Management Strategies: Routine screening, Medical device HEENT Comment: 3 teeth right side on bottom, does not wear lower dentures and has discussed this with dental provider. He does wear upper dentures    Cardiovascular Cardiovascular Symptoms Reported: No symptoms reported Does patient have uncontrolled Hypertension?: No Cardiovascular Management Strategies: Medical device, Routine screening  Respiratory Respiratory Symptoms Reported: No symptoms reported Additional Respiratory Details: Patient reports he continues to smoke about 7-8 cigarettes a day Respiratory Management Strategies: Routine screening  Endocrine Endocrine Symptoms Reported: No symptoms reported Is patient diabetic?: Yes Is patient checking blood sugars at home?: Yes List most recent blood sugar readings, include date and time of day: FreeStyle Libre it's been great    Gastrointestinal Gastrointestinal Symptoms Reported: No symptoms reported Additional Gastrointestinal  Details: Patient reports a good appetite. He reports regular BMs. Patient reports that RUQ pain has gone away.      Genitourinary Genitourinary Symptoms Reported: No symptoms reported Genitourinary Comment: Patient reports he has been to nephrology (referred by PCP).  Integumentary Integumentary Symptoms Reported: No symptoms reported    Musculoskeletal Musculoskelatal Symptoms Reviewed: Difficulty walking, Limited mobility, Unsteady gait Additional Musculoskeletal Details: Right sided hemiparesis Musculoskeletal Management Strategies: Medical device, Routine screening Musculoskeletal Comment: Patient reports he is still waiting for replacement pieces for his Hover[employer] to be delivered. His did get some parts delivered yesterday, but is waiting on others. He has been told [employer] sends someone out to fix it once parts are delivered. Patient is using walker to get around his apartment. Falls in the past year?: No Number of falls in past year: 1 or less Was there an injury with Fall?: No Fall Risk Category Calculator: 0 Patient Fall Risk Level: Low Fall Risk Patient at Risk for Falls Due to: No Fall Risks Fall risk Follow up: Falls evaluation completed, Education provided, Falls prevention discussed  Psychosocial Psychosocial Symptoms Reported: No symptoms reported            02/22/2024    8:40 AM  Depression screen PHQ 2/9  Decreased Interest 0  Down, Depressed, Hopeless 0  PHQ - 2 Score 0  Altered sleeping 0  Tired, decreased energy 0  Change in appetite 0  Feeling bad or failure about yourself  0  Trouble concentrating 0  Moving slowly or fidgety/restless 0  Suicidal thoughts 0  PHQ-9 Score 0  Difficult doing work/chores Not difficult at all    There were no vitals filed for this visit.  Medications Reviewed Today     Reviewed by Arno Rosaline SQUIBB, RN (Registered Nurse) on 02/28/24 at [phone]  Med List Status: <None>   Medication Order  Taking? Sig Documenting Provider  Last Dose Status Informant  Accu-Chek Softclix Lancets lancets [id number] Yes Use to check blood sugar 3 times daily. Brien Belvie BRAVO, MD  Active Self, Pharmacy Records  amLODipine  (NORVASC ) 5 MG tablet [id number] Yes Take 1 tablet (5 mg total) by mouth daily. Newlin, Enobong, MD  Active   ammonium lactate  (AMLACTIN) 12 % cream [id number] Yes Apply 1 Application topically as needed for dry skin. Silva Juliene SAUNDERS, DPM  Active Self, Pharmacy Records  aspirin  EC 81 MG tablet [id number] Yes Take 1 tablet (81 mg total) by mouth daily. Newlin, Enobong, MD  Active   atorvastatin  (LIPITOR) 10 MG tablet [id number] Yes Take 1 tablet (10 mg total) by mouth daily. Newlin, Enobong, MD  Active   chlorthalidone  (HYGROTON ) 25 MG tablet [id number] Yes TAKE 1 TABLET EVERY DAY Newlin, Enobong, MD  Active   Continuous Glucose Sensor (FREESTYLE LIBRE 2 SENSOR) MISC [id number] Yes 1 each by Does not apply route every 14 (fourteen) days. Newlin, Enobong, MD  Active   dapagliflozin  propanediol (FARXIGA ) 10 MG TABS tablet [id number] Yes Take 1 tablet (10 mg total) by mouth daily before breakfast. Newlin, Enobong, MD  Active   fenofibrate  micronized (LOFIBRA) 134 MG capsule [id number] Yes Take 1 capsule (134 mg total) by mouth daily before breakfast. Newlin, Enobong, MD  Active   gabapentin  (NEURONTIN ) 100 MG capsule [id number] Yes Take 1 capsule (100 mg total) by mouth 2 (two) times daily. Newlin, Enobong, MD  Active   glucose chewable tablet 4 g [id number]   Brien Belvie BRAVO, MD  Active   ibuprofen (ADVIL) 200 MG tablet [id number]  Take 400 mg by mouth every 6 (six) hours as needed for moderate pain (pain score 4-6).  Patient not taking: Reported on 02/28/2024   [provider]  Active Self, Pharmacy Records  insulin  aspart (NOVOLOG  FLEXPEN) 100 UNIT/ML FlexPen [id number] Yes Inject a max daily dose of 75 units Newlin, Enobong, MD  Active   insulin  degludec (TRESIBA  FLEXTOUCH) 200 UNIT/ML FlexTouch Pen [id number] Yes Inject 64  Units into the skin daily in the afternoon.  Patient taking differently: Inject 60 Units into the skin daily in the afternoon.   Newlin, Enobong, MD  Active   lisinopril  (ZESTRIL ) 2.5 MG tablet [id number] Yes Take 1 tablet (2.5 mg total) by mouth daily. Newlin, Enobong, MD  Active   pioglitazone  (ACTOS ) 30 MG tablet [id number] Yes Take 1 tablet (30 mg total) by mouth daily. Newlin, Enobong, MD  Active   sildenafil  (VIAGRA ) 100 MG tablet [id number]  Take 0.5-1 tablets (50-100 mg total) by mouth daily as needed.  Patient not taking: Reported on 02/28/2024   Wrenn, John, MD  Active             Recommendation:   Continue Current Plan of Care  Follow Up Plan:   Telephone follow up appointment date/time:  03/27/24 at 9 AM  Rosaline Finlay, RN MSN [place]  [facility] RN Care Manager Direct Dial: [phone]  Fax: [phone]

## 2024-02-28 NOTE — Patient Instructions (Signed)
 Visit Information  Thank you for taking time to visit with me today. Please don't hesitate to contact me if I can be of assistance to you before our next scheduled appointment.  Your next care management appointment is by telephone on 03/27/24 at 9 AM  Please call the care guide team at [phone] if you need to cancel, schedule, or reschedule an appointment.   Please call the Suicide and Crisis Lifeline: 988 call 1-800-273-TALK (toll free, 24 hour hotline) if you are experiencing a Mental Health or Behavioral Health Crisis or need someone to talk to.  Rosaline Finlay, RN MSN [place]  [facility] Population [facility] RN Care Manager Direct Dial: [phone]  Fax: [phone]

## 2024-02-29 NOTE — Progress Notes (Deleted)
 [address] [place] [address]             [phone]            Declin Rajan [id number] 1951/04/02   History of Present Illness:  Mr. Dayvon Hockenberry is a 73 year old male with medical history of hypertension, abdominal aortic aneurysm, COPD with emphysema, OSA, fatty liver, type 2 diabetes, arthritis, chronic kidney disease stage 3b, and tobacco use presents for initial encounter for ascending thoracic aortic aneurysm.  This was found incidentally on low dose CT scan for lung cancer screening.  CT on 01/28/2024 measured aneurysm at 4.0 cm.  Blood pressure*** Exercise*** Symptoms***    Current Outpatient Medications on File Prior to Visit  Medication Sig Dispense Refill   Accu-Chek Softclix Lancets lancets Use to check blood sugar 3 times daily. 100 each 2   amLODipine  (NORVASC ) 5 MG tablet Take 1 tablet (5 mg total) by mouth daily. 90 tablet 3   ammonium lactate  (AMLACTIN) 12 % cream Apply 1 Application topically as needed for dry skin. 385 g 5   aspirin  EC 81 MG tablet Take 1 tablet (81 mg total) by mouth daily. 100 tablet 1   atorvastatin  (LIPITOR) 10 MG tablet Take 1 tablet (10 mg total) by mouth daily. 90 tablet 3   chlorthalidone  (HYGROTON ) 25 MG tablet TAKE 1 TABLET EVERY DAY 90 tablet 2   Continuous Glucose Sensor (FREESTYLE LIBRE 2 SENSOR) MISC 1 each by Does not apply route every 14 (fourteen) days. 2 each PRN   dapagliflozin  propanediol (FARXIGA ) 10 MG TABS tablet Take 1 tablet (10 mg total) by mouth daily before breakfast. 90 tablet 3   fenofibrate  micronized (LOFIBRA) 134 MG capsule Take 1 capsule (134 mg total) by mouth daily before breakfast. 90 capsule 3   gabapentin  (NEURONTIN ) 100 MG capsule Take 1 capsule (100 mg total) by mouth 2 (two) times daily. 180 capsule 1   ibuprofen (ADVIL) 200 MG tablet Take 400 mg by mouth every 6 (six) hours as needed for moderate pain (pain score 4-6). (Patient not taking: Reported on 02/28/2024)      insulin  aspart (NOVOLOG  FLEXPEN) 100 UNIT/ML FlexPen Inject a max daily dose of 75 units 75 mL 3   insulin  degludec (TRESIBA  FLEXTOUCH) 200 UNIT/ML FlexTouch Pen Inject 64 Units into the skin daily in the afternoon. (Patient taking differently: Inject 60 Units into the skin daily in the afternoon.) 45 mL 3   lisinopril  (ZESTRIL ) 2.5 MG tablet Take 1 tablet (2.5 mg total) by mouth daily. 90 tablet 3   pioglitazone  (ACTOS ) 30 MG tablet Take 1 tablet (30 mg total) by mouth daily. 90 tablet 3   sildenafil  (VIAGRA ) 100 MG tablet Take 0.5-1 tablets (50-100 mg total) by mouth daily as needed. (Patient not taking: Reported on 02/28/2024) 30 tablet 5   Current Facility-Administered Medications on File Prior to Visit  Medication Dose Route Frequency Provider Last Rate Last Admin   glucose chewable tablet 4 g  1 tablet Oral Once Wright, Patrick E, MD         ROS:   There were no vitals taken for this visit.    Imaging: Narrative & Impression  CLINICAL DATA:  Current 49 pack-year smoker.   EXAM: CT CHEST WITHOUT CONTRAST LOW-DOSE FOR LUNG CANCER SCREENING   TECHNIQUE: Multidetector CT imaging of the chest was performed following the standard protocol without IV contrast.   RADIATION DOSE REDUCTION:  This exam was performed according to the departmental dose-optimization program which includes automated exposure control, adjustment of the mA and/or kV according to patient size and/or use of iterative reconstruction technique.   COMPARISON:  05/29/2022.   FINDINGS: Cardiovascular: Atherosclerotic calcification of the aorta and coronary arteries. Heart is at the upper limits of normal in size. No pericardial effusion. Ascending aorta measures 4.0 cm (coronal image 157), stable.   Mediastinum/Nodes: No pathologically enlarged mediastinal or axillary lymph nodes. Hilar regions are difficult to definitively evaluate without IV contrast. Esophagus is grossly unremarkable.   Lungs/Pleura:  Centrilobular and paraseptal emphysema. 2.1 mm left upper lobe nodule. No new pulmonary nodules. No pleural fluid. Airway is unremarkable.   Upper Abdomen: Liver is decreased in attenuation diffusely. Small bilateral adrenal adenomas. No specific follow-up necessary. Visualized portions of the liver, gallbladder, adrenal glands, kidneys, spleen, pancreas, stomach and bowel are otherwise grossly unremarkable. No upper abdominal adenopathy.   Musculoskeletal: Degenerative changes in the spine.   IMPRESSION: 1. Lung-RADS 2, benign appearance or behavior. Continue annual screening with low-dose chest CT without contrast in 12 months. 2. 4.0 cm ascending aortic aneurysm, stable. Recommend annual imaging followup by CTA or MRA. This recommendation follows 2010 ACCF/AHA/AATS/ACR/ASA/SCA/SCAI/SIR/STS/SVM Guidelines for the Diagnosis and Management of Patients with Thoracic Aortic Disease. Circulation. 2010; 121: Z733-z630. Aortic aneurysm NOS (ICD10-I71.9). 3. Hepatic steatosis. 4. Bilateral adrenal adenomas. 5. Aortic atherosclerosis (ICD10-I70.0). Coronary artery calcification. 6.  Emphysema (ICD10-J43.9).     Electronically Signed   By: Newell Eke M.D.   On: 02/07/2024 10:34       A/P: Aneurysm of ascending aorta without rupture (HCC) -4.0 cm ascending thoracic aortic aneurysm on low dose CT scan of chest. We discussed the natural history and and risk factors for growth of ascending aortic aneurysms. Discussed recommendations to minimize the risk of further expansion or dissection including careful blood pressure control, avoidance of contact sports and heavy lifting, attention to lipid management.  We covered the importance of smoking ***cessation/staying never user.  The patient does not yet meet surgical criteria of >5.5cm. The patient is aware of signs and symptoms of aortic dissection and when to present to the emergency department   -Follow up in one year with CT of  chest, can use low dose CT scan for lung cancer screening  Risk Modification:  Statin:  atorvastatin  10 mg  Smoking cessation instruction/counseling given:  {CHL AMB [facility] SMOKING CESSATION COUNSELING:20758}  Patient was counseled on importance of Blood Pressure Control  They are instructed to contact their Primary Care Physician if they start to have blood pressure readings over 130s/90s. Do not ever stop blood pressure medications on your own, unless instructed by healthcare professional.  Please avoid use of Fluoroquinolones as this can potentially increase your risk of Aortic Rupture and/or Dissection  Patient educated on signs and symptoms of Aortic Dissection, handout also provided in AVS  Manuelita CHRISTELLA Rough, PA-C 02/29/24

## 2024-03-01 ENCOUNTER — Ambulatory Visit

## 2024-03-01 DIAGNOSIS — I7121 Aneurysm of the ascending aorta, without rupture: Secondary | ICD-10-CM

## 2024-03-07 ENCOUNTER — Other Ambulatory Visit (HOSPITAL_COMMUNITY): Payer: Self-pay

## 2024-03-07 ENCOUNTER — Other Ambulatory Visit: Payer: Self-pay

## 2024-03-07 DIAGNOSIS — I69359 Hemiplegia and hemiparesis following cerebral infarction affecting unspecified side: Secondary | ICD-10-CM | POA: Diagnosis not present

## 2024-03-07 DIAGNOSIS — Z7409 Other reduced mobility: Secondary | ICD-10-CM | POA: Diagnosis not present

## 2024-03-07 DIAGNOSIS — I679 Cerebrovascular disease, unspecified: Secondary | ICD-10-CM | POA: Diagnosis not present

## 2024-03-07 DIAGNOSIS — R600 Localized edema: Secondary | ICD-10-CM | POA: Diagnosis not present

## 2024-03-07 DIAGNOSIS — J449 Chronic obstructive pulmonary disease, unspecified: Secondary | ICD-10-CM | POA: Diagnosis not present

## 2024-03-08 ENCOUNTER — Other Ambulatory Visit: Payer: Self-pay

## 2024-03-27 ENCOUNTER — Telehealth: Payer: Self-pay | Admitting: Nurse Practitioner

## 2024-03-27 ENCOUNTER — Other Ambulatory Visit: Payer: Self-pay

## 2024-03-27 NOTE — Patient Instructions (Signed)
 Visit Information  Thank you for taking time to visit with me today. Please don't hesitate to contact me if I can be of assistance to you before our next scheduled appointment.  Your next care management appointment is by telephone on 04/24/24 at 9 AM  I have sent a message to your PCP, Haze Servant, asking for clarification on referral to vascular surgery. I will keep you updated when I hear back from her.  Please call the care guide team at [phone] if you need to cancel, schedule, or reschedule an appointment.   Please call the Suicide and Crisis Lifeline: 988 call 1-800-273-TALK (toll free, 24 hour hotline) if you are experiencing a Mental Health or Behavioral Health Crisis or need someone to talk to.  Rosaline Finlay, RN MSN [place]  [facility] Population Health RN Care Manager Direct Dial: [phone]  Fax: [phone]

## 2024-03-27 NOTE — Telephone Encounter (Signed)
Pt confirmed appt 9/9

## 2024-03-27 NOTE — Patient Outreach (Signed)
 Complex Care Management   Visit Note  03/27/2024  Name:  Reginald Rodriguez MRN: [id number] DOB: [birth year]  Situation: Referral received for Complex Care Management related to s/p EVAR I obtained verbal consent from Patient.  Visit completed with Patient  on the phone  Background:   Past Medical History:  Diagnosis Date   Diabetes mellitus without complication (HCC)    Hypertension    Stroke [facility])     Assessment: Patient Reported Symptoms:  Cognitive Cognitive Status: Able to follow simple commands, Alert and oriented to person, place, and time, Normal speech and language skills Cognitive/Intellectual Conditions Management [RPT]: None reported or documented in medical history or problem list      Neurological Neurological Review of Symptoms: Weakness (right sided due to stroke in 2021) Neurological Management Strategies: Medication therapy, Routine screening  HEENT HEENT Symptoms Reported: No symptoms reported      Cardiovascular Cardiovascular Symptoms Reported: No symptoms reported Does patient have uncontrolled Hypertension?: No Cardiovascular Management Strategies: Medical device, Routine screening Cardiovascular Comment: Note per chart review that patient missed appointment with cardiothoracic surgeon 03/01/24. Patient reports he is unsure why this referral was placed/reason for appointment. He reports he was told at his last visit with vascular surgeron (February 2025) that he did not need to follow-up for another year following EVAR. Message sent to PCP, who placed referrall, asking for clarification.  Respiratory Respiratory Symptoms Reported: No symptoms reported    Endocrine Endocrine Symptoms Reported: No symptoms reported Is patient diabetic?: Yes Is patient checking blood sugars at home?: Yes List most recent blood sugar readings, include date and time of day: FreeStyle Libre, 116    Gastrointestinal Gastrointestinal Symptoms Reported: No symptoms  reported Gastrointestinal Management Strategies: Diet modification    Genitourinary Genitourinary Symptoms Reported: No symptoms reported    Integumentary Integumentary Symptoms Reported: No symptoms reported    Musculoskeletal Musculoskelatal Symptoms Reviewed: Difficulty walking, Limited mobility, Unsteady gait Additional Musculoskeletal Details: Right sided hemiparesis Musculoskeletal Management Strategies: Medical device, Routine screening Musculoskeletal Comment: Patient reports he continues to have issues geting his Hoveround fixed. He reports he has been trying to contact them but has not had any success in getting the pieces he needs. He has an appointment tomorrow with PCP for mobility assessment for a new power wheelchair. For the time being, he continues to use a walker for ambulation. Falls in the past year?: No Number of falls in past year: 1 or less Was there an injury with Fall?: No Fall Risk Category Calculator: 0 Patient Fall Risk Level: Low Fall Risk Patient at Risk for Falls Due to: Impaired balance/gait, Impaired mobility Fall risk Follow up: Falls evaluation completed, Education provided, Falls prevention discussed  Psychosocial Psychosocial Symptoms Reported: No symptoms reported          03/27/2024    PHQ2-9 Depression Screening   Little interest or pleasure in doing things    Feeling down, depressed, or hopeless    PHQ-2 - Total Score    Trouble falling or staying asleep, or sleeping too much    Feeling tired or having little energy    Poor appetite or overeating     Feeling bad about yourself - or that you are a failure or have let yourself or your family down    Trouble concentrating on things, such as reading the newspaper or watching television    Moving or speaking so slowly that other people could have noticed.  Or the opposite - being so fidgety or restless that you  have been moving around a lot more than usual    Thoughts that you would be better off  dead, or hurting yourself in some way    PHQ2-9 Total Score    If you checked off any problems, how difficult have these problems made it for you to do your work, take care of things at home, or get along with other people    Depression Interventions/Treatment      There were no vitals filed for this visit.  Medications Reviewed Today     Reviewed by Arno Rosaline SQUIBB, RN (Registered Nurse) on 03/27/24 at [phone]  Med List Status: <None>   Medication Order Taking? Sig Documenting Provider Last Dose Status Informant  Accu-Chek Softclix Lancets lancets [id number]  Use to check blood sugar 3 times daily. Brien Belvie BRAVO, MD  Active Self, Pharmacy Records  amLODipine  (NORVASC ) 5 MG tablet [id number]  Take 1 tablet (5 mg total) by mouth daily. Newlin, Enobong, MD  Active   ammonium lactate  (AMLACTIN) 12 % cream [id number]  Apply 1 Application topically as needed for dry skin. Silva Juliene SAUNDERS, DPM  Active Self, Pharmacy Records  aspirin  EC 81 MG tablet [id number]  Take 1 tablet (81 mg total) by mouth daily. Newlin, Enobong, MD  Active   atorvastatin  (LIPITOR) 10 MG tablet [id number]  Take 1 tablet (10 mg total) by mouth daily. Newlin, Enobong, MD  Active   chlorthalidone  (HYGROTON ) 25 MG tablet [id number]  TAKE 1 TABLET EVERY DAY Newlin, Enobong, MD  Active   Continuous Glucose Sensor (FREESTYLE LIBRE 2 SENSOR) MISC [id number]  1 each by Does not apply route every 14 (fourteen) days. Newlin, Enobong, MD  Active   dapagliflozin  propanediol (FARXIGA ) 10 MG TABS tablet [id number]  Take 1 tablet (10 mg total) by mouth daily before breakfast. Newlin, Enobong, MD  Active   fenofibrate  micronized (LOFIBRA) 134 MG capsule [id number]  Take 1 capsule (134 mg total) by mouth daily before breakfast. Newlin, Enobong, MD  Active   gabapentin  (NEURONTIN ) 100 MG capsule [id number]  Take 1 capsule (100 mg total) by mouth 2 (two) times daily. Newlin, Enobong, MD  Active   glucose chewable tablet 4 g [id number]   Brien Belvie BRAVO, MD  Active   ibuprofen (ADVIL) 200 MG tablet [id number]  Take 400 mg by mouth every 6 (six) hours as needed for moderate pain (pain score 4-6).  Patient not taking: Reported on 02/28/2024   [provider]  Active Self, Pharmacy Records  insulin  aspart (NOVOLOG  FLEXPEN) 100 UNIT/ML FlexPen [id number]  Inject a max daily dose of 75 units Newlin, Enobong, MD  Active   insulin  degludec (TRESIBA  FLEXTOUCH) 200 UNIT/ML FlexTouch Pen [id number]  Inject 64 Units into the skin daily in the afternoon.  Patient taking differently: Inject 60 Units into the skin daily in the afternoon.   Newlin, Enobong, MD  Active   lisinopril  (ZESTRIL ) 2.5 MG tablet [id number]  Take 1 tablet (2.5 mg total) by mouth daily. Newlin, Enobong, MD  Active   pioglitazone  (ACTOS ) 30 MG tablet [id number]  Take 1 tablet (30 mg total) by mouth daily. Newlin, Enobong, MD  Active   sildenafil  (VIAGRA ) 100 MG tablet [id number]  Take 0.5-1 tablets (50-100 mg total) by mouth daily as needed.  Patient not taking: Reported on 02/28/2024   Wrenn, John, MD  Active             Recommendation:   PCP Follow-up Continue Current Plan of Care  Follow Up Plan:  Telephone follow up appointment date/time:  04/24/24 at 9 AM  Rosaline Finlay, RN MSN [place]  [facility] RN Care Manager Direct Dial: [phone]  Fax: [phone]

## 2024-03-28 ENCOUNTER — Telehealth: Payer: Self-pay

## 2024-03-28 ENCOUNTER — Other Ambulatory Visit (HOSPITAL_COMMUNITY): Payer: Self-pay

## 2024-03-28 ENCOUNTER — Ambulatory Visit: Attending: Nurse Practitioner | Admitting: Nurse Practitioner

## 2024-03-28 ENCOUNTER — Encounter: Payer: Self-pay | Admitting: Nurse Practitioner

## 2024-03-28 ENCOUNTER — Other Ambulatory Visit: Payer: Self-pay

## 2024-03-28 VITALS — BP 128/83 | HR 100 | Resp 20 | Ht 67.0 in | Wt 288.8 lb

## 2024-03-28 DIAGNOSIS — Z23 Encounter for immunization: Secondary | ICD-10-CM

## 2024-03-28 DIAGNOSIS — R351 Nocturia: Secondary | ICD-10-CM

## 2024-03-28 DIAGNOSIS — R350 Frequency of micturition: Secondary | ICD-10-CM | POA: Diagnosis not present

## 2024-03-28 DIAGNOSIS — I10 Essential (primary) hypertension: Secondary | ICD-10-CM

## 2024-03-28 DIAGNOSIS — R0609 Other forms of dyspnea: Secondary | ICD-10-CM

## 2024-03-28 DIAGNOSIS — N401 Enlarged prostate with lower urinary tract symptoms: Secondary | ICD-10-CM | POA: Diagnosis not present

## 2024-03-28 DIAGNOSIS — J410 Simple chronic bronchitis: Secondary | ICD-10-CM | POA: Diagnosis not present

## 2024-03-28 DIAGNOSIS — F17200 Nicotine dependence, unspecified, uncomplicated: Secondary | ICD-10-CM

## 2024-03-28 DIAGNOSIS — Z6841 Body Mass Index (BMI) 40.0 and over, adult: Secondary | ICD-10-CM

## 2024-03-28 MED ORDER — TAMSULOSIN HCL 0.4 MG PO CAPS
0.4000 mg | ORAL_CAPSULE | Freq: Every day | ORAL | 3 refills | Status: DC
  Filled 2024-03-28 – 2024-04-04 (×2): qty 30, 30d supply, fill #0
  Filled 2024-05-01: qty 30, 30d supply, fill #1
  Filled 2024-05-29: qty 30, 30d supply, fill #2
  Filled 2024-07-06: qty 30, 30d supply, fill #3

## 2024-03-28 MED ORDER — NICOTINE 21 MG/24HR TD PT24
21.0000 mg | MEDICATED_PATCH | Freq: Every day | TRANSDERMAL | 0 refills | Status: AC
  Filled 2024-03-28: qty 42, 42d supply, fill #0

## 2024-03-28 MED ORDER — ALBUTEROL SULFATE HFA 108 (90 BASE) MCG/ACT IN AERS
2.0000 | INHALATION_SPRAY | Freq: Four times a day (QID) | RESPIRATORY_TRACT | 1 refills | Status: AC | PRN
  Filled 2024-03-28 (×2): qty 6.7, 25d supply, fill #0

## 2024-03-28 MED ORDER — FLUTICASONE-SALMETEROL 100-50 MCG/ACT IN AEPB
1.0000 | INHALATION_SPRAY | Freq: Two times a day (BID) | RESPIRATORY_TRACT | 6 refills | Status: AC
  Filled 2024-03-28 (×2): qty 60, 30d supply, fill #0

## 2024-03-28 NOTE — Progress Notes (Signed)
 Assessment & Plan:  Kailin was seen today for referral.  Diagnoses and all orders for this visit:  Primary hypertension  Need for influenza vaccination  Increased urinary frequency -     PSA Benign prostatic hyperplasia with lower urinary tract symptoms Experiencing nocturia and urinary frequency, likely due to benign prostatic hyperplasia. - Prescribe tamsulosin  or Flomax  once daily to manage urinary symptoms.  Dyspnea on exertion -     Brain natriuretic peptide -     albuterol  (VENTOLIN  HFA) 108 (90 Base) MCG/ACT inhaler; Inhale 2 puffs into the lungs every 6 (six) hours as needed for wheezing or shortness of breath. -     fluticasone -salmeterol (ADVAIR ) 100-50 MCG/ACT AEPB; Inhale 1 puff into the lungs 2 (two) times daily. COPD with dyspnea and no current inhaler prescribed. - Prescribe Advair  as a maintenance inhaler. - Prescribe albuterol  as a rescue inhaler. - Order lab test to rule out heart failure as a cause of dyspnea.  BPH associated with nocturia -     tamsulosin  (FLOMAX ) 0.4 MG CAPS capsule; Take 1 capsule (0.4 mg total) by mouth daily. For urinary frequency.   4cm ascending aorta . - Refer to vascular specialist for recommendations. He has history of AAA   Type 2 diabetes mellitus with diabetic neuropathy Diabetic neuropathy with minimal pain, managed with gabapentin . A1c is 6.6, indicating good glycemic control. - Continue gabapentin  100 mg twice daily. - Discuss option to increase gabapentin  to three times daily if needed for neuropathy.   Nicotine  dependence, cigarettes Ongoing cigarette smoking with desire to quit. - Prescribe nicotine  patches to aid in smoking cessation.  Patient has been counseled on age-appropriate routine health concerns for screening and prevention. These are reviewed and up-to-date. Referrals have been placed accordingly. Immunizations are up-to-date or declined.    Subjective:   Chief Complaint  Patient presents with    Referral    History of Present Illness Reginald Rodriguez is a 73 year old male who presents for follow-up regarding HTN, his vascular health and medication management.   Past medical history significant for CKD, COPD, previous stroke with residual deficit in the right upper and lower extremity,  FLD, diabetes, hypertension, and AAA   He has a history of an AAA with EVAR. Recently a CT revealed 4cm aortic aneurysm and he was instructed was told he would need to see a vascular specialist. He is confused about the need for further evaluation, as he believed his previous surgery addressed the issue.  NEUROPATHY He takes gabapentin , 300 mg twice daily, for neuropathy. He experiences mild neuropathy symptoms without significant pain. A higher dose of 300 mg caused drowsiness. His last A1c was 6.6, which he believes has improved since moving to [facility]  CKD He has chronic kidney disease, which he has been aware of for several years.   BPH with LUTS He experiences urinary frequency, particularly at night  Tobacco Dependence He has a history of COPD but does not currently have an inhaler. He experiences shortness of breath and is interested in obtaining an inhaler to manage his symptoms. He smokes and finds it difficult to quit. He is open to receiving assistance, such as nicotine  patches, to help him stop smoking.  He experiences slight chest pains and is aware of his weight being around 280 pounds, which he acknowledges is too high. He performs chair exercises at home and has previously worked with a physical therapist.  He is in need of a replacement mobility device, specifically a Hoverround,  and is awaiting paperwork from the supplier. His current bariatric rollator is not sufficient for his mobility needs.  The patient presents today for a Hoveround personal mobility assessment.  The patient's chief complaint and reason for this visit is for a mobility exam.  The patient formally had been  using a rollator but his mobility has declined.  The main issue is that he has developed increasing fatigue weakness in the upper extremities neck hips and legs.  He is having to stop frequently for rest breaks due to shortness of breath.  He is high risk for falls and has nearly fallen on several occasions in the past.  Another issue is that his weight is increased he is up to 288 pounds.   He also has issues with increased edema and swelling in the lower extremities. BMI 45.23   The patient has edema in the lower extremities although he does not have any pressure sores.  He has reduction in ability to shift his weight in a chair position.   The patient's medical diagnosis that in fact impacts his mobility needs includes that of morbid obesity and prior stroke with residual deficits in the right upper and lower extremities.   The patient's MR ADLs affected in the home include inability to get to the bathroom to toilet and bathe with out the use of a motorized wheelchair device.  The patient no longer is able to ambulate from his bedroom into the toilet without great deal of difficulty and has nearly fallen on several occasions.  He is unable to get either to the kitchen to prepare meals properly.   He is not able to use a cane or walker.  The rollator itself has been his main source of support but he has very poor balance at this time.  He is also had nearly fallen on several occasions and this is primarily due to right upper and lower extremity weakness.   He is not able to operate a manual wheelchair in his home because of the weakness in his upper extremity on the right.  A scooter is not an option for this patient either because he has significant postural instability which is made worse by his increased weight.  I do believe he can safely operate the power mobility chair mentally and physically.  He is extremely willing and motivated to use the power mobility device in his home  environment.   Review of Systems  Constitutional:  Positive for malaise/fatigue. Negative for fever and weight loss.  HENT: Negative.  Negative for nosebleeds.   Eyes: Negative.  Negative for blurred vision, double vision and photophobia.  Respiratory:  Positive for shortness of breath. Negative for cough.   Cardiovascular:  Positive for leg swelling. Negative for chest pain and palpitations.  Gastrointestinal: Negative.  Negative for heartburn, nausea and vomiting.  Genitourinary:  Positive for frequency and urgency. Negative for dysuria, flank pain and hematuria.  Musculoskeletal: Negative.  Negative for myalgias.  Neurological:  Positive for weakness. Negative for dizziness, focal weakness, seizures and headaches.  Psychiatric/Behavioral: Negative.  Negative for suicidal ideas.     Past Medical History:  Diagnosis Date   Diabetes mellitus without complication (HCC)    Hypertension    Stroke [facility])     Past Surgical History:  Procedure Laterality Date   ABDOMINAL AORTIC ENDOVASCULAR STENT GRAFT N/A 07/18/2023   Procedure: ABDOMINAL AORTIC ENDOVASCULAR STENT GRAFT;  Surgeon: Magda Debby SAILOR, MD;  Location: [facility] OR;  Service: Vascular;  Laterality: N/A;  ULTRASOUND GUIDANCE FOR VASCULAR ACCESS Bilateral 07/18/2023   Procedure: ULTRASOUND GUIDANCE FOR VASCULAR ACCESS;  Surgeon: Magda Debby SAILOR, MD;  Location: [facility] OR;  Service: Vascular;  Laterality: Bilateral;    No family history on file.  Social History Reviewed with no changes to be made today.   Outpatient Medications Prior to Visit  Medication Sig Dispense Refill   Accu-Chek Softclix Lancets lancets Use to check blood sugar 3 times daily. 100 each 2   amLODipine  (NORVASC ) 5 MG tablet Take 1 tablet (5 mg total) by mouth daily. 90 tablet 3   ammonium lactate  (AMLACTIN) 12 % cream Apply 1 Application topically as needed for dry skin. 385 g 5   aspirin  EC 81 MG tablet Take 1 tablet (81 mg total) by mouth daily. 100 tablet 1    atorvastatin  (LIPITOR) 10 MG tablet Take 1 tablet (10 mg total) by mouth daily. 90 tablet 3   chlorthalidone  (HYGROTON ) 25 MG tablet TAKE 1 TABLET EVERY DAY 90 tablet 2   Continuous Glucose Sensor (FREESTYLE LIBRE 2 SENSOR) MISC 1 each by Does not apply route every 14 (fourteen) days. 2 each PRN   dapagliflozin  propanediol (FARXIGA ) 10 MG TABS tablet Take 1 tablet (10 mg total) by mouth daily before breakfast. 90 tablet 3   fenofibrate  micronized (LOFIBRA) 134 MG capsule Take 1 capsule (134 mg total) by mouth daily before breakfast. 90 capsule 3   gabapentin  (NEURONTIN ) 100 MG capsule Take 1 capsule (100 mg total) by mouth 2 (two) times daily. 180 capsule 1   insulin  aspart (NOVOLOG  FLEXPEN) 100 UNIT/ML FlexPen Inject a max daily dose of 75 units 75 mL 3   insulin  degludec (TRESIBA  FLEXTOUCH) 200 UNIT/ML FlexTouch Pen Inject 64 Units into the skin daily in the afternoon. (Patient taking differently: Inject 60 Units into the skin daily in the afternoon.) 45 mL 3   lisinopril  (ZESTRIL ) 2.5 MG tablet Take 1 tablet (2.5 mg total) by mouth daily. 90 tablet 3   pioglitazone  (ACTOS ) 30 MG tablet Take 1 tablet (30 mg total) by mouth daily. 90 tablet 3   sildenafil  (VIAGRA ) 100 MG tablet Take 0.5-1 tablets (50-100 mg total) by mouth daily as needed. (Patient not taking: Reported on 03/28/2024) 30 tablet 5   ibuprofen (ADVIL) 200 MG tablet Take 400 mg by mouth every 6 (six) hours as needed for moderate pain (pain score 4-6). (Patient not taking: Reported on 03/28/2024)     Facility-Administered Medications Prior to Visit  Medication Dose Route Frequency Provider Last Rate Last Admin   glucose chewable tablet 4 g  1 tablet Oral Once Brien Belvie BRAVO, MD        Allergies  Allergen Reactions   Bee Venom Swelling   Penicillins Rash       Objective:    BP 128/83 (BP Location: Left Arm, Patient Position: Sitting, Cuff Size: Normal)   Pulse 100   Resp 20   Ht 5' 7 (1.702 m)   Wt 288 lb 12.8 oz (131 kg)    SpO2 100%   BMI 45.23 kg/m  Wt Readings from Last 3 Encounters:  03/28/24 288 lb 12.8 oz (131 kg)  02/22/24 282 lb (127.9 kg)  01/17/24 275 lb (124.7 kg)    Physical Exam Vitals and nursing note reviewed.  Constitutional:      Appearance: He is well-developed. He is obese.  HENT:     Head: Normocephalic and atraumatic.  Cardiovascular:     Rate and Rhythm: Normal rate and regular rhythm.  Heart sounds: Normal heart sounds. No murmur heard.    No friction rub. No gallop.  Pulmonary:     Effort: Pulmonary effort is normal. No tachypnea or respiratory distress.     Breath sounds: Normal breath sounds. No decreased breath sounds, wheezing, rhonchi or rales.  Chest:     Chest wall: No tenderness.  Musculoskeletal:        General: Normal range of motion.     Cervical back: Normal range of motion.     Right lower leg: Edema present.     Left lower leg: Edema present.  Skin:    General: Skin is warm and dry.  Neurological:     Mental Status: He is alert and oriented to person, place, and time.     Coordination: Coordination normal.  Psychiatric:        Behavior: Behavior normal. Behavior is cooperative.        Thought Content: Thought content normal.        Judgment: Judgment normal.          Patient has been counseled extensively about nutrition and exercise as well as the importance of adherence with medications and regular follow-up. The patient was given clear instructions to go to ER or return to medical center if symptoms don't improve, worsen or new problems develop. The patient verbalized understanding.   Follow-up: Return in about 3 months (around 06/27/2024).   Haze LELON Servant, FNP-BC [facility] [place], [place] [phone]   03/28/2024, 4:06 PM

## 2024-03-28 NOTE — Telephone Encounter (Signed)
 I called [employer]: [phone] to check on the status of an order for a new powerchair for the patient.  I spoke to Toribio who stated they have an open order for a replacement and I need to speak to [employer].   He transferred me to Leslie/ Ins Dept who explained that the patient is eligible for a potential upgrade to a complex rehabilitation chair and may qualify based on changes in his health conditions.  She said that the instructions for the documentation that is needed were faxed to this clinic on 03/24/2024 and this documentation needs to be incorporated into the mobility exam visit note.   I told her that we did not receive the fax and asked that she email the documents to me which she said she will do.

## 2024-03-29 ENCOUNTER — Other Ambulatory Visit: Payer: Self-pay

## 2024-03-29 LAB — BRAIN NATRIURETIC PEPTIDE: BNP: 11.5 pg/mL (ref 0.0–100.0)

## 2024-03-29 LAB — PSA: Prostate Specific Ag, Serum: 3.9 ng/mL (ref 0.0–4.0)

## 2024-03-30 NOTE — Telephone Encounter (Signed)
 I spoke to [facility] department and she said the documentation requirements and orders for the power wheelchair were faxed to [facility] and emailed to me on 03/28/2024.  I told her that I did not receive it and she said she will refax.  I then confirmed with Jackolyn, CMA that the documents have been received for PCP

## 2024-04-02 ENCOUNTER — Emergency Department (HOSPITAL_COMMUNITY)
Admission: EM | Admit: 2024-04-02 | Discharge: 2024-04-03 | Disposition: A | Discharge status: Home / Self Care | Attending: Emergency Medicine | Admitting: Emergency Medicine

## 2024-04-02 ENCOUNTER — Emergency Department (HOSPITAL_COMMUNITY)

## 2024-04-02 ENCOUNTER — Other Ambulatory Visit: Payer: Self-pay

## 2024-04-02 ENCOUNTER — Encounter (HOSPITAL_COMMUNITY): Payer: Self-pay

## 2024-04-02 DIAGNOSIS — I10 Essential (primary) hypertension: Secondary | ICD-10-CM | POA: Insufficient documentation

## 2024-04-02 DIAGNOSIS — K760 Fatty (change of) liver, not elsewhere classified: Secondary | ICD-10-CM | POA: Diagnosis not present

## 2024-04-02 DIAGNOSIS — I7143 Infrarenal abdominal aortic aneurysm, without rupture: Secondary | ICD-10-CM | POA: Diagnosis not present

## 2024-04-02 DIAGNOSIS — R109 Unspecified abdominal pain: Secondary | ICD-10-CM | POA: Diagnosis not present

## 2024-04-02 DIAGNOSIS — K449 Diaphragmatic hernia without obstruction or gangrene: Secondary | ICD-10-CM | POA: Insufficient documentation

## 2024-04-02 DIAGNOSIS — M47816 Spondylosis without myelopathy or radiculopathy, lumbar region: Secondary | ICD-10-CM | POA: Diagnosis not present

## 2024-04-02 DIAGNOSIS — R935 Abnormal findings on diagnostic imaging of other abdominal regions, including retroperitoneum: Secondary | ICD-10-CM | POA: Diagnosis not present

## 2024-04-02 DIAGNOSIS — E119 Type 2 diabetes mellitus without complications: Secondary | ICD-10-CM | POA: Insufficient documentation

## 2024-04-02 DIAGNOSIS — M5459 Other low back pain: Secondary | ICD-10-CM | POA: Diagnosis not present

## 2024-04-02 DIAGNOSIS — M545 Low back pain, unspecified: Secondary | ICD-10-CM | POA: Insufficient documentation

## 2024-04-02 LAB — COMPREHENSIVE METABOLIC PANEL WITH GFR
ALT: 20 U/L (ref 0–44)
AST: 26 U/L (ref 15–41)
Albumin: 3.8 g/dL (ref 3.5–5.0)
Alkaline Phosphatase: 41 U/L (ref 38–126)
Anion gap: 11 (ref 5–15)
BUN: 26 mg/dL — ABNORMAL HIGH (ref 8–23)
CO2: 27 mmol/L (ref 22–32)
Calcium: 9.6 mg/dL (ref 8.9–10.3)
Chloride: 97 mmol/L — ABNORMAL LOW (ref 98–111)
Creatinine, Ser: 1.92 mg/dL — ABNORMAL HIGH (ref 0.61–1.24)
GFR, Estimated: 37 mL/min — ABNORMAL LOW (ref >60)
Glucose, Bld: 98 mg/dL (ref 70–99)
Potassium: 3.9 mmol/L (ref 3.5–5.1)
Sodium: 135 mmol/L (ref 135–145)
Total Bilirubin: 0.8 mg/dL (ref 0.0–1.2)
Total Protein: 6.9 g/dL (ref 6.5–8.1)

## 2024-04-02 LAB — URINALYSIS, ROUTINE W REFLEX MICROSCOPIC
Bacteria, UA: NONE SEEN
Bilirubin Urine: NEGATIVE
Glucose, UA: 150 mg/dL — AB
Hgb urine dipstick: NEGATIVE
Ketones, ur: NEGATIVE mg/dL
Nitrite: NEGATIVE
Protein, ur: NEGATIVE mg/dL
Specific Gravity, Urine: 1.015 (ref 1.005–1.030)
pH: 7 (ref 5.0–8.0)

## 2024-04-02 LAB — CBC WITH DIFFERENTIAL/PLATELET
Abs Immature Granulocytes: 0.02 K/uL (ref 0.00–0.07)
Basophils Absolute: 0.1 K/uL (ref 0.0–0.1)
Basophils Relative: 1 %
Eosinophils Absolute: 0.3 K/uL (ref 0.0–0.5)
Eosinophils Relative: 4 %
HCT: 45.8 % (ref 39.0–52.0)
Hemoglobin: 14.7 g/dL (ref 13.0–17.0)
Immature Granulocytes: 0 %
Lymphocytes Relative: 40 %
Lymphs Abs: 3.3 K/uL (ref 0.7–4.0)
MCH: 30.4 pg (ref 26.0–34.0)
MCHC: 32.1 g/dL (ref 30.0–36.0)
MCV: 94.6 fL (ref 80.0–100.0)
Monocytes Absolute: 0.9 K/uL (ref 0.1–1.0)
Monocytes Relative: 10 %
Neutro Abs: 3.8 K/uL (ref 1.7–7.7)
Neutrophils Relative %: 45 %
Platelets: 268 K/uL (ref 150–400)
RBC: 4.84 MIL/uL (ref 4.22–5.81)
RDW: 16.3 % — ABNORMAL HIGH (ref 11.5–15.5)
WBC: 8.4 K/uL (ref 4.0–10.5)
nRBC: 0 % (ref 0.0–0.2)

## 2024-04-02 MED ORDER — HYDROCODONE-ACETAMINOPHEN 5-325 MG PO TABS
1.0000 | ORAL_TABLET | Freq: Once | ORAL | Status: AC
  Administered 2024-04-02: 1 via ORAL
  Filled 2024-04-02: qty 1

## 2024-04-02 NOTE — ED Triage Notes (Signed)
 PT arrives via EMS from home. Pt reports lower back pain and back spasms since this morning. PT denies injury. Hx of chronic back pain. Pt is AxOx4

## 2024-04-02 NOTE — ED Provider Triage Note (Cosign Needed)
 Emergency Medicine Provider Triage Evaluation Note  Boden Stucky , a 73 y.o. male  was evaluated in triage.  Pt complains of back pain. Reports that pain began 1 week ago. Pain is in the middle of his back and radiates to his left flank. Pain improves with 800 mg ibuprofen, however he is worried about continuing to take this as he has CKD and has been told he cannot take NSAIDs. Denies any trauma to his back or overuse. Reports he is disabled and gets around with a Hoveround. Denies any sharp shooting pain down his legs, numbness/tingling in extremities. No history of similar pain previously. No nausea, vomiting, or diarrhea. No urinary symptoms.  No loss of bowel or bladder function or saddle anesthesia.  Review of Systems  Positive:  Negative:   Physical Exam  BP (!) 133/103   Pulse 93   Temp 97.9 F (36.6 C)   Resp 20   SpO2 97%  Gen:   Awake, no distress   Resp:  Normal effort  MSK:   Moves extremities without difficulty  Other:  TTP noted to low lumbar spine and into the left flank area. Abdomen soft and non-tender.  No step-offs, lesions, deformity, or overlying skin changes.  Medical Decision Making  Medically screening exam initiated at 6:50 PM.  Appropriate orders placed.  Breeze Tufo was informed that the remainder of the evaluation will be completed by another provider, this initial triage assessment does not replace that evaluation, and the importance of remaining in the ED until their evaluation is complete.  Work-up initiated   Haizley Cannella A, PA-C 04/02/24 1900

## 2024-04-03 DIAGNOSIS — M545 Low back pain, unspecified: Secondary | ICD-10-CM | POA: Diagnosis not present

## 2024-04-03 LAB — CBG MONITORING, ED: Glucose-Capillary: 109 mg/dL — ABNORMAL HIGH (ref 70–99)

## 2024-04-03 MED ORDER — METHOCARBAMOL 500 MG PO TABS
500.0000 mg | ORAL_TABLET | Freq: Four times a day (QID) | ORAL | Status: DC | PRN
  Administered 2024-04-03: 500 mg via ORAL
  Filled 2024-04-03: qty 1

## 2024-04-03 NOTE — ED Provider Notes (Signed)
 Emergency Department Provider Note   I have reviewed the triage vital signs and the nursing notes.   HISTORY  Chief Complaint Back Pain   HPI Reginald Rodriguez is a 73 y.o. male past history of hypertension and diabetes presents the emergency department with lower back spasms.  Spasm pain is in the right lower back without numbness or weakness into the lower extremities.  No falls or injuries.  Patient typically ambulates with a rollator.  He states he has an Mining engineer wheelchair at home but it is broken and he is working with his insurance on getting it repaired.  He does not have a manual wheelchair.  Since his back spasms have worsened over the past 2 days he has been unable to ambulate at home securely.  No fevers or chills.  No urinary symptoms.  He notes a prior history of aneurysm repair but denies any abdominal pain currently. No CP.    Past Medical History:  Diagnosis Date   Diabetes mellitus without complication (HCC)    Hypertension    Stroke [facility])     Review of Systems  Constitutional: No fever/chills Cardiovascular: Denies chest pain. Respiratory: Denies shortness of breath. Gastrointestinal: No abdominal pain.  No nausea, no vomiting.   Genitourinary: Negative for dysuria. Musculoskeletal: Positive for back pain. Skin: Negative for rash. Neurological: Negative for headaches, focal weakness or numbness.  ____________________________________________   PHYSICAL EXAM:  VITAL SIGNS: ED Triage Vitals  Encounter Vitals Group     BP 04/02/24 1825 (!) 133/103     Pulse Rate 04/02/24 1825 93     Resp 04/02/24 1825 20     Temp 04/02/24 1825 97.9 F (36.6 C)     Temp src --      SpO2 04/02/24 1821 98 %   Constitutional: Alert and oriented. Well appearing and in no acute distress. Eyes: Conjunctivae are normal.  Head: Atraumatic. {**Ears:  Healthy appearing ear canals and TMs bilaterally **}Nose: No congestion/rhinnorhea. Mouth/Throat: Mucous membranes are  moist.  Oropharynx non-erythematous. Neck: No stridor.  No meningeal signs.  {**No cervical spine tenderness to palpation.**} Cardiovascular: Normal rate, regular rhythm. Good peripheral circulation. Grossly normal heart sounds.   Respiratory: Normal respiratory effort.  No retractions. Lungs CTAB. Gastrointestinal: Soft and nontender. No distention.  {**Genitourinary:  **}Musculoskeletal: No lower extremity tenderness nor edema. No gross deformities of extremities. Neurologic:  Normal speech and language. No gross focal neurologic deficits are appreciated.  Skin:  Skin is warm, dry and intact. No rash noted. {**Psychiatric: Mood and affect are normal. Speech and behavior are normal.**}  ____________________________________________   LABS (all labs ordered are listed, but only abnormal results are displayed)  Labs Reviewed  CBC WITH DIFFERENTIAL/PLATELET - Abnormal; Notable for the following components:      Result Value   RDW 16.3 (*)    All other components within normal limits  COMPREHENSIVE METABOLIC PANEL WITH GFR - Abnormal; Notable for the following components:   Chloride 97 (*)    BUN 26 (*)    Creatinine, Ser 1.92 (*)    GFR, Estimated 37 (*)    All other components within normal limits  URINALYSIS, ROUTINE W REFLEX MICROSCOPIC - Abnormal; Notable for the following components:   Glucose, UA 150 (*)    Leukocytes,Ua TRACE (*)    All other components within normal limits   ____________________________________________  EKG  *** ____________________________________________  RADIOLOGY  CT L-SPINE NO CHARGE Result Date: 04/02/2024 CLINICAL DATA:  Pt complains of right sided flank  pain EXAM: CT LUMBAR SPINE WITHOUT CONTRAST TECHNIQUE: Multidetector CT imaging of the lumbar spine was performed without intravenous contrast administration. Multiplanar CT image reconstructions were also generated. RADIATION DOSE REDUCTION: This exam was performed according to the departmental  dose-optimization program which includes automated exposure control, adjustment of the mA and/or kV according to patient size and/or use of iterative reconstruction technique. COMPARISON:  None Available. FINDINGS: Segmentation: 5 lumbar type vertebrae. Alignment: Normal. Vertebrae: Multilevel mild degenerative changes spine. No acute fracture or focal pathologic process. Paraspinal and other soft tissues: Negative. Disc levels: Maintained. IMPRESSION: No acute displaced fracture or traumatic listhesis of the lumbar spine. Electronically Signed   By: Morgane  Naveau M.D.   On: 04/02/2024 20:05   CT Renal Stone Study Result Date: 04/02/2024 CLINICAL DATA:  Abdominal/flank pain, stone suspected EXAM: CT ABDOMEN AND PELVIS WITHOUT CONTRAST TECHNIQUE: Multidetector CT imaging of the abdomen and pelvis was performed following the standard protocol without IV contrast. RADIATION DOSE REDUCTION: This exam was performed according to the departmental dose-optimization program which includes automated exposure control, adjustment of the mA and/or kV according to patient size and/or use of iterative reconstruction technique. COMPARISON:  CT abdomen pelvis 01/17/2024 CT chest 05/29/2021 FINDINGS: Lower chest: No acute abnormality.  Tiny hiatal hernia. Hepatobiliary: The hepatic parenchyma is diffusely hypodense compared to the splenic parenchyma consistent with fatty infiltration. No focal liver abnormality. No gallstones, gallbladder wall thickening, or pericholecystic fluid. No biliary dilatation. Pancreas: No focal lesion. Normal pancreatic contour. No surrounding inflammatory changes. No main pancreatic ductal dilatation. Spleen: Normal in size without focal abnormality. Adrenals/Urinary Tract: Stable 2.1 x 1.4 cm right adrenal gland nodule with a density of 6 Hounsfield units suggestive of an adrenal adenoma-no further follow-up indicated. Stable 2 x 1.9 cm left adrenal gland nodule with a density of 2 Hounsfield units  suggestive of an adrenal adenoma-no further follow-up indicated. No nephrolithiasis and no hydronephrosis. No definite contour-deforming renal mass. No ureterolithiasis or hydroureter. The urinary bladder is unremarkable. Stomach/Bowel: Stomach is within normal limits. No evidence of bowel wall thickening or dilatation. Appendix appears normal. Vascular/Lymphatic: Stable 7.3 x 7 cm infrarenal abdominal aorta aneurysm extending to the aortic bifurcation approximately 9 cm in the craniocaudal dimension status post aorto bi-iliac stent graft repair. Limited evaluation on this noncontrast study. Mild atherosclerotic plaque of the aorta and its branches. No abdominal, pelvic, or inguinal lymphadenopathy. Reproductive: Prostate is unremarkable. Other: No intraperitoneal free fluid. No intraperitoneal free gas. No organized fluid collection. Musculoskeletal: No abdominal wall hernia or abnormality. No suspicious lytic or blastic osseous lesions. No acute displaced fracture. IMPRESSION: 1. No acute intra-abdominal or intrapelvic abnormality with limited evaluation on this noncontrast study. 2. Hepatic steatosis. 3. Stable 7.3 x 7 cm infrarenal abdominal aorta aneurysm extending to the aortic bifurcation approximately 9 cm in the craniocaudal dimension status post aorto bi-iliac stent graft repair. Limited evaluation on this noncontrast study. 4. Tiny hiatal hernia Electronically Signed   By: Morgane  Naveau M.D.   On: 04/02/2024 19:55    ____________________________________________   PROCEDURES  Procedure(s) performed:   Procedures   ____________________________________________   INITIAL IMPRESSION / ASSESSMENT AND PLAN / ED COURSE  Pertinent labs & imaging results that were available during my care of the patient were reviewed by me and considered in my medical decision making (see chart for details).   This patient is Presenting for Evaluation of ***, which {Range:23949} require a range of treatment  options, and {MDMcomplaint:23950} a complaint that involves a {MDMlevelrisk:23951} risk of  morbidity and mortality.  The Differential Diagnoses include***.  Critical Interventions-    Medications  HYDROcodone -acetaminophen  (NORCO/VICODIN) 5-325 MG per tablet 1 tablet (1 tablet Oral Given 04/02/24 1851)    Reassessment after intervention:     I *** Additional Historical Information from ***, as the patient is ***.  I decided to review pertinent External Data, and in summary ***.   Clinical Laboratory Tests Ordered, included   Radiologic Tests Ordered, included ***. I independently interpreted the images and agree with radiology interpretation.   Cardiac Monitor Tracing which shows ***   Social Determinants of Health Risk ***  Consult complete with     Durable Medical Equipment  (From admission, onward)           Start     Ordered   04/03/24 0221  For home use only DME standard manual wheelchair with seat cushion  Once       Comments: Patient suffers from back pain which impairs their ability to perform daily activities like bathing, dressing, grooming, and toileting in the home.  A cane, crutch, or walker will not resolve issue with performing activities of daily living. A wheelchair will allow patient to safely perform daily activities. Patient can safely propel the wheelchair in the home or has a caregiver who can provide assistance. Length of need 6 months . Accessories: elevating leg rests (ELRs), wheel locks, extensions and anti-tippers.   04/03/24 0222             Medical Decision Making: Summary: ***  Reevaluation with update and discussion with   ***Considered admission***  Patient's presentation is most consistent with {EM COPA:27473}   Disposition:   ____________________________________________  FINAL CLINICAL IMPRESSION(S) / ED DIAGNOSES  Final diagnoses:  None     NEW OUTPATIENT MEDICATIONS STARTED DURING THIS VISIT:  New Prescriptions    No medications on file    Note:  This document was prepared using Dragon voice recognition software and may include unintentional dictation errors.  Fonda Law, MD, [facility]

## 2024-04-03 NOTE — Progress Notes (Unsigned)
 [address] [place] [address]             [phone]            Reginald Rodriguez [id number] 1950-12-27   History of Present Illness:  Reginald Rodriguez is a 73 year old man with medical history of hypertension, abdominal aortic aneurysm with surgical repair, stroke with right side hemiparesis, COPD, OSA, type 2 diabetes, arthritis, chronic kidney disease stage 3, and tobacco user who presents for initial encounter of ascending thoracic aortic aneurysm.  This was found on low dose CT scan for lung cancer screening.  Aneurysm measured 4.0 cm on CT scan on 01/28/2024.  He had surgical repair of abdominal aortic aneurysm in 06/2023.  He reports that he has been doing well.  His blood pressure is well controlled with current medication therapy.  He reports that it was elevated recently when he was evaluated in the ER for back pain.  Once pain was under control his blood pressure decreased.  He is attempting to quit smoking.  He smokes around half a pack a day.  He is going to start using nicoderm patches for cessation.  He does attempt to exercise his legs and arms while in his wheelchair by using resistance bands.  He denies heavy lifting.  He denies chest pain, shortness of breath and lower leg swelling.    Current Outpatient Medications on File Prior to Visit  Medication Sig Dispense Refill   Accu-Chek Softclix Lancets lancets Use to check blood sugar 3 times daily. 100 each 2   albuterol  (VENTOLIN  HFA) 108 (90 Base) MCG/ACT inhaler Inhale 2 puffs into the lungs every 6 (six) hours as needed for wheezing or shortness of breath. 6.7 g 1   amLODipine  (NORVASC ) 5 MG tablet Take 1 tablet (5 mg total) by mouth daily. 90 tablet 3   ammonium lactate  (AMLACTIN) 12 % cream Apply 1 Application topically as needed for dry skin. 385 g 5   atorvastatin  (LIPITOR) 10 MG tablet Take 1 tablet (10 mg total) by mouth daily. 90 tablet 3   chlorthalidone  (HYGROTON ) 25 MG tablet  TAKE 1 TABLET EVERY DAY 90 tablet 2   Continuous Glucose Sensor (FREESTYLE LIBRE 2 SENSOR) MISC 1 each by Does not apply route every 14 (fourteen) days. 2 each PRN   dapagliflozin  propanediol (FARXIGA ) 10 MG TABS tablet Take 1 tablet (10 mg total) by mouth daily before breakfast. 90 tablet 3   fenofibrate  micronized (LOFIBRA) 134 MG capsule Take 1 capsule (134 mg total) by mouth daily before breakfast. 90 capsule 3   fluticasone -salmeterol (ADVAIR ) 100-50 MCG/ACT AEPB Inhale 1 puff into the lungs 2 (two) times daily. 60 each 6   gabapentin  (NEURONTIN ) 100 MG capsule Take 1 capsule (100 mg total) by mouth 2 (two) times daily. 180 capsule 1   insulin  aspart (NOVOLOG  FLEXPEN) 100 UNIT/ML FlexPen Inject a max daily dose of 75 units 75 mL 3   insulin  degludec (TRESIBA  FLEXTOUCH) 200 UNIT/ML FlexTouch Pen Inject 64 Units into the skin daily in the afternoon. (Patient taking differently: Inject 60 Units into the skin daily in the afternoon.) 45 mL 3   lisinopril  (ZESTRIL ) 2.5 MG tablet Take 1 tablet (2.5 mg total) by mouth daily. 90 tablet 3   nicotine  (NICODERM CQ  - DOSED IN MG/24 HOURS) 21 mg/24hr patch Place 1 patch (21 mg total) onto the skin daily. 42 patch 0   pioglitazone  (ACTOS )  30 MG tablet Take 1 tablet (30 mg total) by mouth daily. 90 tablet 3   sildenafil  (VIAGRA ) 100 MG tablet Take 0.5-1 tablets (50-100 mg total) by mouth daily as needed. 30 tablet 5   tamsulosin  (FLOMAX ) 0.4 MG CAPS capsule Take 1 capsule (0.4 mg total) by mouth daily. For urinary frequency. 30 capsule 3   Current Facility-Administered Medications on File Prior to Visit  Medication Dose Route Frequency Provider Last Rate Last Admin   glucose chewable tablet 4 g  1 tablet Oral Once Wright, Patrick E, MD         ROS: Review of Systems  Constitutional: Negative.  Negative for fever and malaise/fatigue.  Respiratory: Negative.  Negative for cough, shortness of breath and wheezing.   Cardiovascular:  Negative for chest  pain, palpitations and leg swelling.  Musculoskeletal:  Positive for back pain.     BP 119/83   Pulse 97   Resp 18   Ht 5' 7 (1.702 m)   Wt 285 lb (129.3 kg)   SpO2 96%   BMI 44.64 kg/m   Physical Exam Constitutional:      Appearance: Normal appearance.  HENT:     Head: Normocephalic and atraumatic.  Cardiovascular:     Rate and Rhythm: Normal rate and regular rhythm.     Heart sounds: Normal heart sounds, S1 normal and S2 normal.  Pulmonary:     Effort: Pulmonary effort is normal.     Breath sounds: Normal breath sounds.  Skin:    General: Skin is warm and dry.  Neurological:     General: No focal deficit present.     Mental Status: He is alert and oriented to person, place, and time.      Imaging: CLINICAL DATA:  Current 49 pack-year smoker.   EXAM: CT CHEST WITHOUT CONTRAST LOW-DOSE FOR LUNG CANCER SCREENING   TECHNIQUE: Multidetector CT imaging of the chest was performed following the standard protocol without IV contrast.   RADIATION DOSE REDUCTION: This exam was performed according to the departmental dose-optimization program which includes automated exposure control, adjustment of the mA and/or kV according to patient size and/or use of iterative reconstruction technique.   COMPARISON:  05/29/2022.   FINDINGS: Cardiovascular: Atherosclerotic calcification of the aorta and coronary arteries. Heart is at the upper limits of normal in size. No pericardial effusion. Ascending aorta measures 4.0 cm (coronal image 157), stable.   Mediastinum/Nodes: No pathologically enlarged mediastinal or axillary lymph nodes. Hilar regions are difficult to definitively evaluate without IV contrast. Esophagus is grossly unremarkable.   Lungs/Pleura: Centrilobular and paraseptal emphysema. 2.1 mm left upper lobe nodule. No new pulmonary nodules. No pleural fluid. Airway is unremarkable.   Upper Abdomen: Liver is decreased in attenuation diffusely. Small bilateral  adrenal adenomas. No specific follow-up necessary. Visualized portions of the liver, gallbladder, adrenal glands, kidneys, spleen, pancreas, stomach and bowel are otherwise grossly unremarkable. No upper abdominal adenopathy.   Musculoskeletal: Degenerative changes in the spine.   IMPRESSION: 1. Lung-RADS 2, benign appearance or behavior. Continue annual screening with low-dose chest CT without contrast in 12 months. 2. 4.0 cm ascending aortic aneurysm, stable. Recommend annual imaging followup by CTA or MRA. This recommendation follows 2010 ACCF/AHA/AATS/ACR/ASA/SCA/SCAI/SIR/STS/SVM Guidelines for the Diagnosis and Management of Patients with Thoracic Aortic Disease. Circulation. 2010; 121: Z733-z630. Aortic aneurysm NOS (ICD10-I71.9). 3. Hepatic steatosis. 4. Bilateral adrenal adenomas. 5. Aortic atherosclerosis (ICD10-I70.0). Coronary artery calcification. 6.  Emphysema (ICD10-J43.9).     Electronically Signed   By: Newell  Blietz M.D.   On: 02/07/2024 10:34     A/P:  Aneurysm of ascending aorta without rupture (HCC) -4.0 ascending thoracic aortic aneurysm on CT scan of chest. We discussed the natural history and and risk factors for growth of ascending aortic aneurysms. Discussed recommendations to minimize the risk of further expansion or dissection including careful blood pressure control, avoidance of contact sports and heavy lifting, attention to lipid management.  We covered the importance of smoking cessation.  The patient does not yet meet surgical criteria of >5.5cm. The patient is aware of signs and symptoms of aortic dissection and when to present to the emergency department   -Follow up in one year with CT of chest   Risk Modification:  Statin:  atorvastatin   Smoking cessation instruction/counseling given:  counseled patient on the dangers of tobacco use, advised patient to stop smoking, and reviewed strategies to maximize success  Patient was counseled on  importance of Blood Pressure Control  They are instructed to contact their Primary Care Physician if they start to have blood pressure readings over 130s/90s. Do not ever stop blood pressure medications on your own, unless instructed by healthcare professional.  Please avoid use of Fluoroquinolones as this can potentially increase your risk of Aortic Rupture and/or Dissection  Patient educated on signs and symptoms of Aortic Dissection, handout also provided in AVS  Manuelita CHRISTELLA Rough, PA-C 04/04/24

## 2024-04-04 ENCOUNTER — Other Ambulatory Visit (HOSPITAL_COMMUNITY): Payer: Self-pay

## 2024-04-04 ENCOUNTER — Ambulatory Visit

## 2024-04-04 ENCOUNTER — Other Ambulatory Visit: Payer: Self-pay | Admitting: Family Medicine

## 2024-04-04 ENCOUNTER — Ambulatory Visit: Payer: Self-pay | Admitting: Nurse Practitioner

## 2024-04-04 ENCOUNTER — Other Ambulatory Visit: Payer: Self-pay

## 2024-04-04 VITALS — BP 119/83 | HR 97 | Resp 18 | Ht 67.0 in | Wt 285.0 lb

## 2024-04-04 DIAGNOSIS — I7121 Aneurysm of the ascending aorta, without rupture: Secondary | ICD-10-CM | POA: Diagnosis not present

## 2024-04-04 DIAGNOSIS — Z9889 Other specified postprocedural states: Secondary | ICD-10-CM | POA: Diagnosis not present

## 2024-04-04 DIAGNOSIS — Z8679 Personal history of other diseases of the circulatory system: Secondary | ICD-10-CM

## 2024-04-04 MED ORDER — ASPIRIN 81 MG PO TBEC
81.0000 mg | DELAYED_RELEASE_TABLET | Freq: Every day | ORAL | 1 refills | Status: AC
  Filled 2024-04-04: qty 30, 30d supply, fill #0
  Filled 2024-05-01: qty 30, 30d supply, fill #1
  Filled 2024-05-29: qty 30, 30d supply, fill #2

## 2024-04-04 NOTE — Patient Instructions (Signed)
 Risk Modification in those with ascending thoracic aortic aneurysm:   Continue control of blood pressure (prefer BP 130/80 or less)   2. Avoid fluoroquinolone antibiotics (I.e Ciprofloxacin , Avelox, Levofloxacin, Ofloxacin )   3.  Use of statin (to decrease cardiovascular risk)   4.  Exercise and activity limitations is individualized, but in general, contact sports are to be avoided and one should avoid heavy lifting (defined as half of ideal body weight) and exercises involving sustained Valsalva maneuver.   5.  Follow-up in one with CT chest.  OK to use a non-contrast CT if you have had a recent study for surveillance of the lung nodule.

## 2024-04-05 ENCOUNTER — Other Ambulatory Visit: Payer: Self-pay

## 2024-04-13 NOTE — Telephone Encounter (Signed)
 We will need to follow up with [facility] when she returns next week.

## 2024-04-13 NOTE — Telephone Encounter (Signed)
 I spoke to the patient and informed him that we received paperwork about 2 weeks ago from [facility] for PCP to complete and I will check with PCP on the status of the documentation.  Zelda- any update?

## 2024-04-13 NOTE — Telephone Encounter (Signed)
 Copied from CRM #[id number]. Topic: General - Other >> Apr 13, 2024 10:17 AM Burnard DEL wrote:  Reason for CRM: Patient called in requesting a phone call from [employer] who is helping him get a new hover round.Patient is very frustrated that he has not heard from any who in four months regarding this.

## 2024-04-14 NOTE — Telephone Encounter (Signed)
 Copied from CRM #[id number]. Topic: Clinical - Order For Equipment >> Apr 12, 2024 11:02 AM Darshell M wrote: Reason for CRM: Patient calling in to speak with someone regarding repair for his hover round. Per patient he is waiting for someone to reach out to [facility].

## 2024-04-16 NOTE — Telephone Encounter (Signed)
 I spoke to the patient after this message was received

## 2024-04-20 ENCOUNTER — Ambulatory Visit: Admitting: Family Medicine

## 2024-04-21 ENCOUNTER — Ambulatory Visit: Admitting: Nurse Practitioner

## 2024-04-24 ENCOUNTER — Other Ambulatory Visit: Payer: Self-pay

## 2024-04-24 NOTE — Patient Outreach (Signed)
 Complex Care Management   Visit Note  04/24/2024  Name:  Reginald Rodriguez MRN: [id number] DOB: [birth year]  Situation: Referral received for Complex Care Management related to Referral from [facility] following AAA repair I obtained verbal consent from Patient.  Visit completed with Patient  on the phone  Background:   Past Medical History:  Diagnosis Date   Diabetes mellitus without complication (HCC)    Hypertension    Stroke [facility])     Assessment: Patient Reported Symptoms:  Cognitive Cognitive Status: Able to follow simple commands, Alert and oriented to person, place, and time, Normal speech and language skills Cognitive/Intellectual Conditions Management [RPT]: None reported or documented in medical history or problem list      Neurological Neurological Review of Symptoms: Weakness (R sided since stroke 2021) Neurological Management Strategies: Medical device, Medication therapy, Routine screening  HEENT HEENT Symptoms Reported: No symptoms reported      Cardiovascular Cardiovascular Symptoms Reported: No symptoms reported Does patient have uncontrolled Hypertension?: No Cardiovascular Management Strategies: Medical device, Medication therapy, Routine screening  Respiratory Respiratory Symptoms Reported: No symptoms reported Respiratory Management Strategies: Routine screening  Endocrine Endocrine Symptoms Reported: No symptoms reported Is patient diabetic?: Yes Is patient checking blood sugars at home?: Yes List most recent blood sugar readings, include date and time of day: FreeStyle Libre, 110 fasting    Gastrointestinal Gastrointestinal Symptoms Reported: No symptoms reported      Genitourinary Genitourinary Symptoms Reported: No symptoms reported    Integumentary Integumentary Symptoms Reported: No symptoms reported    Musculoskeletal Musculoskelatal Symptoms Reviewed: Difficulty walking, Limited mobility, Unsteady gait Additional Musculoskeletal Details: Right sided  hemiparesis. Note that patient went to the ED 04/02/24 for low back spasms. Patient reports he did not have a good experience so he chose to leave. Emotional support provided. Patient reports back discomfort is still there, but better than what it was Musculoskeletal Management Strategies: Medical device, Routine screening (Using walker until Hoveround is fixed) Musculoskeletal Comment: Patient continues to work with Slater at PCP office to get new hoveround. He reports issue is still unresolved Falls in the past year?: No Number of falls in past year: 1 or less (Denies falls since previous CMRN visit) Was there an injury with Fall?: No Fall Risk Category Calculator: 0 Patient Fall Risk Level: Low Fall Risk Patient at Risk for Falls Due to: Impaired balance/gait, Impaired mobility Fall risk Follow up: Falls evaluation completed, Education provided, Falls prevention discussed  Psychosocial Psychosocial Symptoms Reported: No symptoms reported          04/24/2024    PHQ2-9 Depression Screening   Little interest or pleasure in doing things    Feeling down, depressed, or hopeless    PHQ-2 - Total Score    Trouble falling or staying asleep, or sleeping too much    Feeling tired or having little energy    Poor appetite or overeating     Feeling bad about yourself - or that you are a failure or have let yourself or your family down    Trouble concentrating on things, such as reading the newspaper or watching television    Moving or speaking so slowly that other people could have noticed.  Or the opposite - being so fidgety or restless that you have been moving around a lot more than usual    Thoughts that you would be better off dead, or hurting yourself in some way    PHQ2-9 Total Score    If you checked off any problems,  how difficult have these problems made it for you to do your work, take care of things at home, or get along with other people    Depression Interventions/Treatment       There were no vitals filed for this visit.  Medications Reviewed Today     Reviewed by Arno Rosaline SQUIBB, RN (Registered Nurse) on 04/24/24 at 1048  Med List Status: <None>   Medication Order Taking? Sig Documenting Provider Last Dose Status Informant  Accu-Chek Softclix Lancets lancets [id number]  Use to check blood sugar 3 times daily. Brien Belvie BRAVO, MD  Active Self, Pharmacy Records  albuterol  (VENTOLIN  [facility]) [phone] Base) MCG/ACT inhaler [id number]  Inhale 2 puffs into the lungs every 6 (six) hours as needed for wheezing or shortness of breath. Theotis Haze ORN, NP  Active   amLODipine  (NORVASC ) 5 MG tablet [id number]  Take 1 tablet (5 mg total) by mouth daily. Newlin, Enobong, MD  Active   ammonium lactate  (AMLACTIN) 12 % cream [id number]  Apply 1 Application topically as needed for dry skin. Silva Juliene SAUNDERS, DPM  Active Self, Pharmacy Records  aspirin  EC (ASPIRIN  LOW DOSE) 81 MG tablet [id number]  Take 1 tablet (81 mg total) by mouth daily. Newlin, Enobong, MD  Active   atorvastatin  (LIPITOR) 10 MG tablet [id number]  Take 1 tablet (10 mg total) by mouth daily. Newlin, Enobong, MD  Active   chlorthalidone  (HYGROTON ) 25 MG tablet [id number]  TAKE 1 TABLET EVERY DAY Newlin, Enobong, MD  Active   Continuous Glucose Sensor (FREESTYLE LIBRE 2 SENSOR) MISC [id number]  1 each by Does not apply route every 14 (fourteen) days. Newlin, Enobong, MD  Active   dapagliflozin  propanediol (FARXIGA ) 10 MG TABS tablet [id number]  Take 1 tablet (10 mg total) by mouth daily before breakfast. Newlin, Enobong, MD  Active   fenofibrate  micronized (LOFIBRA) 134 MG capsule [id number]  Take 1 capsule (134 mg total) by mouth daily before breakfast. Newlin, Enobong, MD  Active   fluticasone -salmeterol (ADVAIR ) 100-50 MCG/ACT AEPB [id number]  Inhale 1 puff into the lungs 2 (two) times daily. Theotis Haze ORN, NP  Active   gabapentin  (NEURONTIN ) 100 MG capsule [id number]  Take 1 capsule (100 mg total) by mouth 2 (two)  times daily. Newlin, Enobong, MD  Active   glucose chewable tablet 4 g [id number]   Brien Belvie BRAVO, MD  Active   insulin  aspart (NOVOLOG  FLEXPEN) 100 UNIT/ML FlexPen [id number]  Inject a max daily dose of 75 units Newlin, Enobong, MD  Active   insulin  degludec (TRESIBA  FLEXTOUCH) 200 UNIT/ML FlexTouch Pen [id number]  Inject 64 Units into the skin daily in the afternoon.  Patient taking differently: Inject 60 Units into the skin daily in the afternoon.   Newlin, Enobong, MD  Active   lisinopril  (ZESTRIL ) 2.5 MG tablet [id number]  Take 1 tablet (2.5 mg total) by mouth daily. Newlin, Enobong, MD  Active   nicotine  (NICODERM CQ  - DOSED IN MG/24 HOURS) 21 mg/24hr patch [id number]  Place 1 patch (21 mg total) onto the skin daily. Theotis Haze ORN, NP  Active   pioglitazone  (ACTOS ) 30 MG tablet [id number]  Take 1 tablet (30 mg total) by mouth daily. Newlin, Enobong, MD  Active   sildenafil  (VIAGRA ) 100 MG tablet [id number]  Take 0.5-1 tablets (50-100 mg total) by mouth daily as needed. Watt Rush, MD  Active   tamsulosin  (FLOMAX ) 0.4 MG CAPS capsule [id number]  Take 1 capsule (0.4 mg total) by mouth daily. For urinary frequency. Fleming, Zelda  W, NP  Active             Recommendation:   PCP Follow-up Continue Current Plan of Care  Follow Up Plan:   Closing From:  Complex Care Management Patient has met all care management goals. Care Management case will be closed. Patient has been provided contact information should new needs arise.   Rosaline Finlay, RN MSN [place]  [facility] Population Health RN Care Manager Direct Dial: [phone]  Fax: [phone]

## 2024-04-24 NOTE — Patient Instructions (Signed)
 Visit Information  Thank you for taking time to visit with me today. Please don't hesitate to contact me if I can be of assistance to you before our next scheduled appointment.  Your next care management appointment is no further scheduled appointments.    Closing From: Complex Care Management. Patient has met all care management goals. Care Management case will be closed. Patient has been provided contact information should new needs arise.   Please call the care guide team at [phone] if you need to cancel, schedule, or reschedule an appointment.   Please call the Suicide and Crisis Lifeline: 988 call 1-800-273-TALK (toll free, 24 hour hotline) if you are experiencing a Mental Health or Behavioral Health Crisis or need someone to talk to.  Rosaline Finlay, RN MSN [place]  [facility] Population Health RN Care Manager Direct Dial: [phone]  Fax: [phone]

## 2024-04-25 NOTE — Congregational Nurse Program (Signed)
  Dept: [phone]   Congregational Nurse Program Note  Date of Encounter: 04/25/2024  Telephone call to previous [employer] per his request.  States that his Hover-round has not been repaired and he left message for care coordinator at MD office.  Follow-up message sent to care coordination at PCP office. Past Medical History: Past Medical History:  Diagnosis Date   Diabetes mellitus without complication (HCC)    Hypertension    Stroke [facility])     Encounter Details:  Community Questionnaire - 04/25/24 1656       Questionnaire   Ask client: Do you give verbal consent for me to treat you today? Yes    Student Assistance [employer]    Location Patient [employer]    Encounter Setting Phone/Text/Email    Population Status Unknown   has own apartment at [facility].   Insurance Medicare;Medicaid    Insurance/Financial Assistance Referral N/A    Medication N/A    Medical Provider Yes    Screening Referrals Made N/A    Medical Referrals Made N/A    Medical Appointment Completed N/A    CNP Interventions Advocate/Support;Counsel    Screenings CN Performed N/A    ED Visit Averted N/A    Life-Saving Intervention Made N/A

## 2024-05-01 ENCOUNTER — Other Ambulatory Visit: Payer: Self-pay

## 2024-05-02 ENCOUNTER — Other Ambulatory Visit: Payer: Self-pay

## 2024-05-03 ENCOUNTER — Ambulatory Visit: Admitting: Internal Medicine

## 2024-05-03 ENCOUNTER — Other Ambulatory Visit (HOSPITAL_COMMUNITY): Payer: Self-pay

## 2024-05-03 ENCOUNTER — Encounter: Payer: Self-pay | Admitting: Internal Medicine

## 2024-05-03 ENCOUNTER — Other Ambulatory Visit: Payer: Self-pay

## 2024-05-03 VITALS — BP 126/82 | HR 94 | Ht 67.0 in | Wt 288.0 lb

## 2024-05-03 DIAGNOSIS — E1142 Type 2 diabetes mellitus with diabetic polyneuropathy: Secondary | ICD-10-CM | POA: Diagnosis not present

## 2024-05-03 DIAGNOSIS — E1159 Type 2 diabetes mellitus with other circulatory complications: Secondary | ICD-10-CM

## 2024-05-03 DIAGNOSIS — N1832 Chronic kidney disease, stage 3b: Secondary | ICD-10-CM

## 2024-05-03 DIAGNOSIS — Z794 Long term (current) use of insulin: Secondary | ICD-10-CM

## 2024-05-03 DIAGNOSIS — E1122 Type 2 diabetes mellitus with diabetic chronic kidney disease: Secondary | ICD-10-CM

## 2024-05-03 LAB — POCT GLYCOSYLATED HEMOGLOBIN (HGB A1C): Hemoglobin A1C: 6.6 % — AB (ref 4.0–5.6)

## 2024-05-03 MED ORDER — PIOGLITAZONE HCL 30 MG PO TABS
30.0000 mg | ORAL_TABLET | Freq: Every day | ORAL | 3 refills | Status: AC
  Filled 2024-05-03: qty 90, 90d supply, fill #0
  Filled 2024-05-29: qty 30, 30d supply, fill #0
  Filled 2024-07-06: qty 30, 30d supply, fill #1
  Filled 2024-08-04: qty 30, 30d supply, fill #2

## 2024-05-03 MED ORDER — DAPAGLIFLOZIN PROPANEDIOL 10 MG PO TABS
10.0000 mg | ORAL_TABLET | Freq: Every day | ORAL | 3 refills | Status: DC
  Filled 2024-05-03: qty 90, 90d supply, fill #0

## 2024-05-03 MED ORDER — TRESIBA FLEXTOUCH 200 UNIT/ML ~~LOC~~ SOPN
56.0000 [IU] | PEN_INJECTOR | Freq: Every day | SUBCUTANEOUS | 3 refills | Status: AC
  Filled 2024-05-03: qty 9, 32d supply, fill #0
  Filled 2024-05-03: qty 24, 85d supply, fill #0
  Filled 2024-05-03: qty 45, 160d supply, fill #0

## 2024-05-03 MED ORDER — NOVOLOG FLEXPEN 100 UNIT/ML ~~LOC~~ SOPN
PEN_INJECTOR | SUBCUTANEOUS | 3 refills | Status: AC
  Filled 2024-05-03: qty 45, 33d supply, fill #0
  Filled 2024-05-03: qty 45, 30d supply, fill #0
  Filled 2024-05-03: qty 36, 80d supply, fill #0

## 2024-05-03 NOTE — Patient Instructions (Addendum)
-   A1c 6.6% , Keep UP the Good Work !   - Continue  Pioglitazone  (Actos ) 30 mg daily  - Continue farxiga  10 tablet every morning  - Decrease Tresiba  56 units once daily  - Decrease Novolog  22 units with each meal  - Novolog  correctional insulin : ADD extra units on insulin  to your meal-time Novolog   dose if your blood sugars are higher than 160. Use the scale below to help guide you:   Blood sugar before meal Number of units to inject  Less than 160 0 unit  161 -  190 1 units  191 -  220 2 units  221 -  250 3 units  251 -  280 4 units  281 -  310 5 units  311 -  340 6 units        HOW TO TREAT LOW BLOOD SUGARS (Blood sugar LESS THAN 70 MG/DL) Please follow the RULE OF 15 for the treatment of hypoglycemia treatment (when your (blood sugars are less than 70 mg/dL)   STEP 1: Take 15 grams of carbohydrates when your blood sugar is low, which includes:  3-4 GLUCOSE TABS  OR 3-4 OZ OF JUICE OR REGULAR SODA OR ONE TUBE OF GLUCOSE GEL    STEP 2: RECHECK blood sugar in 15 MINUTES STEP 3: If your blood sugar is still low at the 15 minute recheck --> then, go back to STEP 1 and treat AGAIN with another 15 grams of carbohydrates.

## 2024-05-03 NOTE — Progress Notes (Signed)
 Name: Reginald Rodriguez  MRN/ DOB: [id number], 1950-09-18   Age/ Sex: 73 y.o., male    PCP: Theotis Haze ORN, NP   Reason for Endocrinology Evaluation: Type 2 Diabetes Mellitus     Date of Initial Endocrinology Visit: 03/12/2021    PATIENT IDENTIFIER: Reginald Rodriguez is a 73 y.o. male with a past medical history of DM, HTN , COPD , Hx of cocaine abuse and Dyslipidemia. The patient presented for initial endocrinology clinic visit on 03/12/2021 for consultative assistance with his diabetes management.     DIABETIC HISTORY:  Reginald Rodriguez was diagnosed with DM many years ago. His hemoglobin A1c has ranged from 7.7%, peaking at 9.7% in 2021  He is intolerant to higher doses of Trulicity    On his initial visit to our clinic his A1c was 9.2 % we started farxiga , continued Trulicity  and Basal/ Prandial regimen   Trulicity  was stopped 09/2021 as he could only tolerate the smallest dose and we opted to start pioglitazone   SUBJECTIVE:   During the last visit (11/02/2023): A1c 6.6%    Today (05/03/24): Reginald Rodriguez is here for a follow up on diabetes management.   He checks his sugars blood sugars multiple times daily through CGM. The patient has had hypoglycemic episodes since the last clinic visit.He is symptomatic with these episodes.  Patient follows with urology for erectile dysfunction due to arterial insufficiency He was seen by podiatry 10/15/2023 Patient presented to the ED last month for back pain He was evaluated by cardiothoracic surgery for aortic aneurysm that was 4 cm on CT scan July, 2025.  Patient does not meet surgical criteria of >5.5 cm   No nausea or vomiting  No constipation or diarrhea    HOME DIABETES REGIMEN: Tresiba  60  units daily at night  Novolog  26 units with breakfast Pioglitazone  30 mg daily  Farxiga  10 mg daily  CF: Novolog  (BG-130/30) TIDQAC   Statin: yes ACE-I/ARB: yes   CONTINUOUS GLUCOSE MONITORING RECORD INTERPRETATION    Dates of Recording:  10/2-10/15/2025  Sensor description:freestyle libre  Results statistics:   CGM use % of time 73  Average and SD 113/25.9  Time in range 95 %  % Time Above 180 2  % Time above 250 0  % Time Below target 3   Glycemic patterns summary: BGs are optimal throughout the day and night Hyperglycemic episodes  postprandial   Hypoglycemic episodes occurred after breakfast  Overnight periods:optimal   DIABETIC COMPLICATIONS: Microvascular complications:  CKD III, neuropathy  Denies: retinopathy  Last eye exam: Completed 10/14/2021  Macrovascular complications:  CVA ( Right hemiparesis )  Denies: CAD, PVD   PAST HISTORY: Past Medical History:  Past Medical History:  Diagnosis Date   Diabetes mellitus without complication (HCC)    Hypertension    Stroke [facility])    Past Surgical History:  Past Surgical History:  Procedure Laterality Date   ABDOMINAL AORTIC ENDOVASCULAR STENT GRAFT N/A 07/18/2023   Procedure: ABDOMINAL AORTIC ENDOVASCULAR STENT GRAFT;  Surgeon: Magda Debby SAILOR, MD;  Location: [facility] OR;  Service: Vascular;  Laterality: N/A;   ULTRASOUND GUIDANCE FOR VASCULAR ACCESS Bilateral 07/18/2023   Procedure: ULTRASOUND GUIDANCE FOR VASCULAR ACCESS;  Surgeon: Magda Debby SAILOR, MD;  Location: [facility] OR;  Service: Vascular;  Laterality: Bilateral;    Social History:  reports that he has been smoking cigarettes. He has a 46 pack-year smoking history. He has quit using smokeless tobacco. He reports that he does not currently use alcohol. He reports that he does  not currently use drugs. Family History: No family history on file.   HOME MEDICATIONS: Allergies as of 05/03/2024       Reactions   Bee Venom Swelling   Penicillins Rash   Quinolones Other (See Comments)   Ascending thoracic aortic aneurysm         Medication List        Accurate as of May 03, 2024  1:16 PM. If you have any questions, ask your nurse or doctor.          Accu-Chek Softclix Lancets  lancets Use to check blood sugar 3 times daily.   albuterol  108 (90 Base) MCG/ACT inhaler Commonly known as: VENTOLIN  HFA Inhale 2 puffs into the lungs every 6 (six) hours as needed for wheezing or shortness of breath.   amLODipine  5 MG tablet Commonly known as: NORVASC  Take 1 tablet (5 mg total) by mouth daily.   ammonium lactate  12 % cream Commonly known as: AMLACTIN Apply 1 Application topically as needed for dry skin.   Aspirin  Low Dose 81 MG tablet Generic drug: aspirin  EC Take 1 tablet (81 mg total) by mouth daily.   atorvastatin  10 MG tablet Commonly known as: LIPITOR Take 1 tablet (10 mg total) by mouth daily.   chlorthalidone  25 MG tablet Commonly known as: HYGROTON  TAKE 1 TABLET EVERY DAY   dapagliflozin  propanediol 10 MG Tabs tablet Commonly known as: Farxiga  Take 1 tablet (10 mg total) by mouth daily before breakfast.   fenofibrate  micronized 134 MG capsule Commonly known as: LOFIBRA Take 1 capsule (134 mg total) by mouth daily before breakfast.   fluticasone -salmeterol 100-50 MCG/ACT Aepb Commonly known as: ADVAIR  Inhale 1 puff into the lungs 2 (two) times daily.   FreeStyle Libre 2 Sensor Misc 1 each by Does not apply route every 14 (fourteen) days.   gabapentin  100 MG capsule Commonly known as: NEURONTIN  Take 1 capsule (100 mg total) by mouth 2 (two) times daily.   lisinopril  2.5 MG tablet Commonly known as: ZESTRIL  Take 1 tablet (2.5 mg total) by mouth daily.   nicotine  21 mg/24hr patch Commonly known as: NICODERM CQ  - dosed in mg/24 hours Place 1 patch (21 mg total) onto the skin daily.   NovoLOG  FlexPen 100 UNIT/ML FlexPen Generic drug: insulin  aspart Inject a max daily dose of 45 units What changed: additional instructions Changed by: Donell PARAS Arabell Neria   pioglitazone  30 MG tablet Commonly known as: Actos  Take 1 tablet (30 mg total) by mouth daily.   sildenafil  100 MG tablet Commonly known as: VIAGRA  Take 0.5-1 tablets (50-100 mg  total) by mouth daily as needed.   tamsulosin  0.4 MG Caps capsule Commonly known as: FLOMAX  Take 1 capsule (0.4 mg total) by mouth daily. For urinary frequency.   Tresiba  FlexTouch 200 UNIT/ML FlexTouch Pen Generic drug: insulin  degludec Inject 56 Units into the skin daily in the afternoon. What changed: how much to take Changed by: Donell PARAS Elody Kleinsasser         ALLERGIES: Allergies  Allergen Reactions   Bee Venom Swelling   Penicillins Rash   Quinolones Other (See Comments)    Ascending thoracic aortic aneurysm      OBJECTIVE:   VITAL SIGNS: BP 126/82 (BP Location: Left Arm, Patient Position: Sitting, Cuff Size: Normal)   Pulse 94   Ht 5' 7 (1.702 m)   Wt 288 lb (130.6 kg)   SpO2 95%   BMI 45.11 kg/m    PHYSICAL EXAM:  General: Pt appears well and  is in NAD with a walker  Lungs: Clear with good BS bilat   Heart: RRR  Extremities:  Lower extremities - trace  pretibial edema.  Neuro: MS is good with appropriate affect, pt is alert and Ox3    DM Foot Exam 10/12/2023 per podiatry    DATA REVIEWED:  Lab Results  Component Value Date   HGBA1C 6.6 (A) 05/03/2024   HGBA1C 6.6 (A) 11/02/2023   HGBA1C 6.8 (A) 05/04/2023      Latest Reference Range & Units 04/02/24 18:49  Sodium 135 - 145 mmol/L 135  Potassium 3.5 - 5.1 mmol/L 3.9  Chloride 98 - 111 mmol/L 97 (L)  CO2 22 - 32 mmol/L 27  Glucose 70 - 99 mg/dL 98  BUN 8 - 23 mg/dL 26 (H)  Creatinine 9.38 - 1.24 mg/dL 8.07 (H)  Calcium  8.9 - 10.3 mg/dL 9.6  Anion gap 5 - 15  11  Alkaline Phosphatase 38 - 126 U/L 41  Albumin  3.5 - 5.0 g/dL 3.8  AST 15 - 41 U/L 26  ALT 0 - 44 U/L 20  Total Protein 6.5 - 8.1 g/dL 6.9  Total Bilirubin 0.0 - 1.2 mg/dL 0.8  GFR, Estimated >39 mL/min 37 (L)      ASSESSMENT / PLAN / RECOMMENDATIONS:   1) Type 2 Diabetes Mellitus, Optimally controlled , With Neuropathic, CKD III and macrovascular  complications - Most recent A1c of 6.6 %. Goal A1c < 7.0 %.    -A1c  continues to be optimal - He developed severe nausea to a small dose of trulicity  in the past and has declined trying GLP-1 agonist - He currently takes NovoLog  with breakfast only, in reviewing freestyle libre, the patient has been noted with hypoglycemia after breakfast, I will decrease NovoLog  with breakfast, he has also been noted with tight BG's overnight I will also decrease his basal insulin  as below   MEDICATIONS:   Continue pioglitazone  30 mg daily Continue farxiga   10 tablet every morning  Decrease Tresiba  56 units once daily  Decrease Novolog  22 units with Breakfast only CF: Novolog  (BG -130/30) TID QAC    EDUCATION / INSTRUCTIONS: BG monitoring instructions: Patient is instructed to check his blood sugars 3 times a day, before meals. Call [place] [facility] if: BG persistently < 70  I reviewed the Rule of 15 for the treatment of hypoglycemia in detail with the patient. Literature supplied.   2) Diabetic complications:  Eye: Does not have known diabetic retinopathy.  Neuro/ Feet: Does  have known diabetic peripheral neuropathy. Renal: Patient does  have known baseline CKD. He is  on an ACEI/ARB at present.     F/U in 6 months     Signed electronically by: Stefano Redgie Butts, MD  [facility] Endocrinology  [facility] [address] [place] [place], [place] [address] Phone: [phone] FAX: [phone]   CC: Theotis Haze ORN, NP [address] [place] [address] Phone: [phone]  Fax: [phone]    Return to Endocrinology clinic as below: Future Appointments  Date Time Provider Department Center  06/27/2024  1:30 PM Theotis Haze ORN, NP [facility] [place] [facility]  02/27/2025  9:10 AM [facility] ANNUAL WELLNESS VISIT [facility] [place]

## 2024-05-24 NOTE — Telephone Encounter (Signed)
 I called [employer] to check on the status of the order for the power wheelchair.  After being transferred to multiple departments, I spoke to a representative from the Complex Rehab Team scheduling : [phone] who said that the next step for this order to to send a tech to the home to measure the home and evaluate the patient for an appropriate chair.  She said that the insurance requires this and she did not have a time frame of when this evaluation would take place.  She went on to explain that they then need to re-submit the order to the insurance company after the home evaluation and they do not know how long it will take for the patient to receive a new chair if approved.    I called the patient and explained my conversation with [employer] and he was very appreciative of the update.

## 2024-05-29 ENCOUNTER — Other Ambulatory Visit: Payer: Self-pay

## 2024-05-30 ENCOUNTER — Other Ambulatory Visit: Payer: Self-pay

## 2024-06-01 ENCOUNTER — Other Ambulatory Visit: Payer: Self-pay

## 2024-06-02 ENCOUNTER — Other Ambulatory Visit: Payer: Self-pay

## 2024-06-06 ENCOUNTER — Other Ambulatory Visit: Payer: Self-pay

## 2024-06-06 ENCOUNTER — Telehealth: Payer: Self-pay | Admitting: Nurse Practitioner

## 2024-06-06 NOTE — Telephone Encounter (Signed)
 I called patient and he explained that he has not heard from [facility] yet and it has been 3 week since we last spoke.  I told him that I will call [employer] and check on the status of the order.

## 2024-06-06 NOTE — Telephone Encounter (Signed)
 Copied from CRM [id number]. Topic: General - Other >> Jun 06, 2024  9:31 AM Victoria B wrote:  Reason for CRM: Patient called in,asked to speak with Cy netter say why please cb

## 2024-06-06 NOTE — Telephone Encounter (Signed)
 I called [employer]: [phone] and was told that they have been trying to reach the patient to schedule the in-home assessment for 06/21/2024.  I then determined they have the wrong phone number for the patient.  I provided his current phone number and was told they would reach out to him this afternoon.  I then called the patient back and provided him with an update.

## 2024-06-13 ENCOUNTER — Other Ambulatory Visit: Payer: Self-pay

## 2024-06-14 ENCOUNTER — Other Ambulatory Visit: Payer: Self-pay

## 2024-06-21 DIAGNOSIS — I69359 Hemiplegia and hemiparesis following cerebral infarction affecting unspecified side: Secondary | ICD-10-CM | POA: Diagnosis not present

## 2024-06-21 DIAGNOSIS — I679 Cerebrovascular disease, unspecified: Secondary | ICD-10-CM | POA: Diagnosis not present

## 2024-06-21 DIAGNOSIS — J449 Chronic obstructive pulmonary disease, unspecified: Secondary | ICD-10-CM | POA: Diagnosis not present

## 2024-06-21 NOTE — Telephone Encounter (Signed)
 I called patient back and he said that the [facility] technician came out today to evaluate his power wheelchair needs.  He said that the tech told him that they are going to request an upgrade in his chair from the insurance company. They did not provide him with an estimate of when he might receive the new chair; but he was very pleased that the tech finally came to see him.

## 2024-06-21 NOTE — Telephone Encounter (Signed)
 Copied from CRM #[id number]. Topic: General - Other >> Jun 21, 2024  3:13 PM Rosaria E wrote:  Reason for CRM: Pt called requesting to speak to Slater regarding his Hov-a-round.   Best contact: [phone]

## 2024-06-27 ENCOUNTER — Ambulatory Visit: Attending: Nurse Practitioner | Admitting: Nurse Practitioner

## 2024-06-27 ENCOUNTER — Other Ambulatory Visit: Payer: Self-pay

## 2024-06-27 ENCOUNTER — Encounter: Payer: Self-pay | Admitting: Nurse Practitioner

## 2024-06-27 VITALS — BP 110/75 | HR 97 | Resp 18 | Ht 67.0 in | Wt 289.2 lb

## 2024-06-27 DIAGNOSIS — I10 Essential (primary) hypertension: Secondary | ICD-10-CM | POA: Diagnosis not present

## 2024-06-27 DIAGNOSIS — Z23 Encounter for immunization: Secondary | ICD-10-CM | POA: Diagnosis not present

## 2024-06-27 DIAGNOSIS — Z794 Long term (current) use of insulin: Secondary | ICD-10-CM | POA: Diagnosis not present

## 2024-06-27 DIAGNOSIS — E1142 Type 2 diabetes mellitus with diabetic polyneuropathy: Secondary | ICD-10-CM

## 2024-06-27 MED ORDER — FREESTYLE LIBRE 2 SENSOR MISC
1.0000 | 99 refills | Status: DC
  Filled 2024-06-27 – 2024-07-26 (×4): qty 2, 28d supply, fill #0

## 2024-06-27 NOTE — Addendum Note (Signed)
 Addended by: LIANE JACKOLYN GAILS on: 06/27/2024 02:02 PM   Modules accepted: Orders

## 2024-06-27 NOTE — Progress Notes (Signed)
 Assessment & Plan:  Reginald Rodriguez was seen today for hypertension.  Diagnoses and all orders for this visit:  Primary hypertension -     CMP14+EGFR Continue all antihypertensives as prescribed.  Reminded to bring in blood pressure log for follow  up appointment.  RECOMMENDATIONS: DASH/Mediterranean Diets are healthier choices for HTN.    Type 2 diabetes mellitus with diabetic polyneuropathy, with long-term current use of insulin  (HCC) -     Continuous Glucose Sensor (FREESTYLE LIBRE 2 SENSOR) MISC; 1 each by Does not apply route every 14 (fourteen) days. Continue blood sugar control as discussed in office today, low carbohydrate diet, and regular physical exercise as tolerated, 150 minutes per week (30 min each day, 5 days per week, or 50 min 3 days per week). Keep blood sugar logs with fasting goal of 90-130 mg/dl, post prandial (after you eat) less than 180.  For Hypoglycemia: BS <60 and Hyperglycemia BS >400; contact the clinic ASAP. Annual eye exams and foot exams are recommended.     Patient has been counseled on age-appropriate routine health concerns for screening and prevention. These are reviewed and up-to-date. Referrals have been placed accordingly. Immunizations are up-to-date or declined.    Subjective:   Chief Complaint  Patient presents with   Hypertension    Reginald Rodriguez 73 y.o. 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 He is currently taking amlodipine  5 mg daily, chlorthalidone  25 mg daily,  and lisinopril  2.5 mg daily.  BP Readings from Last 3 Encounters:  06/27/24 110/75  05/03/24 126/82  04/04/24 119/83     Review of Systems  Constitutional:  Negative for fever, malaise/fatigue and weight loss.  HENT:  Negative.  Negative for nosebleeds.   Eyes: Negative.  Negative for blurred vision, double vision and photophobia.  Respiratory: Negative.  Negative for cough and shortness of breath.   Cardiovascular: Negative.  Negative for chest pain, palpitations and leg swelling.  Gastrointestinal: Negative.  Negative for heartburn, nausea and vomiting.  Musculoskeletal: Negative.  Negative for myalgias.  Neurological: Negative.  Negative for dizziness, focal weakness, seizures and headaches.  Psychiatric/Behavioral: Negative.  Negative for suicidal ideas.     Past Medical History:  Diagnosis Date   Diabetes mellitus without complication (HCC)    Hypertension    Stroke [facility])     Past Surgical History:  Procedure Laterality Date   ABDOMINAL AORTIC ENDOVASCULAR STENT GRAFT N/A 07/18/2023   Procedure: ABDOMINAL AORTIC ENDOVASCULAR STENT GRAFT;  Surgeon: Magda Debby SAILOR, MD;  Location: [facility] OR;  Service: Vascular;  Laterality: N/A;   ULTRASOUND GUIDANCE FOR VASCULAR ACCESS Bilateral 07/18/2023   Procedure: ULTRASOUND GUIDANCE FOR VASCULAR ACCESS;  Surgeon: Magda Debby SAILOR, MD;  Location: [facility] OR;  Service: Vascular;  Laterality: Bilateral;    No family history on file.  Social History Reviewed with no changes to be made today.   Outpatient Medications Prior to Visit  Medication Sig Dispense Refill   Accu-Chek Softclix Lancets lancets Use to check blood sugar 3 times daily. 100 each 2   albuterol  (VENTOLIN  HFA) 108 (90 Base) MCG/ACT inhaler Inhale 2 puffs into the lungs every 6 (six) hours as needed for wheezing or shortness of breath. 6.7 g 1   amLODipine  (NORVASC ) 5 MG tablet Take 1  tablet (5 mg total) by mouth daily. 90 tablet 3   ammonium lactate  (AMLACTIN) 12 % cream Apply 1 Application topically as needed for dry skin. 385 g 5   aspirin  EC (ASPIRIN  LOW DOSE) 81 MG tablet Take 1 tablet (81 mg total) by mouth daily. 100 tablet 1   atorvastatin  (LIPITOR) 10 MG tablet Take 1 tablet (10 mg total)  by mouth daily. 90 tablet 3   chlorthalidone  (HYGROTON ) 25 MG tablet TAKE 1 TABLET EVERY DAY 90 tablet 2   dapagliflozin  propanediol (FARXIGA ) 10 MG TABS tablet Take 1 tablet (10 mg total) by mouth daily before breakfast. 90 tablet 3   fenofibrate  micronized (LOFIBRA) 134 MG capsule Take 1 capsule (134 mg total) by mouth daily before breakfast. 90 capsule 3   fluticasone -salmeterol (ADVAIR ) 100-50 MCG/ACT AEPB Inhale 1 puff into the lungs 2 (two) times daily. 60 each 6   gabapentin  (NEURONTIN ) 100 MG capsule Take 1 capsule (100 mg total) by mouth 2 (two) times daily. 180 capsule 1   insulin  aspart (NOVOLOG  FLEXPEN) 100 UNIT/ML FlexPen Inject a max daily dose of 45 units 45 mL 3   insulin  degludec (TRESIBA  FLEXTOUCH) 200 UNIT/ML FlexTouch Pen Inject 56 Units into the skin daily in the afternoon. 45 mL 3   lisinopril  (ZESTRIL ) 2.5 MG tablet Take 1 tablet (2.5 mg total) by mouth daily. 90 tablet 3   pioglitazone  (ACTOS ) 30 MG tablet Take 1 tablet (30 mg total) by mouth daily. 90 tablet 3   pioglitazone  (ACTOS ) 30 MG tablet Take 1 tablet (30 mg total) by mouth daily. 90 tablet 3   sildenafil  (VIAGRA ) 100 MG tablet Take 0.5-1 tablets (50-100 mg total) by mouth daily as needed. 30 tablet 5   tamsulosin  (FLOMAX ) 0.4 MG CAPS capsule Take 1 capsule (0.4 mg total) by mouth daily. For urinary frequency. 30 capsule 3   Continuous Glucose Sensor (FREESTYLE LIBRE 2 SENSOR) MISC 1 each by Does not apply route every 14 (fourteen) days. 2 each PRN   dapagliflozin  propanediol (FARXIGA ) 10 MG TABS tablet Take 1 tablet (10 mg total) by mouth daily before breakfast. (Patient not taking: Reported on 06/27/2024) 90 tablet 3   Facility-Administered Medications Prior to Visit  Medication Dose Route Frequency Provider Last Rate Last Admin   glucose chewable tablet 4 g  1 tablet Oral Once Brien Belvie BRAVO, MD        Allergies  Allergen Reactions   Bee Venom Swelling   Penicillins Rash   Quinolones Other (See Comments)     Ascending thoracic aortic aneurysm        Objective:    BP 110/75 (BP Location: Left Arm, Patient Position: Sitting, Cuff Size: Normal)   Pulse 97   Resp 18   Ht 5' 7 (1.702 m)   Wt 289 lb 3.2 oz (131.2 kg)   SpO2 97%   BMI 45.30 kg/m  Wt Readings from Last 3 Encounters:  06/27/24 289 lb 3.2 oz (131.2 kg)  05/03/24 288 lb (130.6 kg)  04/04/24 285 lb (129.3 kg)    Physical Exam Vitals and nursing note reviewed.  Constitutional:      Appearance: He is well-developed.  HENT:     Head: Normocephalic and atraumatic.  Cardiovascular:     Rate and Rhythm: Normal rate and regular rhythm.     Heart sounds: Normal heart sounds. No murmur heard.    No friction rub. No gallop.  Pulmonary:     Effort: Pulmonary effort is normal. No tachypnea or  respiratory distress.     Breath sounds: Normal breath sounds. No decreased breath sounds, wheezing, rhonchi or rales.  Chest:     Chest wall: No tenderness.  Abdominal:     General: Bowel sounds are normal.     Palpations: Abdomen is soft.  Musculoskeletal:        General: Normal range of motion.     Cervical back: Normal range of motion.  Skin:    General: Skin is warm and dry.  Neurological:     Mental Status: He is alert and oriented to person, place, and time.     Coordination: Coordination normal.  Psychiatric:        Behavior: Behavior normal. Behavior is cooperative.        Thought Content: Thought content normal.        Judgment: Judgment normal.          Patient has been counseled extensively about nutrition and exercise as well as the importance of adherence with medications and regular follow-up. The patient was given clear instructions to go to ER or return to medical center if symptoms don't improve, worsen or new problems develop. The patient verbalized understanding.   Follow-up: Return in about 4 months (around 10/26/2024).   Haze LELON Servant, FNP-BC [facility]  [place], [place] [phone]   06/27/2024, 1:58 PM

## 2024-06-27 NOTE — Telephone Encounter (Signed)
 I met with the patient when he was in the clinic today.  He informed me that a technician from [facility] is coming to his home on Thurs, 12/ 05/2024 to repair his powerchair.  He said he is not sure if they are still going to pursue obtaining a new chair for him.

## 2024-06-28 LAB — CMP14+EGFR
ALT: 22 IU/L (ref 0–44)
AST: 27 IU/L (ref 0–40)
Albumin: 4.7 g/dL (ref 3.8–4.8)
Alkaline Phosphatase: 43 IU/L — ABNORMAL LOW (ref 47–123)
BUN/Creatinine Ratio: 15 (ref 10–24)
BUN: 29 mg/dL — ABNORMAL HIGH (ref 8–27)
Bilirubin Total: 0.3 mg/dL (ref 0.0–1.2)
CO2: 26 mmol/L (ref 20–29)
Calcium: 10.7 mg/dL — ABNORMAL HIGH (ref 8.6–10.2)
Chloride: 100 mmol/L (ref 96–106)
Creatinine, Ser: 1.88 mg/dL — ABNORMAL HIGH (ref 0.76–1.27)
Globulin, Total: 2.7 g/dL (ref 1.5–4.5)
Glucose: 73 mg/dL (ref 70–99)
Potassium: 4 mmol/L (ref 3.5–5.2)
Sodium: 140 mmol/L (ref 134–144)
Total Protein: 7.4 g/dL (ref 6.0–8.5)
eGFR: 38 mL/min/1.73 — ABNORMAL LOW (ref >59)

## 2024-07-03 ENCOUNTER — Ambulatory Visit: Payer: Self-pay | Admitting: Nurse Practitioner

## 2024-07-04 ENCOUNTER — Telehealth: Payer: Self-pay | Admitting: Nurse Practitioner

## 2024-07-04 NOTE — Telephone Encounter (Signed)
 Paperwork takes 5-10 business days.

## 2024-07-04 NOTE — Telephone Encounter (Signed)
 Copied from CRM #[id number]. Topic: General - Other >> Jul 04, 2024 12:59 PM Amber H wrote:  Reason for CRM: Nat from [facility] [employer] called and stated she faxed over a request for patient to have a power wheelchair. I did advise that fax was received yesterday. Nat just wanted to know when it would be complete.    [phone]

## 2024-07-04 NOTE — Telephone Encounter (Signed)
 Fax received from [facility] [employer]. Paperwork placed in PCP mailbox.

## 2024-07-05 NOTE — Telephone Encounter (Signed)
 Call received from the patient. He explained that the tech from [facility] came out to see him last week and assessed the power chair and told him that the transmission is faulty and would have to be replaced.  The patient was very pleased that the problems with the power chair are being addressed but he said the tech did not mention anything about a new power chair

## 2024-07-06 ENCOUNTER — Other Ambulatory Visit (HOSPITAL_COMMUNITY): Payer: Self-pay

## 2024-07-11 ENCOUNTER — Other Ambulatory Visit: Payer: Self-pay | Admitting: Nurse Practitioner

## 2024-07-11 ENCOUNTER — Other Ambulatory Visit (HOSPITAL_COMMUNITY): Payer: Self-pay

## 2024-07-11 ENCOUNTER — Other Ambulatory Visit: Payer: Self-pay

## 2024-07-11 DIAGNOSIS — M545 Low back pain, unspecified: Secondary | ICD-10-CM

## 2024-07-11 DIAGNOSIS — N1832 Chronic kidney disease, stage 3b: Secondary | ICD-10-CM

## 2024-07-11 DIAGNOSIS — N401 Enlarged prostate with lower urinary tract symptoms: Secondary | ICD-10-CM

## 2024-07-11 NOTE — Telephone Encounter (Signed)
 Copied from CRM [id number]. Topic: Clinical - Medication Refill >> Jul 11, 2024  8:07 AM Donna E wrote: Medication:  amLODipine  (NORVASC ) 5 MG tablet  atorvastatin  (LIPITOR) 10 MG tablet chlorthalidone  (HYGROTON ) 25 MG tablet dapagliflozin  propanediol (FARXIGA ) 10 MG TABS tablet fenofibrate  micronized (LOFIBRA) 134 MG capsule gabapentin  (NEURONTIN ) 100 MG capsule lisinopril  (ZESTRIL ) 2.5 MG tablet pioglitazone  (ACTOS ) 30 MG tablet sildenafil  (VIAGRA ) 100 MG tablet tamsulosin  (FLOMAX ) 0.4 MG CAPS capsule   Has the patient contacted their pharmacy? No Patient calling us    This is the patient's preferred pharmacy:    [facility] [address] [place] [address] Phone: [phone] Fax: [phone]   Is this the correct pharmacy for this prescription? Yes If no, delete pharmacy and type the correct one.   Has the prescription been filled recently? Yes  Is the patient out of the medication? Yes  Has the patient been seen for an appointment in the last year OR does the patient have an upcoming appointment? Yes  Can we respond through MyChart? No  Agent: Please be advised that Rx refills may take up to 3 business days. We ask that you follow-up with your pharmacy.

## 2024-07-12 ENCOUNTER — Telehealth: Payer: Self-pay | Admitting: Nurse Practitioner

## 2024-07-12 ENCOUNTER — Other Ambulatory Visit (HOSPITAL_COMMUNITY): Payer: Self-pay

## 2024-07-12 ENCOUNTER — Other Ambulatory Visit: Payer: Self-pay

## 2024-07-12 MED ORDER — TAMSULOSIN HCL 0.4 MG PO CAPS
0.4000 mg | ORAL_CAPSULE | Freq: Every day | ORAL | 2 refills | Status: AC
  Filled 2024-08-04: qty 30, 30d supply, fill #0

## 2024-07-12 MED ORDER — SILDENAFIL CITRATE 100 MG PO TABS
50.0000 mg | ORAL_TABLET | Freq: Every day | ORAL | 2 refills | Status: AC | PRN
  Filled 2024-07-12: qty 30, 30d supply, fill #0

## 2024-07-12 MED ORDER — GABAPENTIN 100 MG PO CAPS
100.0000 mg | ORAL_CAPSULE | Freq: Two times a day (BID) | ORAL | 0 refills | Status: AC
  Filled 2024-08-04: qty 60, 30d supply, fill #0

## 2024-07-12 NOTE — Telephone Encounter (Signed)
 Most all denied Rx were written 10/11/23 #90 3RF- 1 year supply- patient should have refills.  Requested Prescriptions  Pending Prescriptions Disp Refills   gabapentin  (NEURONTIN ) 100 MG capsule 180 capsule 0    Sig: Take 1 capsule (100 mg total) by mouth 2 (two) times daily.     Neurology: Anticonvulsants - gabapentin  Failed - 07/12/2024  2:52 PM      Failed - Cr in normal range and within 360 days    Creatinine, Ser  Date Value Ref Range Status  06/27/2024 1.88 (H) 0.76 - 1.27 mg/dL Final         Passed - Completed PHQ-2 or PHQ-9 in the last 360 days      Passed - Valid encounter within last 12 months    Recent Outpatient Visits           2 weeks ago Primary hypertension   [facility] - A Dept Of [place]. [facility] Theotis Haze ORN, NP   3 months ago Primary hypertension   [facility] [place] - A Dept Of [place]. [facility] Theotis Haze ORN, NP   6 months ago Encounter for annual physical exam   [facility] [place] - A Dept Of [place]. [facility] Theotis Haze ORN, NP   8 months ago Encounter for medication review and counseling   [place] [facility] [place] - A Dept Of [place]. [facility] Fleeta Morris, [place] L, RPH-CPP   10 months ago Type 2 diabetes mellitus with stage 3b chronic kidney disease, with long-term current use of insulin  [facility])   [place] [place] [facility] - A Dept Of [place]. [facility], Jon M, PA-C               sildenafil  (VIAGRA ) 100 MG tablet 30 tablet 2    Sig: Take 0.5-1 tablets (50-100 mg total) by mouth daily as needed.     Urology: Erectile Dysfunction Agents Passed - 07/12/2024  2:52 PM      Passed - AST in normal range and within 360 days    AST  Date Value Ref Range Status  06/27/2024 27 0 - 40 IU/L Final         Passed - ALT in normal range and within 360 days    ALT  Date Value Ref Range Status  06/27/2024 22 0 -  44 IU/L Final         Passed - Last BP in normal range    BP Readings from Last 1 Encounters:  06/27/24 110/75         Passed - Valid encounter within last 12 months    Recent Outpatient Visits           2 weeks ago Primary hypertension   [facility] - [facility] Dept [facility] [place]. [facility] Theotis Haze ORN, NP   3 months ago Primary hypertension   [facility] [place] - A Dept Of [place]. [facility] Theotis Haze ORN, NP   6 months ago Encounter for annual physical exam   [place] [facility] [place] - A Dept Of [place]. [facility] Theotis Haze ORN, NP   8 months ago Encounter for medication review and counseling   [place] [facility] [place] - A Dept Of [place]. [facility] Fleeta Morris, [place] L, RPH-CPP   10  months ago Type 2 diabetes mellitus with stage 3b chronic kidney disease, with long-term current use of insulin  (HCC)   [place] [facility] - A Dept Of [place]. [facility], Jon M, PA-C               tamsulosin  (FLOMAX ) 0.4 MG CAPS capsule 30 capsule 2    Sig: Take 1 capsule (0.4 mg total) by mouth daily. For urinary frequency.     Urology: Alpha-Adrenergic Blocker Passed - 07/12/2024  2:52 PM      Passed - PSA in normal range and within 360 days    Prostate Specific Ag, Serum  Date Value Ref Range Status  03/28/2024 3.9 0.0 - 4.0 ng/mL Final    Comment:    Roche ECLIA methodology. According to the American Urological Association, Serum PSA should decrease and remain at undetectable levels after radical prostatectomy. The AUA defines biochemical recurrence as an initial PSA value 0.2 ng/mL or greater followed by a subsequent confirmatory PSA value 0.2 ng/mL or greater. Values obtained with different assay methods or kits cannot be used interchangeably. Results cannot be interpreted as absolute evidence of the presence or absence of malignant disease.           Passed - Last BP in normal range    BP Readings from Last 1 Encounters:  06/27/24 110/75         Passed - Valid encounter within last 12 months    Recent Outpatient Visits           2 weeks ago Primary hypertension   [place] [facility] [place] [place] - A Dept Of [place]. [facility] Theotis Haze ORN, NP   3 months ago Primary hypertension   [place] [facility] [place] - A Dept Of [place]. [facility] Theotis Haze ORN, NP   6 months ago Encounter for annual physical exam   [place] [facility] [place] - A Dept Of [place]. [facility] Theotis Haze ORN, NP   8 months ago Encounter for medication review and counseling   [place] [facility] [place] - A Dept Of [place]. [facility] Fleeta Morris, Tenstrike L, RPH-CPP   10 months ago Type 2 diabetes mellitus with stage 3b chronic kidney disease, with long-term current use of insulin  [facility])   [place] [facility] - A Dept Of [place]. [facility], Jon M, PA-C              Refused Prescriptions Disp Refills   amLODipine  (NORVASC ) 5 MG tablet 90 tablet 3    Sig: Take 1 tablet (5 mg total) by mouth daily.     Cardiovascular: Calcium  Channel Blockers 2 Passed - 07/12/2024  2:52 PM      Passed - Last BP in normal range    BP Readings from Last 1 Encounters:  06/27/24 110/75         Passed - Last Heart Rate in normal range    Pulse Readings from Last 1 Encounters:  06/27/24 97         Passed - Valid encounter within last 6 months    Recent Outpatient Visits           2 weeks ago Primary hypertension   [place] [facility] [place] - A Dept Of [place]. [facility] Theotis Haze ORN, NP   3 months ago Primary hypertension   [place] [facility]  [facility] Well[facility] - A Dept Of [place]. [facility] Theotis Haze ORN, NP   6 months ago Encounter for annual physical exam   [place] [facility] [place] - A Dept Of [place]. [facility] Theotis Haze ORN, NP   8 months ago Encounter for medication review and counseling   [place] [facility] [place] - A Dept Of [place]. [facility] Fleeta Morris, Bagley L, RPH-CPP   10 months ago Type 2 diabetes mellitus with stage 3b chronic kidney disease, with long-term current use of insulin  [facility])   [place] [facility] - A Dept Of [place]. [facility], Jon M, PA-C               atorvastatin  (LIPITOR) 10 MG tablet 90 tablet 3    Sig: Take 1 tablet (10 mg total) by mouth daily.     Cardiovascular:  Antilipid - Statins Failed - 07/12/2024  2:52 PM      Failed - Lipid Panel in normal range within the last 12 months    Cholesterol, Total  Date Value Ref Range Status  10/22/2022 133 100 - 199 mg/dL Final   LDL Chol Calc (NIH)  Date Value Ref Range Status  10/22/2022 71 0 - 99 mg/dL Final   HDL  Date Value Ref Range Status  10/22/2022 31 (L) >39 mg/dL Final   Triglycerides  Date Value Ref Range Status  10/22/2022 180 (H) 0 - 149 mg/dL Final         Passed - Patient is not pregnant      Passed - Valid encounter within last 12 months    Recent Outpatient Visits           2 weeks ago Primary hypertension   [place] [facility] [place] - A Dept Of [place]. [facility] Theotis Haze ORN, NP   3 months ago Primary hypertension   [place] [facility] [place] - A Dept Of [place]. [facility] Theotis Haze ORN, NP   6 months ago Encounter for annual physical exam   [place] [facility] [place] - A Dept Of [place]. [facility] Theotis Haze ORN, NP   8 months ago Encounter for medication review and counseling   [place] [facility] [place] [facility] - A Dept Of [place]. [facility] Fleeta Morris, Homer L, RPH-CPP   10 months ago Type 2 diabetes mellitus with stage 3b chronic kidney disease, with long-term current use of insulin  (HCC)   [place] [facility] - A  Dept Of [place]. [facility], Jon M, PA-C               chlorthalidone  (HYGROTON ) 25 MG tablet 90 tablet 2    Sig: Take 1 tablet (25 mg total) by mouth daily.     Cardiovascular: Diuretics - Thiazide Failed - 07/12/2024  2:52 PM      Failed - Cr in normal range and within 180 days    Creatinine, Ser  Date Value Ref Range Status  06/27/2024 1.88 (H) 0.76 - 1.27 mg/dL Final         Passed - K in normal range and within 180 days    Potassium  Date Value Ref Range Status  06/27/2024 4.0 3.5 - 5.2 mmol/L Final         Passed - Na in normal range and within 180 days  Sodium  Date Value Ref Range Status  06/27/2024 140 134 - 144 mmol/L Final         Passed - Last BP in normal range    BP Readings from Last 1 Encounters:  06/27/24 110/75         Passed - Valid encounter within last 6 months    Recent Outpatient Visits           2 weeks ago Primary hypertension   [place] [facility] [place] - A Dept Of [place]. [facility] Theotis Haze ORN, NP   3 months ago Primary hypertension   [place] [facility] [place] - A Dept Of [place]. [facility] Theotis Haze ORN, NP   6 months ago Encounter for annual physical exam   [place] [facility] [place] - A Dept Of [place]. [facility] Theotis Haze ORN, NP   8 months ago Encounter for medication review and counseling   [place] [facility] [place] - A Dept Of [place]. [facility] Fleeta Morris, Al[place] L, RPH-CPP   10 months ago Type 2 diabetes mellitus with stage 3b chronic kidney disease, with long-term current use of insulin  [facility])   [place] [facility] - A Dept Of [place]. [facility], Jon M, PA-C               dapagliflozin  propanediol (FARXIGA ) 10 MG TABS tablet 90 tablet 3    Sig: Take 1 tablet (10 mg total) by mouth daily before breakfast.     Endocrinology:  Diabetes - SGLT2 Inhibitors Failed - 07/12/2024   2:52 PM      Failed - Cr in normal range and within 360 days    Creatinine, Ser  Date Value Ref Range Status  06/27/2024 1.88 (H) 0.76 - 1.27 mg/dL Final         Failed - eGFR in normal range and within 360 days    GFR calc Af Amer  Date Value Ref Range Status  06/27/2020 65 >59 mL/min/1.73 Final    Comment:    **In accordance with recommendations from the NKF-ASN Task force,**   [facility] is in the process of updating its eGFR calculation to the   2021 CKD-EPI creatinine equation that estimates kidney function   without a race variable.    GFR, Estimated  Date Value Ref Range Status  04/02/2024 37 (L) >60 mL/min Final    Comment:    (NOTE) Calculated using the CKD-EPI Creatinine Equation (2021)    eGFR  Date Value Ref Range Status  06/27/2024 38 (L) >59 mL/min/1.73 Final         Passed - HBA1C is between 0 and 7.9 and within 180 days    Hemoglobin A1C  Date Value Ref Range Status  05/03/2024 6.6 (A) 4.0 - 5.6 % Final   HbA1c, POC (controlled diabetic range)  Date Value Ref Range Status  03/04/2023 7.5 (A) 0.0 - 7.0 % Final         Passed - Valid encounter within last 6 months    Recent Outpatient Visits           2 weeks ago Primary hypertension   [place] [facility] [place] - A Dept Of [place]. [facility] Theotis Haze ORN, NP   3 months ago Primary hypertension   [place] [facility] [place] - A Dept Of [place]. [facility] Theotis Haze ORN, [place]  6 months ago Encounter for annual physical exam   [place] [facility] [place] - A Dept Of [place]. [facility] Theotis Haze ORN, NP   8 months ago Encounter for medication review and counseling   [place] [facility] - A Dept Of [place]. [facility] Fleeta Morris, Seagraves L, RPH-CPP   10 months ago Type 2 diabetes mellitus with stage 3b chronic kidney disease, with long-term current use of insulin  [facility])   [place] [facility] - A Dept Of [place]  H. [facility], Jon M, PA-C               fenofibrate  micronized (LOFIBRA) 134 MG capsule 90 capsule 3    Sig: Take 1 capsule (134 mg total) by mouth daily before breakfast.     Cardiovascular:  Antilipid - Fibric Acid Derivatives Failed - 07/12/2024  2:52 PM      Failed - Cr in normal range and within 360 days    Creatinine, Ser  Date Value Ref Range Status  06/27/2024 1.88 (H) 0.76 - 1.27 mg/dL Final         Failed - Lipid Panel in normal range within the last 12 months    Cholesterol, Total  Date Value Ref Range Status  10/22/2022 133 100 - 199 mg/dL Final   LDL Chol Calc (NIH)  Date Value Ref Range Status  10/22/2022 71 0 - 99 mg/dL Final   HDL  Date Value Ref Range Status  10/22/2022 31 (L) >39 mg/dL Final   Triglycerides  Date Value Ref Range Status  10/22/2022 180 (H) 0 - 149 mg/dL Final         Passed - ALT in normal range and within 360 days    ALT  Date Value Ref Range Status  06/27/2024 22 0 - 44 IU/L Final         Passed - AST in normal range and within 360 days    AST  Date Value Ref Range Status  06/27/2024 27 0 - 40 IU/L Final         Passed - HGB in normal range and within 360 days    Hemoglobin  Date Value Ref Range Status  04/02/2024 14.7 13.0 - 17.0 g/dL Final  94/68/7977 84.1 13.0 - 17.7 g/dL Final         Passed - HCT in normal range and within 360 days    HCT  Date Value Ref Range Status  04/02/2024 45.8 39.0 - 52.0 % Final   Hematocrit  Date Value Ref Range Status  12/17/2020 47.9 37.5 - 51.0 % Final         Passed - PLT in normal range and within 360 days    Platelets  Date Value Ref Range Status  04/02/2024 268 150 - 400 K/uL Final  12/17/2020 295 150 - 450 x10E3/uL Final         Passed - WBC in normal range and within 360 days    WBC  Date Value Ref Range Status  04/02/2024 8.4 4.0 - 10.5 K/uL Final         Passed - eGFR is 30 or above and within 360 days    GFR calc Af Amer  Date Value  Ref Range Status  06/27/2020 65 >59 mL/min/1.73 Final    Comment:    **In accordance with recommendations from the NKF-ASN Task force,**   [facility] is in the process of updating its  eGFR calculation to the   2021 CKD-EPI creatinine equation that estimates kidney function   without a race variable.    GFR, Estimated  Date Value Ref Range Status  04/02/2024 37 (L) >60 mL/min Final    Comment:    (NOTE) Calculated using the CKD-EPI Creatinine Equation (2021)    eGFR  Date Value Ref Range Status  06/27/2024 38 (L) >59 mL/min/1.73 Final         Passed - Valid encounter within last 12 months    Recent Outpatient Visits           2 weeks ago Primary hypertension   [facility] [facility] [place] [facility] [place] - A Dept Of [place]. [facility] Theotis Haze ORN, NP   3 months ago Primary hypertension   [place] [facility] [place] - A Dept Of [place]. [facility] Theotis Haze ORN, NP   6 months ago Encounter for annual physical exam   [place] [facility] [place] - A Dept Of [place]. [facility] Theotis Haze ORN, NP   8 months ago Encounter for medication review and counseling   [place] [facility] [place] - A Dept Of [place]. [facility] Fleeta Morris, [place] L, RPH-CPP   10 months ago Type 2 diabetes mellitus with stage 3b chronic kidney disease, with long-term current use of insulin  (HCC)   [place] [facility] Well[facility] - A Dept Of [place]. [facility], Jon M, PA-C               lisinopril  (ZESTRIL ) 2.5 MG tablet 90 tablet 3    Sig: Take 1 tablet (2.5 mg total) by mouth daily.     Cardiovascular:  ACE Inhibitors Failed - 07/12/2024  2:52 PM      Failed - Cr in normal range and within 180 days    Creatinine, Ser  Date Value Ref Range Status  06/27/2024 1.88 (H) 0.76 - 1.27 mg/dL Final         Passed - K in normal range and within 180 days    Potassium  Date Value Ref Range Status  06/27/2024 4.0 3.5 - 5.2  mmol/L Final         Passed - Patient is not pregnant      Passed - Last BP in normal range    BP Readings from Last 1 Encounters:  06/27/24 110/75         Passed - Valid encounter within last 6 months    Recent Outpatient Visits           2 weeks ago Primary hypertension   [place] [facility] [place] - A Dept Of [place]. [facility] Theotis Haze ORN, NP   3 months ago Primary hypertension   [place] [facility] [place] - A Dept Of [place]. [facility] Theotis Haze ORN, NP   6 months ago Encounter for annual physical exam   [place] [facility] [place] - A Dept Of [place]. [facility] Theotis Haze ORN, NP   8 months ago Encounter for medication review and counseling   [place] [facility] [place] - A Dept Of [place]. [facility] Fleeta Morris, [place] L, RPH-CPP   10 months ago Type 2 diabetes mellitus with stage 3b chronic kidney disease, with long-term current use of insulin  [facility])   [place] [facility] Well[facility] - A Dept Of [place]. [facility]  [facility], Jon M, PA-C               pioglitazone  (ACTOS ) 30 MG tablet 90 tablet 3    Sig: Take 1 tablet (30 mg total) by mouth daily.     Endocrinology:  Diabetes - Glitazones - pioglitazone  Passed - 07/12/2024  2:52 PM      Passed - HBA1C is between 0 and 7.9 and within 180 days    Hemoglobin A1C  Date Value Ref Range Status  05/03/2024 6.6 (A) 4.0 - 5.6 % Final   HbA1c, POC (controlled diabetic range)  Date Value Ref Range Status  03/04/2023 7.5 (A) 0.0 - 7.0 % Final         Passed - Valid encounter within last 6 months    Recent Outpatient Visits           2 weeks ago Primary hypertension   [place] [facility] [place] - A Dept Of [place]. [facility] Theotis Haze ORN, NP   3 months ago Primary hypertension   [place] [facility] [place] - A Dept Of [place]. [facility] Theotis Haze ORN, NP   6 months ago Encounter for  annual physical exam   [place] [facility] [place] - A Dept Of [place]. [facility] Theotis Haze ORN, NP   8 months ago Encounter for medication review and counseling   [place] [facility] [place] - A Dept Of [place]. [facility] Fleeta Morris, Inez L, RPH-CPP   10 months ago Type 2 diabetes mellitus with stage 3b chronic kidney disease, with long-term current use of insulin  [facility])   [place] [facility] Well[facility] - A Dept Of [place]. [facility] Jerome, [place], PA-C

## 2024-07-12 NOTE — Telephone Encounter (Signed)
 Patient advised to call pharmacy to see if medication has been shipped yet.

## 2024-07-12 NOTE — Telephone Encounter (Signed)
 Copied from CRM #[id number]. Topic: Clinical - Medication Question >> Jul 12, 2024  8:59 AM Lonell PEDLAR wrote:  Reason for CRM: Patient called regarding rx refill. Advised of 3 day turn around time. Patient is out of medications.

## 2024-07-14 ENCOUNTER — Other Ambulatory Visit: Payer: Self-pay

## 2024-07-14 ENCOUNTER — Other Ambulatory Visit (HOSPITAL_BASED_OUTPATIENT_CLINIC_OR_DEPARTMENT_OTHER): Payer: Self-pay

## 2024-07-17 ENCOUNTER — Other Ambulatory Visit: Payer: Self-pay

## 2024-07-17 ENCOUNTER — Other Ambulatory Visit (HOSPITAL_COMMUNITY): Payer: Self-pay

## 2024-07-18 ENCOUNTER — Other Ambulatory Visit (HOSPITAL_COMMUNITY): Payer: Self-pay

## 2024-07-18 ENCOUNTER — Other Ambulatory Visit: Payer: Self-pay

## 2024-07-18 NOTE — Telephone Encounter (Signed)
 Correction, it's 5-10 business days.

## 2024-07-18 NOTE — Telephone Encounter (Signed)
 Copied from CRM #[id number]. Topic: General - Other >> Jul 04, 2024 12:59 PM Amber H wrote:  Reason for CRM: Nat from [facility] [employer] called and stated she faxed over a request for patient to have a power wheelchair. I did advise that fax was received yesterday. Nat just wanted to know when it would be complete.    Nat- [phone] >> Jul 18, 2024  2:18 PM Hadassah PARAS wrote:  Nat is calling to fu on paperwork sent 12/15. Office has received but still awaiting response from PCP. Rerouting per CAL

## 2024-07-18 NOTE — Telephone Encounter (Signed)
 All paperwork has a 7-10 day wait period.

## 2024-07-21 ENCOUNTER — Other Ambulatory Visit: Payer: Self-pay

## 2024-07-26 ENCOUNTER — Other Ambulatory Visit: Payer: Self-pay | Admitting: Pharmacist

## 2024-07-26 ENCOUNTER — Other Ambulatory Visit: Payer: Self-pay

## 2024-07-26 ENCOUNTER — Other Ambulatory Visit (HOSPITAL_COMMUNITY): Payer: Self-pay

## 2024-07-26 MED ORDER — FREESTYLE LIBRE 3 READER DEVI: 0 refills | Status: AC

## 2024-07-26 MED ORDER — FREESTYLE LIBRE 3 PLUS SENSOR MISC: 6 refills | Status: AC

## 2024-07-27 NOTE — Telephone Encounter (Unsigned)
 Copied from CRM [id number]. Topic: General - Other >> Jul 27, 2024  9:57 AM Willma SAUNDERS wrote: Reason for CRM: Nat from [facility] [employer] called on 12/16 and 12/30 in regards to paperwork faxed over for the patient to receive a power wheelchair. Nat is looking for an update since it has been more than 10 business days from the original request.  Nat can be reached at [phone]

## 2024-08-01 NOTE — Telephone Encounter (Signed)
 Nat from [facility] [employer] called to follow up on fax submission a month ago. She has yet to receive correspondence, please advise   Best contact: [phone]

## 2024-08-02 ENCOUNTER — Other Ambulatory Visit: Payer: Self-pay

## 2024-08-02 NOTE — Telephone Encounter (Signed)
 Forms have been signed and faxed.

## 2024-08-04 ENCOUNTER — Telehealth: Payer: Self-pay | Admitting: Nurse Practitioner

## 2024-08-04 ENCOUNTER — Other Ambulatory Visit (HOSPITAL_COMMUNITY): Payer: Self-pay

## 2024-08-04 NOTE — Telephone Encounter (Signed)
 Noted

## 2024-08-04 NOTE — Telephone Encounter (Addendum)
 Copied from CRM [id number]. Topic: Clinical - Order For Equipment >> Aug 04, 2024  1:43 PM   Delon HERO wrote:  Reason for CRM: Note Nat from [facility] [employer] services called to follow up on fax submission sent yesterday via fax. Requesting documents to be signed and faxed Fax- [phone] Phone- [phone]

## 2024-08-04 NOTE — Telephone Encounter (Addendum)
 Fax received yesterday and placed in the providers box to complete.

## 2024-08-07 ENCOUNTER — Other Ambulatory Visit: Payer: Self-pay

## 2024-08-10 NOTE — Telephone Encounter (Signed)
 Nat from [facility] called to verify that fax was received. Verbalized understanding.

## 2024-08-15 NOTE — Telephone Encounter (Signed)
 Copied from CRM [id number]. Topic: General - Other >> Aug 15, 2024  2:57 PM   Lonell PEDLAR wrote:  Reason for CRM: Nat with [employer], called returning Priscilla's call. Please c/b at [phone]

## 2024-08-15 NOTE — Telephone Encounter (Signed)
 Form signed and faxed

## 2024-08-23 ENCOUNTER — Telehealth: Payer: Self-pay | Admitting: Nurse Practitioner

## 2024-08-23 NOTE — Telephone Encounter (Signed)
 I called the patient and he said that the tech from [facility] came out to see him and he was told he will be getting a new power wheelchair.  They didn't have a time frame for when he could expect it; but he was very happy with the news.  Then he said he has not received his [employer] sensors.  I told him that the prescription was sent to [facility] a few weeks ago and there were 6 refills with that order. He said he will contact [facility] again. I told him to please call this clinic back if he has further questions.

## 2024-08-23 NOTE — Telephone Encounter (Signed)
 Copied from CRM [id number]. Topic: General - Other >> Aug 23, 2024  3:55 PM   Myrick T wrote:  Reason for CRM: patient called requested call back from [employer] but declined to give a reason.

## 2024-10-30 ENCOUNTER — Ambulatory Visit: Admitting: Internal Medicine

## 2025-02-27 ENCOUNTER — Ambulatory Visit
